# Patient Record
Sex: Female | Born: 1955 | Race: White | Hispanic: No | Smoking: Former smoker
Health system: Southern US, Community
[De-identification: ages and names within clinical notes are randomized; demographics above are authoritative.]

## PROBLEM LIST (undated history)

## (undated) DIAGNOSIS — F419 Anxiety disorder, unspecified: Secondary | ICD-10-CM

## (undated) DIAGNOSIS — Z9889 Other specified postprocedural states: Secondary | ICD-10-CM

## (undated) DIAGNOSIS — E7800 Pure hypercholesterolemia, unspecified: Secondary | ICD-10-CM

## (undated) DIAGNOSIS — Z8489 Family history of other specified conditions: Secondary | ICD-10-CM

## (undated) DIAGNOSIS — Z87442 Personal history of urinary calculi: Secondary | ICD-10-CM

## (undated) DIAGNOSIS — G4730 Sleep apnea, unspecified: Secondary | ICD-10-CM

## (undated) DIAGNOSIS — K219 Gastro-esophageal reflux disease without esophagitis: Secondary | ICD-10-CM

## (undated) DIAGNOSIS — F32A Depression, unspecified: Secondary | ICD-10-CM

## (undated) DIAGNOSIS — R7303 Prediabetes: Secondary | ICD-10-CM

## (undated) DIAGNOSIS — I10 Essential (primary) hypertension: Secondary | ICD-10-CM

## (undated) DIAGNOSIS — M1990 Unspecified osteoarthritis, unspecified site: Secondary | ICD-10-CM

## (undated) DIAGNOSIS — T8859XA Other complications of anesthesia, initial encounter: Secondary | ICD-10-CM

## (undated) DIAGNOSIS — E039 Hypothyroidism, unspecified: Secondary | ICD-10-CM

## (undated) DIAGNOSIS — T4145XA Adverse effect of unspecified anesthetic, initial encounter: Secondary | ICD-10-CM

## (undated) DIAGNOSIS — M797 Fibromyalgia: Secondary | ICD-10-CM

## (undated) DIAGNOSIS — M719 Bursopathy, unspecified: Secondary | ICD-10-CM

## (undated) DIAGNOSIS — E079 Disorder of thyroid, unspecified: Secondary | ICD-10-CM

## (undated) DIAGNOSIS — R112 Nausea with vomiting, unspecified: Secondary | ICD-10-CM

## (undated) DIAGNOSIS — F329 Major depressive disorder, single episode, unspecified: Secondary | ICD-10-CM

## (undated) HISTORY — PX: TUBAL LIGATION: SHX77

## (undated) HISTORY — PX: NISSEN FUNDOPLICATION: SHX2091

## (undated) HISTORY — PX: CATARACT EXTRACTION, BILATERAL: SHX1313

## (undated) HISTORY — DX: Anxiety disorder, unspecified: F41.9

## (undated) HISTORY — DX: Depression, unspecified: F32.A

## (undated) HISTORY — PX: ABDOMINAL HYSTERECTOMY: SHX81

## (undated) HISTORY — DX: Major depressive disorder, single episode, unspecified: F32.9

## (undated) HISTORY — PX: NECK SURGERY: SHX720

## (undated) HISTORY — PX: CHOLECYSTECTOMY: SHX55

---

## 1999-09-04 ENCOUNTER — Encounter (INDEPENDENT_AMBULATORY_CARE_PROVIDER_SITE_OTHER): Payer: Self-pay

## 1999-09-04 ENCOUNTER — Other Ambulatory Visit: Admission: RE | Admit: 1999-09-04 | Discharge: 1999-09-04 | Payer: Self-pay | Admitting: Gastroenterology

## 1999-11-20 ENCOUNTER — Ambulatory Visit (HOSPITAL_COMMUNITY): Admission: RE | Admit: 1999-11-20 | Discharge: 1999-11-20 | Payer: Self-pay | Admitting: Gastroenterology

## 2000-01-04 ENCOUNTER — Ambulatory Visit (HOSPITAL_COMMUNITY): Admission: RE | Admit: 2000-01-04 | Discharge: 2000-01-04 | Payer: Self-pay | Admitting: Surgery

## 2000-01-04 ENCOUNTER — Encounter: Payer: Self-pay | Admitting: Surgery

## 2000-01-26 ENCOUNTER — Encounter: Payer: Self-pay | Admitting: Surgery

## 2000-01-30 ENCOUNTER — Inpatient Hospital Stay (HOSPITAL_COMMUNITY): Admission: RE | Admit: 2000-01-30 | Discharge: 2000-02-01 | Payer: Self-pay | Admitting: Surgery

## 2000-04-03 ENCOUNTER — Encounter: Payer: Self-pay | Admitting: Surgery

## 2000-04-03 ENCOUNTER — Ambulatory Visit (HOSPITAL_COMMUNITY): Admission: RE | Admit: 2000-04-03 | Discharge: 2000-04-03 | Payer: Self-pay | Admitting: Surgery

## 2008-09-29 ENCOUNTER — Ambulatory Visit: Payer: Self-pay | Admitting: Specialist

## 2008-12-09 ENCOUNTER — Ambulatory Visit (HOSPITAL_COMMUNITY): Admission: RE | Admit: 2008-12-09 | Discharge: 2008-12-10 | Payer: Self-pay | Admitting: Neurological Surgery

## 2009-01-04 ENCOUNTER — Encounter: Admission: RE | Admit: 2009-01-04 | Discharge: 2009-01-04 | Payer: Self-pay | Admitting: Neurological Surgery

## 2010-07-22 ENCOUNTER — Ambulatory Visit: Payer: Self-pay | Admitting: Rheumatology

## 2010-08-22 ENCOUNTER — Ambulatory Visit: Payer: Self-pay | Admitting: Pain Medicine

## 2010-11-23 DIAGNOSIS — E039 Hypothyroidism, unspecified: Secondary | ICD-10-CM | POA: Insufficient documentation

## 2011-01-08 DIAGNOSIS — I10 Essential (primary) hypertension: Secondary | ICD-10-CM | POA: Insufficient documentation

## 2011-02-26 LAB — DIFFERENTIAL
Eosinophils Absolute: 0.1 10*3/uL (ref 0.0–0.7)
Eosinophils Relative: 2 % (ref 0–5)
Lymphs Abs: 2.3 10*3/uL (ref 0.7–4.0)
Monocytes Absolute: 0.5 10*3/uL (ref 0.1–1.0)
Monocytes Relative: 8 % (ref 3–12)

## 2011-02-26 LAB — BASIC METABOLIC PANEL
CO2: 29 mEq/L (ref 19–32)
Chloride: 104 mEq/L (ref 96–112)
GFR calc Af Amer: >60 mL/min (ref >60)
Potassium: 5.1 mEq/L (ref 3.5–5.1)

## 2011-02-26 LAB — CBC
HCT: 44.5 % (ref 36.0–46.0)
Hemoglobin: 14.8 g/dL (ref 12.0–15.0)
MCHC: 33.2 g/dL (ref 30.0–36.0)
MCV: 93 fL (ref 78.0–100.0)
RBC: 4.78 MIL/uL (ref 3.87–5.11)
WBC: 6.6 10*3/uL (ref 4.0–10.5)

## 2011-02-26 LAB — PROTIME-INR: INR: 0.9 (ref 0.00–1.49)

## 2011-03-27 NOTE — Op Note (Signed)
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   PROCEDURE:  1. Anterior cervical re-exploration with removal of anterior cervical      plating C5 to C7 with exploration and fusion C5-6, C6-7.  2. Decompression of anterior cervical diskectomy C4-5 for central      canal and nerve root decompression.  3. Anterior cervical arthrodesis C4-5 utilizing a 7-mm      corticocancellous allograft.  4. Anterior cervical plating C4-5 utilizing Atlantis Vision plate.   SURGEON:  Tia Alert, MD   ASSISTANT:  Reinaldo Meeker, MD   ANESTHESIA:  General endotracheal.   COMPLICATIONS:  None apparent.   INDICATIONS FOR PROCEDURE:  Evelyn Bentley is a 55 year old female who  presented with severe neck pain with numbness in her arms.  She had MRI,  which showed she had a previous fusion at C5-6 and C6-7 by another  physician in the remote past.  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  I recommended a cervical  reexploration with a removal of the old plate followed by decompressive  anterior cervical diskectomy, fusion plating at C4-5.  She understood  the risks, benefits, and expected outcome and wished to proceed.   DESCRIPTION OF PROCEDURE:  The patient was taken to the operating room.  After induction of adequate generalized endotracheal  anesthesia, she was  placed in the supine position on the operating room table.  Her left  anterior cervical region was prepped with DuraPrep and then draped in  the usual sterile fashion.  A 5 mL of local anesthesia was injected, and  a transverse incision was made to the left of midline and carried down  to the platysma, which was elevated, opened, and undermined with  Metzenbaum scissors.  I then dissected a plane medial to the  sternocleidomastoid muscle and internal carotid artery and lateral to  the trachea and esophagus to expose C4-5 and the plate.  Blunt  dissection was used to get the soft tissues off of this anterior  cervical plate and locking screws were loosened and each of the 6 screws  were removed from the plate and the plate was then removed from C5 to C7  and the old fusion was explored.  To ensure solid fusion this appeared  to be a solid arthrodesis.  We then took down the longus colli muscles  and exposed  C4-5.  Checked this under fluoroscopy to confirm our level  at C4-5 and incised the disk space to perform the initial diskectomy  with pituitary rongeurs and curved curettes.  We then used the high-  speed drill to drill the endplates.  We drilled in a rectangular fashion  to prepare for later arthrodesis and to widen the disk space to 7 mm.  We drilled down to the level of the posterior spurs and then brought in  the operating microscope.  We opened the posterior longitudinal ligament  with a nerve hook, and then removed the posterior longitudinal ligament  while undercutting the bodies of C4 and C5 removing the posterior  osteophytes as we marched along the inferior plate of C4 and the  superior endplate of C5.  Bilateral foraminotomies were performed and C5  nerve roots were identified and decompressed distally in their foramina.  We could see the cord pulsatile through the dura.  We angled the scope  up and down to look up under the vertebral artery as best as we  could  and get them well decompressed under cutting the vertebral bodies.  We  then palpated in circumferential fashion with a nerve hook to assure  adequate  decompression.  We then irrigated with saline solution and  inspected surgical bed once again and then measured interspace to be 7  mm.  I used 7-mm corticocancellous allograft and tapped this into  position at C4-5.  We then used Atlantis Vision plate placed two 13-mm  variable angle screws in the bodies of C4 and C5 and these locked these  into position by locking mechanism on the plate.  We then irrigated with  saline solution containing bacitracin throughout all bleeding points,  and once meticulous hemostasis was achieved, closed the platysma with 3-  0 Vicryl, closing the subcuticular tissue with 3-0 Vicryl, and closed  the skin with Benzoin and Steri-Strips.  The drapes were removed.  A  sterile dressing was applied.  The patient was awakened from general  anesthesia and transferred to recovery room in stable condition.  At the  end of procedure, all sponge, needle, and instrument counts were  correct.      Tia Alert, MD  Electronically Signed     DSJ/MEDQ  D:  12/09/2008  T:  12/10/2008  Job:  [id number]

## 2011-03-30 NOTE — Procedures (Signed)
[place]  Patient:    Evelyn Bentley, Evelyn Bentley                        MRN: [id number] Proc. Date: 11/22/99 Attending:  Vania Rea. Jarold Motto, M.D. [facility] CC:         [place]. Daphine Deutscher, M.D.             Vania Rea. Jarold Motto, M.D. [facility]                           Procedure Report  PROCEDURE:  Esophageal manometry.  FINDINGS: 1. Upper esophageal sphincter--there appears to be normal    coordination between pharyngeal contraction and    cricopharyngeal relaxation. 2. Lower esophageal sphincter--mean pressure was approximately 15 mmHg with    normal relaxation with swallowing. 3. Motility pattern #3--there is normal peristalsis throughout    the length of the esophagus to both wet and dry swallows with    mean esophageal amplitude of 140 mmHg.  ASSESSMENT:  This is a normal esophageal manometry except for borderline incompetent lower esophageal sphincter, and I think she would be a good candidate for fundoplication surgery. DD:  11/22/99 TD:  11/23/99 Job: [id number]

## 2012-04-02 ENCOUNTER — Ambulatory Visit: Payer: Self-pay | Admitting: Internal Medicine

## 2013-07-01 ENCOUNTER — Ambulatory Visit: Payer: Self-pay | Admitting: Internal Medicine

## 2013-09-18 ENCOUNTER — Emergency Department: Payer: Self-pay | Admitting: Emergency Medicine

## 2013-11-12 HISTORY — PX: OTHER SURGICAL HISTORY: SHX169

## 2014-06-25 ENCOUNTER — Ambulatory Visit: Payer: Self-pay | Admitting: Internal Medicine

## 2014-07-13 ENCOUNTER — Encounter: Payer: Self-pay | Admitting: Internal Medicine

## 2014-08-12 ENCOUNTER — Encounter: Payer: Self-pay | Admitting: Internal Medicine

## 2014-11-19 ENCOUNTER — Ambulatory Visit: Payer: Self-pay

## 2014-12-28 ENCOUNTER — Ambulatory Visit: Payer: Self-pay | Admitting: Family Medicine

## 2015-03-06 DIAGNOSIS — Z8639 Personal history of other endocrine, nutritional and metabolic disease: Secondary | ICD-10-CM

## 2015-03-06 DIAGNOSIS — M797 Fibromyalgia: Secondary | ICD-10-CM | POA: Insufficient documentation

## 2015-03-06 DIAGNOSIS — Z8739 Personal history of other diseases of the musculoskeletal system and connective tissue: Secondary | ICD-10-CM

## 2015-03-06 HISTORY — DX: Personal history of other diseases of the musculoskeletal system and connective tissue: Z87.39

## 2015-03-06 HISTORY — DX: Personal history of other endocrine, nutritional and metabolic disease: Z86.39

## 2015-04-18 ENCOUNTER — Other Ambulatory Visit: Payer: Self-pay

## 2015-04-18 ENCOUNTER — Encounter: Payer: Self-pay | Admitting: Licensed Clinical Social Worker

## 2015-04-18 ENCOUNTER — Ambulatory Visit: Payer: BLUE CROSS/BLUE SHIELD | Admitting: Licensed Clinical Social Worker

## 2015-04-18 DIAGNOSIS — F419 Anxiety disorder, unspecified: Secondary | ICD-10-CM | POA: Insufficient documentation

## 2015-04-18 DIAGNOSIS — F331 Major depressive disorder, recurrent, moderate: Secondary | ICD-10-CM | POA: Insufficient documentation

## 2015-04-18 DIAGNOSIS — F411 Generalized anxiety disorder: Secondary | ICD-10-CM | POA: Insufficient documentation
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
## 2015-04-21 ENCOUNTER — Ambulatory Visit: Payer: BLUE CROSS/BLUE SHIELD | Admitting: Psychiatry

## 2015-04-21 ENCOUNTER — Ambulatory Visit: Payer: Self-pay | Admitting: Psychiatry

## 2015-05-02 ENCOUNTER — Other Ambulatory Visit: Payer: Self-pay

## 2015-05-02 ENCOUNTER — Ambulatory Visit: Payer: BLUE CROSS/BLUE SHIELD | Admitting: Psychiatry

## 2015-05-02 DIAGNOSIS — Z8669 Personal history of other diseases of the nervous system and sense organs: Secondary | ICD-10-CM

## 2015-05-02 HISTORY — DX: Personal history of other diseases of the nervous system and sense organs: Z86.69

## 2015-05-17 ENCOUNTER — Ambulatory Visit
Admission: RE | Admit: 2015-05-17 | Discharge: 2015-05-17 | Disposition: A | Discharge status: Home / Self Care | Payer: BLUE CROSS/BLUE SHIELD | Source: Ambulatory Visit | Attending: Nurse Practitioner | Admitting: Nurse Practitioner

## 2015-05-17 ENCOUNTER — Other Ambulatory Visit: Payer: Self-pay | Admitting: Nurse Practitioner

## 2015-05-17 DIAGNOSIS — T1490XA Injury, unspecified, initial encounter: Secondary | ICD-10-CM

## 2015-05-17 DIAGNOSIS — W19XXXA Unspecified fall, initial encounter: Secondary | ICD-10-CM | POA: Insufficient documentation

## 2015-05-17 DIAGNOSIS — S0592XA Unspecified injury of left eye and orbit, initial encounter: Secondary | ICD-10-CM

## 2015-05-17 DIAGNOSIS — H5712 Ocular pain, left eye: Secondary | ICD-10-CM | POA: Insufficient documentation

## 2015-05-17 DIAGNOSIS — G44311 Acute post-traumatic headache, intractable: Secondary | ICD-10-CM

## 2015-06-24 ENCOUNTER — Other Ambulatory Visit: Payer: Self-pay | Admitting: Internal Medicine

## 2015-06-24 DIAGNOSIS — Z1231 Encounter for screening mammogram for malignant neoplasm of breast: Secondary | ICD-10-CM

## 2015-06-28 ENCOUNTER — Ambulatory Visit
Admission: RE | Admit: 2015-06-28 | Discharge: 2015-06-28 | Disposition: A | Discharge status: Home / Self Care | Payer: BLUE CROSS/BLUE SHIELD | Source: Ambulatory Visit | Attending: Internal Medicine | Admitting: Internal Medicine

## 2015-06-28 DIAGNOSIS — Z1231 Encounter for screening mammogram for malignant neoplasm of breast: Secondary | ICD-10-CM | POA: Diagnosis present

## 2015-08-01 ENCOUNTER — Encounter: Payer: Self-pay | Admitting: Emergency Medicine

## 2015-08-01 ENCOUNTER — Ambulatory Visit
Admission: EM | Admit: 2015-08-01 | Discharge: 2015-08-01 | Disposition: A | Discharge status: Home / Self Care | Payer: BLUE CROSS/BLUE SHIELD | Attending: Family Medicine | Admitting: Family Medicine

## 2015-08-01 DIAGNOSIS — J0100 Acute maxillary sinusitis, unspecified: Secondary | ICD-10-CM | POA: Diagnosis not present

## 2015-08-01 DIAGNOSIS — J40 Bronchitis, not specified as acute or chronic: Secondary | ICD-10-CM

## 2015-08-01 HISTORY — DX: Disorder of thyroid, unspecified: E07.9

## 2015-08-01 HISTORY — DX: Gastro-esophageal reflux disease without esophagitis: K21.9

## 2015-08-01 HISTORY — DX: Pure hypercholesterolemia, unspecified: E78.00

## 2015-08-01 HISTORY — DX: Essential (primary) hypertension: I10

## 2015-08-01 MED ORDER — LORATADINE-PSEUDOEPHEDRINE ER 10-240 MG PO TB24: 1.0000 | ORAL_TABLET | Freq: Every day | ORAL | Status: DC

## 2015-08-01 MED ORDER — AMOXICILLIN-POT CLAVULANATE 875-125 MG PO TABS: 1.0000 | ORAL_TABLET | Freq: Two times a day (BID) | ORAL | Status: DC

## 2015-08-01 MED ORDER — ALBUTEROL SULFATE HFA 108 (90 BASE) MCG/ACT IN AERS: 2.0000 | INHALATION_SPRAY | RESPIRATORY_TRACT | Status: DC | PRN

## 2015-08-01 MED ORDER — HYDROCOD POLST-CPM POLST ER 10-8 MG/5ML PO SUER: 5.0000 mL | Freq: Two times a day (BID) | ORAL | Status: DC | PRN

## 2015-08-01 NOTE — ED Notes (Signed)
Patient c/o cough, chest congestion, and bilateral ear pain for 3 days.  Patient denies fevers.

## 2015-08-01 NOTE — ED Provider Notes (Signed)
CSN: [id number]     Arrival date & time 08/01/15  0750 History   First MD Initiated Contact with Patient 08/01/15 [phone]     Chief Complaint  Patient presents with  . Cough   (Consider location/radiation/quality/duration/timing/severity/associated sxs/prior Treatment) Patient is a 59 y.o. female presenting with cough. The history is provided by the patient. No language interpreter was used.  Cough Cough characteristics:  Productive Sputum characteristics:  Yellow and green Severity:  Moderate Duration:  3 days Timing:  Unable to specify Progression:  Worsening Chronicity:  New Smoker: no   Context: upper respiratory infection   Context: not animal exposure, not fumes, not occupational exposure, not sick contacts and not smoke exposure   Relieved by:  Nothing Ineffective treatments: antihistamines. Associated symptoms: ear fullness, ear pain, rhinorrhea, sinus congestion, sore throat and wheezing   Associated symptoms: no chest pain, no diaphoresis, no eye discharge, no rash, no shortness of breath and no weight loss   Risk factors: no chemical exposure, no recent infection and no recent travel     Past Medical History  Diagnosis Date  . Anxiety   . Depression   . Hypertension   . GERD (gastroesophageal reflux disease)   . Hypercholesteremia   . Thyroid disease    Past Surgical History  Procedure Laterality Date  . Cholecystectomy    . Neck surgery     Family History  Problem Relation Age of Onset  . Depression Mother    Social History  Substance Use Topics  . Smoking status: Former Research scientist (life sciences)  . Smokeless tobacco: None  . Alcohol Use: Yes   OB History    No data available     Review of Systems  Constitutional: Negative for weight loss and diaphoresis.  HENT: Positive for ear pain, rhinorrhea and sore throat.   Eyes: Negative for discharge.  Respiratory: Positive for cough and wheezing. Negative for shortness of breath.   Cardiovascular: Negative for chest pain.   Skin: Negative for rash.  All other systems reviewed and are negative.  patient does not smoke now. Hypertension. Nurse's notes were reviewed. Allergies  Review of patient's allergies indicates no known allergies.  Home Medications   Prior to Admission medications   Medication Sig Start Date End Date Taking? Authorizing Provider  atorvastatin (LIPITOR) 10 MG tablet Take 20 mg by mouth daily.   Yes Historical Provider, MD  albuterol (PROVENTIL HFA;VENTOLIN HFA) 108 (90 BASE) MCG/ACT inhaler Inhale 2 puffs into the lungs every 4 (four) hours as needed for wheezing or shortness of breath. 08/01/15   Frederich Cha, MD  ALPRAZolam Duanne Moron) 0.25 MG tablet Take 1 tablet by mouth at bedtime. 02/17/15   Historical Provider, MD  amoxicillin-clavulanate (AUGMENTIN) 875-125 MG per tablet Take 1 tablet by mouth 2 (two) times daily. 08/01/15   Frederich Cha, MD  ARIPiprazole (ABILIFY) 2 MG tablet Take 1 tablet by mouth daily. 02/17/15   Historical Provider, MD  chlorpheniramine-HYDROcodone (TUSSIONEX PENNKINETIC ER) 10-8 MG/5ML SUER Take 5 mLs by mouth every 12 (twelve) hours as needed for cough. 08/01/15   Frederich Cha, MD  escitalopram (LEXAPRO) 20 MG tablet Take 1 tablet by mouth every morning. 02/17/15   Historical Provider, MD  levothyroxine (SYNTHROID, LEVOTHROID) 125 MCG tablet  04/16/15   Historical Provider, MD  levothyroxine (SYNTHROID, LEVOTHROID) 150 MCG tablet Take by mouth.    Historical Provider, MD  lisinopril (PRINIVIL,ZESTRIL) 20 MG tablet Take by mouth.    Historical Provider, MD  loratadine-pseudoephedrine (CLARITIN-D 24 HOUR) 10-240 MG per  24 hr tablet Take 1 tablet by mouth daily. 08/01/15   Frederich Cha, MD  Omeprazole 20 MG TBEC Take by mouth.    Historical Provider, MD  topiramate (TOPAMAX) 50 MG tablet Take by mouth. 04/07/15   Historical Provider, MD   Meds Ordered and Administered this Visit  Medications - No data to display  BP 119/67 mmHg  Pulse 68  Temp(Src) 98 F (36.7 C) (Oral)  Resp  16  Ht 5' (1.524 m)  Wt 160 lb (72.576 kg)  BMI 31.25 kg/m2  SpO2 100% No data found.   Physical Exam  Constitutional: She is oriented to person, place, and time. She appears well-developed and well-nourished.  HENT:  Head: Normocephalic and atraumatic.  Right Ear: Hearing, tympanic membrane and external ear normal.  Left Ear: Hearing normal. Tympanic membrane is bulging.  Nose: Mucosal edema and rhinorrhea present. Right sinus exhibits maxillary sinus tenderness. Right sinus exhibits no frontal sinus tenderness. Left sinus exhibits maxillary sinus tenderness. Left sinus exhibits no frontal sinus tenderness.  Mouth/Throat: Normal dentition. No dental caries. Posterior oropharyngeal erythema present. No posterior oropharyngeal edema.  Eyes: Conjunctivae are normal. Pupils are equal, round, and reactive to light.  Neck: Normal range of motion. Neck supple. No tracheal deviation present.  Cardiovascular: Normal rate and regular rhythm.   Pulmonary/Chest: Effort normal and breath sounds normal. No respiratory distress.  Musculoskeletal: Normal range of motion.  Neurological: She is alert and oriented to person, place, and time. No cranial nerve deficit.  Skin: Skin is warm and dry.  Psychiatric: She has a normal mood and affect. Her behavior is normal.  Vitals reviewed.   ED Course  Procedures (including critical care time)  Labs Review Labs Reviewed - No data to display  Imaging Review No results found.   Visual Acuity Review  Right Eye Distance:   Left Eye Distance:   Bilateral Distance:    Right Eye Near:   Left Eye Near:    Bilateral Near:         MDM   1. Acute maxillary sinusitis, recurrence not specified   2. Bronchitis    Due to the progression of her illness is her on anabiotic. Augmentin 875 for 10 days Tussionex 1 teaspoon twice a day change Claritin to Claritin-D and for the wheezing albuterol inhaler to use as needed. Follow-up PCP in a week if not  better work note for today and tomorrow.   Frederich Cha, MD 08/01/15 [phone]

## 2015-08-01 NOTE — Discharge Instructions (Signed)
Upper Respiratory Infection, Adult An upper respiratory infection (URI) is also known as the common cold. It is often caused by a type of germ (virus). Colds are easily spread (contagious). You can pass it to others by kissing, coughing, sneezing, or drinking out of the same glass. Usually, you get better in 1 or 2 weeks.  HOME CARE   Only take medicine as told by your doctor.  Use a warm mist humidifier or breathe in steam from a hot shower.  Drink enough water and fluids to keep your pee (urine) clear or pale yellow.  Get plenty of rest.  Return to work when your temperature is back to normal or as told by your doctor. You may use a face mask and wash your hands to stop your cold from spreading. GET HELP RIGHT AWAY IF:   After the first few days, you feel you are getting worse.  You have questions about your medicine.  You have chills, shortness of breath, or brown or red spit (mucus).  You have yellow or brown snot (nasal discharge) or pain in the face, especially when you bend forward.  You have a fever, puffy (swollen) neck, pain when you swallow, or white spots in the back of your throat.  You have a bad headache, ear pain, sinus pain, or chest pain.  You have a high-pitched whistling sound when you breathe in and out (wheezing).  You have a lasting cough or cough up blood.  You have sore muscles or a stiff neck. MAKE SURE YOU:   Understand these instructions.  Will watch your condition.  Will get help right away if you are not doing well or get worse. Document Released: 04/16/2008 Document Revised: 01/21/2012 Document Reviewed: 02/03/2014 [facility] Patient Information 2015 [place], [place]. This information is not intended to replace advice given to you by your health care provider. Make sure you discuss any questions you have with your health care provider.  General Headache Without Cause A headache is pain or discomfort felt around the head or neck area. The specific  cause of a headache may not be found. There are many causes and types of headaches. A few common ones are:  Tension headaches.  Migraine headaches.  Cluster headaches.  Chronic daily headaches. HOME CARE INSTRUCTIONS   Keep all follow-up appointments with your caregiver or any specialist referral.  Only take over-the-counter or prescription medicines for pain or discomfort as directed by your caregiver.  Lie down in a dark, quiet room when you have a headache.  Keep a headache journal to find out what may trigger your migraine headaches. For example, write down:  What you eat and drink.  How much sleep you get.  Any change to your diet or medicines.  Try massage or other relaxation techniques.  Put ice packs or heat on the head and neck. Use these 3 to 4 times per day for 15 to 20 minutes each time, or as needed.  Limit stress.  Sit up straight, and do not tense your muscles.  Quit smoking if you smoke.  Limit alcohol use.  Decrease the amount of caffeine you drink, or stop drinking caffeine.  Eat and sleep on a regular schedule.  Get 7 to 9 hours of sleep, or as recommended by your caregiver.  Keep lights dim if bright lights bother you and make your headaches worse. SEEK MEDICAL CARE IF:   You have problems with the medicines you were prescribed.  Your medicines are not working.  You have a change from the usual headache.  You have nausea or vomiting. SEEK IMMEDIATE MEDICAL CARE IF:   Your headache becomes severe.  You have a fever.  You have a stiff neck.  You have loss of vision.  You have muscular weakness or loss of muscle control.  You start losing your balance or have trouble walking.  You feel faint or pass out.  You have severe symptoms that are different from your first symptoms. MAKE SURE YOU:   Understand these instructions.  Will watch your condition.  Will get help right away if you are not doing well or get worse. Document  Released: 10/29/2005 Document Revised: 01/21/2012 Document Reviewed: 11/14/2011 [facility] Patient Information 2015 [place], [place]. This information is not intended to replace advice given to you by your health care provider. Make sure you discuss any questions you have with your health care provider.  Sinusitis Sinusitis is redness, soreness, and puffiness (inflammation) of the air pockets in the bones of your face (sinuses). The redness, soreness, and puffiness can cause air and mucus to get trapped in your sinuses. This can allow germs to grow and cause an infection.  HOME CARE   Drink enough fluids to keep your pee (urine) clear or pale yellow.  Use a humidifier in your home.  Run a hot shower to create steam in the bathroom. Sit in the bathroom with the door closed. Breathe in the steam 3-4 times a day.  Put a warm, moist washcloth on your face 3-4 times a day, or as told by your doctor.  Use salt water sprays (saline sprays) to wet the thick fluid in your nose. This can help the sinuses drain.  Only take medicine as told by your doctor. GET HELP RIGHT AWAY IF:   Your pain gets worse.  You have very bad headaches.  You are sick to your stomach (nauseous).  You throw up (vomit).  You are very sleepy (drowsy) all the time.  Your face is puffy (swollen).  Your vision changes.  You have a stiff neck.  You have trouble breathing. MAKE SURE YOU:   Understand these instructions.  Will watch your condition.  Will get help right away if you are not doing well or get worse. Document Released: 04/16/2008 Document Revised: 07/23/2012 Document Reviewed: 06/03/2012 [facility] Patient Information 2015 [place], [place]. This information is not intended to replace advice given to you by your health care provider. Make sure you discuss any questions you have with your health care provider. Acute Bronchitis Bronchitis is when the airways that extend from the windpipe into the lungs get  red, puffy, and painful (inflamed). Bronchitis often causes thick spit (mucus) to develop. This leads to a cough. A cough is the most common symptom of bronchitis. In acute bronchitis, the condition usually begins suddenly and goes away over time (usually in 2 weeks). Smoking, allergies, and asthma can make bronchitis worse. Repeated episodes of bronchitis may cause more lung problems. HOME CARE  Rest.  Drink enough fluids to keep your pee (urine) clear or pale yellow (unless you need to limit fluids as told by your doctor).  Only take over-the-counter or prescription medicines as told by your doctor.  Avoid smoking and secondhand smoke. These can make bronchitis worse. If you are a smoker, think about using nicotine gum or skin patches. Quitting smoking will help your lungs heal faster.  Reduce the chance of getting bronchitis again by:  Washing your hands often.  Avoiding people with  cold symptoms.  Trying not to touch your hands to your mouth, nose, or eyes.  Follow up with your doctor as told. GET HELP IF: Your symptoms do not improve after 1 week of treatment. Symptoms include:  Cough.  Fever.  Coughing up thick spit.  Body aches.  Chest congestion.  Chills.  Shortness of breath.  Sore throat. GET HELP RIGHT AWAY IF:   You have an increased fever.  You have chills.  You have severe shortness of breath.  You have bloody thick spit (sputum).  You throw up (vomit) often.  You lose too much body fluid (dehydration).  You have a severe headache.  You faint. MAKE SURE YOU:   Understand these instructions.  Will watch your condition.  Will get help right away if you are not doing well or get worse. Document Released: 04/16/2008 Document Revised: 07/01/2013 Document Reviewed: 04/21/2013 [facility] Patient Information 2015 [place], [place]. This information is not intended to replace advice given to you by your health care provider. Make sure you discuss any  questions you have with your health care provider.

## 2015-08-18 ENCOUNTER — Ambulatory Visit
Admission: EM | Admit: 2015-08-18 | Discharge: 2015-08-18 | Disposition: A | Discharge status: Home / Self Care | Payer: BLUE CROSS/BLUE SHIELD | Attending: Family Medicine | Admitting: Family Medicine

## 2015-08-18 DIAGNOSIS — N390 Urinary tract infection, site not specified: Secondary | ICD-10-CM

## 2015-08-18 LAB — URINALYSIS COMPLETE WITH MICROSCOPIC (ARMC ONLY)
Bilirubin Urine: NEGATIVE
Glucose, UA: NEGATIVE mg/dL
KETONES UR: NEGATIVE mg/dL
Nitrite: NEGATIVE
PH: 6 (ref 5.0–8.0)
PROTEIN: NEGATIVE mg/dL
Specific Gravity, Urine: 1.01 (ref 1.005–1.030)

## 2015-08-18 MED ORDER — NITROFURANTOIN MONOHYD MACRO 100 MG PO CAPS: 100.0000 mg | ORAL_CAPSULE | Freq: Two times a day (BID) | ORAL | Status: DC

## 2015-08-18 NOTE — ED Notes (Signed)
Pt states "I feel like I'm not voiding hardly at all, for the last three days."

## 2015-08-18 NOTE — ED Notes (Signed)
"  Pain when I do void."

## 2015-08-18 NOTE — Discharge Instructions (Signed)
Take medication as prescribed. Rest. Drink water. Eat and drink regularly. Follow up with your primary care physician as needed. Return to Urgent care for new or worsening concerns.   Urinary Tract Infection A urinary tract infection (UTI) can occur any place along the urinary tract. The tract includes the kidneys, ureters, bladder, and urethra. A type of germ called bacteria often causes a UTI. UTIs are often helped with antibiotic medicine.  HOME CARE   If given, take antibiotics as told by your doctor. Finish them even if you start to feel better.  Drink enough fluids to keep your pee (urine) clear or pale yellow.  Avoid tea, drinks with caffeine, and bubbly (carbonated) drinks.  Pee often. Avoid holding your pee in for a long time.  Pee before and after having sex (intercourse).  Wipe from front to back after you poop (bowel movement) if you are a woman. Use each tissue only once. GET HELP RIGHT AWAY IF:   You have back pain.  You have lower belly (abdominal) pain.  You have chills.  You feel sick to your stomach (nauseous).  You throw up (vomit).  Your burning or discomfort with peeing does not go away.  You have a fever.  Your symptoms are not better in 3 days. MAKE SURE YOU:   Understand these instructions.  Will watch your condition.  Will get help right away if you are not doing well or get worse.   This information is not intended to replace advice given to you by your health care provider. Make sure you discuss any questions you have with your health care provider.   Document Released: 04/16/2008 Document Revised: 11/19/2014 Document Reviewed: 05/29/2012 Elsevier Interactive Patient Education [employer].

## 2015-08-18 NOTE — ED Provider Notes (Signed)
[facility] Emergency Department Provider Note  ____________________________________________  Time seen: Approximately 7:57 AM  I have reviewed the triage vital signs and the nursing notes.   HISTORY  Chief Complaint Urinary Retention   HPI Evelyn Bentley is a 59 y.o. female presents for complaints of urinary frequency, urgency, burning with urination. Patient states she feels that she often can not fully empty her bladder as she has the urge to urinate again soon. States occasional low back discomfort described as an ache but not a pain. States these symptoms present x 3 days. States hx of UTI in past and feels similar.   Denies fever, nausea, vomiting, diarrhea, constipation, abdominal pain, chest pain, shortness of breath or other complaints.  Reports continues to eat and drink well.    Past Medical History  Diagnosis Date  . Anxiety   . Depression   . Hypertension   . GERD (gastroesophageal reflux disease)   . Hypercholesteremia   . Thyroid disease     Patient Active Problem List   Diagnosis Date Noted  . History of migraine headaches 05/02/2015  . Anxiety 04/18/2015  . Depression, major, recurrent, moderate ([place]) 04/18/2015  . H/O: hypothyroidism 03/06/2015  . Fibromyalgia 03/06/2015  . H/O arthritis 03/06/2015    Past Surgical History  Procedure Laterality Date  . Cholecystectomy    . Neck surgery      Current Outpatient Rx  Name  Route  Sig  Dispense  Refill  .           .           . ARIPiprazole (ABILIFY) 2 MG tablet   Oral   Take 1 tablet by mouth daily.         Marland Kitchen atorvastatin (LIPITOR) 10 MG tablet   Oral   Take 20 mg by mouth daily.         Marland Kitchen escitalopram (LEXAPRO) 20 MG tablet   Oral   Take 1 tablet by mouth every morning.         Marland Kitchen levothyroxine (SYNTHROID, LEVOTHROID) 125 MCG tablet            1   . lisinopril (PRINIVIL,ZESTRIL) 20 MG tablet   Oral   Take by mouth.         .           . Omeprazole  20 MG TBEC   Oral   Take by mouth.         .           .           .           .             Allergies Review of patient's allergies indicates no known allergies.  Family History  Problem Relation Age of Onset  . Depression Mother     Social History Social History  Substance Use Topics  . Smoking status: Former Research scientist (life sciences)  . Smokeless tobacco: None  . Alcohol Use: Yes    Review of Systems Constitutional: No fever/chills Eyes: No visual changes. ENT: No sore throat. Cardiovascular: Denies chest pain. Respiratory: Denies shortness of breath. Gastrointestinal: No abdominal pain.  No nausea, no vomiting.  No diarrhea.  No constipation. Genitourinary: positive for dysuria. Musculoskeletal: Negative for back pain. Skin: Negative for rash. Neurological: Negative for headaches, focal weakness or numbness.  10-point ROS otherwise negative.  ____________________________________________   PHYSICAL EXAM:  VITAL SIGNS:  ED Triage Vitals  Enc Vitals Group     BP 08/18/15 0742 131/68 mmHg     Pulse Rate 08/18/15 0742 80     Resp 08/18/15 0742 16     Temp 08/18/15 0742 97.8 F (36.6 C)     Temp Source 08/18/15 0742 Oral     SpO2 08/18/15 0742 100 %     Weight 08/18/15 0742 158 lb (71.668 kg)     Height 08/18/15 0742 5' (1.524 m)     Head Cir --      Peak Flow --      Pain Score 08/18/15 0746 2     Pain Loc --      Pain Edu? --      Excl. in [place]? --     Constitutional: Alert and oriented. Well appearing and in no acute distress. Eyes: Conjunctivae are normal. PERRL. EOMI. Head: Atraumatic.  Nose: No congestion/rhinnorhea.  Mouth/Throat: Mucous membranes are moist.  Neck: No stridor.  No cervical spine tenderness to palpation. Hematological/Lymphatic/Immunilogical: No cervical lymphadenopathy. Cardiovascular: Normal rate, regular rhythm. Grossly normal heart sounds.  Good peripheral circulation. Respiratory: Normal respiratory effort.  No retractions. Lungs  CTAB. Gastrointestinal: Soft and nontender. No distention. Normal Bowel sounds.  No abdominal bruits. No CVA tenderness. Musculoskeletal: No lower or upper extremity tenderness nor edema.  No cervical, thoracic or lumbar TTP. Changes positions quickly without difficulty or distress. Steady gait.  Neurologic:  Normal speech and language. No gross focal neurologic deficits are appreciated. No gait instability. Skin:  Skin is warm, dry and intact. No rash noted. Psychiatric: Mood and affect are normal. Speech and behavior are normal.  ____________________________________________   LABS (all labs ordered are listed, but only abnormal results are displayed)  Labs Reviewed  URINALYSIS COMPLETEWITH MICROSCOPIC ([place]) - Abnormal; Notable for the following:    Color, Urine STRAW (*)    APPearance HAZY (*)    Hgb urine dipstick 2+ (*)    Leukocytes, UA 3+ (*)    Bacteria, UA MANY (*)    Squamous Epithelial / LPF 0-5 (*)    All other components within normal limits  URINE CULTURE     INITIAL IMPRESSION / ASSESSMENT AND PLAN / ED COURSE  Pertinent labs & imaging results that were available during my care of the patient were reviewed by me and considered in my medical decision making (see chart for details).  Very well appearing. No acute distress. Presents for x 3 days of dysuria. Abdomen soft and nontender, no CVA tenderness. Reports continues to eat and drink well. Will evaluate urine. Reports postmenopausal.  Urinalysis positive for many bacteria, too numerus to count WBCs, 3+ leukocytes, and hazy. Will treat urinary tract infection with macrobid, and supportive treatments including rest and fluids. Discussed follow up with Primary care physician this week. Discussed follow up and return parameters including no resolution or any worsening concerns. Patient verbalized understanding and agreed to plan.   ___________________________________________   FINAL CLINICAL IMPRESSION(S) / ED  DIAGNOSES  Final diagnoses:  UTI (lower urinary tract infection)       Marylene Land, NP 08/18/15 [phone]

## 2015-08-20 LAB — URINE CULTURE: Culture: >=100000

## 2015-11-15 ENCOUNTER — Ambulatory Visit (INDEPENDENT_AMBULATORY_CARE_PROVIDER_SITE_OTHER): Payer: BLUE CROSS/BLUE SHIELD | Admitting: Psychiatry

## 2015-11-15 ENCOUNTER — Encounter: Payer: Self-pay | Admitting: Psychiatry

## 2015-11-15 VITALS — BP 122/78 | HR 94 | Temp 97.4°F | Ht 60.0 in | Wt 166.0 lb

## 2015-11-15 DIAGNOSIS — F4329 Adjustment disorder with other symptoms: Secondary | ICD-10-CM

## 2015-11-15 DIAGNOSIS — F4321 Adjustment disorder with depressed mood: Secondary | ICD-10-CM | POA: Diagnosis not present

## 2015-11-15 DIAGNOSIS — F331 Major depressive disorder, recurrent, moderate: Secondary | ICD-10-CM | POA: Diagnosis not present

## 2015-11-15 DIAGNOSIS — Z634 Disappearance and death of family member: Secondary | ICD-10-CM

## 2015-11-15 MED ORDER — ESCITALOPRAM OXALATE 20 MG PO TABS: 20.0000 mg | ORAL_TABLET | ORAL | Status: DC

## 2015-11-15 MED ORDER — ALPRAZOLAM 0.25 MG PO TABS: 0.2500 mg | ORAL_TABLET | Freq: Every day | ORAL | Status: DC

## 2015-11-15 MED ORDER — TOPIRAMATE 50 MG PO TABS: 50.0000 mg | ORAL_TABLET | Freq: Every day | ORAL | Status: DC

## 2015-11-15 MED ORDER — ARIPIPRAZOLE 2 MG PO TABS: 2.0000 mg | ORAL_TABLET | Freq: Every day | ORAL | Status: DC

## 2015-11-15 NOTE — Progress Notes (Signed)
BH MD/PA/NP OP Progress Note  11/15/2015 3:43 PM Evelyn Bentley  MRN:  [id number]  Subjective:    Patient is a 60 year old widowed female who presented for the follow-up appointment. She reported that she was feeling depressed on the holidays due to the death of her husband and son couple of years ago. She felt it more around the holidays. She spent the holidays with her other son and his family. She appeared depressed during the interview. She reported that she has been spending time during crochet and with her family members. She reported that she used to go to the gym but now she is feeling tired. She is planning to start doing it again. Patient reported that her thyroid disease was not under control and now her endocrinologist is adjusting her medications. She takes alprazolam at bedtime to help with the sleep. Patient currently denied having any suicidal ideations or plans. She reported that she takes Abilify in the morning. She also takes Lexapro on a regular basis. She appeared well-groomed as usual. She denied having any perceptual disturbances. She denied use of drugs or alcohol at this time.   Chief Complaint:  Chief Complaint    Follow-up; Medication Refill     Visit Diagnosis:     ICD-9-CM ICD-10-CM   1. MDD (major depressive disorder), recurrent episode, moderate (HCC) 296.32 F33.1   2. 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 Anxiety   . Depression   . Hypertension   . GERD (gastroesophageal reflux disease)   . Hypercholesteremia   . Thyroid disease     Past Surgical History  Procedure Laterality Date  . Cholecystectomy    . Neck surgery     Family History:  Family History  Problem Relation Age of Onset  . Depression Mother    Social History:  Social History   Social History  . Marital Status: Married    Spouse Name: N/A  . Number of Children: N/A  . Years of Education: N/A   Social History  Main Topics  . Smoking status: Former Smoker    Start date: 11/14/1974    Quit date: 05/14/2014  . Smokeless tobacco: Never Used  . Alcohol Use: 0.0 - 0.6 oz/week    0 Standard drinks or equivalent, 0-1 Glasses of wine, 0 Cans of beer, 0 Shots of liquor per week  . Drug Use: No  . Sexual Activity: Not Currently   Other Topics Concern  . None   Social History Narrative   Additional History:   Patient is currently widowed and is living with her family members.  Assessment:   Musculoskeletal: Strength & Muscle Tone: within normal limits Gait & Station: normal Patient leans: N/A  Psychiatric Specialty Exam: HPI  ROS  Blood pressure 122/78, pulse 94, temperature 97.4 F (36.3 C), temperature source Tympanic, height 5' (1.524 m), weight 166 lb (75.297 kg), SpO2 97 %.Body mass index is 32.42 kg/(m^2).  General Appearance: Casual  Eye Contact:  Fair  Speech:  Clear and Coherent and Normal Rate  Volume:  Normal  Mood:  Anxious and Depressed  Affect:  Appropriate  Thought Process:  Coherent and Goal Directed  Orientation:  Full (Time, Place, and Person)  Thought Content:  WDL  Suicidal Thoughts:  No  Homicidal Thoughts:  No  Memory:  Immediate;   Fair  Judgement:  Fair  Insight:  Fair  Psychomotor Activity:  Normal  Concentration:  Fair  Recall:  Smiley Houseman of Knowledge: Fair  Language: Fair  Akathisia:  No  Handed:  Right  AIMS (if indicated):    Assets:  Communication Skills Desire for Improvement Physical Health Social Support  ADL's:  Intact  Cognition: WNL  Sleep:     Is the patient at risk to self?  No. Has the patient been a risk to self in the past 6 months?  No. Has the patient been a risk to self within the distant past?  No. Is the patient a risk to others?  No. Has the patient been a risk to others in the past 6 months?  No. Has the patient been a risk to others within the distant past?  No.  Current Medications: Current Outpatient Prescriptions   Medication Sig Dispense Refill  . albuterol (PROVENTIL HFA;VENTOLIN HFA) 108 (90 BASE) MCG/ACT inhaler Inhale 2 puffs into the lungs every 4 (four) hours as needed for wheezing or shortness of breath. 1 Inhaler 1  . ALPRAZolam (XANAX) 0.25 MG tablet Take 1 tablet by mouth at bedtime.    Marland Kitchen amoxicillin-clavulanate (AUGMENTIN) 875-125 MG per tablet Take 1 tablet by mouth 2 (two) times daily. 20 tablet 0  . ARIPiprazole (ABILIFY) 2 MG tablet Take 1 tablet by mouth daily.    Marland Kitchen atorvastatin (LIPITOR) 10 MG tablet Take 20 mg by mouth daily.    Marland Kitchen escitalopram (LEXAPRO) 20 MG tablet Take 1 tablet by mouth every morning.    . fluticasone (FLONASE) 50 MCG/ACT nasal spray Place into the nose.    . gabapentin (NEURONTIN) 300 MG capsule Take by mouth.    . levothyroxine (SYNTHROID, LEVOTHROID) 150 MCG tablet Take by mouth.    Marland Kitchen lisinopril (PRINIVIL,ZESTRIL) 20 MG tablet Take by mouth.    Marland Kitchen lisinopril-hydrochlorothiazide (PRINZIDE,ZESTORETIC) 10-12.5 MG tablet   2  . loratadine-pseudoephedrine (CLARITIN-D 24 HOUR) 10-240 MG per 24 hr tablet Take 1 tablet by mouth daily. 30 tablet 0  . meloxicam (MOBIC) 15 MG tablet Take by mouth.    . Omeprazole 20 MG TBEC Take by mouth.    . oxyCODONE-acetaminophen (PERCOCET/ROXICET) 5-325 MG tablet Take by mouth.    . topiramate (TOPAMAX) 50 MG tablet Take by mouth.    . Vitamin D, Ergocalciferol, (DRISDOL) 50000 units CAPS capsule   2   No current facility-administered medications for this visit.    Medical Decision Making:  Review of Psycho-Social Stressors (1)  Treatment Plan Summary:Medication management  She will continue on Lexapro 20 mg daily She will continue on Abilify 2 mg at bedtime She will continue on alprazolam 0.25 mg at bedtime Patient was given 3 month supply of the medications  Discussed with her about the adverse effects of the medication in detail and she demonstrated understanding Follow-up in 3 months or earlier    Rainey Pines, MD     11/15/2015, 3:43 PM

## 2016-02-14 ENCOUNTER — Encounter: Payer: Self-pay | Admitting: Psychiatry

## 2016-02-14 ENCOUNTER — Ambulatory Visit (INDEPENDENT_AMBULATORY_CARE_PROVIDER_SITE_OTHER): Payer: BLUE CROSS/BLUE SHIELD | Admitting: Psychiatry

## 2016-02-14 VITALS — BP 138/98 | HR 83 | Temp 97.8°F | Ht 60.0 in | Wt 164.0 lb

## 2016-02-14 DIAGNOSIS — F331 Major depressive disorder, recurrent, moderate: Secondary | ICD-10-CM

## 2016-02-14 MED ORDER — ALPRAZOLAM 0.25 MG PO TABS: 0.2500 mg | ORAL_TABLET | Freq: Every day | ORAL | Status: DC

## 2016-02-14 MED ORDER — ESCITALOPRAM OXALATE 20 MG PO TABS
20.0000 mg | ORAL_TABLET | ORAL | Status: DC
Start: 2016-02-14 — End: 2016-11-15

## 2016-02-14 MED ORDER — ESCITALOPRAM OXALATE 20 MG PO TABS: 20.0000 mg | ORAL_TABLET | ORAL | Status: DC

## 2016-02-14 MED ORDER — ARIPIPRAZOLE 2 MG PO TABS: 2.0000 mg | ORAL_TABLET | Freq: Every day | ORAL | Status: DC

## 2016-02-14 NOTE — Progress Notes (Signed)
BH MD/PA/NP OP Progress Note  02/14/2016 3:41 PM Evelyn Bentley  MRN:  [id number]  Subjective:    Patient is a 60 year old widowed female who presented for the follow-up appointment. She reported that she is doing well and has been compliant with her medications. She reported that she has started doing crochet and has been keeping herself busy. She is going to have the hysterectomy done on April 18 due to abnormal Pap smears. She reported that she is planning  to go on vacation with her family next week.  She is looking forward to the vacation. . Patient reported that she takes Xanax on a when necessary basis. She sleeps well with the medications. She denied having any adverse effects of the medications. She denied having any more swings anger anxiety or paranoia.  Patient currently denied having any suicidal ideations or plans. She reported that she takes Abilify in the morningl. She denied having any perceptual disturbances. She denied use of drugs or alcohol at this time.   Chief Complaint:  Chief Complaint    Follow-up; Medication Refill     Visit Diagnosis:     ICD-9-CM ICD-10-CM   1. MDD (major depressive disorder), recurrent episode, moderate ([place]) 296.32 F33.1     Past Medical History:  Past Medical History  Diagnosis Date  . Anxiety   . Depression   . Hypertension   . GERD (gastroesophageal reflux disease)   . Hypercholesteremia   . Thyroid disease     Past Surgical History  Procedure Laterality Date  . Cholecystectomy    . Neck surgery     Family History:  Family History  Problem Relation Age of Onset  . Depression Mother   . Anxiety disorder Mother    Social History:  Social History   Social History  . Marital Status: Widowed    Spouse Name: N/A  . Number of Children: N/A  . Years of Education: N/A   Social History Main Topics  . Smoking status: Former Smoker    Start date: 11/14/1974    Quit date: 05/14/2014  . Smokeless tobacco: Never Used  .  Alcohol Use: 0.0 - 0.6 oz/week    0-1 Glasses of wine, 0 Cans of beer, 0 Shots of liquor, 0 Standard drinks or equivalent per week  . Drug Use: No  . Sexual Activity: Not Currently   Other Topics Concern  . None   Social History Narrative   Additional History:   Patient is currently widowed and is living with her family members.  Assessment:   Musculoskeletal: Strength & Muscle Tone: within normal limits Gait & Station: normal Patient leans: N/A  Psychiatric Specialty Exam: HPI  ROS  Blood pressure 138/98, pulse 83, temperature 97.8 F (36.6 C), temperature source Tympanic, height 5' (1.524 m), weight 164 lb (74.39 kg), SpO2 95 %.Body mass index is 32.03 kg/(m^2).  General Appearance: Casual  Eye Contact:  Fair  Speech:  Clear and Coherent and Normal Rate  Volume:  Normal  Mood:  Anxious and Depressed  Affect:  Appropriate  Thought Process:  Coherent and Goal Directed  Orientation:  Full (Time, Place, and Person)  Thought Content:  WDL  Suicidal Thoughts:  No  Homicidal Thoughts:  No  Memory:  Immediate;   Fair  Judgement:  Fair  Insight:  Fair  Psychomotor Activity:  Normal  Concentration:  Fair  Recall:  [employer] of Knowledge: Fair  Language: Fair  Akathisia:  No  Handed:  Right  AIMS (  if indicated):    Assets:  Communication Skills Desire for Improvement Physical Health Social Support  ADL's:  Intact  Cognition: WNL  Sleep:     Is the patient at risk to self?  No. Has the patient been a risk to self in the past 6 months?  No. Has the patient been a risk to self within the distant past?  No. Is the patient a risk to others?  No. Has the patient been a risk to others in the past 6 months?  No. Has the patient been a risk to others within the distant past?  No.  Current Medications: Current Outpatient Prescriptions  Medication Sig Dispense Refill  . ALPRAZolam (XANAX) 0.25 MG tablet Take 1 tablet (0.25 mg total) by mouth at bedtime. 30 tablet 2  .  ARIPiprazole (ABILIFY) 2 MG tablet Take 1 tablet (2 mg total) by mouth daily. 30 tablet 2  . atorvastatin (LIPITOR) 10 MG tablet Take 20 mg by mouth daily.    Marland Kitchen escitalopram (LEXAPRO) 20 MG tablet Take 1 tablet (20 mg total) by mouth every morning. 30 tablet 2  . fluticasone (FLONASE) 50 MCG/ACT nasal spray Place into the nose.    . gabapentin (NEURONTIN) 300 MG capsule Take by mouth.    . levothyroxine (SYNTHROID, LEVOTHROID) 175 MCG tablet   10  . lisinopril-hydrochlorothiazide (PRINZIDE,ZESTORETIC) 10-12.5 MG tablet   2  . meloxicam (MOBIC) 15 MG tablet Take by mouth.    Marland Kitchen omeprazole (PRILOSEC) 40 MG capsule   2  . oxyCODONE-acetaminophen (PERCOCET/ROXICET) 5-325 MG tablet Take by mouth.    . Vitamin D, Ergocalciferol, (DRISDOL) 50000 units CAPS capsule   2   No current facility-administered medications for this visit.    Medical Decision Making:  Review of Psycho-Social Stressors (1)  Treatment Plan Summary:Medication management  She will continue on Lexapro 20 mg daily She will continue on Abilify 2 mg at bedtime She will continue on alprazolam 0.25 mg at bedtime Patient was given 2 month supply of the medications  Discussed with her about the adverse effects of the medication in detail and she demonstrated understanding Follow-up in 2 months or earlier    Rainey Pines, MD    02/14/2016, 3:41 PM

## 2016-02-17 ENCOUNTER — Encounter
Admission: RE | Admit: 2016-02-17 | Discharge: 2016-02-17 | Disposition: A | Discharge status: Home / Self Care | Payer: BLUE CROSS/BLUE SHIELD | Source: Ambulatory Visit | Attending: Obstetrics & Gynecology | Admitting: Obstetrics & Gynecology

## 2016-02-17 DIAGNOSIS — Z01810 Encounter for preprocedural cardiovascular examination: Secondary | ICD-10-CM | POA: Insufficient documentation

## 2016-02-17 DIAGNOSIS — Z01812 Encounter for preprocedural laboratory examination: Secondary | ICD-10-CM | POA: Diagnosis present

## 2016-02-17 HISTORY — DX: Hypothyroidism, unspecified: E03.9

## 2016-02-17 HISTORY — DX: Unspecified osteoarthritis, unspecified site: M19.90

## 2016-02-17 HISTORY — DX: Fibromyalgia: M79.7

## 2016-02-17 HISTORY — DX: Bursopathy, unspecified: M71.9

## 2016-02-17 HISTORY — DX: Other complications of anesthesia, initial encounter: T88.59XA

## 2016-02-17 HISTORY — DX: Adverse effect of unspecified anesthetic, initial encounter: T41.45XA

## 2016-02-17 LAB — CBC
HEMATOCRIT: 40 % (ref 35.0–47.0)
HEMOGLOBIN: 13.8 g/dL (ref 12.0–16.0)
MCH: 29.6 pg (ref 26.0–34.0)
MCHC: 34.4 g/dL (ref 32.0–36.0)
MCV: 86 fL (ref 80.0–100.0)
Platelets: 256 10*3/uL (ref 150–440)
RBC: 4.65 MIL/uL (ref 3.80–5.20)
RDW: 13.3 % (ref 11.5–14.5)
WBC: 5.9 10*3/uL (ref 3.6–11.0)

## 2016-02-17 LAB — TYPE AND SCREEN
ABO/RH(D): O POS
Antibody Screen: NEGATIVE

## 2016-02-17 LAB — BASIC METABOLIC PANEL
Anion gap: 4 — ABNORMAL LOW (ref 5–15)
BUN: 10 mg/dL (ref 6–20)
CHLORIDE: 106 mmol/L (ref 101–111)
CO2: 27 mmol/L (ref 22–32)
Calcium: 9.2 mg/dL (ref 8.9–10.3)
Creatinine, Ser: 0.73 mg/dL (ref 0.44–1.00)
GFR calc Af Amer: >60 mL/min (ref >60)
GFR calc non Af Amer: >60 mL/min (ref >60)
GLUCOSE: 99 mg/dL (ref 65–99)
POTASSIUM: 4.4 mmol/L (ref 3.5–5.1)
Sodium: 137 mmol/L (ref 135–145)

## 2016-02-17 LAB — ABO/RH: ABO/RH(D): O POS

## 2016-02-17 NOTE — Patient Instructions (Signed)
  Your procedure is scheduled on: 02/28/16 Tues Report to Day Surgery.2nd floor medical mall To find out your arrival time please call [phone] between 1PM - 3PM on 02/27/16 Mon.  Remember: Instructions that are not followed completely may result in serious medical risk, up to and including death, or upon the discretion of your surgeon and anesthesiologist your surgery may need to be rescheduled.    _x___ 1. Do not eat food or drink liquids after midnight. No gum chewing or hard candies.     __x__ 2. No Alcohol for 24 hours before or after surgery.   ____ 3. Bring all medications with you on the day of surgery if instructed.    __x__ 4. Notify your doctor if there is any change in your medical condition     (cold, fever, infections).     Do not wear jewelry, make-up, hairpins, clips or nail polish.  Do not wear lotions, powders, or perfumes. You may wear deodorant.  Do not shave 48 hours prior to surgery. Men may shave face and neck.  Do not bring valuables to the hospital.    [facility] is not responsible for any belongings or valuables.               Contacts, dentures or bridgework may not be worn into surgery.  Leave your suitcase in the car. After surgery it may be brought to your room.  For patients admitted to the hospital, discharge time is determined by your                treatment team.   Patients discharged the day of surgery will not be allowed to drive home.   Please read over the following fact sheets that you were given:      _x___ Take these medicines the morning of surgery with A SIP OF WATER:    1. atorvastatin (LIPITOR) 10 MG tablet  2. escitalopram (LEXAPRO) 20 MG tablet  3. levothyroxine (SYNTHROID, LEVOTHROID) 175 MCG tablet  4.omeprazole (PRILOSEC) 40 MG capsule  5.oxyCODONE-acetaminophen (PERCOCET/ROXICET) 5-325 MG tablet  6.  ____ Fleet Enema (as directed)   _x___ Use CHG Soap as directed  ____ Use inhalers on the day of surgery  ____ Stop  metformin 2 days prior to surgery    ____ Take 1/2 of usual insulin dose the night before surgery and none on the morning of surgery.   ____ Stop Coumadin/Plavix/aspirin on   _x___ Stop Anti-inflammatories on stop excedrin 1 week before surgery, may take Tylenol as needed   ____ Stop supplements until after surgery.    ____ Bring C-Pap to the hospital.

## 2016-02-21 NOTE — Pre-Procedure Instructions (Signed)
EKG FAXED TO DR Wolfgang Phoenix AS FYI AS REQUESTED BY DR Kayleen Memos. OK TO PROCEED WITH SURGERY

## 2016-02-28 ENCOUNTER — Ambulatory Visit: Payer: BLUE CROSS/BLUE SHIELD | Admitting: Anesthesiology

## 2016-02-28 ENCOUNTER — Encounter
Admission: RE | Disposition: A | Discharge status: Home / Self Care | Payer: Self-pay | Source: Ambulatory Visit | Attending: Obstetrics & Gynecology

## 2016-02-28 ENCOUNTER — Encounter: Payer: Self-pay | Admitting: *Deleted

## 2016-02-28 ENCOUNTER — Ambulatory Visit
Admission: RE | Admit: 2016-02-28 | Discharge: 2016-02-28 | Disposition: A | Discharge status: Home / Self Care | Payer: BLUE CROSS/BLUE SHIELD | Source: Ambulatory Visit | Attending: Obstetrics & Gynecology | Admitting: Obstetrics & Gynecology

## 2016-02-28 DIAGNOSIS — Z833 Family history of diabetes mellitus: Secondary | ICD-10-CM | POA: Diagnosis not present

## 2016-02-28 DIAGNOSIS — D259 Leiomyoma of uterus, unspecified: Secondary | ICD-10-CM | POA: Diagnosis not present

## 2016-02-28 DIAGNOSIS — R87613 High grade squamous intraepithelial lesion on cytologic smear of cervix (HGSIL): Secondary | ICD-10-CM | POA: Insufficient documentation

## 2016-02-28 DIAGNOSIS — N72 Inflammatory disease of cervix uteri: Secondary | ICD-10-CM | POA: Diagnosis not present

## 2016-02-28 DIAGNOSIS — N840 Polyp of corpus uteri: Secondary | ICD-10-CM | POA: Diagnosis not present

## 2016-02-28 DIAGNOSIS — M069 Rheumatoid arthritis, unspecified: Secondary | ICD-10-CM | POA: Insufficient documentation

## 2016-02-28 DIAGNOSIS — I10 Essential (primary) hypertension: Secondary | ICD-10-CM | POA: Diagnosis not present

## 2016-02-28 DIAGNOSIS — Z79899 Other long term (current) drug therapy: Secondary | ICD-10-CM | POA: Insufficient documentation

## 2016-02-28 DIAGNOSIS — E7800 Pure hypercholesterolemia, unspecified: Secondary | ICD-10-CM | POA: Insufficient documentation

## 2016-02-28 DIAGNOSIS — Z87891 Personal history of nicotine dependence: Secondary | ICD-10-CM | POA: Diagnosis not present

## 2016-02-28 DIAGNOSIS — K219 Gastro-esophageal reflux disease without esophagitis: Secondary | ICD-10-CM | POA: Insufficient documentation

## 2016-02-28 DIAGNOSIS — E039 Hypothyroidism, unspecified: Secondary | ICD-10-CM | POA: Diagnosis not present

## 2016-02-28 DIAGNOSIS — F329 Major depressive disorder, single episode, unspecified: Secondary | ICD-10-CM | POA: Insufficient documentation

## 2016-02-28 DIAGNOSIS — D270 Benign neoplasm of right ovary: Secondary | ICD-10-CM | POA: Diagnosis not present

## 2016-02-28 DIAGNOSIS — M797 Fibromyalgia: Secondary | ICD-10-CM | POA: Diagnosis not present

## 2016-02-28 DIAGNOSIS — F419 Anxiety disorder, unspecified: Secondary | ICD-10-CM | POA: Diagnosis not present

## 2016-02-28 HISTORY — PX: LAPAROSCOPIC HYSTERECTOMY: SHX1926

## 2016-02-28 SURGERY — HYSTERECTOMY, TOTAL, LAPAROSCOPIC
Anesthesia: General | Laterality: Bilateral | Wound class: Clean Contaminated

## 2016-02-28 MED ORDER — FENTANYL CITRATE (PF) 100 MCG/2ML IJ SOLN
INTRAMUSCULAR | Status: DC | PRN
  Administered 2016-02-28 (×2): 50 ug via INTRAVENOUS

## 2016-02-28 MED ORDER — SUCCINYLCHOLINE CHLORIDE 20 MG/ML IJ SOLN
INTRAMUSCULAR | Status: DC | PRN
  Administered 2016-02-28: 100 mg via INTRAVENOUS

## 2016-02-28 MED ORDER — ONDANSETRON HCL 4 MG/2ML IJ SOLN
INTRAMUSCULAR | Status: DC | PRN
  Administered 2016-02-28: 4 mg via INTRAVENOUS

## 2016-02-28 MED ORDER — DEXAMETHASONE SODIUM PHOSPHATE 4 MG/ML IJ SOLN
INTRAMUSCULAR | Status: DC | PRN
  Administered 2016-02-28: 10 mg via INTRAVENOUS

## 2016-02-28 MED ORDER — PHENYLEPHRINE HCL 10 MG/ML IJ SOLN
INTRAMUSCULAR | Status: DC | PRN
  Administered 2016-02-28: 200 ug via INTRAVENOUS
  Administered 2016-02-28: 100 ug via INTRAVENOUS

## 2016-02-28 MED ORDER — KETOROLAC TROMETHAMINE 30 MG/ML IJ SOLN
30.0000 mg | Freq: Four times a day (QID) | INTRAMUSCULAR | Status: DC
  Administered 2016-02-28: 30 mg via INTRAVENOUS

## 2016-02-28 MED ORDER — MORPHINE SULFATE (PF) 2 MG/ML IV SOLN: 1.0000 mg | INTRAVENOUS | Status: DC | PRN

## 2016-02-28 MED ORDER — SCOPOLAMINE 1 MG/3DAYS TD PT72
MEDICATED_PATCH | TRANSDERMAL | Status: AC
  Filled 2016-02-28: qty 1

## 2016-02-28 MED ORDER — ACETAMINOPHEN 325 MG PO TABS: 650.0000 mg | ORAL_TABLET | ORAL | Status: DC | PRN

## 2016-02-28 MED ORDER — ONDANSETRON HCL 4 MG/2ML IJ SOLN
INTRAMUSCULAR | Status: AC
  Filled 2016-02-28: qty 2

## 2016-02-28 MED ORDER — LACTATED RINGERS IV SOLN
INTRAVENOUS | Status: DC
  Administered 2016-02-28: 14:00:00 via INTRAVENOUS

## 2016-02-28 MED ORDER — OXYCODONE-ACETAMINOPHEN 5-325 MG PO TABS
1.0000 | ORAL_TABLET | ORAL | Status: DC | PRN
  Administered 2016-02-28: 1 via ORAL

## 2016-02-28 MED ORDER — KETOROLAC TROMETHAMINE 30 MG/ML IJ SOLN
INTRAMUSCULAR | Status: AC
  Administered 2016-02-28: 30 mg via INTRAVENOUS
  Filled 2016-02-28: qty 1

## 2016-02-28 MED ORDER — OXYCODONE-ACETAMINOPHEN 5-325 MG PO TABS
ORAL_TABLET | ORAL | Status: AC
  Filled 2016-02-28: qty 1

## 2016-02-28 MED ORDER — LIDOCAINE HCL (CARDIAC) 20 MG/ML IV SOLN
INTRAVENOUS | Status: DC | PRN
  Administered 2016-02-28: 80 mg via INTRAVENOUS

## 2016-02-28 MED ORDER — ACETAMINOPHEN 650 MG RE SUPP: 650.0000 mg | RECTAL | Status: DC | PRN

## 2016-02-28 MED ORDER — ROCURONIUM BROMIDE 100 MG/10ML IV SOLN
INTRAVENOUS | Status: DC | PRN
  Administered 2016-02-28: 40 mg via INTRAVENOUS

## 2016-02-28 MED ORDER — OXYCODONE-ACETAMINOPHEN 5-325 MG PO TABS
1.0000 | ORAL_TABLET | ORAL | Status: DC | PRN
Start: 2016-02-28 — End: 2016-11-15

## 2016-02-28 MED ORDER — EPHEDRINE SULFATE 50 MG/ML IJ SOLN
INTRAMUSCULAR | Status: DC | PRN
  Administered 2016-02-28: 10 mg via INTRAVENOUS

## 2016-02-28 MED ORDER — MIDAZOLAM HCL 2 MG/2ML IJ SOLN
INTRAMUSCULAR | Status: DC | PRN
  Administered 2016-02-28: 2 mg via INTRAVENOUS

## 2016-02-28 MED ORDER — FENTANYL CITRATE (PF) 100 MCG/2ML IJ SOLN
25.0000 ug | INTRAMUSCULAR | Status: DC | PRN
  Administered 2016-02-28 (×4): 25 ug via INTRAVENOUS

## 2016-02-28 MED ORDER — ONDANSETRON HCL 4 MG/2ML IJ SOLN
4.0000 mg | Freq: Once | INTRAMUSCULAR | Status: AC | PRN
  Administered 2016-02-28: 4 mg via INTRAVENOUS

## 2016-02-28 MED ORDER — HEPARIN SODIUM (PORCINE) 5000 UNIT/ML IJ SOLN
INTRAMUSCULAR | Status: AC
  Filled 2016-02-28: qty 1

## 2016-02-28 MED ORDER — SCOPOLAMINE 1 MG/3DAYS TD PT72
1.0000 | MEDICATED_PATCH | TRANSDERMAL | Status: DC
  Administered 2016-02-28: 1.5 mg via TRANSDERMAL

## 2016-02-28 MED ORDER — FENTANYL CITRATE (PF) 100 MCG/2ML IJ SOLN
INTRAMUSCULAR | Status: AC
  Administered 2016-02-28: 25 ug via INTRAVENOUS
  Filled 2016-02-28: qty 2

## 2016-02-28 MED ORDER — SUGAMMADEX SODIUM 200 MG/2ML IV SOLN
INTRAVENOUS | Status: DC | PRN
  Administered 2016-02-28: 147 mg via INTRAVENOUS

## 2016-02-28 MED ORDER — PROPOFOL 10 MG/ML IV BOLUS
INTRAVENOUS | Status: DC | PRN
  Administered 2016-02-28: 160 mg via INTRAVENOUS

## 2016-02-28 MED ORDER — CEFAZOLIN SODIUM-DEXTROSE 2-4 GM/100ML-% IV SOLN
2.0000 g | Freq: Once | INTRAVENOUS | Status: AC
  Administered 2016-02-28: 2 g via INTRAVENOUS

## 2016-02-28 MED ORDER — CEFAZOLIN SODIUM-DEXTROSE 2-4 GM/100ML-% IV SOLN
INTRAVENOUS | Status: AC
  Filled 2016-02-28: qty 100

## 2016-02-28 MED ORDER — IBUPROFEN 600 MG PO TABS: 600.0000 mg | ORAL_TABLET | Freq: Four times a day (QID) | ORAL | Status: DC

## 2016-02-28 MED ORDER — HEPARIN SODIUM (PORCINE) 5000 UNIT/ML IJ SOLN
5000.0000 [IU] | Freq: Once | INTRAMUSCULAR | Status: AC
  Administered 2016-02-28: 5000 [IU] via SUBCUTANEOUS

## 2016-02-28 SURGICAL SUPPLY — 49 items
BAG URO DRAIN 2000ML W/SPOUT (MISCELLANEOUS) ×3 IMPLANT
BLADE SURG SZ11 CARB STEEL (BLADE) ×3 IMPLANT
CANISTER SUCT 1200ML W/VALVE (MISCELLANEOUS) ×3 IMPLANT
CATH FOLEY 2WAY  5CC 16FR (CATHETERS) ×2
CATH FOLEY 2WAY 5CC 16FR (CATHETERS) ×1
CATH URTH 16FR FL 2W BLN LF (CATHETERS) ×1 IMPLANT
CHLORAPREP W/TINT 26ML (MISCELLANEOUS) ×6 IMPLANT
DEFOGGER SCOPE WARMER CLEARIFY (MISCELLANEOUS) ×3 IMPLANT
DEVICE SUTURE ENDOST 10MM (ENDOMECHANICALS) ×3 IMPLANT
DRAPE LEGGINS SURG 28X43 STRL (DRAPES) ×3 IMPLANT
DRAPE SHEET LG 3/4 BI-LAMINATE (DRAPES) ×3 IMPLANT
DRAPE UNDER BUTTOCK W/FLU (DRAPES) ×3 IMPLANT
GLOVE PI ORTHOPRO 6.5 (GLOVE) ×8
GLOVE PI ORTHOPRO STRL 6.5 (GLOVE) ×4 IMPLANT
GLOVE SURG SYN 6.5 ES PF (GLOVE) ×12 IMPLANT
GLOVE SURG SYN 6.5 PF PI (GLOVE) ×4 IMPLANT
GOWN STRL REUS W/ TWL LRG LVL3 (GOWN DISPOSABLE) ×3 IMPLANT
GOWN STRL REUS W/ TWL XL LVL3 (GOWN DISPOSABLE) ×1 IMPLANT
GOWN STRL REUS W/TWL LRG LVL3 (GOWN DISPOSABLE) ×9
GOWN STRL REUS W/TWL XL LVL3 (GOWN DISPOSABLE) ×3
HIBICLENS CHG 4% 32OZ (MISCELLANEOUS) ×3 IMPLANT
IRRIGATION STRYKERFLOW (MISCELLANEOUS) ×1 IMPLANT
IRRIGATOR STRYKERFLOW (MISCELLANEOUS) ×3
IV LACTATED RINGERS 1000ML (IV SOLUTION) ×3 IMPLANT
KIT PINK PAD W/HEAD ARE REST (MISCELLANEOUS) ×3
KIT PINK PAD W/HEAD ARM REST (MISCELLANEOUS) ×1 IMPLANT
KIT RM TURNOVER CYSTO AR (KITS) ×3 IMPLANT
L-HOOK LAP DISP 36CM (ELECTROSURGICAL) ×3
LHOOK LAP DISP 36CM (ELECTROSURGICAL) ×1 IMPLANT
LIGASURE BLUNT 5MM 37CM (INSTRUMENTS) ×3 IMPLANT
LIQUID BAND (GAUZE/BANDAGES/DRESSINGS) ×3 IMPLANT
MANIPULATOR VCARE LG CRV RETR (MISCELLANEOUS) IMPLANT
MANIPULATOR VCARE SML CRV RETR (MISCELLANEOUS) IMPLANT
MANIPULATOR VCARE STD CRV RETR (MISCELLANEOUS) ×2 IMPLANT
NS IRRIG 500ML POUR BTL (IV SOLUTION) ×3 IMPLANT
PACK LAP CHOLECYSTECTOMY (MISCELLANEOUS) ×3 IMPLANT
PAD OB MATERNITY 4.3X12.25 (PERSONAL CARE ITEMS) ×3 IMPLANT
PAD PREP 24X41 OB/GYN DISP (PERSONAL CARE ITEMS) ×3 IMPLANT
PENCIL ELECTRO HAND CTR (MISCELLANEOUS) ×3 IMPLANT
SCISSORS METZENBAUM CVD 33 (INSTRUMENTS) IMPLANT
SET CYSTO W/LG BORE CLAMP LF (SET/KITS/TRAYS/PACK) IMPLANT
SLEEVE ENDOPATH XCEL 5M (ENDOMECHANICALS) ×5 IMPLANT
SURGILUBE 2OZ TUBE FLIPTOP (MISCELLANEOUS) ×3 IMPLANT
SUT ENDO VLOC 180-0-8IN (SUTURE) ×3 IMPLANT
SUT VIC AB 0 CT1 36 (SUTURE) ×3 IMPLANT
SUT VIC AB 2-0 UR6 27 (SUTURE) ×3 IMPLANT
TROCAR BLUNT TIP 12MM OMST12BT (TROCAR) ×3 IMPLANT
TROCAR XCEL NON-BLD 5MMX100MML (ENDOMECHANICALS) ×3 IMPLANT
TUBING INSUFFLATOR HEATED (MISCELLANEOUS) ×3 IMPLANT

## 2016-02-28 NOTE — Anesthesia Preprocedure Evaluation (Addendum)
Anesthesia Evaluation  Patient identified by MRN, date of birth, ID band Patient awake    Reviewed: Allergy & Precautions, NPO status , Patient's Chart, lab work & pertinent test results  History of Anesthesia Complications (+) PONV and history of anesthetic complications  Airway Mallampati: III  TM Distance: <3 FB    Comment: Short chin Dental  (+) Caps Front top teeth caps:   Pulmonary former smoker,    Pulmonary exam normal breath sounds clear to auscultation       Cardiovascular hypertension, Pt. on medications Normal cardiovascular exam     Neuro/Psych Anxiety Depression    GI/Hepatic Neg liver ROS, GERD  Medicated and Controlled,  Endo/Other  Hypothyroidism   Renal/GU negative Renal ROS     Musculoskeletal  (+) Arthritis , Osteoarthritis,  Fibromyalgia -  Abdominal Normal abdominal exam  (+)   Peds  Hematology   Anesthesia Other Findings   Reproductive/Obstetrics                            Anesthesia Physical Anesthesia Plan  ASA: III  Anesthesia Plan: General   Post-op Pain Management:    Induction: Intravenous, Rapid sequence and Cricoid pressure planned  Airway Management Planned: Oral ETT  Additional Equipment:   Intra-op Plan:   Post-operative Plan: Extubation in OR  Informed Consent: I have reviewed the patients History and Physical, chart, labs and discussed the procedure including the risks, benefits and alternatives for the proposed anesthesia with the patient or authorized representative who has indicated his/her understanding and acceptance.   Dental advisory given  Plan Discussed with: CRNA and Surgeon  Anesthesia Plan Comments:         Anesthesia Quick Evaluation

## 2016-02-28 NOTE — Anesthesia Procedure Notes (Signed)
Procedure Name: Intubation Date/Time: 02/28/2016 3:11 PM Performed by: Aline Brochure Pre-anesthesia Checklist: Patient identified, Emergency Drugs available, Suction available and Patient being monitored Patient Re-evaluated:Patient Re-evaluated prior to inductionOxygen Delivery Method: Circle system utilized Preoxygenation: Pre-oxygenation with 100% oxygen Intubation Type: IV induction Ventilation: Mask ventilation without difficulty Laryngoscope Size: McGraph and 3 Grade View: Grade II Tube type: Oral Tube size: 7.0 mm Number of attempts: 2 Airway Equipment and Method: Bougie stylet and Video-laryngoscopy Placement Confirmation: ETT inserted through vocal cords under direct vision,  positive ETCO2 and breath sounds checked- equal and bilateral Secured at: 22 cm Tube secured with: Tape Dental Injury: Teeth and Oropharynx as per pre-operative assessment  Difficulty Due To: Difficulty was anticipated, Difficult Airway- due to anterior larynx, Difficult Airway- due to immobile epiglottis and Difficult Airway- due to reduced neck mobility Future Recommendations: Recommend- induction with short-acting agent, and alternative techniques readily available

## 2016-02-28 NOTE — Anesthesia Postprocedure Evaluation (Signed)
Anesthesia Post Note  Patient: Evelyn Bentley  Procedure(s) Performed: Procedure(s) (LRB): HYSTERECTOMY TOTAL LAPAROSCOPIC / BSO (Bilateral)  Patient location during evaluation: PACU Anesthesia Type: General Level of consciousness: awake and alert Pain management: pain level controlled Vital Signs Assessment: post-procedure vital signs reviewed and stable Respiratory status: spontaneous breathing, nonlabored ventilation, respiratory function stable and patient connected to nasal cannula oxygen Cardiovascular status: blood pressure returned to baseline and stable Postop Assessment: no signs of nausea or vomiting Anesthetic complications: no    Last Vitals:  Filed Vitals:   02/28/16 1718 02/28/16 1727  BP: 117/47 95/77  Pulse: 92 103  Temp: 36.7 C 36.4 C  Resp: 16 20    Last Pain:  Filed Vitals:   02/28/16 1729  PainSc: [place] Wallie Lagrand

## 2016-02-28 NOTE — Discharge Instructions (Signed)
Discharge instructions:  Call office if you have any of the following: fever >101 F, chills, excessive vaginal bleeding, incision drainage or problems, severe abdominal pain, leg pain or redness, or any other concerns.   Activity: Do not lift > 10 lbs for 8 weeks.  No intercourse or tampons for 8 weeks.  No driving for 1-2 weeks, until you are certain you can slam on the brakes to avoid an accident.   You will experience some pain under your right rib and in your right shoulder.  This is due to the gas used to inflate your belly for surgery, and will go away with time.  Be patient!  Please don't limit yourself in terms of routine activity.  You will be able to do most things, although they may take longer to do or be a little painful.  You can do it!  Don't be a hero, but don't be a wimp either!     AMBULATORY SURGERY  DISCHARGE INSTRUCTIONS   1) The drugs that you were given will stay in your system until tomorrow so for the next 24 hours you should not:  A) Drive an automobile B) Make any legal decisions C) Drink any alcoholic beverage   2) You may resume regular meals tomorrow.  Today it is better to start with liquids and gradually work up to solid foods.  You may eat anything you prefer, but it is better to start with liquids, then soup and crackers, and gradually work up to solid foods.   3) Please notify your doctor immediately if you have any unusual bleeding, trouble breathing, redness and pain at the surgery site, drainage, fever, or pain not relieved by medication.    4) Additional Instructions:        Please contact your physician with any problems or Same Day Surgery at [phone], Monday through Friday 6 am to 4 pm, or [place] at [facility] number at [phone].

## 2016-02-28 NOTE — Transfer of Care (Signed)
Immediate Anesthesia Transfer of Care Note  Patient: Evelyn Bentley  Procedure(s) Performed: Procedure(s): HYSTERECTOMY TOTAL LAPAROSCOPIC / BSO (Bilateral)  Patient Location: PACU  Anesthesia Type:General  Level of Consciousness: sedated  Airway & Oxygen Therapy: Patient Spontanous Breathing and Patient connected to face mask oxygen  Post-op Assessment: Report given to RN and Post -op Vital signs reviewed and stable  Post vital signs: stable  Last Vitals:  Filed Vitals:   02/28/16 1258 02/28/16 1633  BP: 107/40 131/70  Pulse: 87 74  Temp: 36.7 C 36.2 C  Resp: 16 15    Complications: No apparent anesthesia complications

## 2016-02-28 NOTE — Anesthesia Postprocedure Evaluation (Signed)
Anesthesia Post Note  Patient: Evelyn Bentley  Procedure(s) Performed: Procedure(s) (LRB): HYSTERECTOMY TOTAL LAPAROSCOPIC / BSO (Bilateral)  Patient location during evaluation: PACU Anesthesia Type: General Level of consciousness: awake and alert Pain management: pain level controlled Vital Signs Assessment: post-procedure vital signs reviewed and stable Respiratory status: spontaneous breathing, nonlabored ventilation, respiratory function stable and patient connected to nasal cannula oxygen Cardiovascular status: blood pressure returned to baseline and stable Postop Assessment: no signs of nausea or vomiting Anesthetic complications: no    Last Vitals:  Filed Vitals:   02/28/16 1718 02/28/16 1727  BP: 117/47 95/77  Pulse: 92 103  Temp: 36.7 C 36.4 C  Resp: 16 20    Last Pain:  Filed Vitals:   02/28/16 1729  PainSc: [place]

## 2016-02-28 NOTE — H&P (Signed)
H&P Update  PLEASE SEE PAPER H&P  Pt was last seen in my office, and complete history and physical performed.  The surgical history has been reviewed and remains accurate without interval change. The patient was re-examined and patient's physiologic condition has not changed significantly in the last 30 days.  No new pharmacological allergies or types of therapy has been initiated.  Allergies  Allergen Reactions  . Codeine Nausea Only    Past Medical History  Diagnosis Date  . Anxiety   . Depression   . Hypertension   . GERD (gastroesophageal reflux disease)   . Hypercholesteremia   . Thyroid disease   . Hypothyroidism   . Arthritis   . Bursitis     Rt hip  . Fibromyalgia   . Complication of anesthesia     nausea   Past Surgical History  Procedure Laterality Date  . Cholecystectomy    . Neck surgery    . Nissen fundoplication    . Tubal ligation      BP 107/40 mmHg  Pulse 87  Temp(Src) 98.1 F (36.7 C) (Tympanic)  Resp 16  Ht 5' (1.524 m)  Wt 73.483 kg (162 lb)  BMI 31.64 kg/m2  SpO2 97%  NAD RRR no murmurs CTAB, no wheezing, resps unlabored +BS, soft, NTTP No c/c/e Pelvic exam deferred  The above history was confirmed with the patient. The condition still exists that makes this procedure necessary. Surgical plan includes Total Laparoscopic Hysterectomy, Bilateral Salpingo-oophorectomy, as confirmed on the consent. The treatment plan remains the same, without new options for care.  The patient understands the potential benefits and risks and the consents have been signed and placed on the chart.     Larey Days, MD Attending Obstetrician Gynecologist [facility]

## 2016-02-28 NOTE — Op Note (Signed)
Total Laparoscopic Hysterectomy Operative Note Procedure Date: 02/28/2016  Patient:  Evelyn Bentley  60 y.o. female  PRE-OPERATIVE DIAGNOSIS:  CERVICAL DYSPLASIA  POST-OPERATIVE DIAGNOSIS:  CERVICAL DYSPLASIA  PROCEDURE:  Procedure(s): Total Laparoscopic Hysterectomy, Bilateral Salpingo-Oophorectomy  SURGEON:  Surgeon(s) and Role:    * Arlett Goold C Tuere Nwosu, MD - Primary  ANESTHESIA:  General via ET  I/O  Total I/O In: - 800cc crystalloid Out: 200 [Urine:150; Blood:50]  FINDINGS:  Small uterus, normal ovaries and fallopian tubes bilaterally.  Normal upper abdomen.  SPECIMEN: Uterus, Cervix, bilateral fallopian tubes, and bilateral ovaries  COMPLICATIONS: none apparent  DISPOSITION: vital signs stable to PACU  Indication for Surgery: 60 y.o. Who had presented as an outpatient with long history of cervical dysplasia and s/p cryotherapy.  She was offered several treatment options, including observation, cone biopsy, and elected to have a hysterectomy for definitive treatment.  She understands that surveillance is required even without a cervix and that this was an elective procedure, and desired to proceed.  Risks of surgery were discussed with the patient including but not limited to: bleeding which may require transfusion or reoperation; infection which may require antibiotics; injury to bowel, bladder, ureters or other surrounding organs; need for additional procedures including laparotomy, blood clot, incisional problems and other postoperative/anesthesia complications. Written informed consent was obtained.      PROCEDURE IN DETAIL:  The patient had 5000u Heparin Sub-q and sequential compression devices applied to her lower extremities while in the preoperative area.  She was then taken to the operating room where general anesthesia was administered via endotracheal route.  She was placed in the dorsal lithotomy position, and was prepped and draped in a sterile manner. A surgical  time-out was performed.  A Foley catheter was inserted into her bladder and attached to constant drainage and a V-Care uterine manipulator was then advanced into the uterus and a good fit around the cervix was noted. The gloves were changed, and attention was turned to the abdomen where an umbilical incision was made with the scalpel. A 61mm visiport was inserted.  Opening pressure was 25mmHg, and the abdomen was insufflated to 15mg Hg carbon dioxide gas and adequate pneumoperitoneum was obtained. A survey of the patient's pelvis and abdomen revealed the findings as mentioned above. Two 52mm ports were inserted in the lower left and right quadrants under visualization.    The bilateral ureters were easily visible and observed vermiculating.  The bilateral infundibulopelvic ligaments were triply cauterized with the Ligasure, and transected.  The proximal ends were hemostatic.  The medial broad ligaments were divided to the bilateral round ligaments, which were transected.  The anterior broad ligament was divided and brought across the uterus to separate the vesicouterine peritoneum and create a bladder flap. The bladder was pushed away from the uterus. The bilateral uterine arteries were ligated and transected. The bilateral uterosacral and cardinal ligaments were ligated and transected. A colpotomy was made around the V-Care cervical cup and the uterus, cervix, and bilateral tubes were removed through the vagina. The vaginal cuff was closed vaginally using 0-Vicryl in a running locking stitch. This was tested for integrity using the surgeon's finger. After a change of gloves, the pneumoperitoneum was recreated and surgical site inspected, and found to be hemostatic. Bilateral ureters were visualized vermiuclating. No intraoperative injury to surrounding organs was noted. The abdomen was desufflated and all instruments were then removed.   All skin incisions were closed with 4-0 monocryl and covered with  surgical glue. The foley  catheter was removed. The patient tolerated the procedures well.  All instruments, needles, and sponge counts were correct x 2. The patient was taken to the recovery room in stable condition.   ---- Larey Days, MD Attending Obstetrician and Gynecologist [facility]

## 2016-02-29 ENCOUNTER — Encounter: Payer: Self-pay | Admitting: Obstetrics & Gynecology

## 2016-03-04 LAB — SURGICAL PATHOLOGY

## 2016-06-29 ENCOUNTER — Emergency Department
Admission: EM | Admit: 2016-06-29 | Discharge: 2016-06-29 | Disposition: A | Discharge status: Home / Self Care | Payer: Managed Care, Other (non HMO) | Attending: Emergency Medicine | Admitting: Emergency Medicine

## 2016-06-29 ENCOUNTER — Encounter: Payer: Self-pay | Admitting: Emergency Medicine

## 2016-06-29 DIAGNOSIS — Z87891 Personal history of nicotine dependence: Secondary | ICD-10-CM | POA: Diagnosis not present

## 2016-06-29 DIAGNOSIS — E039 Hypothyroidism, unspecified: Secondary | ICD-10-CM | POA: Insufficient documentation

## 2016-06-29 DIAGNOSIS — N201 Calculus of ureter: Secondary | ICD-10-CM | POA: Insufficient documentation

## 2016-06-29 DIAGNOSIS — Z7951 Long term (current) use of inhaled steroids: Secondary | ICD-10-CM | POA: Diagnosis not present

## 2016-06-29 DIAGNOSIS — R109 Unspecified abdominal pain: Secondary | ICD-10-CM | POA: Diagnosis present

## 2016-06-29 DIAGNOSIS — Z79899 Other long term (current) drug therapy: Secondary | ICD-10-CM | POA: Insufficient documentation

## 2016-06-29 DIAGNOSIS — I10 Essential (primary) hypertension: Secondary | ICD-10-CM | POA: Insufficient documentation

## 2016-06-29 LAB — URINALYSIS COMPLETE WITH MICROSCOPIC (ARMC ONLY)
BILIRUBIN URINE: NEGATIVE
Glucose, UA: NEGATIVE mg/dL
HGB URINE DIPSTICK: NEGATIVE
KETONES UR: NEGATIVE mg/dL
LEUKOCYTES UA: NEGATIVE
NITRITE: NEGATIVE
PH: 5 (ref 5.0–8.0)
PROTEIN: NEGATIVE mg/dL
SPECIFIC GRAVITY, URINE: 1.004 — AB (ref 1.005–1.030)

## 2016-06-29 LAB — COMPREHENSIVE METABOLIC PANEL
ALBUMIN: 3.8 g/dL (ref 3.5–5.0)
ALK PHOS: 79 U/L (ref 38–126)
ALT: 21 U/L (ref 14–54)
ANION GAP: 5 (ref 5–15)
AST: 24 U/L (ref 15–41)
BILIRUBIN TOTAL: 0.8 mg/dL (ref 0.3–1.2)
BUN: 12 mg/dL (ref 6–20)
CALCIUM: 8.6 mg/dL — AB (ref 8.9–10.3)
CO2: 28 mmol/L (ref 22–32)
Chloride: 104 mmol/L (ref 101–111)
Creatinine, Ser: 1.02 mg/dL — ABNORMAL HIGH (ref 0.44–1.00)
GFR calc non Af Amer: 59 mL/min — ABNORMAL LOW (ref >60)
GLUCOSE: 111 mg/dL — AB (ref 65–99)
POTASSIUM: 4.5 mmol/L (ref 3.5–5.1)
SODIUM: 137 mmol/L (ref 135–145)
TOTAL PROTEIN: 7.1 g/dL (ref 6.5–8.1)

## 2016-06-29 LAB — CBC
HEMATOCRIT: 38.1 % (ref 35.0–47.0)
HEMOGLOBIN: 13.2 g/dL (ref 12.0–16.0)
MCH: 30.4 pg (ref 26.0–34.0)
MCHC: 34.7 g/dL (ref 32.0–36.0)
MCV: 87.7 fL (ref 80.0–100.0)
Platelets: 269 10*3/uL (ref 150–440)
RBC: 4.35 MIL/uL (ref 3.80–5.20)
RDW: 13.1 % (ref 11.5–14.5)
WBC: 9.6 10*3/uL (ref 3.6–11.0)

## 2016-06-29 MED ORDER — KETOROLAC TROMETHAMINE 10 MG PO TABS: 10.0000 mg | ORAL_TABLET | Freq: Four times a day (QID) | ORAL | 0 refills | Status: DC | PRN

## 2016-06-29 MED ORDER — SODIUM CHLORIDE 0.9 % IV BOLUS (SEPSIS)
1000.0000 mL | Freq: Once | INTRAVENOUS | Status: AC
  Administered 2016-06-29: 1000 mL via INTRAVENOUS

## 2016-06-29 MED ORDER — KETOROLAC TROMETHAMINE 30 MG/ML IJ SOLN
30.0000 mg | Freq: Once | INTRAMUSCULAR | Status: AC
  Administered 2016-06-29: 30 mg via INTRAVENOUS
  Filled 2016-06-29: qty 1

## 2016-06-29 MED ORDER — ONDANSETRON HCL 4 MG/2ML IJ SOLN
4.0000 mg | Freq: Once | INTRAMUSCULAR | Status: AC
  Administered 2016-06-29: 4 mg via INTRAVENOUS
  Filled 2016-06-29: qty 2

## 2016-06-29 NOTE — ED Provider Notes (Signed)
[facility] Emergency Department Provider Note  Time seen: 8:28 AM  I have reviewed the triage vital signs and the nursing notes.   HISTORY  Chief Complaint Flank Pain    HPI Evelyn Bentley is a 60 y.o. female with a past medical history of anxiety, arthritis, depression, fibromyalgia, hypertension, hyperlipidemia, kidney stones who presents the emergency department with right flank pain. According to the patient she was diagnosed with a kidney stone 3 days ago. Patient states for the past 5 days she has been experiencing right flank pain initially with low-grade fevers. She saw her primary care doctor who ordered a CT scan confirming a stone per patient 3 days ago. Patient states initially she was placed on ciprofloxacin as well as Flomax and Percocet. She states her doctor called her back and changed her antibiotic which she is taking, but does not recall the name. Denies any further fever. Denies dysuria. Denies hematuria. States nausea but denies vomiting. States last night the pain was more intense so she came to the emergency department today for evaluation.Describes the pain as sharp, severe in the right flank, currently moderate.  Past Medical History:  Diagnosis Date  . Anxiety   . Arthritis   . Bursitis    Rt hip  . Complication of anesthesia    nausea  . Depression   . Fibromyalgia   . GERD (gastroesophageal reflux disease)   . Hypercholesteremia   . Hypertension   . Hypothyroidism   . Thyroid disease     Patient Active Problem List   Diagnosis Date Noted  . History of migraine headaches 05/02/2015  . Anxiety 04/18/2015  . Depression, major, recurrent, moderate ([place]) 04/18/2015  . H/O: hypothyroidism 03/06/2015  . Fibromyalgia 03/06/2015  . H/O arthritis 03/06/2015    Past Surgical History:  Procedure Laterality Date  . CHOLECYSTECTOMY    . LAPAROSCOPIC HYSTERECTOMY Bilateral 02/28/2016   Procedure: HYSTERECTOMY TOTAL LAPAROSCOPIC /  BSO;  Surgeon: Honor Loh Ward, MD;  Location: [facility] ORS;  Service: Gynecology;  Laterality: Bilateral;  . NECK SURGERY    . NISSEN FUNDOPLICATION    . TUBAL LIGATION      Prior to Admission medications   Medication Sig Start Date End Date Taking? Authorizing Provider  ALPRAZolam (XANAX) 0.25 MG tablet Take 1 tablet (0.25 mg total) by mouth at bedtime. 02/14/16   Rainey Pines, MD  ARIPiprazole (ABILIFY) 2 MG tablet Take 1 tablet (2 mg total) by mouth daily. Patient taking differently: Take 2 mg by mouth at bedtime.  02/14/16   Rainey Pines, MD  atorvastatin (LIPITOR) 10 MG tablet Take 20 mg by mouth daily.    Historical Provider, MD  Cholecalciferol (VITAMIN D3) 1000 units CAPS Take 1 capsule by mouth daily.    Historical Provider, MD  escitalopram (LEXAPRO) 20 MG tablet Take 1 tablet (20 mg total) by mouth every morning. 02/14/16   Rainey Pines, MD  fluticasone (FLONASE) 50 MCG/ACT nasal spray Place into the nose.    Historical Provider, MD  gabapentin (NEURONTIN) 300 MG capsule Take 300 mg by mouth at bedtime.     Historical Provider, MD  ibuprofen (ADVIL,MOTRIN) 600 MG tablet Take 1 tablet (600 mg total) by mouth every 6 (six) hours. 02/28/16   Honor Loh Ward, MD  levothyroxine (SYNTHROID, LEVOTHROID) 175 MCG tablet  01/03/16   Historical Provider, MD  lisinopril-hydrochlorothiazide (PRINZIDE,ZESTORETIC) 10-12.5 MG tablet Take 1 tablet by mouth daily.  11/10/15   Historical Provider, MD  meloxicam (MOBIC) 15 MG  tablet Take by mouth.    Historical Provider, MD  omeprazole (PRILOSEC) 40 MG capsule  01/25/16   Historical Provider, MD  oxyCODONE-acetaminophen (PERCOCET) 5-325 MG tablet Take 1-2 tablets by mouth every 4 (four) hours as needed for severe pain. 02/28/16   Chelsea C Ward, MD  oxyCODONE-acetaminophen (PERCOCET/ROXICET) 5-325 MG tablet Take by mouth.    Historical Provider, MD    Allergies  Allergen Reactions  . Codeine Nausea Only    Family History  Problem Relation Age of Onset  .  Depression Mother   . Anxiety disorder Mother     Social History Social History  Substance Use Topics  . Smoking status: Former Smoker    Start date: 11/14/1974    Quit date: 05/14/2014  . Smokeless tobacco: Never Used  . Alcohol use 0.0 - 0.6 oz/week     Comment: social rare    Review of Systems Constitutional: Negative for fever. Positive for chills. Cardiovascular: Negative for chest pain. Respiratory: Negative for shortness of breath. Gastrointestinal: Right flank pain. Positive for nausea. Negative for vomiting or diarrhea. Genitourinary: Negative for dysuria. Negative for hematuria Musculoskeletal: Positive for right back pain. Neurological: Negative for headache 10-point ROS otherwise negative.  ____________________________________________   PHYSICAL EXAM:  VITAL SIGNS: ED Triage Vitals  Enc Vitals Group     BP 06/29/16 0818 121/72     Pulse Rate 06/29/16 0817 (!) 103     Resp 06/29/16 0817 20     Temp 06/29/16 0817 98.3 F (36.8 C)     Temp Source 06/29/16 0817 Oral     SpO2 06/29/16 0817 98 %     Weight 06/29/16 0817 170 lb (77.1 kg)     Height 06/29/16 0817 5' (1.524 m)     Head Circumference --      Peak Flow --      Pain Score 06/29/16 0817 8     Pain Loc --      Pain Edu? --      Excl. in [place]? --     Constitutional: Alert and oriented. Well appearing and in no distress. Eyes: Normal exam ENT   Head: Normocephalic and atraumatic   Mouth/Throat: Mucous membranes are moist. Cardiovascular: Normal rate, regular rhythm. No murmur Respiratory: Normal respiratory effort without tachypnea nor retractions. Breath sounds are clear Gastrointestinal: Soft, nontender exam, no distention, no CVA tenderness. Musculoskeletal: Nontender with normal range of motion in all extremities Neurologic:  Normal speech and language. No gross focal neurologic deficits  Skin:  Skin is warm, dry and intact.  Psychiatric: Mood and affect are normal. Speech and behavior are  normal.   ____________________________________________   INITIAL IMPRESSION / ASSESSMENT AND PLAN / ED COURSE  Pertinent labs & imaging results that were available during my care of the patient were reviewed by me and considered in my medical decision making (see chart for details).  The patient presents the emergency department with right flank pain ongoing for the past 5 days, worse last night. Per patient she was diagnosed with a kidney stone 3 days ago by CT scan. We'll attempt to obtain a copy of the CT scan. We will check labs, treat discomfort and nausea and close to monitor in the emergency department.  I was able to obtain the patient's outpatient CT scan performed 06/26/16. The CT read shows a 3 mm distal ureteral stone. In the reading states no left ureteral stone, however the impression is distal left ureteral stone, patient's pain is all right flank,  which would lead me to believe that this was an error in dictation. Regardless the patient has a verified 3 mm stone on CT scan. Patient's urinalysis shows too numerous to count white blood cells, with many bacteria. White blood cell count is normal. Given the patient's increased pain/discomfort we will discuss this with urology for further recommendations.  Discussed with Dr. Noni Saupe of urology, believes the patient is safe for outpatient treatment. We will discharge with Toradol, and addition to her Percocet which she has at home. Patient has 8 more days of Bactrim. I discussed very strict return precautions for any fever, otherwise urology follow-up. Patient is agreeable to plan.  ____________________________________________   FINAL CLINICAL IMPRESSION(S) / ED DIAGNOSES  Right flank pain Ureterolithiasis   Harvest Dark, MD 06/29/16 [phone]

## 2016-06-29 NOTE — ED Triage Notes (Signed)
Pt c/o right flank pain. Has had some dysuria. Frequent UTI and hx kidney stones. On cipro for UTI beginning of week. Thinks has another stone. Denies hematuria. Fevers up to 101 despite abx since Monday.

## 2016-06-29 NOTE — ED Notes (Signed)
Pt verbalized understanding of discharge instructions. NAD at this time. 

## 2016-06-29 NOTE — Discharge Instructions (Signed)
Please take your medication as needed for discomfort. You may also take your Percocet as prescribed by your doctor. Please complete your course of antibiotics as prescribed by your doctor. Return to the emergency department immediately for any fever 100.4 or higher.

## 2016-07-01 LAB — URINE CULTURE: Culture: >=100000 — AB

## 2016-07-02 ENCOUNTER — Observation Stay
Admission: AD | Admit: 2016-07-02 | Discharge: 2016-07-04 | Disposition: A | Discharge status: Home / Self Care | Payer: Managed Care, Other (non HMO) | Source: Ambulatory Visit | Attending: Urology | Admitting: Urology

## 2016-07-02 ENCOUNTER — Encounter: Payer: Self-pay | Admitting: *Deleted

## 2016-07-02 DIAGNOSIS — I509 Heart failure, unspecified: Secondary | ICD-10-CM

## 2016-07-02 DIAGNOSIS — N201 Calculus of ureter: Secondary | ICD-10-CM | POA: Diagnosis present

## 2016-07-02 DIAGNOSIS — Z87442 Personal history of urinary calculi: Secondary | ICD-10-CM | POA: Insufficient documentation

## 2016-07-02 DIAGNOSIS — M797 Fibromyalgia: Secondary | ICD-10-CM | POA: Insufficient documentation

## 2016-07-02 DIAGNOSIS — K219 Gastro-esophageal reflux disease without esophagitis: Secondary | ICD-10-CM | POA: Insufficient documentation

## 2016-07-02 DIAGNOSIS — F418 Other specified anxiety disorders: Secondary | ICD-10-CM | POA: Insufficient documentation

## 2016-07-02 DIAGNOSIS — Z90710 Acquired absence of both cervix and uterus: Secondary | ICD-10-CM | POA: Insufficient documentation

## 2016-07-02 DIAGNOSIS — N390 Urinary tract infection, site not specified: Secondary | ICD-10-CM | POA: Diagnosis not present

## 2016-07-02 DIAGNOSIS — N132 Hydronephrosis with renal and ureteral calculous obstruction: Principal | ICD-10-CM | POA: Insufficient documentation

## 2016-07-02 DIAGNOSIS — E039 Hypothyroidism, unspecified: Secondary | ICD-10-CM | POA: Diagnosis not present

## 2016-07-02 DIAGNOSIS — A419 Sepsis, unspecified organism: Secondary | ICD-10-CM | POA: Diagnosis present

## 2016-07-02 DIAGNOSIS — Z87891 Personal history of nicotine dependence: Secondary | ICD-10-CM | POA: Diagnosis not present

## 2016-07-02 DIAGNOSIS — N23 Unspecified renal colic: Secondary | ICD-10-CM | POA: Diagnosis present

## 2016-07-02 DIAGNOSIS — Z79899 Other long term (current) drug therapy: Secondary | ICD-10-CM | POA: Insufficient documentation

## 2016-07-02 DIAGNOSIS — Z9049 Acquired absence of other specified parts of digestive tract: Secondary | ICD-10-CM | POA: Insufficient documentation

## 2016-07-02 DIAGNOSIS — M1990 Unspecified osteoarthritis, unspecified site: Secondary | ICD-10-CM | POA: Insufficient documentation

## 2016-07-02 HISTORY — DX: Sepsis, unspecified organism: A41.9

## 2016-07-02 LAB — PROCALCITONIN: Procalcitonin: 0.11 ng/mL

## 2016-07-02 LAB — CBC WITH DIFFERENTIAL/PLATELET
Basophils Absolute: 0 10*3/uL (ref 0–0.1)
Basophils Relative: 0 %
EOS PCT: 1 %
Eosinophils Absolute: 0.1 10*3/uL (ref 0–0.7)
HCT: 29.4 % — ABNORMAL LOW (ref 35.0–47.0)
Hemoglobin: 10.4 g/dL — ABNORMAL LOW (ref 12.0–16.0)
LYMPHS ABS: 1 10*3/uL (ref 1.0–3.6)
LYMPHS PCT: 14 %
MCH: 30.8 pg (ref 26.0–34.0)
MCHC: 35.3 g/dL (ref 32.0–36.0)
MCV: 87.5 fL (ref 80.0–100.0)
MONO ABS: 0.6 10*3/uL (ref 0.2–0.9)
Monocytes Relative: 9 %
Neutro Abs: 5.5 10*3/uL (ref 1.4–6.5)
Neutrophils Relative %: 76 %
PLATELETS: 227 10*3/uL (ref 150–440)
RBC: 3.37 MIL/uL — ABNORMAL LOW (ref 3.80–5.20)
RDW: 13.2 % (ref 11.5–14.5)
WBC: 7.3 10*3/uL (ref 3.6–11.0)

## 2016-07-02 LAB — COMPREHENSIVE METABOLIC PANEL
ALT: 12 U/L — ABNORMAL LOW (ref 14–54)
AST: 17 U/L (ref 15–41)
Albumin: 2.9 g/dL — ABNORMAL LOW (ref 3.5–5.0)
Alkaline Phosphatase: 58 U/L (ref 38–126)
Anion gap: 5 (ref 5–15)
BUN: 12 mg/dL (ref 6–20)
CHLORIDE: 110 mmol/L (ref 101–111)
CO2: 22 mmol/L (ref 22–32)
Calcium: 7.9 mg/dL — ABNORMAL LOW (ref 8.9–10.3)
Creatinine, Ser: 1.3 mg/dL — ABNORMAL HIGH (ref 0.44–1.00)
GFR, EST AFRICAN AMERICAN: 51 mL/min — AB (ref >60)
GFR, EST NON AFRICAN AMERICAN: 44 mL/min — AB (ref >60)
Glucose, Bld: 121 mg/dL — ABNORMAL HIGH (ref 65–99)
POTASSIUM: 3.9 mmol/L (ref 3.5–5.1)
Sodium: 137 mmol/L (ref 135–145)
Total Bilirubin: 0.6 mg/dL (ref 0.3–1.2)
Total Protein: 5.9 g/dL — ABNORMAL LOW (ref 6.5–8.1)

## 2016-07-02 LAB — LACTIC ACID, PLASMA
LACTIC ACID, VENOUS: 0.8 mmol/L (ref 0.5–1.9)
LACTIC ACID, VENOUS: 0.6 mmol/L (ref 0.5–1.9)

## 2016-07-02 LAB — APTT: aPTT: 36 seconds (ref 24–36)

## 2016-07-02 LAB — PROTIME-INR
INR: 1.07
PROTHROMBIN TIME: 13.9 s (ref 11.4–15.2)

## 2016-07-02 MED ORDER — DEXTROSE 5 % IV SOLN
1.0000 g | INTRAVENOUS | Status: DC
  Filled 2016-07-02: qty 10

## 2016-07-02 MED ORDER — LEVOTHYROXINE SODIUM 75 MCG PO TABS
175.0000 ug | ORAL_TABLET | Freq: Every day | ORAL | Status: DC
  Administered 2016-07-03 – 2016-07-04 (×2): 175 ug via ORAL
  Filled 2016-07-02 (×2): qty 1

## 2016-07-02 MED ORDER — SODIUM CHLORIDE 0.9 % IV BOLUS (SEPSIS)
1000.0000 mL | Freq: Once | INTRAVENOUS | Status: AC
  Administered 2016-07-02: 1000 mL via INTRAVENOUS

## 2016-07-02 MED ORDER — HYDROMORPHONE HCL 1 MG/ML IJ SOLN
1.0000 mg | INTRAMUSCULAR | Status: DC | PRN
  Administered 2016-07-02 – 2016-07-04 (×2): 1 mg via INTRAVENOUS
  Filled 2016-07-02 (×2): qty 1

## 2016-07-02 MED ORDER — PANTOPRAZOLE SODIUM 40 MG PO TBEC: 40.0000 mg | DELAYED_RELEASE_TABLET | Freq: Every day | ORAL | Status: DC

## 2016-07-02 MED ORDER — CEFTRIAXONE SODIUM 2 G IJ SOLR
2.0000 g | Freq: Once | INTRAMUSCULAR | Status: AC
  Administered 2016-07-02: 2 g via INTRAVENOUS
  Filled 2016-07-02: qty 2

## 2016-07-02 MED ORDER — ESCITALOPRAM OXALATE 10 MG PO TABS
20.0000 mg | ORAL_TABLET | ORAL | Status: DC
  Administered 2016-07-04: 20 mg via ORAL
  Filled 2016-07-02: qty 2

## 2016-07-02 MED ORDER — GENTAMICIN IN SALINE 1.6-0.9 MG/ML-% IV SOLN
80.0000 mg | Freq: Three times a day (TID) | INTRAVENOUS | Status: DC
  Administered 2016-07-02 – 2016-07-03 (×3): 80 mg via INTRAVENOUS
  Filled 2016-07-02 (×5): qty 50

## 2016-07-02 MED ORDER — VITAMIN D 1000 UNITS PO TABS
1000.0000 [IU] | ORAL_TABLET | Freq: Every day | ORAL | Status: DC
  Filled 2016-07-02: qty 1

## 2016-07-02 MED ORDER — SODIUM CHLORIDE 0.9 % IV BOLUS (SEPSIS)
500.0000 mL | Freq: Once | INTRAVENOUS | Status: AC
  Administered 2016-07-02: 500 mL via INTRAVENOUS

## 2016-07-02 MED ORDER — ALPRAZOLAM 0.25 MG PO TABS
0.2500 mg | ORAL_TABLET | Freq: Every day | ORAL | Status: DC
  Administered 2016-07-03: 0.25 mg via ORAL
  Filled 2016-07-02 (×2): qty 1

## 2016-07-02 MED ORDER — PROMETHAZINE HCL 25 MG/ML IJ SOLN: 12.5000 mg | INTRAMUSCULAR | Status: DC | PRN

## 2016-07-02 MED ORDER — GABAPENTIN 300 MG PO CAPS
300.0000 mg | ORAL_CAPSULE | Freq: Every day | ORAL | Status: DC
  Filled 2016-07-02: qty 1

## 2016-07-02 MED ORDER — ALUM & MAG HYDROXIDE-SIMETH 200-200-20 MG/5ML PO SUSP
15.0000 mL | ORAL | Status: DC | PRN
  Administered 2016-07-02: 15 mL via ORAL
  Filled 2016-07-02: qty 30

## 2016-07-02 MED ORDER — ATORVASTATIN CALCIUM 20 MG PO TABS: 20.0000 mg | ORAL_TABLET | Freq: Every day | ORAL | Status: DC

## 2016-07-02 MED ORDER — CEFAZOLIN IN D5W 1 GM/50ML IV SOLN
1.0000 g | Freq: Four times a day (QID) | INTRAVENOUS | Status: DC
  Filled 2016-07-02 (×5): qty 50

## 2016-07-02 MED ORDER — SODIUM CHLORIDE 0.9 % IV BOLUS (SEPSIS): 500.0000 mL | Freq: Once | INTRAVENOUS | Status: DC

## 2016-07-02 MED ORDER — ACETAMINOPHEN 325 MG PO TABS
650.0000 mg | ORAL_TABLET | ORAL | Status: DC | PRN
  Administered 2016-07-02 – 2016-07-03 (×3): 650 mg via ORAL
  Filled 2016-07-02 (×2): qty 2

## 2016-07-02 MED ORDER — ARIPIPRAZOLE 2 MG PO TABS
2.0000 mg | ORAL_TABLET | Freq: Every day | ORAL | Status: DC
  Administered 2016-07-03: 2 mg via ORAL
  Filled 2016-07-02 (×2): qty 1

## 2016-07-02 MED ORDER — DEXTROSE-NACL 5-0.9 % IV SOLN
INTRAVENOUS | Status: DC
  Administered 2016-07-02 – 2016-07-03 (×5): via INTRAVENOUS

## 2016-07-02 NOTE — Progress Notes (Signed)
Primary RN notified MD about needed clarification for admission orders and procedures. Asked OR nurse for help with reading transcribed order as suggested by MD, OR referred back to MD.   Charge RN contacted MD for order clarification and gave IT contact information to assist MD insertion. Patient lying in bed. Continue to assess.

## 2016-07-02 NOTE — H&P (Signed)
NAMEJENNAVIEVE, MANCINE              ACCOUNT NO.:  [id number]  MEDICAL RECORD NO.:  [id number]  LOCATION:                                 FACILITY:  PHYSICIAN:  Maryan Puls          DATE OF BIRTH:  October 31, 1956  DATE OF ADMISSION:  07/02/2016 DATE OF DISCHARGE:                            HISTORY AND PHYSICAL   CHIEF COMPLAINT:  Flank pain.  HISTORY OF THE PRESENT ILLNESS:  Mrs. Evelyn Bentley is a 60 year old Caucasian female with sudden onset of right flank pain associated with nausea and vomiting last week.  She had a CT scan on June 26, 2016, which indicated right hydronephrosis and distal 3 mm stone.  She presented initially with fever, chills, and dysuria on June 20, 2016, and was started on a course of Cipro.  Fever and chills did not resolve, and she was converted to Septra DS on June 27, 2016.  She continues to have fever and chills.  She comes in now for IV fluids and antibiotics, and then proceed with ureteroscopic ureterolithotomy and possible stent placement.  ALLERGIES:  NO DRUG ALLERGIES.  CURRENT MEDICATIONS: 1. Tamsulosin. 2. Septra DS. 3. Toradol. 4. Levoxyl. 5. Lyrica. 6. Percocet.  SURGICAL HISTORY: 1. C-spine repair in 1997. 2. Cholecystectomy in 2000. 3. Gastric fundoplication [id number]. 4. C-spine repair 2007. 5. Hysterectomy April 2017.  SOCIAL HISTORY:  The patient denied alcohol use.  She quit smoking in 2015 with a 40 pack-year history.  FAMILY HISTORY:  Negative for urological disease and kidney stones.  PAST AND CURRENT MEDICAL CONDITIONS: 1. Hypothyroidism. 2. GERD. 3. History of kidney stones-passed the kidney stone many years ago.  REVIEW OF SYSTEMS:  The patient denied chest pain, heart disease, diabetes, or stroke.  PHYSICAL EXAMINATION:  GENERAL:  Well-nourished white female in some distress due to pain. HEENT:  Sclerae were clear. NECK:  Supple.  No palpable cervical adenopathy. LUNGS:  Clear to auscultation. CARDIOVASCULAR:   Regular rhythm and rate without audible murmurs. ABDOMEN:  Soft, nontender abdomen. GU:  Deferred. RECTAL:  Deferred. NEUROMUSCULAR:  Grossly intact.  IMPRESSION: 1. Probable urinary sepsis. 2. Right ureterolithiasis with hydronephrosis.  PLAN: 1. IV fluids and antibiotics. 2. Right ureteroscopic ureterolithotomy with holmium laser lithotripsy     and stent placement, probably tomorrow morning.    ______________________________ Maryan Puls   ______________________________ Maryan Puls    MW/MEDQ  D:  07/02/2016  T:  07/02/2016  Job:  [id number]

## 2016-07-02 NOTE — Plan of Care (Signed)
Problem: Safety: Goal: Ability to remain free from injury will improve Outcome: Progressing Educated patient/family how to call out for help if needed

## 2016-07-02 NOTE — Progress Notes (Signed)
notifed MD of patient VSS. New orders received. Consult called to hospitalist. Tylenol given, patient made NPO for possible procedure. Continue to assess.

## 2016-07-02 NOTE — H&P (Deleted)
  The note originally documented on this encounter has been moved the the encounter in which it belongs.  

## 2016-07-02 NOTE — Progress Notes (Signed)
60 yo female  Allergies: Codeine Visit Date: 8/18 CC:  Flank pain Culture type: Urine Culture results: E. coli Original abx given: None Original abx sensitive, intermediate, or resistant: Recommended abx: IV Cefazolin  Physician: Daiva Nakayama pt: contacted physician because pt is currently admitted with no abx.  Prescription:MD wants Cefazolin 1g Q6hr   Loree Fee  2:56 PM 07/02/2016

## 2016-07-02 NOTE — Progress Notes (Signed)
Pharmacy Antibiotic Note  Evelyn Bentley is a 60 y.o. female admitted on 07/02/2016 with UTI.  Pharmacy has been consulted for ceftriaxone dosing.  Plan: Patient is receiving ceftriaxone 2mg  IV  x1 dose this evening. Will start patient on ceftriaxone 1mg  IV daily starting tomorrow.    Height: 5' (152.4 cm) Weight: 171 lb 9 oz (77.8 kg) IBW/kg (Calculated) : 45.5  Temp (24hrs), Avg:100.1 F (37.8 C), Min:98.3 F (36.8 C), Max:102 F (38.9 C)   Recent Labs Lab 06/29/16 0835  WBC 9.6  CREATININE 1.02*    Estimated Creatinine Clearance: 55.4 mL/min (by C-G formula based on SCr of 1.02 mg/dL).    Allergies  Allergen Reactions  . Codeine Nausea Only    Antimicrobials this admission: 08/21 ceftriaxone  >>    Microbiology results: 8/21 BCx: pending 8/21 UCx: pending  Thank you for allowing pharmacy to be a part of this patient's care.  Nancy Fetter, PharmD Clinical Pharmacist 07/02/2016 6:48 PM

## 2016-07-02 NOTE — Consult Note (Signed)
[facility] [place] NAME: Evelyn Bentley    MR#:  [id number]  DATE OF BIRTH:  01/23/1956  DATE OF ADMISSION:  07/02/2016  PRIMARY CARE PHYSICIAN: Perrin Maltese, MD   REQUESTING/REFERRING PHYSICIAN: Rogers Blocker -urology  CHIEF COMPLAINT:  Fever, flank pain  HISTORY OF PRESENT ILLNESS:  Evelyn Bentley  is a 60 y.o. female with a known history of History of hypothyroidism unspecified originally admitted 07/02/2016 by urology service and given evidence of right-sided hydronephrosis and distal 3 mm stone. She was placed on Cipro 06/20/2016 which is followed by Bactrim 06/27/2016-and she continued to have fevers and chills. Patient plan was admission with ureteral lithotomy plus or minus stent placement. While in the hospital noted to be febrile having signs/symptoms sepsis-hospitalists called for medical evaluation. Patient complains of fever, chills, flank pain right-sided 7/10 no worsening or relieving factors nonradiating  PAST MEDICAL HISTORY:   Past Medical History:  Diagnosis Date  . Anxiety   . Arthritis   . Bursitis    Rt hip  . Complication of anesthesia    nausea  . Depression   . Fibromyalgia   . GERD (gastroesophageal reflux disease)   . Hypercholesteremia   . Hypertension   . Hypothyroidism   . Thyroid disease     PAST SURGICAL HISTOIRY:   Past Surgical History:  Procedure Laterality Date  . CHOLECYSTECTOMY    . LAPAROSCOPIC HYSTERECTOMY Bilateral 02/28/2016   Procedure: HYSTERECTOMY TOTAL LAPAROSCOPIC / BSO;  Surgeon: Honor Loh Ward, MD;  Location: [facility] ORS;  Service: Gynecology;  Laterality: Bilateral;  . NECK SURGERY    . NISSEN FUNDOPLICATION    . TUBAL LIGATION      SOCIAL HISTORY:   Social History  Substance Use Topics  . Smoking status: Former Smoker    Start date: 11/14/1974    Quit date: 05/14/2014  . Smokeless tobacco: Never Used  . Alcohol use 0.0 - 0.6 oz/week     Comment: social rare    FAMILY  HISTORY:   Family History  Problem Relation Age of Onset  . Depression Mother   . Anxiety disorder Mother     DRUG ALLERGIES:   Allergies  Allergen Reactions  . Codeine Nausea Only    REVIEW OF SYSTEMS:  CONSTITUTIONAL: Positive fever, fatigue or weakness.  EYES: No blurred or double vision.  EARS, NOSE, AND THROAT: No tinnitus or ear pain.  RESPIRATORY: No cough, shortness of breath, wheezing or hemoptysis.  CARDIOVASCULAR: No chest pain, orthopnea, edema.  GASTROINTESTINAL: No nausea, vomiting, diarrhea or abdominal pain. Positive flank pain GENITOURINARY: Positive dysuria, denies hematuria.  ENDOCRINE: No polyuria, nocturia,  HEMATOLOGY: No anemia, easy bruising or bleeding SKIN: No rash or lesion. MUSCULOSKELETAL: No joint pain or arthritis.   NEUROLOGIC: No tingling, numbness, weakness.  PSYCHIATRY: No anxiety or depression.   MEDICATIONS AT HOME:   Prior to Admission medications   Medication Sig Start Date End Date Taking? Authorizing Provider  ALPRAZolam (XANAX) 0.25 MG tablet Take 1 tablet (0.25 mg total) by mouth at bedtime. 02/14/16  Yes Rainey Pines, MD  ARIPiprazole (ABILIFY) 2 MG tablet Take 1 tablet (2 mg total) by mouth daily. Patient taking differently: Take 2 mg by mouth at bedtime.  02/14/16  Yes Rainey Pines, MD  atorvastatin (LIPITOR) 10 MG tablet Take 20 mg by mouth daily.   Yes Historical Provider, MD  Cholecalciferol (VITAMIN D3) 1000 units CAPS Take 1 capsule by mouth daily.   Yes Historical Provider, MD  escitalopram (LEXAPRO) 20 MG tablet Take 1 tablet (20 mg total) by mouth every morning. 02/14/16  Yes Rainey Pines, MD  fluticasone (FLONASE) 50 MCG/ACT nasal spray Place into the nose.   Yes Historical Provider, MD  gabapentin (NEURONTIN) 300 MG capsule Take 300 mg by mouth at bedtime.    Yes Historical Provider, MD  ibuprofen (ADVIL,MOTRIN) 600 MG tablet Take 1 tablet (600 mg total) by mouth every 6 (six) hours. 02/28/16  Yes Chelsea C Ward, MD  ketorolac  (TORADOL) 10 MG tablet Take 1 tablet (10 mg total) by mouth every 6 (six) hours as needed. 06/29/16  Yes Harvest Dark, MD  levothyroxine (SYNTHROID, LEVOTHROID) 175 MCG tablet  01/03/16  Yes Historical Provider, MD  lisinopril-hydrochlorothiazide (PRINZIDE,ZESTORETIC) 10-12.5 MG tablet Take 1 tablet by mouth daily.  11/10/15  Yes Historical Provider, MD  meloxicam (MOBIC) 15 MG tablet Take by mouth.   Yes Historical Provider, MD  omeprazole (PRILOSEC) 40 MG capsule  01/25/16  Yes Historical Provider, MD  oxyCODONE-acetaminophen (PERCOCET) 5-325 MG tablet Take 1-2 tablets by mouth every 4 (four) hours as needed for severe pain. 02/28/16  Yes Chelsea C Ward, MD  oxyCODONE-acetaminophen (PERCOCET/ROXICET) 5-325 MG tablet Take by mouth.   Yes Historical Provider, MD      VITAL SIGNS:  Blood pressure 124/78, pulse 86, temperature 100 F (37.8 C), temperature source Oral, resp. rate 16, height 5' (1.524 m), weight 77.8 kg (171 lb 9 oz), SpO2 99 %.  PHYSICAL EXAMINATION:  GENERAL:  60 y.o.-year-old patient lying in the bed Ill appearing EYES: Pupils equal, round, reactive to light and accommodation. No scleral icterus. Extraocular muscles intact.  HEENT: Head atraumatic, normocephalic. Oropharynx and nasopharynx clear.  NECK:  Supple, no jugular venous distention. No thyroid enlargement, no tenderness.  LUNGS: Normal breath sounds bilaterally, no wheezing, rales,rhonchi or crepitation. No use of accessory muscles of respiration.  CARDIOVASCULAR: S1, S2 tachycardic. No murmurs, rubs, or gallops.  ABDOMEN: Soft, nontender, nondistended. Bowel sounds present. No organomegaly or mass.  EXTREMITIES: No pedal edema, cyanosis, or clubbing.  NEUROLOGIC: Cranial nerves II through XII are intact. Muscle strength 5/5 in all extremities. Sensation intact. Gait not checked.  PSYCHIATRIC: The patient is alert and oriented x 3.  SKIN: No obvious rash, lesion, or ulcer.   LABORATORY PANEL:   CBC  Recent  Labs Lab 06/29/16 0835  WBC 9.6  HGB 13.2  HCT 38.1  PLT 269   ------------------------------------------------------------------------------------------------------------------  Chemistries   Recent Labs Lab 06/29/16 0835  NA 137  K 4.5  CL 104  CO2 28  GLUCOSE 111*  BUN 12  CREATININE 1.02*  CALCIUM 8.6*  AST 24  ALT 21  ALKPHOS 79  BILITOT 0.8   ------------------------------------------------------------------------------------------------------------------  Cardiac Enzymes No results for input(s): TROPONINI in the last 168 hours. ------------------------------------------------------------------------------------------------------------------  RADIOLOGY:  No results found.  EKG:   Orders placed or performed during the hospital encounter of 02/17/16  . EKG 12-Lead  . EKG 12-Lead    IMPRESSION AND PLAN:   60 year old Caucasian female admitted 07/02/2016 for scheduled ureterolithotomy and possible stent placement given evidence of right sided hydronephrosis and distal 3 mm stone meeting septic criteria  1.Sepsis, meeting septic criteria by tachycardia, temperature, present on arrival. Source of Escherichia coli UTI-question bacteremia Code sepsis initiated. Panculture. Broad-spectrum antibiotics including ceftriaxone and taper antibiotics when culture data returns.  Ordered to receive a 30 mL/kg IV fluid bolus. Continue IV fluid hydration to keep mean arterial pressure greater than 65. may require pressor therapy  if blood pressure worsens. We will repeat lactic acid if the initial is greater than 2.2.   2. Essential hypertension hold ACE inhibitor/thiazide until labs obtained to assess renal function 3. Hypothyroidism unspecified: Synthroid    All the records are reviewed and case discussed with Consulting provider. Management plans discussed with the patient, family and they are in agreement.  CODE STATUS: Full  TOTAL TIME TAKING CARE OF THIS PATIENT: 45  minutes.    Taysom Glymph,  Karenann Cai.D on 07/02/2016 at 6:45 PM  Between 7am to 6pm - Pager - [phone]  After 6pm: House Pager: - [facility]  Office  [phone]  CC: Primary care Physician: Perrin Maltese, MD

## 2016-07-03 ENCOUNTER — Observation Stay: Payer: Managed Care, Other (non HMO) | Admitting: Anesthesiology

## 2016-07-03 ENCOUNTER — Observation Stay: Payer: Managed Care, Other (non HMO)

## 2016-07-03 ENCOUNTER — Encounter: Payer: Self-pay | Admitting: Anesthesiology

## 2016-07-03 ENCOUNTER — Encounter
Admission: AD | Disposition: A | Discharge status: Home / Self Care | Payer: Self-pay | Source: Ambulatory Visit | Attending: Urology

## 2016-07-03 ENCOUNTER — Ambulatory Visit: Admit: 2016-07-03 | Payer: Managed Care, Other (non HMO) | Admitting: Urology

## 2016-07-03 DIAGNOSIS — N132 Hydronephrosis with renal and ureteral calculous obstruction: Secondary | ICD-10-CM | POA: Diagnosis not present

## 2016-07-03 HISTORY — PX: URETEROSCOPY WITH HOLMIUM LASER LITHOTRIPSY: SHX6645

## 2016-07-03 LAB — SURGICAL PCR SCREEN
MRSA, PCR: NEGATIVE
Staphylococcus aureus: NEGATIVE

## 2016-07-03 SURGERY — URETEROSCOPY, WITH LITHOTRIPSY USING HOLMIUM LASER
Anesthesia: General | Laterality: Left

## 2016-07-03 MED ORDER — LIDOCAINE HCL (CARDIAC) 20 MG/ML IV SOLN
INTRAVENOUS | Status: DC | PRN
  Administered 2016-07-03: 100 mg via INTRAVENOUS

## 2016-07-03 MED ORDER — BELLADONNA ALKALOIDS-OPIUM 16.2-60 MG RE SUPP: RECTAL | Status: DC | PRN

## 2016-07-03 MED ORDER — SUCCINYLCHOLINE CHLORIDE 20 MG/ML IJ SOLN
INTRAMUSCULAR | Status: DC | PRN
  Administered 2016-07-03: 80 mg via INTRAVENOUS

## 2016-07-03 MED ORDER — DEXTROSE 5 % IV SOLN: 1000.0000 mg | Freq: Once | INTRAVENOUS | Status: DC

## 2016-07-03 MED ORDER — ONDANSETRON HCL 4 MG/2ML IJ SOLN
INTRAMUSCULAR | Status: DC | PRN
  Administered 2016-07-03: 4 mg via INTRAVENOUS

## 2016-07-03 MED ORDER — SODIUM CHLORIDE 0.9 % IV SOLN
INTRAVENOUS | Status: DC
  Administered 2016-07-03: 13:00:00 via INTRAVENOUS

## 2016-07-03 MED ORDER — KETOROLAC TROMETHAMINE 30 MG/ML IJ SOLN
30.0000 mg | Freq: Once | INTRAMUSCULAR | Status: AC
  Administered 2016-07-03: 30 mg via INTRAVENOUS
  Filled 2016-07-03: qty 1

## 2016-07-03 MED ORDER — FUROSEMIDE 10 MG/ML IJ SOLN
40.0000 mg | Freq: Once | INTRAMUSCULAR | Status: AC
Start: 2016-07-03 — End: 2016-07-03
  Administered 2016-07-03: 40 mg via INTRAVENOUS
  Filled 2016-07-03: qty 4

## 2016-07-03 MED ORDER — FENTANYL CITRATE (PF) 100 MCG/2ML IJ SOLN
INTRAMUSCULAR | Status: DC | PRN
  Administered 2016-07-03 (×2): 50 ug via INTRAVENOUS

## 2016-07-03 MED ORDER — SODIUM CHLORIDE 0.9 % IV BOLUS (SEPSIS)
1000.0000 mL | Freq: Once | INTRAVENOUS | Status: AC
  Administered 2016-07-03: 1000 mL via INTRAVENOUS

## 2016-07-03 MED ORDER — ROCURONIUM BROMIDE 100 MG/10ML IV SOLN
INTRAVENOUS | Status: DC | PRN
  Administered 2016-07-03 (×4): 10 mg via INTRAVENOUS

## 2016-07-03 MED ORDER — CEFAZOLIN IN D5W 1 GM/50ML IV SOLN
1.0000 g | Freq: Once | INTRAVENOUS | Status: AC
  Administered 2016-07-03: 1 g via INTRAVENOUS

## 2016-07-03 MED ORDER — MIDAZOLAM HCL 2 MG/2ML IJ SOLN
INTRAMUSCULAR | Status: DC | PRN
Start: 2016-07-03 — End: 2016-07-03
  Administered 2016-07-03: 2 mg via INTRAVENOUS

## 2016-07-03 MED ORDER — ONDANSETRON HCL 4 MG/2ML IJ SOLN: 4.0000 mg | Freq: Once | INTRAMUSCULAR | Status: DC | PRN

## 2016-07-03 MED ORDER — SUGAMMADEX SODIUM 500 MG/5ML IV SOLN
INTRAVENOUS | Status: DC | PRN
  Administered 2016-07-03: 310 mg via INTRAVENOUS

## 2016-07-03 MED ORDER — PROPOFOL 10 MG/ML IV BOLUS
INTRAVENOUS | Status: DC | PRN
  Administered 2016-07-03: 150 mg via INTRAVENOUS
  Administered 2016-07-03: 20 mg via INTRAVENOUS

## 2016-07-03 MED ORDER — ONDANSETRON HCL 4 MG/2ML IJ SOLN
4.0000 mg | Freq: Four times a day (QID) | INTRAMUSCULAR | Status: DC | PRN
  Administered 2016-07-03: 4 mg via INTRAVENOUS
  Filled 2016-07-03: qty 2

## 2016-07-03 MED ORDER — DEXTROSE 5 % IV SOLN
2.0000 g | INTRAVENOUS | Status: DC
  Filled 2016-07-03: qty 2

## 2016-07-03 MED ORDER — FENTANYL CITRATE (PF) 100 MCG/2ML IJ SOLN: 25.0000 ug | INTRAMUSCULAR | Status: DC | PRN

## 2016-07-03 MED ORDER — LIDOCAINE HCL 2 % EX GEL
CUTANEOUS | Status: DC | PRN
  Administered 2016-07-03: 1 via URETHRAL

## 2016-07-03 MED ORDER — PHENYLEPHRINE HCL 10 MG/ML IJ SOLN
INTRAMUSCULAR | Status: DC | PRN
  Administered 2016-07-03 (×4): 100 ug via INTRAVENOUS

## 2016-07-03 MED ORDER — DEXAMETHASONE SODIUM PHOSPHATE 10 MG/ML IJ SOLN
INTRAMUSCULAR | Status: DC | PRN
  Administered 2016-07-03: 10 mg via INTRAVENOUS

## 2016-07-03 MED ORDER — GENTAMICIN IN SALINE 1.6-0.9 MG/ML-% IV SOLN
80.0000 mg | Freq: Two times a day (BID) | INTRAVENOUS | Status: DC
  Filled 2016-07-03: qty 50

## 2016-07-03 SURGICAL SUPPLY — 34 items
BAG DRAIN CYSTO-URO LG1000N (MISCELLANEOUS) ×3 IMPLANT
CNTNR SPEC 2.5X3XGRAD LEK (MISCELLANEOUS) ×1
CONRAY 43 FOR UROLOGY 50M (MISCELLANEOUS) ×3 IMPLANT
CONT SPEC 4OZ STER OR WHT (MISCELLANEOUS) ×2
CONT SPEC 4OZ STRL OR WHT (MISCELLANEOUS) ×1
CONTAINER SPEC 2.5X3XGRAD LEK (MISCELLANEOUS) ×1 IMPLANT
DEVICE INFLATION 26 ENCORE (MISCELLANEOUS) ×2 IMPLANT
FEE RENTAL LASER STANDBY HOLM (MISCELLANEOUS) ×1 IMPLANT
FEE TECHNICIAN ONLY PER HOUR (MISCELLANEOUS) ×1 IMPLANT
FIBER LASER 365 (Laser) ×2 IMPLANT
FIBER LASER LITHO 273 (Laser) ×1 IMPLANT
GLOVE BIO SURGEON STRL SZ7 (GLOVE) ×6 IMPLANT
GLOVE BIO SURGEON STRL SZ7.5 (GLOVE) ×3 IMPLANT
GOWN STRL REUS W/ TWL LRG LVL4 (GOWN DISPOSABLE) ×1 IMPLANT
GOWN STRL REUS W/TWL LRG LVL4 (GOWN DISPOSABLE) ×3
GOWN STRL REUS W/TWL XL LVL4 (GOWN DISPOSABLE) ×3 IMPLANT
GUIDEWIRE STR ZIPWIRE 035X150 (MISCELLANEOUS) ×3 IMPLANT
KIT RM TURNOVER CYSTO AR (KITS) ×3 IMPLANT
LASER HOLMIUM FIBER SU 272UM (MISCELLANEOUS) ×1 IMPLANT
LASER HOLMIUM STANDBY (MISCELLANEOUS) IMPLANT
LASER HOLMIUM SU 940UM (MISCELLANEOUS) ×1 IMPLANT
PACK CYSTO AR (MISCELLANEOUS) ×3 IMPLANT
PREP PVP WINGED SPONGE (MISCELLANEOUS) ×3 IMPLANT
SET CYSTO W/LG BORE CLAMP LF (SET/KITS/TRAYS/PACK) ×3 IMPLANT
SOL .9 NS 3000ML IRR  AL (IV SOLUTION) ×2
SOL .9 NS 3000ML IRR AL (IV SOLUTION) ×1
SOL .9 NS 3000ML IRR UROMATIC (IV SOLUTION) ×1 IMPLANT
SOL PREP PVP 2OZ (MISCELLANEOUS) ×3
SOLUTION PREP PVP 2OZ (MISCELLANEOUS) ×1 IMPLANT
STENT URET 6FRX24 CONTOUR (STENTS) ×2 IMPLANT
SURGILUBE 2OZ TUBE FLIPTOP (MISCELLANEOUS) ×3 IMPLANT
SYRINGE 10CC LL (SYRINGE) ×2 IMPLANT
URETL UROMAX ULTRA 21F 7X4 (MISCELLANEOUS) ×2 IMPLANT
WATER STERILE IRR 1000ML POUR (IV SOLUTION) ×3 IMPLANT

## 2016-07-03 NOTE — Progress Notes (Signed)
Dr. Benjie Karvonen notified of BP (!) 112/47   Pulse 94   Temp 99.6 F (37.6 C) (Oral)   Resp 20   Ht 5' (1.524 m)   Wt 77.8 kg (171 lb 9 oz)   SpO2 100%   BMI 33.51 kg/m . MD also notified that pt experiencing chills and shaking. MD order to give tylenol.

## 2016-07-03 NOTE — Progress Notes (Signed)
[facility] NAME: Evelyn Bentley    MR#:  [id number]  DATE OF BIRTH:  03-09-1956  SUBJECTIVE:   Patient had low blood pressures overnight. She has received 5 L of fluids. She was apparently short of breath and given Lasix earlier this morning. She is not complaint shortness of breath, dizziness, chest pain. She has had chills.  REVIEW OF SYSTEMS:    Review of Systems  Constitutional: Positive for chills and fever. Negative for malaise/fatigue.  HENT: Negative.  Negative for ear discharge, ear pain, hearing loss, nosebleeds and sore throat.   Eyes: Negative.  Negative for blurred vision and pain.  Respiratory: Negative.  Negative for cough, hemoptysis, shortness of breath and wheezing.   Cardiovascular: Negative.  Negative for chest pain, palpitations and leg swelling.  Gastrointestinal: Negative.  Negative for abdominal pain, blood in stool, diarrhea, nausea and vomiting.       Groin pain  Genitourinary: Negative.  Negative for dysuria.  Musculoskeletal: Negative.  Negative for back pain.  Skin: Negative.   Neurological: Negative for dizziness, tremors, speech change, focal weakness, seizures and headaches.  Endo/Heme/Allergies: Negative.  Does not bruise/bleed easily.  Psychiatric/Behavioral: Negative.  Negative for depression, hallucinations and suicidal ideas.    Tolerating Diet: NPO      DRUG ALLERGIES:   Allergies  Allergen Reactions  . Codeine Nausea Only    VITALS:  Blood pressure (!) 112/47, pulse 94, temperature 99.6 F (37.6 C), temperature source Oral, resp. rate 20, height 5' (1.524 m), weight 77.8 kg (171 lb 9 oz), SpO2 100 %.  PHYSICAL EXAMINATION:   Physical Exam  Constitutional: She is oriented to person, place, and time and well-developed, well-nourished, and in no distress. No distress.  HENT:  Head: Normocephalic.  Eyes: No scleral icterus.  Neck: Normal range of motion. Neck supple. No JVD  present. No tracheal deviation present.  Cardiovascular: Normal rate, regular rhythm and normal heart sounds.  Exam reveals no gallop and no friction rub.   No murmur heard. Pulmonary/Chest: Effort normal and breath sounds normal. No respiratory distress. She has no wheezes. She has no rales. She exhibits no tenderness.  Abdominal: Soft. Bowel sounds are normal. She exhibits no distension and no mass. There is no tenderness. There is no rebound and no guarding.  Musculoskeletal: Normal range of motion. She exhibits no edema.  Neurological: She is alert and oriented to person, place, and time.  Skin: Skin is warm. No rash noted. No erythema.  Psychiatric: Affect and judgment normal.      LABORATORY PANEL:   CBC  Recent Labs Lab 07/02/16 1910  WBC 7.3  HGB 10.4*  HCT 29.4*  PLT 227   ------------------------------------------------------------------------------------------------------------------  Chemistries   Recent Labs Lab 07/02/16 1910  NA 137  K 3.9  CL 110  CO2 22  GLUCOSE 121*  BUN 12  CREATININE 1.30*  CALCIUM 7.9*  AST 17  ALT 12*  ALKPHOS 58  BILITOT 0.6   ------------------------------------------------------------------------------------------------------------------  Cardiac Enzymes No results for input(s): TROPONINI in the last 168 hours. ------------------------------------------------------------------------------------------------------------------  RADIOLOGY:  No results found.   ASSESSMENT AND PLAN:    60 year old female with history of hypothyroidism who is admitted by the urological service for right-sided had nephrosis and a distal 3 mm stone who now has sepsis.  1. Sepsis with hypotension: Patient has tachycardia, fever and hypotension. Sepsis is due to urinary tract infection, Escherichia coli.  At this time map is greater than 65  and patient does not need pressors.  Blood pressure has improved. Continue Rocephin.   2.  Essential hypertension: Due to sepsis and normalization of blood pressure I would continue to hold outpatient medications.  3 Hypothyroid: Continue Synthroid.  Patient may be able to go today to surgery.  Discussed with Dr. Yves Dill  PICU for allowing me to participate in the care of your patient. I will continue to follow   Management plans discussed with the patient and she is in agreement.  CODE STATUS: full  TOTAL TIME TAKING CARE OF THIS PATIENT: 30 minutes.     POSSIBLE D/C 2-3 days, DEPENDING ON CLINICAL CONDITION.   Ariyel Jeangilles M.D on 07/03/2016 at 11:46 AM  Between 7am to 6pm - Pager - [phone] After 6pm go to [email or web address] - password [email or web address] [facility]  Office  [phone]  CC: Primary care physician; Perrin Maltese, MD  Note: This dictation was prepared with Dragon dictation along with smaller phrase technology. Any transcriptional errors that result from this process are unintentional.

## 2016-07-03 NOTE — Progress Notes (Signed)
Dr. pyreddy notified of pt increased SOB and wheezing - L side after boluses given. Pt received 5L total fluid this shift. O2 sats 100% RA. Received orders for 40mg  Lasix x 1 now. MD is satisfied if pt BP can stay > 90/60.

## 2016-07-03 NOTE — Progress Notes (Addendum)
Evelyn Bentley is a 60 y.o. female patient. 1. Sepsis ([facility])   2. CHF (congestive heart failure) (HCC)    Past Medical History:  Diagnosis Date  . Anxiety   . Arthritis   . Bursitis    Rt hip  . Complication of anesthesia    nausea  . Depression   . Fibromyalgia   . GERD (gastroesophageal reflux disease)   . Hypercholesteremia   . Hypertension   . Hypothyroidism   . Thyroid disease    Current Facility-Administered Medications  Medication Dose Route Frequency Provider Last Rate Last Dose  . acetaminophen (TYLENOL) tablet 650 mg  650 mg Oral Q4H PRN Royston Cowper, MD   650 mg at 07/03/16 0342  . ALPRAZolam Duanne Moron) tablet 0.25 mg  0.25 mg Oral QHS Lytle Butte, MD      . alum & mag hydroxide-simeth (MAALOX/MYLANTA) 200-200-20 MG/5ML suspension 15 mL  15 mL Oral Q4H PRN Lytle Butte, MD   15 mL at 07/02/16 2214  . ARIPiprazole (ABILIFY) tablet 2 mg  2 mg Oral QHS Lytle Butte, MD      . atorvastatin (LIPITOR) tablet 20 mg  20 mg Oral Daily Lytle Butte, MD      . cefTRIAXone (ROCEPHIN) 1 g in dextrose 5 % 50 mL IVPB  1 g Intravenous Q24H Sheema M Hallaji, RPH      . cholecalciferol (VITAMIN D) tablet 1,000 Units  1,000 Units Oral Daily Lytle Butte, MD      . dextrose 5 %-0.9 % sodium chloride infusion   Intravenous Continuous Royston Cowper, MD 150 mL/hr at 07/03/16 0224    . escitalopram (LEXAPRO) tablet 20 mg  20 mg Oral BH-q7a Lytle Butte, MD      . gabapentin (NEURONTIN) capsule 300 mg  300 mg Oral QHS Lytle Butte, MD      . gentamicin (GARAMYCIN) IVPB 80 mg  80 mg Intravenous Q8H Royston Cowper, MD   80 mg at 07/02/16 2325  . HYDROmorphone (DILAUDID) injection 1 mg  1 mg Intravenous Q4H PRN Royston Cowper, MD   1 mg at 07/02/16 2044  . levothyroxine (SYNTHROID, LEVOTHROID) tablet 175 mcg  175 mcg Oral QAC breakfast Lytle Butte, MD      . ondansetron [facility]) injection 4 mg  4 mg Intravenous Q6H PRN Alexis Hugelmeyer, DO   4 mg at 07/03/16 0150  . pantoprazole  (PROTONIX) EC tablet 40 mg  40 mg Oral Daily Lytle Butte, MD      . promethazine [facility]) injection 12.5 mg  12.5 mg Intravenous Q4H PRN Royston Cowper, MD      . sodium chloride 0.9 % bolus 500 mL  500 mL Intravenous Once Royston Cowper, MD       Allergies  Allergen Reactions  . Codeine Nausea Only   Active Problems:   Ureterolithiasis   Renal colic on left side   Sepsis due to urinary tract infection (HCC)   Hydronephrosis with obstructing calculus  Blood pressure (!) 112/58, pulse 82, temperature 99 F (37.2 C), temperature source Oral, resp. rate 20, height 5' (1.524 m), weight 77.8 kg (171 lb 9 oz), SpO2 95 %.  Subjective: Pain:  She complains of pain that is moderate.  She reports pain is improving.  Pain is partially controlled.    Objective: General Appearance:  Comfortable.   Vital signs: (most recent): Blood pressure (!) 112/58, pulse 82, temperature 99 F (37.2  C), temperature source Oral, resp. rate 20, height 5' (1.524 m), weight 77.8 kg (171 lb 9 oz), SpO2 95 %.  Fever.   Output: Producing urine.   HEENT: Normal HEENT exam.   Lungs:  Normal effort.   Abdomen: Abdomen is soft.  There is no abdominal tenderness.    Neurological: Patient is alert and oriented to person, place and time.    Assessment: (Blood pressure fluctuating. Possible need for pressors and/or interventional radiology placement of nephrostomy tube if status declines prior to OR).   Plan:  (Spoke with Dr. Genia Harold who will consider need for pressors. At this point the blood pressure is not low enough for pressors. Also spoke with Dr. Golden Circle who states that the vascular lab most likely would not have an opening for nephrostomy tube placement prior to 1 PM. Plan to take the patient to the OR this afternoon for stent placement and ureteroscopy with possible laser lithotripsy).    [name] R 07/03/2016

## 2016-07-03 NOTE — Op Note (Signed)
Preoperative diagnosis: 1. Left ureterolithiasis with hydronephrosis                                             2. Urinary sepsis  Postoperative diagnosis: Same  Procedure: 1. Left ureteroscopic ureterolithotomy with holmium laser lithotripsy                       2. Left double pigtail stent placement                      3. Fluoroscopy  Surgeon: Otelia Limes. Yves Dill MD  Anesthesia: General  Indications:See the history and physical. After informed consent the above procedure(s) were requested     Technique and findings: After adequate general anesthesia been obtain the patient was placed into dorsolithotomy position and the perineum was prepped and draped in the usual fashion. Fluoroscopy confirmed the presence of a left mid ureteral stone measuring 3 x 4 mm. At this point the 21 French cystoscope sheath was advanced to the bladder with the obturator in place. The the cystoscope was coupled camera and then placed in the sheath. Bladder was thoroughly inspected. Both ureteral orifices were identified and had clear reflux. Bladder lesions were identified. At this point a 0.035 Glidewire was advanced into the left orifice and passed beyond the stone and curled into the renal pelvis. A 7 mm by a balloon dilating catheter was advanced over the guidewire in the distal ureter dilated. The balloon catheter was deflated and removed taking care leaving the guidewire in position. The short mini ureteroscope was advanced into the left orifice up to the stone. The 365  holmium laser fiber was advanced up the stone and set at power setting of 10 W and frequency of 10 Hz. The stone was fully disintegrated into fragments less than 1 mm in size. The ureteroscope was removed taking care leaving the guidewire in position. The cystoscope was backloaded over the guidewire and a 6 x 24 cm double pigtail stent advanced over the guidewire and positioned into the ureter. The guidewire was then removed taking care to leave the  stent in position. The bladder was drained and the cystoscope was removed. The string was left attached to the stent. Approximately 10 cc of viscous Xylocaine was instilled within the urethra and the bladder. A B&O suppository placed. The procedure was then terminated and patient transferred to recovery in stable condition.

## 2016-07-03 NOTE — Anesthesia Postprocedure Evaluation (Signed)
Anesthesia Post Note  Patient: Evelyn Bentley  Procedure(s) Performed: Procedure(s) (LRB): URETEROSCOPY WITH HOLMIUM LASER LITHOTRIPSY (Left)  Patient location during evaluation: PACU Anesthesia Type: General Level of consciousness: awake and alert Pain management: pain level controlled Vital Signs Assessment: post-procedure vital signs reviewed and stable Respiratory status: spontaneous breathing, nonlabored ventilation, respiratory function stable and patient connected to nasal cannula oxygen Cardiovascular status: blood pressure returned to baseline and stable Postop Assessment: no signs of nausea or vomiting Anesthetic complications: no    Last Vitals:  Vitals:   07/03/16 1823 07/03/16 1840  BP: (!) 111/51 (!) 118/58  Pulse: 87   Resp: 16   Temp: 36.9 C     Last Pain:  Vitals:   07/03/16 1823  TempSrc: Oral  PainSc:                  Donnetta Gillin S

## 2016-07-03 NOTE — Anesthesia Procedure Notes (Signed)
Procedure Name: Intubation Date/Time: 07/03/2016 1:57 PM Performed by: Aline Brochure Pre-anesthesia Checklist: Patient identified, Emergency Drugs available, Suction available and Patient being monitored Patient Re-evaluated:Patient Re-evaluated prior to inductionOxygen Delivery Method: Circle system utilized Preoxygenation: Pre-oxygenation with 100% oxygen Intubation Type: IV induction Ventilation: Mask ventilation without difficulty Laryngoscope Size: McGraph and 3 Grade View: Grade II Tube type: Oral Tube size: 7.0 mm Number of attempts: 2 Airway Equipment and Method: Stylet and Video-laryngoscopy Placement Confirmation: ETT inserted through vocal cords under direct vision,  positive ETCO2 and breath sounds checked- equal and bilateral Secured at: 21 cm Tube secured with: Tape Dental Injury: Teeth and Oropharynx as per pre-operative assessment  Difficulty Due To: Difficulty was anticipated and Difficult Airway- due to anterior larynx Future Recommendations: Recommend- induction with short-acting agent, and alternative techniques readily available

## 2016-07-03 NOTE — Anesthesia Preprocedure Evaluation (Signed)
Anesthesia Evaluation  Patient identified by MRN, date of birth, ID band Patient awake    Reviewed: Allergy & Precautions, NPO status , Patient's Chart, lab work & pertinent test results, reviewed documented beta blocker date and time   Airway Mallampati: II  TM Distance: >3 FB     Dental  (+) Chipped   Pulmonary former smoker,           Cardiovascular hypertension, Pt. on medications      Neuro/Psych PSYCHIATRIC DISORDERS Anxiety Depression  Neuromuscular disease    GI/Hepatic GERD  Controlled,  Endo/Other  Hypothyroidism   Renal/GU Renal InsufficiencyRenal disease     Musculoskeletal  (+) Arthritis , Fibromyalgia -  Abdominal   Peds  Hematology   Anesthesia Other Findings   Reproductive/Obstetrics                             Anesthesia Physical Anesthesia Plan  ASA: III  Anesthesia Plan: General   Post-op Pain Management:    Induction: Intravenous  Airway Management Planned: Oral ETT  Additional Equipment:   Intra-op Plan:   Post-operative Plan:   Informed Consent: I have reviewed the patients History and Physical, chart, labs and discussed the procedure including the risks, benefits and alternatives for the proposed anesthesia with the patient or authorized representative who has indicated his/her understanding and acceptance.     Plan Discussed with: CRNA  Anesthesia Plan Comments:         Anesthesia Quick Evaluation

## 2016-07-03 NOTE — Transfer of Care (Signed)
Immediate Anesthesia Transfer of Care Note  Patient: Evelyn Bentley  Procedure(s) Performed: Procedure(s): URETEROSCOPY WITH HOLMIUM LASER LITHOTRIPSY (Left)  Patient Location: PACU  Anesthesia Type:General  Level of Consciousness: awake  Airway & Oxygen Therapy: Patient Spontanous Breathing and Patient connected to face mask oxygen  Post-op Assessment: Report given to RN and Post -op Vital signs reviewed and stable  Post vital signs: stable  Last Vitals:  Vitals:   07/03/16 1326 07/03/16 1509  BP: (!) 105/53 133/61  Pulse: 77 (!) 102  Resp: 18 19  Temp: 36.7 C     Last Pain:  Vitals:   07/03/16 1326  TempSrc: Tympanic  PainSc: 0-No pain      Patients Stated Pain Goal: 0 ([id number])  Complications: No apparent anesthesia complications

## 2016-07-03 NOTE — Progress Notes (Signed)
Dr Ara Kussmaul notified of low BP. 1L NS Bolus ordered and given. BP slightly improved but MAP still low. MD notified. Orders for 1 more 1L NS bolus now. MD is satisfied if SBP can stay above 90 for now. Will give and continue to monitor.

## 2016-07-03 NOTE — Progress Notes (Signed)
Patient arrived back to unit from PACU. .VSS. Complaining of 4 out of 10 pain. Dr. Yves Dill notified for pain medication order request. Toradol 30 mg IV once ordered.

## 2016-07-04 ENCOUNTER — Encounter: Payer: Self-pay | Admitting: Urology

## 2016-07-04 DIAGNOSIS — N132 Hydronephrosis with renal and ureteral calculous obstruction: Secondary | ICD-10-CM | POA: Diagnosis not present

## 2016-07-04 LAB — BASIC METABOLIC PANEL
ANION GAP: 6 (ref 5–15)
BUN: 12 mg/dL (ref 6–20)
CALCIUM: 8.2 mg/dL — AB (ref 8.9–10.3)
CO2: 22 mmol/L (ref 22–32)
Chloride: 111 mmol/L (ref 101–111)
Creatinine, Ser: 0.91 mg/dL (ref 0.44–1.00)
GFR calc Af Amer: >60 mL/min (ref >60)
Glucose, Bld: 228 mg/dL — ABNORMAL HIGH (ref 65–99)
POTASSIUM: 4.5 mmol/L (ref 3.5–5.1)
SODIUM: 139 mmol/L (ref 135–145)

## 2016-07-04 LAB — CBC
HCT: 29.2 % — ABNORMAL LOW (ref 35.0–47.0)
Hemoglobin: 10.1 g/dL — ABNORMAL LOW (ref 12.0–16.0)
MCH: 30.8 pg (ref 26.0–34.0)
MCHC: 34.6 g/dL (ref 32.0–36.0)
MCV: 89 fL (ref 80.0–100.0)
PLATELETS: 231 10*3/uL (ref 150–440)
RBC: 3.28 MIL/uL — AB (ref 3.80–5.20)
RDW: 13.6 % (ref 11.5–14.5)
WBC: 7.3 10*3/uL (ref 3.6–11.0)

## 2016-07-04 MED ORDER — NITROFURANTOIN MONOHYD MACRO 100 MG PO CAPS: 100.0000 mg | ORAL_CAPSULE | Freq: Two times a day (BID) | ORAL | 0 refills | Status: DC

## 2016-07-04 MED ORDER — NITROFURANTOIN MONOHYD MACRO 100 MG PO CAPS
100.0000 mg | ORAL_CAPSULE | Freq: Two times a day (BID) | ORAL | Status: DC
  Filled 2016-07-04: qty 1

## 2016-07-04 MED ORDER — NITROFURANTOIN MACROCRYSTAL 100 MG PO CAPS: 100.0000 mg | ORAL_CAPSULE | Freq: Two times a day (BID) | ORAL | 0 refills | Status: DC

## 2016-07-04 MED ORDER — ONDANSETRON 8 MG PO TBDP: 8.0000 mg | ORAL_TABLET | Freq: Four times a day (QID) | ORAL | 3 refills | Status: DC | PRN

## 2016-07-04 MED ORDER — HYOSCYAMINE SULFATE 0.125 MG SL SUBL: 0.1250 mg | SUBLINGUAL_TABLET | SUBLINGUAL | 3 refills | Status: DC | PRN

## 2016-07-04 MED ORDER — DOCUSATE SODIUM 100 MG PO CAPS: 200.0000 mg | ORAL_CAPSULE | Freq: Two times a day (BID) | ORAL | 3 refills | Status: DC

## 2016-07-04 MED ORDER — HYOSCYAMINE SULFATE 0.125 MG SL SUBL: 0.2500 mg | SUBLINGUAL_TABLET | SUBLINGUAL | Status: DC | PRN

## 2016-07-04 MED ORDER — HYOSCYAMINE SULFATE 0.125 MG SL SUBL: 0.2500 mg | SUBLINGUAL_TABLET | SUBLINGUAL | 0 refills | Status: DC | PRN

## 2016-07-04 MED ORDER — NITROFURANTOIN MACROCRYSTAL 100 MG PO CAPS: 100.0000 mg | ORAL_CAPSULE | Freq: Two times a day (BID) | ORAL | Status: DC

## 2016-07-04 NOTE — Discharge Summary (Signed)
The patient was admitted with ureteral lithiasis and urinary sepsis. She was started on IV antibiotics and cultures obtained. Hospitalist consultation was performed. She received fluid resuscitation and then was taken to the operating room on August 22 and had ureteroscopic ureterolithotomy with holmium laser lithotripsy and stent placement. She recovered quite well after that and was stable for discharge on August 23.

## 2016-07-04 NOTE — Discharge Instructions (Addendum)
Ureteroscopy, Care After °Refer to this sheet in the next few weeks. These instructions provide you with information on caring for yourself after your procedure. Your health care provider may also give you more specific instructions. Your treatment has been planned according to current medical practices, but problems sometimes occur. Call your health care provider if you have any problems or questions after your procedure.  °WHAT TO EXPECT AFTER THE PROCEDURE  °After your procedure, it is typical to have the following:  °· A burning sensation when you urinate. °· Blood in your urine. °HOME CARE INSTRUCTIONS  °· Only take medicines as directed by your health care provider. Do not take any over-the-counter pain medication unless your health care provider says it is okay. °· Take a warm bath or hold a warm washcloth over your groin to relieve burning. °· Drink enough fluids to keep your urine clear or pale yellow. °¨ Drink two 8-ounce glasses of water every hour for the first 2 hours after you get home. °¨ Continue to drink water often at home. °· You can eat what you usually do. °· Ask your surgeon when you can do your usual activities. °· If you had a tube placed to keep urine flowing (ureteral stent), ask your health care provider when you need to return to have it removed. °· Keep all follow-up appointments. °SEEK MEDICAL CARE IF:  °· You have chills or fever. °· You have burning pain for longer than 24 hours after the procedure. °· You have blood in your urine for longer than 24 hours after the procedure. °SEEK IMMEDIATE MEDICAL CARE IF:  °· You have large amounts of blood or clots in your urine. °· You have very bad pain. °· You have chest pain or trouble breathing. °  °This information is not intended to replace advice given to you by your health care provider. Make sure you discuss any questions you have with your health care provider. °  °Document Released: 11/03/2013 Document Reviewed: 11/03/2013 °Elsevier  Interactive Patient Education ©2016 [employer]. ° °

## 2016-07-04 NOTE — Progress Notes (Signed)
1 Day Post-Op Subjective: Patient reports no pain. Tolerating diet. Once to go home.  Objective: Vital signs in last 24 hours: Temp:  [97.3 F (36.3 C)-99.6 F (37.6 C)] 98.1 F (36.7 C) (08/23 0452) Pulse Rate:  [64-102] 64 (08/23 0452) Resp:  [16-20] 16 (08/23 0452) BP: (105-133)/(47-64) 121/58 (08/23 0452) SpO2:  [95 %-100 %] 98 % (08/23 0452) Weight:  [77.6 kg (171 lb)] 77.6 kg (171 lb) (08/22 1326)  Intake/Output from previous day: 08/22 0701 - 08/23 0700 In: 2967 [P.O.:600; I.V.:2367] Out: 2350 [Urine:2350] Intake/Output this shift: No intake/output data recorded.  Physical Exam:  General: Alert and oriented. GI: Abdomen soft. No CVA tenderness.   Lab Results:  Recent Labs  07/02/16 1910 07/04/16 0532  HGB 10.4* 10.1*  HCT 29.4* 29.2*   BMET  Recent Labs  07/02/16 1910 07/04/16 0532  NA 137 139  K 3.9 4.5  CL 110 111  CO2 22 22  GLUCOSE 121* 228*  BUN 12 12  CREATININE 1.30* 0.91  CALCIUM 7.9* 8.2*    Recent Labs  07/02/16 1910  INR 1.07   No results for input(s): LABURIN in the last 72 hours. Results for orders placed or performed during the hospital encounter of 07/02/16  Culture, blood (x 2)     Status: None (Preliminary result)   Collection Time: 07/02/16  7:49 PM  Result Value Ref Range Status   Specimen Description BLOOD RAC  Final   Special Requests BOTTLES DRAWN AEROBIC AND ANAEROBIC 10ML  Final   Culture NO GROWTH 2 DAYS  Final   Report Status PENDING  Incomplete  Culture, blood (x 2)     Status: None (Preliminary result)   Collection Time: 07/02/16  7:49 PM  Result Value Ref Range Status   Specimen Description BLOOD RIGHT ARM  Final   Special Requests BOTTLES DRAWN AEROBIC AND ANAEROBIC 12ML  Final   Culture NO GROWTH 2 DAYS  Final   Report Status PENDING  Incomplete  Surgical pcr screen     Status: None   Collection Time: 07/03/16  5:09 AM  Result Value Ref Range Status   MRSA, PCR NEGATIVE NEGATIVE Final   Staphylococcus  aureus NEGATIVE NEGATIVE Final    Comment:        The Xpert SA Assay (FDA approved for NASAL specimens in patients over 62 years of age), is one component of a comprehensive surveillance program.  Test performance has been validated by [facility] for patients greater than or equal to 32 year old. It is not intended to diagnose infection nor to guide or monitor treatment.     Studies/Results: Dg Chest 1 View  Result Date: 07/03/2016 CLINICAL DATA:  CHF, history of hypertension and gastroesophageal reflux, discontinued smoking 2 years ago. EXAM: CHEST 1 VIEW COMPARISON:  PA and lateral chest x-ray of December 09, 2008. FINDINGS: The lungs are adequately inflated and clear. The heart and pulmonary vascularity are normal. The mediastinum is normal in width. There is no pleural effusion. The bony thorax exhibits no acute abnormality. IMPRESSION: There is no active cardiopulmonary disease. Electronically Signed   By: David  [name] M.D.   On: 07/03/2016 14:04    Assessment/Plan:   Will discharge the patient home today   LOS: 0 days   Khristy Kalan R 07/04/2016, 7:36 AM

## 2016-07-04 NOTE — Progress Notes (Signed)
[place] [facility] [place] NAME: Evelyn Bentley    MR#:  [id number]  DATE OF BIRTH:  01-09-1956  SUBJECTIVE:   Patient Doing well after surgery is morning. She is ready for discharge. Her blood pressures improved.  REVIEW OF SYSTEMS:    Review of Systems  Constitutional: Negative.  Negative for malaise/fatigue.  HENT: Negative.  Negative for ear discharge, ear pain, hearing loss, nosebleeds and sore throat.   Eyes: Negative.  Negative for blurred vision and pain.  Respiratory: Negative.  Negative for cough, hemoptysis, shortness of breath and wheezing.   Cardiovascular: Negative.  Negative for chest pain, palpitations and leg swelling.  Gastrointestinal: Negative.  Negative for abdominal pain, blood in stool, diarrhea, nausea and vomiting.  Genitourinary: Negative.  Negative for dysuria.  Musculoskeletal: Negative.  Negative for back pain.  Skin: Negative.   Neurological: Negative for dizziness, tremors, speech change, focal weakness, seizures and headaches.  Endo/Heme/Allergies: Negative.  Does not bruise/bleed easily.  Psychiatric/Behavioral: Negative.  Negative for depression, hallucinations and suicidal ideas.    Tolerating Diet: yes     DRUG ALLERGIES:   Allergies  Allergen Reactions  . Codeine Nausea Only    VITALS:  Blood pressure (!) 121/58, pulse 64, temperature 98.1 F (36.7 C), temperature source Oral, resp. rate 16, height 5' (1.524 m), weight 77.6 kg (171 lb), SpO2 98 %.  PHYSICAL EXAMINATION:   Physical Exam  Constitutional: She is oriented to person, place, and time and well-developed, well-nourished, and in no distress. No distress.  HENT:  Head: Normocephalic.  Eyes: No scleral icterus.  Neck: Normal range of motion. Neck supple. No JVD present. No tracheal deviation present.  Cardiovascular: Normal rate, regular rhythm and normal heart sounds.  Exam reveals no gallop and no friction rub.   No murmur  heard. Pulmonary/Chest: Effort normal and breath sounds normal. No respiratory distress. She has no wheezes. She has no rales. She exhibits no tenderness.  Abdominal: Soft. Bowel sounds are normal. She exhibits no distension and no mass. There is no tenderness. There is no rebound and no guarding.  Musculoskeletal: Normal range of motion. She exhibits no edema.  Neurological: She is alert and oriented to person, place, and time.  Skin: Skin is warm. No rash noted. No erythema.  Psychiatric: Affect and judgment normal.      LABORATORY PANEL:   CBC  Recent Labs Lab 07/04/16 0532  WBC 7.3  HGB 10.1*  HCT 29.2*  PLT 231   ------------------------------------------------------------------------------------------------------------------  Chemistries   Recent Labs Lab 07/02/16 1910 07/04/16 0532  NA 137 139  K 3.9 4.5  CL 110 111  CO2 22 22  GLUCOSE 121* 228*  BUN 12 12  CREATININE 1.30* 0.91  CALCIUM 7.9* 8.2*  AST 17  --   ALT 12*  --   ALKPHOS 58  --   BILITOT 0.6  --    ------------------------------------------------------------------------------------------------------------------  Cardiac Enzymes No results for input(s): TROPONINI in the last 168 hours. ------------------------------------------------------------------------------------------------------------------  RADIOLOGY:  Dg Chest 1 View  Result Date: 07/03/2016 CLINICAL DATA:  CHF, history of hypertension and gastroesophageal reflux, discontinued smoking 2 years ago. EXAM: CHEST 1 VIEW COMPARISON:  PA and lateral chest x-ray of December 09, 2008. FINDINGS: The lungs are adequately inflated and clear. The heart and pulmonary vascularity are normal. The mediastinum is normal in width. There is no pleural effusion. The bony thorax exhibits no acute abnormality. IMPRESSION: There is no active cardiopulmonary disease. Electronically Signed  By: David  [name] M.D.   On: 07/03/2016 14:04     ASSESSMENT AND  PLAN:    60 year old female with history of hypothyroidism who is admitted by the urological service for right-sided had nephrosis and a distal 3 mm stone who now has sepsis.  1. Sepsis with hypotension: Patient has tachycardia, fever and hypotension. Sepsis is due to urinary tract infection, Escherichia coli.  Blood pressure has improved. Patient will need to be discharged on oral antibiotics per culture on August 17.  2. Essential hypertension: Blood pressure has normalized and patient may be able to restart her outpatient medications. 3 Hypothyroid: Continue Synthroid.  Plan for primary to discharge patient. Thank you for allowing me to participate in the care of the patient.  Management plans discussed with the patient and she is in agreement.  CODE STATUS: full  TOTAL TIME TAKING CARE OF THIS PATIENT: 25 minutes.     POSSIBLE D/C today, DEPENDING ON CLINICAL CONDITION.   Dalani Mette M.D on 07/04/2016 at 2:06 PM  Between 7am to 6pm - Pager - [phone] After 6pm go to [email or web address] - password [email or web address] [facility]  Office  [phone]  CC: Primary care physician; Perrin Maltese, MD  Note: This dictation was prepared with Dragon dictation along with smaller phrase technology. Any transcriptional errors that result from this process are unintentional.

## 2016-07-04 NOTE — Progress Notes (Signed)
Discharge summary and prescriptions given to patient. Concerns addressed iv site removed

## 2016-07-07 LAB — CULTURE, BLOOD (ROUTINE X 2)
CULTURE: NO GROWTH
Culture: NO GROWTH

## 2016-07-14 ENCOUNTER — Encounter: Payer: Self-pay | Admitting: Emergency Medicine

## 2016-07-14 ENCOUNTER — Emergency Department: Payer: Managed Care, Other (non HMO)

## 2016-07-14 DIAGNOSIS — I10 Essential (primary) hypertension: Secondary | ICD-10-CM | POA: Insufficient documentation

## 2016-07-14 DIAGNOSIS — E039 Hypothyroidism, unspecified: Secondary | ICD-10-CM | POA: Insufficient documentation

## 2016-07-14 DIAGNOSIS — Z87891 Personal history of nicotine dependence: Secondary | ICD-10-CM | POA: Insufficient documentation

## 2016-07-14 DIAGNOSIS — J189 Pneumonia, unspecified organism: Secondary | ICD-10-CM | POA: Insufficient documentation

## 2016-07-14 DIAGNOSIS — Z79899 Other long term (current) drug therapy: Secondary | ICD-10-CM | POA: Diagnosis not present

## 2016-07-14 DIAGNOSIS — R0602 Shortness of breath: Secondary | ICD-10-CM | POA: Diagnosis present

## 2016-07-14 NOTE — ED Triage Notes (Signed)
Patient states that she started feeling short of breath this afternoon. Patient speaking in complete sentences without difficulty at this time.

## 2016-07-15 ENCOUNTER — Emergency Department: Payer: Managed Care, Other (non HMO)

## 2016-07-15 ENCOUNTER — Emergency Department
Admission: EM | Admit: 2016-07-15 | Discharge: 2016-07-15 | Disposition: A | Discharge status: Home / Self Care | Payer: Managed Care, Other (non HMO) | Attending: Emergency Medicine | Admitting: Emergency Medicine

## 2016-07-15 DIAGNOSIS — R0600 Dyspnea, unspecified: Secondary | ICD-10-CM

## 2016-07-15 DIAGNOSIS — J189 Pneumonia, unspecified organism: Secondary | ICD-10-CM

## 2016-07-15 LAB — CBC
HCT: 39.3 % (ref 35.0–47.0)
Hemoglobin: 13.7 g/dL (ref 12.0–16.0)
MCH: 30.4 pg (ref 26.0–34.0)
MCHC: 35 g/dL (ref 32.0–36.0)
MCV: 87 fL (ref 80.0–100.0)
PLATELETS: 372 10*3/uL (ref 150–440)
RBC: 4.52 MIL/uL (ref 3.80–5.20)
RDW: 13.3 % (ref 11.5–14.5)
WBC: 7.7 10*3/uL (ref 3.6–11.0)

## 2016-07-15 LAB — BASIC METABOLIC PANEL
Anion gap: 7 (ref 5–15)
BUN: 14 mg/dL (ref 6–20)
CHLORIDE: 106 mmol/L (ref 101–111)
CO2: 26 mmol/L (ref 22–32)
CREATININE: 1.13 mg/dL — AB (ref 0.44–1.00)
Calcium: 9.2 mg/dL (ref 8.9–10.3)
GFR calc Af Amer: >60 mL/min (ref >60)
GFR calc non Af Amer: 53 mL/min — ABNORMAL LOW (ref >60)
GLUCOSE: 131 mg/dL — AB (ref 65–99)
Potassium: 4.5 mmol/L (ref 3.5–5.1)
Sodium: 139 mmol/L (ref 135–145)

## 2016-07-15 LAB — BLOOD GAS, VENOUS
Acid-Base Excess: 3.2 mmol/L — ABNORMAL HIGH (ref 0.0–2.0)
Bicarbonate: 27.9 mmol/L (ref 20.0–28.0)
O2 SAT: 79.1 %
PATIENT TEMPERATURE: 37
PO2 VEN: 42 mmHg (ref 32.0–45.0)
pCO2, Ven: 42 mmHg — ABNORMAL LOW (ref 44.0–60.0)
pH, Ven: 7.43 (ref 7.250–7.430)

## 2016-07-15 LAB — TROPONIN I: Troponin I: <0.03 ng/mL (ref <0.03)

## 2016-07-15 MED ORDER — IOPAMIDOL (ISOVUE-370) INJECTION 76%
75.0000 mL | Freq: Once | INTRAVENOUS | Status: AC | PRN
  Administered 2016-07-15: 75 mL via INTRAVENOUS

## 2016-07-15 MED ORDER — AZITHROMYCIN 250 MG PO TABS: ORAL_TABLET | ORAL | 0 refills | Status: AC

## 2016-07-15 NOTE — ED Notes (Signed)
Pt back from CT

## 2016-07-15 NOTE — ED Provider Notes (Signed)
[facility] Emergency Department Provider Note   ____________________________________________   First MD Initiated Contact with Patient 07/15/16 0330     (approximate)  I have reviewed the triage vital signs and the nursing notes.   HISTORY  Chief Complaint Shortness of Breath    HPI Evelyn Bentley is a 59 y.o. female who comes into the hospital today with some shortness of breath. The patient reports that she has been trying to recover from kidney stone surgery and septic shock. She reports that today she is been feeling short of breath and having some increasing shortness of breath throughout the day. She is also been having some chills. The surgery was done on August 21 and the patient has been doing better since then. She's had chills since surgery but they have been getting better until today. The patient denies any cough and denies any respiratory history. She's had no fevers, no nausea no vomiting. The patient has no sick contacts. The patient was concerned about the symptoms that she decided to come into the hospital and get checked out today.The patient denies any significant pain at this time.   Past Medical History:  Diagnosis Date  . Anxiety   . Arthritis   . Bursitis    Rt hip  . Complication of anesthesia    nausea  . Depression   . Fibromyalgia   . GERD (gastroesophageal reflux disease)   . Hypercholesteremia   . Hypertension   . Hypothyroidism   . Thyroid disease     Patient Active Problem List   Diagnosis Date Noted  . Ureterolithiasis 07/02/2016  . Renal colic on left side 99991111  . Sepsis due to urinary tract infection ([place]) 07/02/2016  . Hydronephrosis with obstructing calculus 07/02/2016  . History of migraine headaches 05/02/2015  . Anxiety 04/18/2015  . Depression, major, recurrent, moderate ([place]) 04/18/2015  . H/O: hypothyroidism 03/06/2015  . Fibromyalgia 03/06/2015  . H/O arthritis 03/06/2015    Past  Surgical History:  Procedure Laterality Date  . CHOLECYSTECTOMY    . LAPAROSCOPIC HYSTERECTOMY Bilateral 02/28/2016   Procedure: HYSTERECTOMY TOTAL LAPAROSCOPIC / BSO;  Surgeon: Honor Loh Ward, MD;  Location: [facility] ORS;  Service: Gynecology;  Laterality: Bilateral;  . NECK SURGERY    . NISSEN FUNDOPLICATION    . TUBAL LIGATION    . URETEROSCOPY WITH HOLMIUM LASER LITHOTRIPSY Left 07/03/2016   Procedure: URETEROSCOPY WITH HOLMIUM LASER LITHOTRIPSY;  Surgeon: Royston Cowper, MD;  Location: [facility] ORS;  Service: Urology;  Laterality: Left;    Prior to Admission medications   Medication Sig Start Date End Date Taking? Authorizing Provider  ALPRAZolam (XANAX) 0.25 MG tablet Take 1 tablet (0.25 mg total) by mouth at bedtime. 02/14/16   Rainey Pines, MD  ARIPiprazole (ABILIFY) 2 MG tablet Take 1 tablet (2 mg total) by mouth daily. Patient taking differently: Take 2 mg by mouth at bedtime.  02/14/16   Rainey Pines, MD  atorvastatin (LIPITOR) 10 MG tablet Take 20 mg by mouth daily.    Historical Provider, MD  azithromycin (ZITHROMAX Z-PAK) 250 MG tablet Take 2 tablets (500 mg) on  Day 1,  followed by 1 tablet (250 mg) once daily on Days 2 through 5. 07/15/16 07/20/16  Loney Hering, MD  Cholecalciferol (VITAMIN D3) 1000 units CAPS Take 1 capsule by mouth daily.    Historical Provider, MD  docusate sodium (COLACE) 100 MG capsule Take 2 capsules (200 mg total) by mouth 2 (two) times daily. 07/04/16  Royston Cowper, MD  escitalopram (LEXAPRO) 20 MG tablet Take 1 tablet (20 mg total) by mouth every morning. 02/14/16   Rainey Pines, MD  fluticasone (FLONASE) 50 MCG/ACT nasal spray Place into the nose.    Historical Provider, MD  gabapentin (NEURONTIN) 300 MG capsule Take 300 mg by mouth at bedtime.     Historical Provider, MD  hyoscyamine (LEVSIN SL) 0.125 MG SL tablet Place 2 tablets (0.25 mg total) under the tongue every 4 (four) hours as needed (bladder spasms). 07/04/16   Royston Cowper, MD  hyoscyamine  (LEVSIN/SL) 0.125 MG SL tablet Place 1 tablet (0.125 mg total) under the tongue every 4 (four) hours as needed. 1-2 TABS 07/04/16   Royston Cowper, MD  ibuprofen (ADVIL,MOTRIN) 600 MG tablet Take 1 tablet (600 mg total) by mouth every 6 (six) hours. 02/28/16   Chelsea C Ward, MD  ketorolac (TORADOL) 10 MG tablet Take 1 tablet (10 mg total) by mouth every 6 (six) hours as needed. 06/29/16   Harvest Dark, MD  levothyroxine (SYNTHROID, LEVOTHROID) 150 MCG tablet Take 150 mcg by mouth daily before breakfast.    Historical Provider, MD  lisinopril-hydrochlorothiazide (PRINZIDE,ZESTORETIC) 10-12.5 MG tablet Take 1 tablet by mouth daily.  11/10/15   Historical Provider, MD  meloxicam (MOBIC) 15 MG tablet Take by mouth.    Historical Provider, MD  nitrofurantoin (MACRODANTIN) 100 MG capsule Take 1 capsule (100 mg total) by mouth 2 (two) times daily. 07/04/16   Royston Cowper, MD  nitrofurantoin, macrocrystal-monohydrate, (MACROBID) 100 MG capsule Take 1 capsule (100 mg total) by mouth every 12 (twelve) hours. 07/04/16   Royston Cowper, MD  omeprazole (PRILOSEC) 40 MG capsule  01/25/16   Historical Provider, MD  ondansetron (ZOFRAN ODT) 8 MG disintegrating tablet Take 1 tablet (8 mg total) by mouth every 6 (six) hours as needed for nausea or vomiting. 07/04/16   Royston Cowper, MD  oxyCODONE-acetaminophen (PERCOCET) 5-325 MG tablet Take 1-2 tablets by mouth every 4 (four) hours as needed for severe pain. 02/28/16   Chelsea C Ward, MD  pregabalin (LYRICA) 50 MG capsule Take 50 mg by mouth at bedtime.    Historical Provider, MD    Allergies Codeine  Family History  Problem Relation Age of Onset  . Depression Mother   . Anxiety disorder Mother     Social History Social History  Substance Use Topics  . Smoking status: Former Smoker    Start date: 11/14/1974    Quit date: 05/14/2014  . Smokeless tobacco: Never Used  . Alcohol use 0.0 - 0.6 oz/week     Comment: social rare    Review of  Systems Constitutional: chills Eyes: No visual changes. ENT: No sore throat. Cardiovascular: Denies chest pain. Respiratory:  shortness of breath. Gastrointestinal: No abdominal pain.  No nausea, no vomiting.  No diarrhea.  No constipation. Genitourinary: Negative for dysuria. Musculoskeletal: Negative for back pain. Skin: Negative for rash. Neurological: Negative for headaches, focal weakness or numbness.  10-point ROS otherwise negative.  ____________________________________________   PHYSICAL EXAM:  VITAL SIGNS: ED Triage Vitals  Enc Vitals Group     BP 07/14/16 2340 136/75     Pulse Rate 07/14/16 2340 86     Resp 07/14/16 2340 20     Temp 07/14/16 2340 98.1 F (36.7 C)     Temp Source 07/14/16 2340 Oral     SpO2 07/14/16 2340 97 %     Weight 07/14/16 2340 165 lb (74.8 kg)  Height 07/14/16 2340 5' (1.524 m)     Head Circumference --      Peak Flow --      Pain Score 07/15/16 0254 0     Pain Loc --      Pain Edu? --      Excl. in [place]? --     Constitutional: Alert and oriented. Well appearing and in no acute distress. Eyes: Conjunctivae are normal. PERRL. EOMI. Head: Atraumatic. Nose: No congestion/rhinnorhea. Mouth/Throat: Mucous membranes are moist.  Oropharynx non-erythematous. Cardiovascular: Normal rate, regular rhythm. Grossly normal heart sounds.  Good peripheral circulation. Respiratory: Normal respiratory effort.  No retractions. Lungs CTAB. Gastrointestinal: Soft and nontender. No distention. Positive bowel sounds Musculoskeletal: No lower extremity tenderness nor edema.   Neurologic:  Normal speech and language.  Skin:  Skin is warm, dry and intact.  Psychiatric: Mood and affect are normal.   ____________________________________________   LABS (all labs ordered are listed, but only abnormal results are displayed)  Labs Reviewed  BASIC METABOLIC PANEL - Abnormal; Notable for the following:       Result Value   Glucose, Bld 131 (*)     Creatinine, Ser 1.13 (*)    GFR calc non Af Amer 53 (*)    All other components within normal limits  BLOOD GAS, VENOUS - Abnormal; Notable for the following:    pCO2, Ven 42 (*)    Acid-Base Excess 3.2 (*)    All other components within normal limits  CBC  TROPONIN I  TROPONIN I   ____________________________________________  EKG  ED ECG REPORT I, Loney Hering, the attending physician, personally viewed and interpreted this ECG.   Date: 07/14/2016  EKG Time: 2348  Rate: 71  Rhythm: normal sinus rhythm  Axis: normal  Intervals:none  ST&T Change: none  ____________________________________________  RADIOLOGY  Chest x-ray CT angiogram chest ____________________________________________   PROCEDURES  Procedure(s) performed: None  Procedures  Critical Care performed: No  ____________________________________________   INITIAL IMPRESSION / ASSESSMENT AND PLAN / ED COURSE  Pertinent labs & imaging results that were available during my care of the patient were reviewed by me and considered in my medical decision making (see chart for details).  This is a 60 year old female who comes into the hospital today with some chills as well as shortness of breath. The patient reports that she had surgery a few weeks ago and started having some of the symptoms today. The patient's blood work at this time looks unremarkable I will send the patient for a CT angio of her chest as well as repeat the patient's troponin and perform a VBG. I will reassess the patient once I received the results of her blood work.  Clinical Course  Value Comment By Time  DG Chest 2 View No acute cardiopulmonary process seen Loney Hering, MD 09/03 [phone]  CT Angio Chest PE W and/or Wo Contrast 1. No evidence of pulmonary embolus. 2. Minimal hazy opacity centrally within both lungs, which may reflect an atypical infectious or inflammatory process.   Loney Hering, MD 09/03 [phone]   The  patient's CT shows some hazy opacity centrally within the lungs. Given that the patient had surgery there is always the possibility for infection. Also given that the patient had some chills that increases her chance of infection. The patient right now is not feeling short of breath nor is she having any other concerns. She reports that she feels tired. I will give the patient a prescription  for some antibiotics to help treat this pneumonia. If the patient has further symptoms she should return to the emergency Department otherwise she should follow-up with her doctor this week for evaluation of the resolution of her symptoms. The patient will be discharged to home. She has no further questions at this time.  ____________________________________________   FINAL CLINICAL IMPRESSION(S) / ED DIAGNOSES  Final diagnoses:  Dyspnea  CAP (community acquired pneumonia)      NEW MEDICATIONS STARTED DURING THIS VISIT:  Discharge Medication List as of 07/15/2016  6:14 AM    START taking these medications   Details  azithromycin (ZITHROMAX Z-PAK) 250 MG tablet Take 2 tablets (500 mg) on  Day 1,  followed by 1 tablet (250 mg) once daily on Days 2 through 5., Print         Note:  This document was prepared using Dragon voice recognition software and may include unintentional dictation errors.    Loney Hering, MD 07/15/16 [phone]

## 2016-07-15 NOTE — ED Notes (Signed)
Patient transported to CT via stretcher.

## 2016-08-06 ENCOUNTER — Other Ambulatory Visit: Payer: Self-pay | Admitting: Internal Medicine

## 2016-08-06 DIAGNOSIS — Z1231 Encounter for screening mammogram for malignant neoplasm of breast: Secondary | ICD-10-CM

## 2016-08-27 ENCOUNTER — Other Ambulatory Visit: Payer: Self-pay | Admitting: Psychiatry

## 2016-08-30 ENCOUNTER — Ambulatory Visit
Admission: RE | Admit: 2016-08-30 | Discharge: 2016-08-30 | Disposition: A | Discharge status: Home / Self Care | Payer: Managed Care, Other (non HMO) | Source: Ambulatory Visit | Attending: Internal Medicine | Admitting: Internal Medicine

## 2016-08-30 DIAGNOSIS — Z1231 Encounter for screening mammogram for malignant neoplasm of breast: Secondary | ICD-10-CM | POA: Diagnosis not present

## 2016-09-01 ENCOUNTER — Other Ambulatory Visit: Payer: Self-pay | Admitting: Psychiatry

## 2016-09-07 ENCOUNTER — Other Ambulatory Visit: Payer: Self-pay | Admitting: Psychiatry

## 2016-10-30 DIAGNOSIS — K219 Gastro-esophageal reflux disease without esophagitis: Secondary | ICD-10-CM | POA: Insufficient documentation

## 2016-11-13 ENCOUNTER — Encounter
Admission: RE | Disposition: A | Discharge status: Home / Self Care | Payer: Self-pay | Source: Ambulatory Visit | Attending: Gastroenterology

## 2016-11-13 ENCOUNTER — Ambulatory Visit
Admission: RE | Admit: 2016-11-13 | Discharge: 2016-11-13 | Disposition: A | Discharge status: Home / Self Care | Payer: Managed Care, Other (non HMO) | Source: Ambulatory Visit | Attending: Gastroenterology | Admitting: Gastroenterology

## 2016-11-13 ENCOUNTER — Ambulatory Visit: Payer: Managed Care, Other (non HMO) | Admitting: Anesthesiology

## 2016-11-13 ENCOUNTER — Encounter: Payer: Self-pay | Admitting: *Deleted

## 2016-11-13 DIAGNOSIS — K219 Gastro-esophageal reflux disease without esophagitis: Secondary | ICD-10-CM | POA: Diagnosis not present

## 2016-11-13 DIAGNOSIS — E7800 Pure hypercholesterolemia, unspecified: Secondary | ICD-10-CM | POA: Insufficient documentation

## 2016-11-13 DIAGNOSIS — M797 Fibromyalgia: Secondary | ICD-10-CM | POA: Insufficient documentation

## 2016-11-13 DIAGNOSIS — K2210 Ulcer of esophagus without bleeding: Secondary | ICD-10-CM | POA: Insufficient documentation

## 2016-11-13 DIAGNOSIS — Z87891 Personal history of nicotine dependence: Secondary | ICD-10-CM | POA: Diagnosis not present

## 2016-11-13 DIAGNOSIS — K2950 Unspecified chronic gastritis without bleeding: Secondary | ICD-10-CM | POA: Insufficient documentation

## 2016-11-13 DIAGNOSIS — Z1211 Encounter for screening for malignant neoplasm of colon: Secondary | ICD-10-CM | POA: Insufficient documentation

## 2016-11-13 DIAGNOSIS — K259 Gastric ulcer, unspecified as acute or chronic, without hemorrhage or perforation: Secondary | ICD-10-CM | POA: Insufficient documentation

## 2016-11-13 DIAGNOSIS — E039 Hypothyroidism, unspecified: Secondary | ICD-10-CM | POA: Insufficient documentation

## 2016-11-13 DIAGNOSIS — K2980 Duodenitis without bleeding: Secondary | ICD-10-CM | POA: Diagnosis not present

## 2016-11-13 DIAGNOSIS — K228 Other specified diseases of esophagus: Secondary | ICD-10-CM | POA: Diagnosis not present

## 2016-11-13 DIAGNOSIS — Z79899 Other long term (current) drug therapy: Secondary | ICD-10-CM | POA: Insufficient documentation

## 2016-11-13 DIAGNOSIS — R1013 Epigastric pain: Secondary | ICD-10-CM | POA: Insufficient documentation

## 2016-11-13 DIAGNOSIS — K5730 Diverticulosis of large intestine without perforation or abscess without bleeding: Secondary | ICD-10-CM | POA: Diagnosis not present

## 2016-11-13 DIAGNOSIS — Z86010 Personal history of colonic polyps: Secondary | ICD-10-CM | POA: Insufficient documentation

## 2016-11-13 DIAGNOSIS — F418 Other specified anxiety disorders: Secondary | ICD-10-CM | POA: Diagnosis not present

## 2016-11-13 DIAGNOSIS — Z791 Long term (current) use of non-steroidal anti-inflammatories (NSAID): Secondary | ICD-10-CM | POA: Diagnosis not present

## 2016-11-13 DIAGNOSIS — K635 Polyp of colon: Secondary | ICD-10-CM | POA: Insufficient documentation

## 2016-11-13 DIAGNOSIS — D123 Benign neoplasm of transverse colon: Secondary | ICD-10-CM | POA: Diagnosis not present

## 2016-11-13 DIAGNOSIS — I10 Essential (primary) hypertension: Secondary | ICD-10-CM | POA: Diagnosis not present

## 2016-11-13 DIAGNOSIS — K449 Diaphragmatic hernia without obstruction or gangrene: Secondary | ICD-10-CM | POA: Diagnosis not present

## 2016-11-13 HISTORY — PX: ESOPHAGOGASTRODUODENOSCOPY (EGD) WITH PROPOFOL: SHX5813

## 2016-11-13 HISTORY — PX: COLONOSCOPY WITH PROPOFOL: SHX5780

## 2016-11-13 SURGERY — COLONOSCOPY WITH PROPOFOL
Anesthesia: General

## 2016-11-13 MED ORDER — PROPOFOL 500 MG/50ML IV EMUL
INTRAVENOUS | Status: AC
  Filled 2016-11-13: qty 50

## 2016-11-13 MED ORDER — PROPOFOL 500 MG/50ML IV EMUL
INTRAVENOUS | Status: DC | PRN
  Administered 2016-11-13: 150 ug/kg/min via INTRAVENOUS

## 2016-11-13 MED ORDER — PROPOFOL 10 MG/ML IV BOLUS
INTRAVENOUS | Status: DC | PRN
  Administered 2016-11-13: 50 mg via INTRAVENOUS
  Administered 2016-11-13: 20 mg via INTRAVENOUS
  Administered 2016-11-13 (×2): 30 mg via INTRAVENOUS

## 2016-11-13 MED ORDER — PROPOFOL 10 MG/ML IV BOLUS
INTRAVENOUS | Status: AC
  Filled 2016-11-13: qty 20

## 2016-11-13 MED ORDER — SODIUM CHLORIDE 0.9 % IV SOLN
INTRAVENOUS | Status: DC
  Administered 2016-11-13: 16:00:00 via INTRAVENOUS

## 2016-11-13 NOTE — Anesthesia Preprocedure Evaluation (Signed)
Anesthesia Evaluation  Patient identified by MRN, date of birth, ID band Patient awake    Reviewed: Allergy & Precautions, H&P , NPO status , Patient's Chart, lab work & pertinent test results  History of Anesthesia Complications (+) PONV and history of anesthetic complications  Airway Mallampati: III  TM Distance: >3 FB Neck ROM: full    Dental  (+) Poor Dentition, Chipped, Caps   Pulmonary neg shortness of breath, former smoker,    Pulmonary exam normal breath sounds clear to auscultation       Cardiovascular Exercise Tolerance: Good hypertension, (-) angina(-) Past MI and (-) DOE Normal cardiovascular exam Rhythm:regular Rate:Normal     Neuro/Psych PSYCHIATRIC DISORDERS Anxiety Depression negative neurological ROS     GI/Hepatic Neg liver ROS, GERD  Controlled and Medicated,  Endo/Other  Hypothyroidism   Renal/GU Renal disease  negative genitourinary   Musculoskeletal  (+) Arthritis , Fibromyalgia -  Abdominal   Peds  Hematology negative hematology ROS (+)   Anesthesia Other Findings Signs and symptoms suggestive of sleep apnea   Past Medical History: No date: Anxiety No date: Arthritis No date: Bursitis     Comment: Rt hip No date: Complication of anesthesia     Comment: nausea No date: Depression No date: Fibromyalgia No date: GERD (gastroesophageal reflux disease) No date: Hypercholesteremia No date: Hypertension No date: Hypothyroidism No date: Thyroid disease  Past Surgical History: No date: CHOLECYSTECTOMY 02/28/2016: LAPAROSCOPIC HYSTERECTOMY Bilateral     Comment: Procedure: HYSTERECTOMY TOTAL LAPAROSCOPIC /               BSO;  Surgeon: Honor Loh Ward, MD;  Location:               [facility] ORS;  Service: Gynecology;  Laterality:               Bilateral; No date: NECK SURGERY No date: NISSEN FUNDOPLICATION No date: TUBAL LIGATION 07/03/2016: URETEROSCOPY WITH HOLMIUM LASER LITHOTRIPSY Left    Comment: Procedure: URETEROSCOPY WITH HOLMIUM LASER               LITHOTRIPSY;  Surgeon: Royston Cowper, MD;                Location: [facility] ORS;  Service: Urology;                Laterality: Left;  BMI    Body Mass Index:  33.79 kg/m      Reproductive/Obstetrics negative OB ROS                             Anesthesia Physical Anesthesia Plan  ASA: III  Anesthesia Plan: General   Post-op Pain Management:    Induction:   Airway Management Planned:   Additional Equipment:   Intra-op Plan:   Post-operative Plan:   Informed Consent: I have reviewed the patients History and Physical, chart, labs and discussed the procedure including the risks, benefits and alternatives for the proposed anesthesia with the patient or authorized representative who has indicated his/her understanding and acceptance.   Dental Advisory Given  Plan Discussed with: Anesthesiologist, CRNA and Surgeon  Anesthesia Plan Comments:         Anesthesia Quick Evaluation

## 2016-11-13 NOTE — Anesthesia Postprocedure Evaluation (Signed)
Anesthesia Post Note  Patient: Evelyn Bentley  Procedure(s) Performed: Procedure(s) (LRB): COLONOSCOPY WITH PROPOFOL (N/A) ESOPHAGOGASTRODUODENOSCOPY (EGD) WITH PROPOFOL (N/A)  Patient location during evaluation: Endoscopy Anesthesia Type: General Level of consciousness: awake and alert Pain management: pain level controlled Vital Signs Assessment: post-procedure vital signs reviewed and stable Respiratory status: spontaneous breathing, nonlabored ventilation, respiratory function stable and patient connected to nasal cannula oxygen Cardiovascular status: blood pressure returned to baseline and stable Postop Assessment: no signs of nausea or vomiting Anesthetic complications: no     Last Vitals:  Vitals:   11/13/16 1714 11/13/16 1724  BP: (!) 130/51 124/66  Pulse: 71 65  Resp: 14 10  Temp:      Last Pain:  Vitals:   11/13/16 1654  TempSrc: Tympanic                 Efrem Pitstick S

## 2016-11-13 NOTE — Op Note (Signed)
[facility] Patient Name: Evelyn Bentley Procedure Date: 11/13/2016 3:42 PM MRN: [id number] Account #: [id number] Date of Birth: August 18, 1956 Admit Type: Outpatient Age: 61 Room: [name] ENDO ROOM 3 Gender: Female Note Status: Finalized Procedure:            Upper GI endoscopy Indications:          Epigastric abdominal pain, Dyspepsia Providers:            Lollie Sails, MD Referring MD:         Perrin Maltese, MD (Referring MD) Medicines:            Monitored Anesthesia Care Complications:        No immediate complications. Procedure:            Pre-Anesthesia Assessment:                       - ASA Grade Assessment: III - A patient with severe                        systemic disease.                       After obtaining informed consent, the endoscope was                        passed under direct vision. Throughout the procedure,                        the patient's blood pressure, pulse, and oxygen                        saturations were monitored continuously. The Endoscope                        was introduced through the mouth, and advanced to the                        third part of duodenum. The upper GI endoscopy was                        accomplished without difficulty. The patient tolerated                        the procedure well. Findings:      The Z-line was variable.      LA Grade B (one or more mucosal breaks greater than 5 mm, not extending       between the tops of two mucosal folds) esophagitis with no bleeding was       found. Biopsies were taken with a cold forceps for histology.      A small to medium hiatal hernia was found. The Z-line was a variable       distance from incisors; the hiatal hernia was sliding.      Patchy moderate inflammation characterized by congestion (edema),       erosions and granularity was found in the prepyloric region of the       stomach and at the pylorus. Biopsies were taken with a cold  forceps for       histology. Biopsies were taken with a cold forceps for Helicobacter       pylori  testing.      One non-bleeding superficial gastric ulcer was found at the pylorus. The       lesion was 3 mm in largest dimension.      Patchy mild inflammation characterized by congestion (edema), erosions       and erythema was found in the duodenal bulb and in the second portion of       the duodenum.      The cardia and gastric fundus were normal on retroflexion otherwise.      Evidence of a prior Nissen fundoplication was found in the gastric       fundus. This was characterized by healthy appearing mucosa and a       non-intact appearance. Impression:           - Z-line variable.                       - LA Grade B erosive esophagitis. Biopsied.                       - Small hiatal hernia.                       - Erosive gastritis. Biopsied.                       - Non-bleeding gastric ulcer.                       - Erosive duodenitis. Recommendation:       - Await pathology results.                       - Use Protonix (pantoprazole) 40 mg PO BID for 4 weeks.                       - Use sucralfate tablets 1 gram PO QID daily.                       - Use Protonix (pantoprazole) 40 mg PO daily daily.                       - Perform a colonoscopy today. Procedure Code(s):    --- Professional ---                       [phone], Esophagogastroduodenoscopy, flexible, transoral;                        with biopsy, single or multiple Diagnosis Code(s):    --- Professional ---                       K22.8, Other specified diseases of esophagus                       K20.8, Other esophagitis                       K44.9, Diaphragmatic hernia without obstruction or                        gangrene  K29.60, Other gastritis without bleeding                       K25.9, Gastric ulcer, unspecified as acute or chronic,                        without hemorrhage or perforation                        K29.80, Duodenitis without bleeding                       R10.13, Epigastric pain CPT copyright 2016 American Medical Association. All rights reserved. The codes documented in this report are preliminary and upon coder review may  be revised to meet current compliance requirements. Lollie Sails, MD 11/13/2016 4:08:54 PM This report has been signed electronically. Number of Addenda: 0 Note Initiated On: 11/13/2016 3:42 PM      [facility]

## 2016-11-13 NOTE — Op Note (Signed)
[facility] Patient Name: Evelyn Bentley Procedure Date: 11/13/2016 3:41 PM MRN: [id number] Account #: [id number] Date of Birth: June 05, 1956 Admit Type: Outpatient Age: 61 Room: [facility] ENDO ROOM 3 Gender: Female Note Status: Finalized Procedure:            Colonoscopy Indications:          Personal history of colonic polyps Providers:            Lollie Sails, MD Referring MD:         Perrin Maltese, MD (Referring MD) Medicines:            Monitored Anesthesia Care Complications:        No immediate complications. Procedure:            Pre-Anesthesia Assessment:                       - ASA Grade Assessment: III - A patient with severe                        systemic disease.                       After obtaining informed consent, the colonoscope was                        passed under direct vision. Throughout the procedure,                        the patient's blood pressure, pulse, and oxygen                        saturations were monitored continuously. The                        Colonoscope was introduced through the anus and                        advanced to the the terminal ileum. The patient                        tolerated the procedure well. The quality of the bowel                        preparation was fair. Findings:      A 2 mm polyp was found in the cecum. The polyp was sessile. The polyp       was removed with a cold biopsy forceps. Resection and retrieval were       complete.      A 4 mm polyp was found in the distal transverse colon. The polyp was       sessile. The polyp was removed with a cold biopsy forceps. Resection and       retrieval were complete.      Multiple medium-mouthed diverticula were found in the sigmoid colon and       descending colon.      The digital rectal exam was normal. Impression:           - Preparation of the colon was fair.                       - One 2 mm  polyp in the cecum, removed with a cold                         biopsy forceps. Resected and retrieved.                       - One 4 mm polyp in the distal transverse colon,                        removed with a cold biopsy forceps. Resected and                        retrieved.                       - Diverticulosis in the sigmoid colon and in the                        descending colon. Recommendation:       - Discharge patient to home.                       - Telephone GI clinic for pathology results in 1 week. Procedure Code(s):    --- Professional ---                       [phone], Colonoscopy, flexible; with biopsy, single or                        multiple Diagnosis Code(s):    --- Professional ---                       D12.0, Benign neoplasm of cecum                       D12.3, Benign neoplasm of transverse colon (hepatic                        flexure or splenic flexure)                       Z86.010, Personal history of colonic polyps                       K57.30, Diverticulosis of large intestine without                        perforation or abscess without bleeding CPT copyright 2016 American Medical Association. All rights reserved. The codes documented in this report are preliminary and upon coder review may  be revised to meet current compliance requirements. Lollie Sails, MD 11/13/2016 4:50:12 PM This report has been signed electronically. Number of Addenda: 0 Note Initiated On: 11/13/2016 3:41 PM Scope Withdrawal Time: 0 hours 15 minutes 0 seconds  Total Procedure Duration: 0 hours 33 minutes 31 seconds       [facility]

## 2016-11-13 NOTE — H&P (Signed)
Outpatient short stay form Pre-procedure 11/13/2016 2:28 PM Lollie Sails MD  Primary Physician: Dr Lamonte Sakai  Reason for visit:  EGD and colonoscopy  History of present illness:  Patient is a 61 year old female presenting today as above. She has personal history of adenomatous colon polyps. However she is also been having some issues with epigastric bloating and discomfort/dyspepsia. She has been taking meloxicam for about 2 years. She was taking A single dose of omeprazole previously but recently has been increased to a twice a day dosing of pantoprazole as well as addition of Carafate. This has improved her symptoms. She did have a fundoplication about 10 years ago. She is also been taking meloxicam 15 mg daily. She tolerated her prep well. She takes no prescribed blood thinning agents.    Current Facility-Administered Medications:  .  0.9 %  sodium chloride infusion, , Intravenous, Continuous, Lollie Sails, MD  Prescriptions Prior to Admission  Medication Sig Dispense Refill Last Dose  . ALPRAZolam (XANAX) 0.25 MG tablet Take 1 tablet (0.25 mg total) by mouth at bedtime. 30 tablet 1 11/12/2016 at Unknown time  . ARIPiprazole (ABILIFY) 2 MG tablet Take 1 tablet (2 mg total) by mouth daily. (Patient taking differently: Take 2 mg by mouth at bedtime. ) 30 tablet 1 11/12/2016 at Unknown time  . atorvastatin (LIPITOR) 10 MG tablet Take 20 mg by mouth daily.   11/12/2016 at Unknown time  . Cholecalciferol (VITAMIN D3) 1000 units CAPS Take 1 capsule by mouth daily.   Past Week at Unknown time  . docusate sodium (COLACE) 100 MG capsule Take 2 capsules (200 mg total) by mouth 2 (two) times daily. 120 capsule 3 Past Week at Unknown time  . escitalopram (LEXAPRO) 20 MG tablet Take 1 tablet (20 mg total) by mouth every morning. 30 tablet 1 11/12/2016 at Unknown time  . fluticasone (FLONASE) 50 MCG/ACT nasal spray Place into the nose.   11/12/2016 at Unknown time  . gabapentin (NEURONTIN) 300 MG  capsule Take 300 mg by mouth at bedtime.    11/12/2016 at Unknown time  . hyoscyamine (LEVSIN SL) 0.125 MG SL tablet Place 2 tablets (0.25 mg total) under the tongue every 4 (four) hours as needed (bladder spasms). 30 tablet 0 11/12/2016 at Unknown time  . ibuprofen (ADVIL,MOTRIN) 600 MG tablet Take 1 tablet (600 mg total) by mouth every 6 (six) hours. 65 tablet 0 Past Week at Unknown time  . levothyroxine (SYNTHROID, LEVOTHROID) 150 MCG tablet Take 150 mcg by mouth daily before breakfast.   11/12/2016 at Unknown time  . lisinopril-hydrochlorothiazide (PRINZIDE,ZESTORETIC) 10-12.5 MG tablet Take 1 tablet by mouth daily.   2 11/12/2016 at Unknown time  . meloxicam (MOBIC) 15 MG tablet Take by mouth.   11/12/2016 at Unknown time  . omeprazole (PRILOSEC) 40 MG capsule   2 11/12/2016 at Unknown time  . ondansetron (ZOFRAN ODT) 8 MG disintegrating tablet Take 1 tablet (8 mg total) by mouth every 6 (six) hours as needed for nausea or vomiting. 10 tablet 3 Past Week at Unknown time  . oxyCODONE-acetaminophen (PERCOCET) 5-325 MG tablet Take 1-2 tablets by mouth every 4 (four) hours as needed for severe pain. 31 tablet 0 Past Week at Unknown time  . pregabalin (LYRICA) 50 MG capsule Take 50 mg by mouth at bedtime.   11/12/2016 at Unknown time  . hyoscyamine (LEVSIN/SL) 0.125 MG SL tablet Place 1 tablet (0.125 mg total) under the tongue every 4 (four) hours as needed. 1-2 TABS 40  tablet 3   . ketorolac (TORADOL) 10 MG tablet Take 1 tablet (10 mg total) by mouth every 6 (six) hours as needed. (Patient not taking: Reported on 11/13/2016) 20 tablet 0 Not Taking at Unknown time  . nitrofurantoin (MACRODANTIN) 100 MG capsule Take 1 capsule (100 mg total) by mouth 2 (two) times daily. (Patient not taking: Reported on 11/13/2016) 14 capsule 0 Completed Course at Unknown time  . nitrofurantoin, macrocrystal-monohydrate, (MACROBID) 100 MG capsule Take 1 capsule (100 mg total) by mouth every 12 (twelve) hours. (Patient not taking: Reported  on 11/13/2016) 14 capsule 0 Completed Course at Unknown time     Allergies  Allergen Reactions  . Codeine Nausea Only     Past Medical History:  Diagnosis Date  . Anxiety   . Arthritis   . Bursitis    Rt hip  . Complication of anesthesia    nausea  . Depression   . Fibromyalgia   . GERD (gastroesophageal reflux disease)   . Hypercholesteremia   . Hypertension   . Hypothyroidism   . Thyroid disease     Review of systems:      Physical Exam    Heart and lungs: Regular rate and rhythm without rub or gallop, lungs are bilaterally clear.    HEENT: Normocephalic atraumatic eyes are anicteric    Other:     Pertinant exam for procedure: Soft nontender nondistended bowel sounds positive normoactive.    Planned proceedures: EGD, colonoscopy and indicated procedures. I have discussed the risks benefits and complications of procedures to include not limited to bleeding, infection, perforation and the risk of sedation and the patient wishes to proceed.    Lollie Sails, MD Gastroenterology 11/13/2016  2:28 PM

## 2016-11-13 NOTE — Transfer of Care (Signed)
Immediate Anesthesia Transfer of Care Note  Patient: Evelyn Bentley  Procedure(s) Performed: Procedure(s): COLONOSCOPY WITH PROPOFOL (N/A) ESOPHAGOGASTRODUODENOSCOPY (EGD) WITH PROPOFOL (N/A)  Patient Location: PACU  Anesthesia Type:General  Level of Consciousness: sedated  Airway & Oxygen Therapy: Patient Spontanous Breathing and Patient connected to nasal cannula oxygen  Post-op Assessment: Report given to RN and Post -op Vital signs reviewed and stable  Post vital signs: Reviewed and stable  Last Vitals:  Vitals:   11/13/16 1308  BP: (!) 114/42  Pulse: 76  Resp: 16  Temp: 37 C    Last Pain:  Vitals:   11/13/16 1308  TempSrc: Tympanic         Complications: No apparent anesthesia complications

## 2016-11-14 ENCOUNTER — Encounter: Payer: Self-pay | Admitting: Gastroenterology

## 2016-11-15 ENCOUNTER — Encounter: Payer: Self-pay | Admitting: Psychiatry

## 2016-11-15 ENCOUNTER — Ambulatory Visit (INDEPENDENT_AMBULATORY_CARE_PROVIDER_SITE_OTHER): Payer: Managed Care, Other (non HMO) | Admitting: Psychiatry

## 2016-11-15 VITALS — BP 116/77 | HR 93 | Temp 97.7°F | Wt 175.8 lb

## 2016-11-15 DIAGNOSIS — F331 Major depressive disorder, recurrent, moderate: Secondary | ICD-10-CM

## 2016-11-15 LAB — SURGICAL PATHOLOGY

## 2016-11-15 MED ORDER — TEMAZEPAM 7.5 MG PO CAPS: 7.5000 mg | ORAL_CAPSULE | Freq: Every evening | ORAL | 1 refills | Status: DC | PRN

## 2016-11-15 MED ORDER — ARIPIPRAZOLE 5 MG PO TABS: 5.0000 mg | ORAL_TABLET | Freq: Every day | ORAL | 1 refills | Status: DC

## 2016-11-15 MED ORDER — ESCITALOPRAM OXALATE 20 MG PO TABS: 20.0000 mg | ORAL_TABLET | ORAL | 1 refills | Status: DC

## 2016-11-15 NOTE — Progress Notes (Signed)
BH MD/PA/NP OP Progress Note  11/15/2016 3:24 PM Evelyn Bentley  MRN:  [id number]  Subjective:    Patient is a 61 year old widowed female who presented for the follow-up appointment. She reported that she was sick for most of the last year as she had the hysterectomy done and after that she had series of urinary tract infection. Patient reported that she was also admitted to the hospital due to septic shock. Patient reported that she recovered from pneumonia and was admitted to the hospital. She reported that she has been taking her medications as prescribed but continues to feel depressed and has not been sleeping well. She has been taking her medications as prescribed. Her primary care physician Dr. Humphrey Rolls has been giving her medications to help her sleep at night and she tried Ambien but it was not helpful. Patient reported that she is also having issues at her job at this time. She reported that she wants something to help her sleep as she was feeling very tired at night but is not able to sleep well. She reported that she is struggling due to her depression at this time. She appeared well groomed as usual. We discussed about her medications in detail. She currently denied having any suicidal homicidal ideations or plans. She denied having any perceptual disturbances.  She has been following with her PCP Dr. Humphrey Rolls on a regular basis. She reported that she takes Lyrica due to chronic pain and is planning to have the dose increased as the pain is not controlled at this time.    Chief Complaint:  Chief Complaint    Follow-up; Medication Refill     Visit Diagnosis:     ICD-9-CM ICD-10-CM   1. MDD (major depressive disorder), recurrent episode, moderate (HCC) 296.32 F33.1     Past Medical History:  Past Medical History:  Diagnosis Date  . Anxiety   . Arthritis   . Bursitis    Rt hip  . Complication of anesthesia    nausea  . Depression   . Fibromyalgia   . GERD (gastroesophageal  reflux disease)   . Hypercholesteremia   . Hypertension   . Hypothyroidism   . Thyroid disease     Past Surgical History:  Procedure Laterality Date  . ABDOMINAL HYSTERECTOMY    . CHOLECYSTECTOMY    . COLONOSCOPY WITH PROPOFOL N/A 11/13/2016   Procedure: COLONOSCOPY WITH PROPOFOL;  Surgeon: Lollie Sails, MD;  Location: [facility] ENDOSCOPY;  Service: Endoscopy;  Laterality: N/A;  . ESOPHAGOGASTRODUODENOSCOPY (EGD) WITH PROPOFOL N/A 11/13/2016   Procedure: ESOPHAGOGASTRODUODENOSCOPY (EGD) WITH PROPOFOL;  Surgeon: Lollie Sails, MD;  Location: [facility] ENDOSCOPY;  Service: Endoscopy;  Laterality: N/A;  . LAPAROSCOPIC HYSTERECTOMY Bilateral 02/28/2016   Procedure: HYSTERECTOMY TOTAL LAPAROSCOPIC / BSO;  Surgeon: Honor Loh Ward, MD;  Location: [facility] ORS;  Service: Gynecology;  Laterality: Bilateral;  . NECK SURGERY    . NISSEN FUNDOPLICATION    . TUBAL LIGATION    . URETEROSCOPY WITH HOLMIUM LASER LITHOTRIPSY Left 07/03/2016   Procedure: URETEROSCOPY WITH HOLMIUM LASER LITHOTRIPSY;  Surgeon: Royston Cowper, MD;  Location: [facility] ORS;  Service: Urology;  Laterality: Left;   Family History:  Family History  Problem Relation Age of Onset  . Depression Mother   . Anxiety disorder Mother   . Breast cancer Neg Hx    Social History:  Social History   Social History  . Marital status: Widowed    Spouse name: N/A  . Number of children: N/A  .  Years of education: N/A   Social History Main Topics  . Smoking status: Former Smoker    Start date: 11/14/1974    Quit date: 05/14/2014  . Smokeless tobacco: Never Used  . Alcohol use 0.0 - 0.6 oz/week     Comment: social rare  . Drug use: No  . Sexual activity: Not Currently   Other Topics Concern  . None   Social History Narrative  . None   Additional History:   Patient is currently widowed and is living with her family members.  Assessment:   Musculoskeletal: Strength & Muscle Tone: within normal limits Gait & Station: normal Patient  leans: N/A  Psychiatric Specialty Exam: HPI  ROS  Blood pressure 116/77, pulse 93, temperature 97.7 F (36.5 C), temperature source Oral, weight 175 lb 12.8 oz (79.7 kg).Body mass index is 34.33 kg/m.  General Appearance: Casual  Eye Contact:  Fair  Speech:  Clear and Coherent and Normal Rate  Volume:  Normal  Mood:  Anxious and Depressed  Affect:  Appropriate  Thought Process:  Coherent and Goal Directed  Orientation:  Full (Time, Place, and Person)  Thought Content:  WDL  Suicidal Thoughts:  No  Homicidal Thoughts:  No  Memory:  Immediate;   Fair  Judgement:  Fair  Insight:  Fair  Psychomotor Activity:  Normal  Concentration:  Fair  Recall:  [employer] of Knowledge: Fair  Language: Fair  Akathisia:  No  Handed:  Right  AIMS (if indicated):    Assets:  Communication Skills Desire for Improvement Physical Health Social Support  ADL's:  Intact  Cognition: WNL  Sleep:     Is the patient at risk to self?  No. Has the patient been a risk to self in the past 6 months?  No. Has the patient been a risk to self within the distant past?  No. Is the patient a risk to others?  No. Has the patient been a risk to others in the past 6 months?  No. Has the patient been a risk to others within the distant past?  No.  Current Medications: Current Outpatient Prescriptions  Medication Sig Dispense Refill  . ARIPiprazole (ABILIFY) 5 MG tablet Take 1 tablet (5 mg total) by mouth daily. 30 tablet 1  . atorvastatin (LIPITOR) 10 MG tablet Take 20 mg by mouth daily.    . Cholecalciferol (VITAMIN D3) 1000 units CAPS Take 1 capsule by mouth daily.    Marland Kitchen docusate sodium (COLACE) 100 MG capsule Take 2 capsules (200 mg total) by mouth 2 (two) times daily. 120 capsule 3  . escitalopram (LEXAPRO) 20 MG tablet Take 1 tablet (20 mg total) by mouth every morning. 30 tablet 1  . fluticasone (FLONASE) 50 MCG/ACT nasal spray Place into the nose.    . hyoscyamine (LEVSIN SL) 0.125 MG SL tablet Place 2  tablets (0.25 mg total) under the tongue every 4 (four) hours as needed (bladder spasms). 30 tablet 0  . hyoscyamine (LEVSIN/SL) 0.125 MG SL tablet Place 1 tablet (0.125 mg total) under the tongue every 4 (four) hours as needed. 1-2 TABS 40 tablet 3  . ibuprofen (ADVIL,MOTRIN) 600 MG tablet Take 1 tablet (600 mg total) by mouth every 6 (six) hours. 65 tablet 0  . levothyroxine (SYNTHROID, LEVOTHROID) 137 MCG tablet Take by mouth.    . levothyroxine (SYNTHROID, LEVOTHROID) 150 MCG tablet Take 150 mcg by mouth daily before breakfast.    . lisinopril-hydrochlorothiazide (PRINZIDE,ZESTORETIC) 10-12.5 MG tablet Take 1 tablet by  mouth daily.   2  . meloxicam (MOBIC) 15 MG tablet Take by mouth.    . meloxicam (MOBIC) 15 MG tablet     . nitrofurantoin (MACRODANTIN) 100 MG capsule Take 1 capsule (100 mg total) by mouth 2 (two) times daily. 14 capsule 0  . pantoprazole (PROTONIX) 40 MG tablet Take by mouth.    . pregabalin (LYRICA) 50 MG capsule Take 50 mg by mouth at bedtime.    . sucralfate (CARAFATE) 1 g tablet     . temazepam (RESTORIL) 7.5 MG capsule Take 1 capsule (7.5 mg total) by mouth at bedtime as needed for sleep. 30 capsule 1   No current facility-administered medications for this visit.     Medical Decision Making:  Review of Psycho-Social Stressors (1)  Treatment Plan Summary:Medication management  She will continue on Lexapro 20 mg daily She will continue on Abilify 5  mg And advised patient to start taking half a pill for one week and then titrate the dose to 1 pill daily. Also started her on temazepam 7.5 mg by mouth daily at bedtime for insomnia. Advised her to start taking 2 pills if she is not sleeping and she agreed with the plan. She will follow-up in one month or earlier depending on her symptoms.   Discussed with her about the adverse effects of the medication in detail and she demonstrated understanding   More than 50% of the time spent in psychoeducation, counseling and  coordination of care.    This note was generated in part or whole with voice recognition software. Voice regonition is usually quite accurate but there are transcription errors that can and very often do occur. I apologize for any typographical errors that were not detected and corrected.     Rainey Pines, MD    11/15/2016, 3:24 PM

## 2016-12-06 ENCOUNTER — Telehealth: Payer: Self-pay

## 2016-12-06 NOTE — Telephone Encounter (Signed)
pt called left message that you told her that she can take two of the restoril 7.5mg  but you did not give her enough medcation .  pt states she been taking two and she out.  need rx to reflect what was told

## 2016-12-07 NOTE — Telephone Encounter (Signed)
pt called left another message that she needs refill on restoril.

## 2016-12-10 NOTE — Telephone Encounter (Signed)
Call in refill of Restoril 15mg  po qhs #30 x 1 month supply .

## 2016-12-17 ENCOUNTER — Encounter: Payer: Self-pay | Admitting: Psychiatry

## 2016-12-17 ENCOUNTER — Ambulatory Visit (INDEPENDENT_AMBULATORY_CARE_PROVIDER_SITE_OTHER): Payer: Managed Care, Other (non HMO) | Admitting: Psychiatry

## 2016-12-17 VITALS — BP 98/63 | HR 72 | Temp 97.3°F | Wt 179.8 lb

## 2016-12-17 DIAGNOSIS — F331 Major depressive disorder, recurrent, moderate: Secondary | ICD-10-CM | POA: Diagnosis not present

## 2016-12-17 DIAGNOSIS — M797 Fibromyalgia: Secondary | ICD-10-CM

## 2016-12-17 MED ORDER — TEMAZEPAM 15 MG PO CAPS: 15.0000 mg | ORAL_CAPSULE | Freq: Every evening | ORAL | 1 refills | Status: DC | PRN

## 2016-12-17 MED ORDER — ESCITALOPRAM OXALATE 20 MG PO TABS: 20.0000 mg | ORAL_TABLET | ORAL | 1 refills | Status: DC

## 2016-12-17 MED ORDER — ARIPIPRAZOLE 5 MG PO TABS: 5.0000 mg | ORAL_TABLET | Freq: Every day | ORAL | 1 refills | Status: DC

## 2016-12-17 NOTE — Progress Notes (Signed)
BH MD/PA/NP OP Progress Note  12/17/2016 3:04 PM Evelyn Bentley  MRN:  [id number]  Subjective:    Patient is a 61 year old widowed female who presented for the follow-up appointment. She reported that she has been working long hours as most of her coworkers are sick due to flu. She reported that she has recently seen her primary care physician Dr. Humphrey Rolls who has increasing the dose of her Lyrica.  She is feeling that her head is floating and she cannot concentrate. She reported that she was feeling tired and was having muscle pain due to fibromyalgia. Patient reported that Lyrica  has been helping her. She reported that she has been compliant with her medications. Her Synthroid dose has also been adjusted recently. Patient appeared calm and alert during the interview. She currently denied having any suicidal homicidal ideations or plans.  We discussed about her medications in detail and she is receptive to have her medications adjusted at this time. She is sleeping well with the help of Restoril 15 mg at bedtime.     Chief Complaint:  Chief Complaint    Follow-up; Medication Refill     Visit Diagnosis:     ICD-9-CM ICD-10-CM   1. MDD (major depressive disorder), recurrent episode, moderate (HCC) 296.32 F33.1     Past Medical History:  Past Medical History:  Diagnosis Date  . Anxiety   . Arthritis   . Bursitis    Rt hip  . Complication of anesthesia    nausea  . Depression   . Fibromyalgia   . GERD (gastroesophageal reflux disease)   . Hypercholesteremia   . Hypertension   . Hypothyroidism   . Thyroid disease     Past Surgical History:  Procedure Laterality Date  . ABDOMINAL HYSTERECTOMY    . CHOLECYSTECTOMY    . COLONOSCOPY WITH PROPOFOL N/A 11/13/2016   Procedure: COLONOSCOPY WITH PROPOFOL;  Surgeon: Lollie Sails, MD;  Location: [facility] ENDOSCOPY;  Service: Endoscopy;  Laterality: N/A;  . ESOPHAGOGASTRODUODENOSCOPY (EGD) WITH PROPOFOL N/A 11/13/2016   Procedure:  ESOPHAGOGASTRODUODENOSCOPY (EGD) WITH PROPOFOL;  Surgeon: Lollie Sails, MD;  Location: [facility] ENDOSCOPY;  Service: Endoscopy;  Laterality: N/A;  . LAPAROSCOPIC HYSTERECTOMY Bilateral 02/28/2016   Procedure: HYSTERECTOMY TOTAL LAPAROSCOPIC / BSO;  Surgeon: Honor Loh Ward, MD;  Location: [facility] ORS;  Service: Gynecology;  Laterality: Bilateral;  . NECK SURGERY    . NISSEN FUNDOPLICATION    . TUBAL LIGATION    . URETEROSCOPY WITH HOLMIUM LASER LITHOTRIPSY Left 07/03/2016   Procedure: URETEROSCOPY WITH HOLMIUM LASER LITHOTRIPSY;  Surgeon: Royston Cowper, MD;  Location: [facility] ORS;  Service: Urology;  Laterality: Left;   Family History:  Family History  Problem Relation Age of Onset  . Depression Mother   . Anxiety disorder Mother   . Breast cancer Neg Hx    Social History:  Social History   Social History  . Marital status: Widowed    Spouse name: N/A  . Number of children: N/A  . Years of education: N/A   Social History Main Topics  . Smoking status: Former Smoker    Start date: 11/14/1974    Quit date: 05/14/2014  . Smokeless tobacco: Never Used  . Alcohol use 0.0 - 0.6 oz/week     Comment: social rare  . Drug use: No  . Sexual activity: Not Currently   Other Topics Concern  . None   Social History Narrative  . None   Additional History:   Patient is currently widowed  and is living with her family members.  Assessment:   Musculoskeletal: Strength & Muscle Tone: within normal limits Gait & Station: normal Patient leans: N/A  Psychiatric Specialty Exam: Medication Refill     ROS  Blood pressure 98/63, pulse 72, temperature 97.3 F (36.3 C), temperature source Oral, weight 179 lb 12.8 oz (81.6 kg).Body mass index is 35.11 kg/m.  General Appearance: Casual  Eye Contact:  Fair  Speech:  Clear and Coherent and Normal Rate  Volume:  Normal  Mood:  Anxious and Depressed  Affect:  Appropriate  Thought Process:  Coherent and Goal Directed  Orientation:  Full (Time,  Place, and Person)  Thought Content:  WDL  Suicidal Thoughts:  No  Homicidal Thoughts:  No  Memory:  Immediate;   Fair  Judgement:  Fair  Insight:  Fair  Psychomotor Activity:  Normal  Concentration:  Fair  Recall:  [employer] of Knowledge: Fair  Language: Fair  Akathisia:  No  Handed:  Right  AIMS (if indicated):    Assets:  Communication Skills Desire for Improvement Physical Health Social Support  ADL's:  Intact  Cognition: WNL  Sleep:     Is the patient at risk to self?  No. Has the patient been a risk to self in the past 6 months?  No. Has the patient been a risk to self within the distant past?  No. Is the patient a risk to others?  No. Has the patient been a risk to others in the past 6 months?  No. Has the patient been a risk to others within the distant past?  No.  Current Medications: Current Outpatient Prescriptions  Medication Sig Dispense Refill  . ARIPiprazole (ABILIFY) 5 MG tablet Take 1 tablet (5 mg total) by mouth daily. 30 tablet 1  . atorvastatin (LIPITOR) 10 MG tablet Take 20 mg by mouth daily.    . baclofen (LIORESAL) 10 MG tablet     . celecoxib (CELEBREX) 200 MG capsule     . Cholecalciferol (VITAMIN D3) 1000 units CAPS Take 1 capsule by mouth daily.    Marland Kitchen docusate sodium (COLACE) 100 MG capsule Take 2 capsules (200 mg total) by mouth 2 (two) times daily. 120 capsule 3  . escitalopram (LEXAPRO) 20 MG tablet Take 1 tablet (20 mg total) by mouth every morning. 30 tablet 1  . estradiol (CLIMARA - DOSED IN MG/24 HR) 0.0375 mg/24hr patch     . fluticasone (FLONASE) 50 MCG/ACT nasal spray Place into the nose.    . hyoscyamine (LEVSIN SL) 0.125 MG SL tablet Place 2 tablets (0.25 mg total) under the tongue every 4 (four) hours as needed (bladder spasms). 30 tablet 0  . hyoscyamine (LEVSIN/SL) 0.125 MG SL tablet Place 1 tablet (0.125 mg total) under the tongue every 4 (four) hours as needed. 1-2 TABS 40 tablet 3  . ibuprofen (ADVIL,MOTRIN) 600 MG tablet Take 1  tablet (600 mg total) by mouth every 6 (six) hours. 65 tablet 0  . levothyroxine (SYNTHROID, LEVOTHROID) 125 MCG tablet     . lisinopril-hydrochlorothiazide (PRINZIDE,ZESTORETIC) 10-12.5 MG tablet Take 1 tablet by mouth daily.   2  . LYRICA 75 MG capsule     . meloxicam (MOBIC) 15 MG tablet Take by mouth.    . meloxicam (MOBIC) 15 MG tablet     . nitrofurantoin (MACRODANTIN) 100 MG capsule Take 1 capsule (100 mg total) by mouth 2 (two) times daily. 14 capsule 0  . pantoprazole (PROTONIX) 40 MG tablet Take by  mouth.    . pregabalin (LYRICA) 50 MG capsule Take 50 mg by mouth at bedtime.    . sucralfate (CARAFATE) 1 g tablet     . temazepam (RESTORIL) 7.5 MG capsule Take 1 capsule (7.5 mg total) by mouth at bedtime as needed for sleep. 30 capsule 1   No current facility-administered medications for this visit.     Medical Decision Making:  Review of Psycho-Social Stressors (1)  Treatment Plan Summary:Medication management  She will continue on Lexapro 20 mg daily She will continue on Abilify 5  mg Daily. Also started her on temazepam 15 mg by mouth daily at bedtime for insomnia.  Patient will initiate dose of Lyrica 150 mg at bedtime as she is feeling very tired and sleepy during the daytime.  She will follow-up in one month or earlier depending on her symptoms.   Discussed with her about the adverse effects of the medication in detail and she demonstrated understanding   More than 50% of the time spent in psychoeducation, counseling and coordination of care.    This note was generated in part or whole with voice recognition software. Voice regonition is usually quite accurate but there are transcription errors that can and very often do occur. I apologize for any typographical errors that were not detected and corrected.     Rainey Pines, MD    12/17/2016, 3:04 PM

## 2017-02-04 ENCOUNTER — Ambulatory Visit (INDEPENDENT_AMBULATORY_CARE_PROVIDER_SITE_OTHER): Payer: Managed Care, Other (non HMO) | Admitting: Psychiatry

## 2017-02-04 ENCOUNTER — Encounter: Payer: Self-pay | Admitting: Psychiatry

## 2017-02-04 VITALS — BP 120/73 | HR 69 | Temp 98.4°F | Wt 182.8 lb

## 2017-02-04 DIAGNOSIS — F331 Major depressive disorder, recurrent, moderate: Secondary | ICD-10-CM

## 2017-02-04 DIAGNOSIS — M797 Fibromyalgia: Secondary | ICD-10-CM | POA: Diagnosis not present

## 2017-02-04 MED ORDER — TEMAZEPAM 15 MG PO CAPS: 15.0000 mg | ORAL_CAPSULE | Freq: Every evening | ORAL | 2 refills | Status: DC | PRN

## 2017-02-04 MED ORDER — ESCITALOPRAM OXALATE 20 MG PO TABS: 20.0000 mg | ORAL_TABLET | ORAL | 1 refills | Status: DC

## 2017-02-04 MED ORDER — ARIPIPRAZOLE 5 MG PO TABS: 5.0000 mg | ORAL_TABLET | Freq: Every day | ORAL | 1 refills | Status: DC

## 2017-02-04 MED ORDER — PREGABALIN 50 MG PO CAPS: 50.0000 mg | ORAL_CAPSULE | Freq: Every day | ORAL | 1 refills | Status: DC

## 2017-02-04 NOTE — Progress Notes (Signed)
BH MD/PA/NP OP Progress Note  02/04/2017 3:24 PM Evelyn Bentley  MRN:  [id number]  Subjective:    Patient is a 61 year old widowed female who presented for the follow-up appointment. She reported that she has been well at her current medications. She appeared calm and alert during the interview. Patient reported that she is busy in her house doing her kitchen with her son. Patient reported that taking the Lyrica at night has been helpful. She is compliant with her medications. She reported that her fibromyalgia symptoms are also getting better with the help of the current combination of medication. She is not having any adverse reactions to her medications. We discussed about her medications in detail. She currently denied having any suicidal homicidal ideations or plans. She denied having any perceptual disturbances.  She is sleeping well with the help of Restoril 15 mg at bedtime.     Chief Complaint:  Chief Complaint    Follow-up; Medication Refill     Visit Diagnosis:     ICD-9-CM ICD-10-CM   1. MDD (major depressive disorder), recurrent episode, moderate (HCC) 296.32 F33.1   2. Fibromyalgia syndrome 729.1 M79.7     Past Medical History:  Past Medical History:  Diagnosis Date  . Anxiety   . Arthritis   . Bursitis    Rt hip  . Complication of anesthesia    nausea  . Depression   . Fibromyalgia   . GERD (gastroesophageal reflux disease)   . Hypercholesteremia   . Hypertension   . Hypothyroidism   . Thyroid disease     Past Surgical History:  Procedure Laterality Date  . ABDOMINAL HYSTERECTOMY    . CHOLECYSTECTOMY    . COLONOSCOPY WITH PROPOFOL N/A 11/13/2016   Procedure: COLONOSCOPY WITH PROPOFOL;  Surgeon: Lollie Sails, MD;  Location[facility] ENDOSCOPY;  Service: Endoscopy;  Laterality: N/A;  . ESOPHAGOGASTRODUODENOSCOPY (EGD) WITH PROPOFOL N/A 11/13/2016   Procedure: ESOPHAGOGASTRODUODENOSCOPY (EGD) WITH PROPOFOL;  Surgeon: Lollie Sails, MD;  Location[facility]  ENDOSCOPY;  Service: Endoscopy;  Laterality: N/A;  . LAPAROSCOPIC HYSTERECTOMY Bilateral 02/28/2016   Procedure: HYSTERECTOMY TOTAL LAPAROSCOPIC / BSO;  Surgeon: Honor Loh Ward, MD;  Location: [facility] ORS;  Service: Gynecology;  Laterality: Bilateral;  . NECK SURGERY    . NISSEN FUNDOPLICATION    . TUBAL LIGATION    . URETEROSCOPY WITH HOLMIUM LASER LITHOTRIPSY Left 07/03/2016   Procedure: URETEROSCOPY WITH HOLMIUM LASER LITHOTRIPSY;  Surgeon: Royston Cowper, MD;  Location: [facility] ORS;  Service: Urology;  Laterality: Left;   Family History:  Family History  Problem Relation Age of Onset  . Depression Mother   . Anxiety disorder Mother   . Breast cancer Neg Hx    Social History:  Social History   Social History  . Marital status: Widowed    Spouse name: N/A  . Number of children: N/A  . Years of education: N/A   Social History Main Topics  . Smoking status: Former Smoker    Start date: 11/14/1974    Quit date: 05/14/2014  . Smokeless tobacco: Never Used  . Alcohol use 0.0 - 0.6 oz/week     Comment: social rare  . Drug use: No  . Sexual activity: Not Currently   Other Topics Concern  . None   Social History Narrative  . None   Additional History:   Patient is currently widowed and is living with her family members.  Assessment:   Musculoskeletal: Strength & Muscle Tone: within normal limits Gait & Station:  normal Patient leans: N/A  Psychiatric Specialty Exam: Medication Refill     ROS  Blood pressure 120/73, pulse 69, temperature 98.4 F (36.9 C), temperature source Oral, weight 182 lb 12.8 oz (82.9 kg).Body mass index is 35.7 kg/m.  General Appearance: Casual  Eye Contact:  Fair  Speech:  Clear and Coherent and Normal Rate  Volume:  Normal  Mood:  Anxious and Depressed  Affect:  Appropriate  Thought Process:  Coherent and Goal Directed  Orientation:  Full (Time, Place, and Person)  Thought Content:  WDL  Suicidal Thoughts:  No  Homicidal Thoughts:  No   Memory:  Immediate;   Fair  Judgement:  Fair  Insight:  Fair  Psychomotor Activity:  Normal  Concentration:  Fair  Recall:  [employer] of Knowledge: Fair  Language: Fair  Akathisia:  No  Handed:  Right  AIMS (if indicated):    Assets:  Communication Skills Desire for Improvement Physical Health Social Support  ADL's:  Intact  Cognition: WNL  Sleep:     Is the patient at risk to self?  No. Has the patient been a risk to self in the past 6 months?  No. Has the patient been a risk to self within the distant past?  No. Is the patient a risk to others?  No. Has the patient been a risk to others in the past 6 months?  No. Has the patient been a risk to others within the distant past?  No.  Current Medications: Current Outpatient Prescriptions  Medication Sig Dispense Refill  . ARIPiprazole (ABILIFY) 5 MG tablet Take 1 tablet (5 mg total) by mouth daily. 90 tablet 1  . atorvastatin (LIPITOR) 10 MG tablet Take 20 mg by mouth daily.    . baclofen (LIORESAL) 10 MG tablet     . celecoxib (CELEBREX) 200 MG capsule     . Cholecalciferol (VITAMIN D3) 1000 units CAPS Take 1 capsule by mouth daily.    Marland Kitchen docusate sodium (COLACE) 100 MG capsule Take 2 capsules (200 mg total) by mouth 2 (two) times daily. 120 capsule 3  . escitalopram (LEXAPRO) 20 MG tablet Take 1 tablet (20 mg total) by mouth every morning. 90 tablet 1  . estradiol (CLIMARA - DOSED IN MG/24 HR) 0.0375 mg/24hr patch     . fluticasone (FLONASE) 50 MCG/ACT nasal spray Place into the nose.    . ibuprofen (ADVIL,MOTRIN) 600 MG tablet Take 1 tablet (600 mg total) by mouth every 6 (six) hours. 65 tablet 0  . levothyroxine (SYNTHROID, LEVOTHROID) 125 MCG tablet     . lisinopril-hydrochlorothiazide (PRINZIDE,ZESTORETIC) 10-12.5 MG tablet Take 1 tablet by mouth daily.   2  . pantoprazole (PROTONIX) 40 MG tablet Take by mouth.    . pregabalin (LYRICA) 50 MG capsule Take 1 capsule (50 mg total) by mouth at bedtime. 90 capsule 1  .  sucralfate (CARAFATE) 1 g tablet     . temazepam (RESTORIL) 15 MG capsule Take 1 capsule (15 mg total) by mouth at bedtime as needed for sleep. 30 capsule 2   No current facility-administered medications for this visit.     Medical Decision Making:  Review of Psycho-Social Stressors (1)  Treatment Plan Summary:Medication management  She will continue on Lexapro 20 mg daily She will continue on Abilify 5  mg Daily. Also started her on temazepam 15 mg by mouth daily at bedtime for insomnia.  Patient will continue  Lyrica 150 mg at bedtime as she is feeling very tired  and sleepy during the daytime.  She will follow-up in 3 month or earlier depending on her symptoms.   Discussed with her about the adverse effects of the medication in detail and she demonstrated understanding   More than 50% of the time spent in psychoeducation, counseling and coordination of care.    This note was generated in part or whole with voice recognition software. Voice regonition is usually quite accurate but there are transcription errors that can and very often do occur. I apologize for any typographical errors that were not detected and corrected.     Rainey Pines, MD    02/04/2017, 3:24 PM

## 2017-02-26 ENCOUNTER — Ambulatory Visit (INDEPENDENT_AMBULATORY_CARE_PROVIDER_SITE_OTHER): Payer: Managed Care, Other (non HMO) | Admitting: Licensed Clinical Social Worker

## 2017-02-26 DIAGNOSIS — F331 Major depressive disorder, recurrent, moderate: Secondary | ICD-10-CM

## 2017-02-26 NOTE — Progress Notes (Signed)
Comprehensive Clinical Assessment (CCA) Note  02/26/2017 Evelyn Bentley [id number]  Visit Diagnosis:      ICD-9-CM ICD-10-CM   1. MDD (major depressive disorder), recurrent episode, moderate (HCC) 296.32 F33.1       CCA Part One  Part One has been completed on paper by the patient.  (See scanned document in Chart Review)  CCA Part Two A  Intake/Chief Complaint:  CCA Intake With Chief Complaint Chief Complaint/Presenting Problem: I am just miserable Patients Currently Reported Symptoms/Problems: I am depressed.  My family is driving me crazy.  I am tired of feeling this way.  All I want to do is sleep.  I have no energy.  I don't eat much but my appetite hasn't changed.  I have medication to help me sleep at night.  When I am at work all I think about is getting sleep.  I have felt this way for about a month.  My family has put me in a hole again.  My son called and said my credit card is declined.  He told her that she is $800 in the hole.  I lost my son and husband in 2015; a few months apart.  My son, his wife and their children live with me.  We clash a lot.  My son is not the best provider.  I support 6 people. I can't keep carrying the load. I am afriad to make people upset with me.  I feel like they will not like me if I do. My first thought when someone is upset is are they mad at me.  My husband was controlling and everything was my fault. I feel like everything is my fault.  I am smart enough to know that its not my fault, but i can't get around it. Individual's Strengths: taking care of people Individual's Preferences: not allow my son and his family live with [place]. Individual's Abilities: communicates well Type of Services Patient Feels Are Needed: therapy & medication  Mental Health Symptoms Depression:  Depression: Change in energy/activity, Difficulty Concentrating, Sleep (too much or little), Tearfulness  Mania:  Mania: N/A  Anxiety:   Anxiety: N/A  Psychosis:  Psychosis:  N/A  Trauma:  Trauma: N/A  Obsessions:  Obsessions: N/A  Compulsions:  Compulsions: N/A  Inattention:     Hyperactivity/Impulsivity:  Hyperactivity/Impulsivity: N/A  Oppositional/Defiant Behaviors:  Oppositional/Defiant Behaviors: N/A  Borderline Personality:  Emotional Irregularity: N/A  Other Mood/Personality Symptoms:      Mental Status Exam Appearance and self-care  Stature:  Stature: Small  Weight:  Weight: Overweight  Clothing:  Clothing: Neat/clean  Grooming:  Grooming: Normal  Cosmetic use:  Cosmetic Use: Age appropriate  Posture/gait:  Posture/Gait: Normal  Motor activity:  Motor Activity: Not Remarkable  Sensorium  Attention:  Attention: Normal  Concentration:  Concentration: Normal  Orientation:  Orientation: X5  Recall/memory:  Recall/Memory: Normal  Affect and Mood  Affect:  Affect: Appropriate  Mood:  Mood: Depressed  Relating  Eye contact:  Eye Contact: Normal  Facial expression:  Facial Expression: Responsive  Attitude toward examiner:  Attitude Toward Examiner: Cooperative  Thought and Language  Speech flow: Speech Flow: Normal  Thought content:  Thought Content: Appropriate to mood and circumstances  Preoccupation:     Hallucinations:     Organization:     Transport planner of Knowledge:  Fund of Knowledge: Average  Intelligence:  Intelligence: Average  Abstraction:  Abstraction: Normal  Judgement:  Judgement: Normal  Reality Testing:  Reality  Testing: Adequate  Insight:  Insight: Good  Decision Making:  Decision Making: Normal  Social Functioning  Social Maturity:  Social Maturity: Responsible  Social Judgement:  Social Judgement: Normal  Stress  Stressors:  Stressors: Family conflict, Money  Coping Ability:  Coping Ability: English as a second language teacher Deficits:     Supports:      Family and Psychosocial History: Family history Marital status: Widowed Widowed, when?: 2015 Are you sexually active?: No What is your sexual orientation?:  heterosexual Does patient have children?: Yes How many children?: 3 Evelyn Bentley (deceased at age 59), Evelyn Bentley (deceased at age 79), Evelyn Bentley 31, Evelyn Bentley 75 ) How is patient's relationship with their children?: Evelyn Bentley a foster child that had special needs, Evelyn Bentley had baddon's disease, Evelyn Bentley currently lives in her home.  We have a good relationship.  I love him.  We get along.  Evelyn Bentley. She lives in the home.  SHe has physical and emotional handicapps.  Have a good relationship.  Childhood History:  Childhood History By whom was/is the patient raised?: Mother/father and step-parent Additional childhood history information: Born in [place].  Describes childhood as: mother and father divorced before her 1st grade year.  Mother remarried when she was 26.  Description of patient's relationship with caregiver when they were a child: Mother: I felt responsible for her happiness. Father: I wasnt really close with him.  I saw him frequently after they divorced but after we moved I don't remember much.  Moved around 46 yrs old. Stepfather: he was wonderful.  "He was my hero." He sad mom and I. Patient's description of current relationship with people who raised him/her: Mother: deceased. Father: deceased Stepfather: deceased How were you disciplined when you got in trouble as a child/adolescent?: spanking once for lying. I was a really good kid Does patient have siblings?: Yes Number of Siblings: 1 (Evelyn Bentley 22) Description of patient's current relationship with siblings: she was adopted when i was about 87.  we were close after mom got sick. we don't speak now Did patient suffer any verbal/emotional/physical/sexual abuse as a child?: No Did patient suffer from severe childhood neglect?: No Has patient ever been sexually abused/assaulted/raped as an adolescent or adult?: No Was the patient ever a victim of a crime or a disaster?: No Witnessed domestic violence?: No Has patient been effected by domestic violence as  an adult?: No  CCA Part Two B  Employment/Work Situation: Employment / Work Copywriter, advertising Employment situation: Employed Where is patient currently employed?: [employer] How long has patient been employed?: 53yrs Patient's job has been impacted by current illness: No What is the longest time patient has a held a job?: 25 yrs Where was the patient employed at that time?: [employer] Has patient ever been in the [employer]?: No  Education: Education Last Grade Completed: 12 Name of [place]: [place] Did Teacher, adult education From [employer]?: Yes Did Physicist, medical?: No Did You Have An Individualized Education Program (IIEP): No Did You Have Any Difficulty At [employer]?: No  Religion: Religion/Spirituality Are You A Religious Person?: Yes What is Your Religious Affiliation?: Baptist How Might This Affect Treatment?: denies  Leisure/Recreation: Leisure / Recreation Leisure and Hobbies: playing with grandkids, crocheting, knitting, reading  Exercise/Diet: Exercise/Diet Do You Exercise?: No Have You Gained or Lost A Significant Amount of Weight in the Past Six Months?: No Do You Follow a Special Diet?: No Do You Have Any Trouble Sleeping?: Yes Explanation of Sleeping Difficulties: takes medication to help with  sleep  CCA Part Two C  Alcohol/Drug Use: Alcohol / Drug Use Pain Medications: denies Prescriptions: Celebrex, Lyrica, Abilify, Lexapro, Lisinpril, Omeprazole, Crestor, Levoxil, Xanax, Risperdal Over the Counter: Allergy meds, flonase,  History of alcohol / drug use?: No history of alcohol / drug abuse                      CCA Part Three  ASAM's:  Six Dimensions of Multidimensional Assessment  Dimension 1:  Acute Intoxication and/or Withdrawal Potential:     Dimension 2:  Biomedical Conditions and Complications:     Dimension 3:  Emotional, Behavioral, or Cognitive Conditions and Complications:     Dimension 4:  Readiness to Change:      Dimension 5:  Relapse, Continued use, or Continued Problem Potential:     Dimension 6:  Recovery/Living Environment:      Substance use Disorder (SUD)    Social Function:  Social Functioning Social Maturity: Responsible Social Judgement: Normal  Stress:  Stress Stressors: Family conflict, Money Coping Ability: Overwhelmed Patient Takes Medications The Way The Doctor Instructed?: Yes Priority Risk: Low Acuity  Risk Assessment- Self-Harm Potential: Risk Assessment For Self-Harm Potential Thoughts of Self-Harm: No current thoughts Method: No plan Availability of Means: No access/NA  Risk Assessment -Dangerous to Others Potential: Risk Assessment For Dangerous to Others Potential Method: No Plan Availability of Means: No access or NA Intent: Vague intent or NA Notification Required: No need or identified person  DSM5 Diagnoses: Patient Active Problem List   Diagnosis Date Noted  . Gastroesophageal reflux disease 10/30/2016  . Ureterolithiasis 07/02/2016  . Renal colic on left side 66/59/9357  . Sepsis due to urinary tract infection ([place]) 07/02/2016  . Hydronephrosis with obstructing calculus 07/02/2016  . History of migraine headaches 05/02/2015  . Anxiety 04/18/2015  . Depression, major, recurrent, moderate ([place]) 04/18/2015  . H/O: hypothyroidism 03/06/2015  . Fibromyalgia 03/06/2015  . H/O arthritis 03/06/2015    Patient Centered Plan: Will complete at the next session with patient  Recommendations for Services/Supports/Treatments: Recommendations for Services/Supports/Treatments Recommendations For Services/Supports/Treatments: Individual Therapy, Medication Management  Treatment Plan Summary:    Referrals to Alternative Service(s): Referred to Alternative Service(s):   Place:   Date:   Time:    Referred to Alternative Service(s):   Place:   Date:   Time:    Referred to Alternative Service(s):   Place:   Date:   Time:    Referred to Alternative Service(s):    Place:   Date:   Time:     Lubertha South

## 2017-03-15 ENCOUNTER — Ambulatory Visit (INDEPENDENT_AMBULATORY_CARE_PROVIDER_SITE_OTHER): Payer: Managed Care, Other (non HMO) | Admitting: Licensed Clinical Social Worker

## 2017-03-15 DIAGNOSIS — F331 Major depressive disorder, recurrent, moderate: Secondary | ICD-10-CM | POA: Diagnosis not present

## 2017-03-19 NOTE — Progress Notes (Signed)
   THERAPIST PROGRESS NOTE  Session Time: 33mn  Participation Level: Active  Behavioral Response: Neat and Well GroomedAlertDepressed  Type of Therapy: Individual Therapy  Treatment Goals addressed: Coping  Interventions: CBT, Motivational Interviewing, Solution Focused, Supportive and Reframing  Summary: RTRANY CHERNICKis a 61y.o. female who presents with continued symptoms of her depression.   Therapist met with Patient in an initial therapy session to assess current mood and to build rapport. Therapist engaged Patient in discussion about her life and what is going well for her. Therapist provided support for Patient as she shared details about her life, her current stressors, mood, coping skills, her past, and her children. Therapist prompted Patient to discuss her support system and ways that she manages her daily stress, anger, and frustrations.   Suicidal/Homicidal: No  Therapist Response: LCSW discussed what psychotherapy is and is not and the importance of the therapeutic relationship to include open and honest communication between client and therapist and building trust.  Reviewed advantages and disadvantages of the therapeutic process and limitations to the therapeutic relationship including LCSW's role in maintaining the safety of the client, others and those in client's care.   Plan: Return again in 2 weeks.  Diagnosis: Axis I: Depression    Axis II: No diagnosis    NLubertha South LCSW 03/19/2017

## 2017-03-29 ENCOUNTER — Ambulatory Visit: Payer: Managed Care, Other (non HMO) | Admitting: Licensed Clinical Social Worker

## 2017-04-12 ENCOUNTER — Ambulatory Visit: Payer: Managed Care, Other (non HMO) | Admitting: Licensed Clinical Social Worker

## 2017-05-06 ENCOUNTER — Encounter: Payer: Self-pay | Admitting: Psychiatry

## 2017-05-06 ENCOUNTER — Ambulatory Visit (INDEPENDENT_AMBULATORY_CARE_PROVIDER_SITE_OTHER): Payer: Managed Care, Other (non HMO) | Admitting: Psychiatry

## 2017-05-06 VITALS — BP 103/65 | HR 99 | Temp 97.5°F | Wt 177.0 lb

## 2017-05-06 DIAGNOSIS — R29898 Other symptoms and signs involving the musculoskeletal system: Secondary | ICD-10-CM

## 2017-05-06 DIAGNOSIS — M25569 Pain in unspecified knee: Secondary | ICD-10-CM | POA: Insufficient documentation

## 2017-05-06 DIAGNOSIS — M25669 Stiffness of unspecified knee, not elsewhere classified: Secondary | ICD-10-CM | POA: Insufficient documentation

## 2017-05-06 DIAGNOSIS — S82009A Unspecified fracture of unspecified patella, initial encounter for closed fracture: Secondary | ICD-10-CM

## 2017-05-06 DIAGNOSIS — R42 Dizziness and giddiness: Secondary | ICD-10-CM | POA: Insufficient documentation

## 2017-05-06 DIAGNOSIS — F331 Major depressive disorder, recurrent, moderate: Secondary | ICD-10-CM

## 2017-05-06 DIAGNOSIS — H6980 Other specified disorders of Eustachian tube, unspecified ear: Secondary | ICD-10-CM | POA: Insufficient documentation

## 2017-05-06 DIAGNOSIS — H6990 Unspecified Eustachian tube disorder, unspecified ear: Secondary | ICD-10-CM | POA: Insufficient documentation

## 2017-05-06 DIAGNOSIS — M797 Fibromyalgia: Secondary | ICD-10-CM | POA: Diagnosis not present

## 2017-05-06 DIAGNOSIS — H903 Sensorineural hearing loss, bilateral: Secondary | ICD-10-CM | POA: Insufficient documentation

## 2017-05-06 DIAGNOSIS — M7070 Other bursitis of hip, unspecified hip: Secondary | ICD-10-CM | POA: Insufficient documentation

## 2017-05-06 DIAGNOSIS — G8929 Other chronic pain: Secondary | ICD-10-CM | POA: Insufficient documentation

## 2017-05-06 DIAGNOSIS — S73109A Unspecified sprain of unspecified hip, initial encounter: Secondary | ICD-10-CM | POA: Insufficient documentation

## 2017-05-06 HISTORY — DX: Pain in unspecified knee: M25.569

## 2017-05-06 HISTORY — DX: Other symptoms and signs involving the musculoskeletal system: R29.898

## 2017-05-06 HISTORY — DX: Unspecified fracture of unspecified patella, initial encounter for closed fracture: S82.009A

## 2017-05-06 MED ORDER — TEMAZEPAM 15 MG PO CAPS: 15.0000 mg | ORAL_CAPSULE | Freq: Every evening | ORAL | 2 refills | Status: DC | PRN

## 2017-05-06 MED ORDER — ARIPIPRAZOLE 5 MG PO TABS: 5.0000 mg | ORAL_TABLET | Freq: Every day | ORAL | 1 refills | Status: DC

## 2017-05-06 NOTE — Progress Notes (Signed)
BH MD/PA/NP OP Progress Note  05/06/2017 10:11 AM Evelyn Bentley  MRN:  [id number]  Subjective:    Patient is a 61 year old widowed female who presented for the follow-up appointment. She appeared well-groomed as usual. She reported that she was started on Cymbalta by her primary care physician due to worsening of her fibromyalgia and hypothyroidism symptoms. She also takes Lyrica. She reported that she was initially given higher dose of levothyroxine as her thyroid was out of control. Now she has been stable and is taking Synthroid 150 mcg on a daily basis. Patient reported that she continues to feel very tired and drowsy during the early morning hours due to high dose of Lyrica. We discussed about her medications. She reported that she will discuss with her PCP about the dose of Lyrica. She reported that she is feeling better and has already stopped the Lexapro. She denied having any side effects of the other medications. She denied having any suicidal homicidal ideations or plans.    She is sleeping well with the help of Restoril 15 mg at bedtime.     Chief Complaint:  Chief Complaint    Follow-up; Medication Refill     Visit Diagnosis:     ICD-10-CM   1. MDD (major depressive disorder), recurrent episode, moderate (HCC) F33.1   2. Fibromyalgia syndrome M79.7     Past Medical History:  Past Medical History:  Diagnosis Date  . Anxiety   . Arthritis   . Bursitis    Rt hip  . Complication of anesthesia    nausea  . Depression   . Fibromyalgia   . GERD (gastroesophageal reflux disease)   . Hypercholesteremia   . Hypertension   . Hypothyroidism   . Thyroid disease     Past Surgical History:  Procedure Laterality Date  . ABDOMINAL HYSTERECTOMY    . CHOLECYSTECTOMY    . COLONOSCOPY WITH PROPOFOL N/A 11/13/2016   Procedure: COLONOSCOPY WITH PROPOFOL;  Surgeon: Lollie Sails, MD;  [facility] ENDOSCOPY;  Service: Endoscopy;  Laterality: N/A;  .  ESOPHAGOGASTRODUODENOSCOPY (EGD) WITH PROPOFOL N/A 11/13/2016   Procedure: ESOPHAGOGASTRODUODENOSCOPY (EGD) WITH PROPOFOL;  Surgeon: Lollie Sails, MD;  Location: [facility] ENDOSCOPY;  Service: Endoscopy;  Laterality: N/A;  . LAPAROSCOPIC HYSTERECTOMY Bilateral 02/28/2016   Procedure: HYSTERECTOMY TOTAL LAPAROSCOPIC / BSO;  Surgeon: Honor Loh Ward, MD;  Location: [facility] ORS;  Service: Gynecology;  Laterality: Bilateral;  . NECK SURGERY    . NISSEN FUNDOPLICATION    . TUBAL LIGATION    . URETEROSCOPY WITH HOLMIUM LASER LITHOTRIPSY Left 07/03/2016   Procedure: URETEROSCOPY WITH HOLMIUM LASER LITHOTRIPSY;  Surgeon: Royston Cowper, MD;  Location: [facility] ORS;  Service: Urology;  Laterality: Left;   Family History:  Family History  Problem Relation Age of Onset  . Depression Mother   . Anxiety disorder Mother   . Breast cancer Neg Hx    Social History:  Social History   Social History  . Marital status: Widowed    Spouse name: N/A  . Number of children: N/A  . Years of education: N/A   Social History Main Topics  . Smoking status: Former Smoker    Start date: 11/14/1974    Quit date: 05/14/2014  . Smokeless tobacco: Never Used  . Alcohol use 0.0 - 0.6 oz/week     Comment: social rare  . Drug use: No  . Sexual activity: Not Currently   Other Topics Concern  . None   Social History Narrative  .  None   Additional History:   Patient is currently widowed and is living with her family members.  Assessment:   Musculoskeletal: Strength & Muscle Tone: within normal limits Gait & Station: normal Patient leans: N/A  Psychiatric Specialty Exam: Medication Refill     ROS  There were no vitals taken for this visit.There is no height or weight on file to calculate BMI.  General Appearance: Casual  Eye Contact:  Fair  Speech:  Clear and Coherent and Normal Rate  Volume:  Normal  Mood:  Anxious and Depressed  Affect:  Appropriate  Thought Process:  Coherent and Goal Directed   Orientation:  Full (Time, Place, and Person)  Thought Content:  WDL  Suicidal Thoughts:  No  Homicidal Thoughts:  No  Memory:  Immediate;   Fair  Judgement:  Fair  Insight:  Fair  Psychomotor Activity:  Normal  Concentration:  Fair  Recall:  [employer] of Knowledge: Fair  Language: Fair  Akathisia:  No  Handed:  Right  AIMS (if indicated):    Assets:  Communication Skills Desire for Improvement Physical Health Social Support  ADL's:  Intact  Cognition: WNL  Sleep:     Is the patient at risk to self?  No. Has the patient been a risk to self in the past 6 months?  No. Has the patient been a risk to self within the distant past?  No. Is the patient a risk to others?  No. Has the patient been a risk to others in the past 6 months?  No. Has the patient been a risk to others within the distant past?  No.  Current Medications: Current Outpatient Prescriptions  Medication Sig Dispense Refill  . ARIPiprazole (ABILIFY) 5 MG tablet Take 1 tablet (5 mg total) by mouth daily. 90 tablet 1  . atorvastatin (LIPITOR) 10 MG tablet Take 20 mg by mouth daily.    . baclofen (LIORESAL) 10 MG tablet     . celecoxib (CELEBREX) 200 MG capsule     . Cholecalciferol (VITAMIN D3) 1000 units CAPS Take 1 capsule by mouth daily.    Marland Kitchen docusate sodium (COLACE) 100 MG capsule Take 2 capsules (200 mg total) by mouth 2 (two) times daily. 120 capsule 3  . escitalopram (LEXAPRO) 20 MG tablet Take 1 tablet (20 mg total) by mouth every morning. 90 tablet 1  . estradiol (CLIMARA - DOSED IN MG/24 HR) 0.0375 mg/24hr patch     . fluticasone (FLONASE) 50 MCG/ACT nasal spray Place into the nose.    . ibuprofen (ADVIL,MOTRIN) 600 MG tablet Take 1 tablet (600 mg total) by mouth every 6 (six) hours. 65 tablet 0  . levothyroxine (SYNTHROID, LEVOTHROID) 125 MCG tablet     . lisinopril-hydrochlorothiazide (PRINZIDE,ZESTORETIC) 10-12.5 MG tablet Take 1 tablet by mouth daily.   2  . pantoprazole (PROTONIX) 40 MG tablet  Take by mouth.    . pregabalin (LYRICA) 50 MG capsule Take 1 capsule (50 mg total) by mouth at bedtime. 90 capsule 1  . sucralfate (CARAFATE) 1 g tablet     . temazepam (RESTORIL) 15 MG capsule Take 1 capsule (15 mg total) by mouth at bedtime as needed for sleep. 30 capsule 2   No current facility-administered medications for this visit.     Medical Decision Making:  Review of Psycho-Social Stressors (1)  Treatment Plan Summary:Medication management  Lexapro discontinued as she is getting Cymbalta from her primary care physician. She will continue on Abilify 5  mg Daily.  Continue  temazepam 15 mg by mouth daily at bedtime for insomnia.  Patient will continue  Lyrica 75 bid by the PCP   She will follow-up in 3 month or earlier depending on her symptoms.   Discussed with her about the adverse effects of the medication in detail and she demonstrated understanding   More than 50% of the time spent in psychoeducation, counseling and coordination of care.    This note was generated in part or whole with voice recognition software. Voice regonition is usually quite accurate but there are transcription errors that can and very often do occur. I apologize for any typographical errors that were not detected and corrected.     Rainey Pines, MD    05/06/2017, 10:11 AM

## 2017-07-01 ENCOUNTER — Ambulatory Visit: Payer: Managed Care, Other (non HMO) | Admitting: Psychiatry

## 2017-08-01 ENCOUNTER — Other Ambulatory Visit: Payer: Self-pay | Admitting: Psychiatry

## 2017-08-06 NOTE — Telephone Encounter (Signed)
Pt called states she is out of her medication. She made appt to see the new doctor on  08-20-17. Pt last seen dr. Gretel Acre on  05-06-17.  No showed on appt 07-01-17.   temazepam (RESTORIL) 15 MG capsule  Medication  Date: 05/06/2017 Department: [facility] Ordering/Authorizing: Rainey Pines, MD  Order Providers   Prescribing Provider Encounter Provider  Rainey Pines, MD Rainey Pines, MD  Medication Detail    Disp Refills Start End   temazepam (RESTORIL) 15 MG capsule 30 capsule 2 05/06/2017    Sig - Route: Take 1 capsule (15 mg total) by mouth at bedtime as needed for sleep. - Oral   Class: Print

## 2017-08-06 NOTE — Telephone Encounter (Signed)
Will defer to Dr. Faheem

## 2017-08-15 ENCOUNTER — Other Ambulatory Visit (HOSPITAL_COMMUNITY): Payer: Self-pay | Admitting: Psychiatry

## 2017-08-15 MED ORDER — TEMAZEPAM 15 MG PO CAPS: 15.0000 mg | ORAL_CAPSULE | Freq: Every evening | ORAL | 0 refills | Status: DC | PRN

## 2017-08-15 NOTE — Telephone Encounter (Signed)
PT CALLED STATES THAT SHE NEEDS A REFILL ON HER RESTORIL 15MG  PT STATES SHE DOES NOT HAVE ENOUGH TO GET TO HER APPT.

## 2017-08-15 NOTE — Telephone Encounter (Signed)
faxed and confirmed rx for restoril 15mg  id [id number]  order # [id number] # 10 tablets enough to do until appt.

## 2017-08-15 NOTE — Telephone Encounter (Signed)
Ok, will give 10 tablets.

## 2017-08-20 ENCOUNTER — Ambulatory Visit: Payer: 59 | Admitting: Psychiatry

## 2017-08-20 ENCOUNTER — Ambulatory Visit (INDEPENDENT_AMBULATORY_CARE_PROVIDER_SITE_OTHER): Payer: 59 | Admitting: Psychiatry

## 2017-08-20 ENCOUNTER — Encounter: Payer: Self-pay | Admitting: Psychiatry

## 2017-08-20 VITALS — BP 130/78 | HR 97 | Temp 97.8°F | Wt 178.4 lb

## 2017-08-20 DIAGNOSIS — F331 Major depressive disorder, recurrent, moderate: Secondary | ICD-10-CM

## 2017-08-20 MED ORDER — DULOXETINE HCL 60 MG PO CPEP: 60.0000 mg | ORAL_CAPSULE | Freq: Every day | ORAL | 0 refills | Status: DC

## 2017-08-20 MED ORDER — TEMAZEPAM 15 MG PO CAPS: 15.0000 mg | ORAL_CAPSULE | Freq: Every evening | ORAL | 1 refills | Status: DC | PRN

## 2017-08-20 NOTE — Progress Notes (Signed)
MD Progress Note  08/20/2017 1:04 PM Evelyn Bentley  MRN:  [id number]  Chief Complaint: "I am OK.'  Chief Complaint    Follow-up; Medication Refill     HPI: Evelyn Bentley is a 72 y old CF,who is widowed , has a hx of MDD ,lives in [place] , [place] has a hx of depression, presented today to [facility] for a follow up. Evelyn Bentley today appears calm, cooperative and pleasant. Evelyn Bentley today reports that Evelyn Bentley has been doing well. Evelyn Bentley is currently on Cymbalta 60 mg po daily for her mood sx as well as fibromyalgia. Evelyn Bentley reports Evelyn Bentley is tolerating it well and helps her better than the Lexapro that Evelyn Bentley was on before. Evelyn Bentley reports sleep as fair on the Restoril. Evelyn Bentley continues to work at [employer] and its going well. Evelyn Bentley does have periods during her day at work when Evelyn Bentley feels kind of drowsy and that bothers her.Evelyn Bentley reports that Dr.Faheem , her previous provider had asked her to make some changes with her medication dosing schedule and Evelyn Bentley has been doing the same.Evelyn Bentley is taking the Lyrica at bedtime along with Abilify and Temazepam and Evelyn Bentley continues to not see a change with feeling drowsy during the day. Evelyn Bentley reports appetite as fair. Evelyn Bentley continues to have stressors at home, financial issues and so on.    Visit Diagnosis:    ICD-10-CM   1. MDD (major depressive disorder), recurrent episode, moderate (HCC) F33.1 DULoxetine (CYMBALTA) 60 MG capsule    Past Psychiatric History: Has been following up with Dr.Faheem , diagnosis of MDD.  Past Medical History:  Past Medical History:  Diagnosis Date  . Anxiety   . Arthritis   . Bursitis    Rt hip  . Complication of anesthesia    nausea  . Depression   . Fibromyalgia   . GERD (gastroesophageal reflux disease)   . Hypercholesteremia   . Hypertension   . Hypothyroidism   . Thyroid disease     Past Surgical History:  Procedure Laterality Date  . ABDOMINAL HYSTERECTOMY    . CHOLECYSTECTOMY    . COLONOSCOPY WITH PROPOFOL N/A 11/13/2016   Procedure: COLONOSCOPY  WITH PROPOFOL;  Surgeon: Lollie Sails, MD;  Location: [facility] ENDOSCOPY;  Service: Endoscopy;  Laterality: N/A;  . ESOPHAGOGASTRODUODENOSCOPY (EGD) WITH PROPOFOL N/A 11/13/2016   Procedure: ESOPHAGOGASTRODUODENOSCOPY (EGD) WITH PROPOFOL;  Surgeon: Lollie Sails, MD;  Location: [facility] ENDOSCOPY;  Service: Endoscopy;  Laterality: N/A;  . LAPAROSCOPIC HYSTERECTOMY Bilateral 02/28/2016   Procedure: HYSTERECTOMY TOTAL LAPAROSCOPIC / BSO;  Surgeon: Honor Loh Ward, MD;  Location: [facility] ORS;  Service: Gynecology;  Laterality: Bilateral;  . NECK SURGERY    . NISSEN FUNDOPLICATION    . TUBAL LIGATION    . URETEROSCOPY WITH HOLMIUM LASER LITHOTRIPSY Left 07/03/2016   Procedure: URETEROSCOPY WITH HOLMIUM LASER LITHOTRIPSY;  Surgeon: Royston Cowper, MD;  Location: [facility] ORS;  Service: Urology;  Laterality: Left;    Family Psychiatric History: Mother with mental health issues as noted below.  Family History:  Family History  Problem Relation Age of Onset  . Depression Mother   . Anxiety disorder Mother   . Breast cancer Neg Hx     Social History: Widowed, lives in [place], employed as a Mudlogger at [employer], has supportive children , enjoys her grand children. Social History   Social History  . Marital status: Widowed    Spouse name: N/A  . Number of children: N/A  . Years of education: N/A  Social History Main Topics  . Smoking status: Former Smoker    Start date: 11/14/1974    Quit date: 05/14/2014  . Smokeless tobacco: Never Used  . Alcohol use 0.0 - 0.6 oz/week     Comment: social rare  . Drug use: No  . Sexual activity: Not Currently   Other Topics Concern  . None   Social History Narrative  . None    Allergies:  Allergies  Allergen Reactions  . Codeine Nausea Only    Metabolic Disorder Labs: No results found for: HGBA1C, MPG No results found for: PROLACTIN No results found for: CHOL, TRIG, HDL, CHOLHDL, VLDL, LDLCALC No results found for:  TSH  Therapeutic Level Labs: No results found for: LITHIUM No results found for: VALPROATE No components found for:  CBMZ  Current Medications: Current Outpatient Prescriptions  Medication Sig Dispense Refill  . ARIPiprazole (ABILIFY) 5 MG tablet Take 1 tablet (5 mg total) by mouth daily. 90 tablet 1  . atorvastatin (LIPITOR) 10 MG tablet Take 20 mg by mouth daily.    Marland Kitchen atorvastatin (LIPITOR) 20 MG tablet atorvastatin 20 mg tablet    . baclofen (LIORESAL) 10 MG tablet     . celecoxib (CELEBREX) 200 MG capsule     . Cholecalciferol (VITAMIN D3) 1000 units CAPS Take 1 capsule by mouth daily.    Marland Kitchen docusate sodium (COLACE) 100 MG capsule Take 2 capsules (200 mg total) by mouth 2 (two) times daily. 120 capsule 3  . estradiol (CLIMARA - DOSED IN MG/24 HR) 0.0375 mg/24hr patch     . fluticasone (FLONASE) 50 MCG/ACT nasal spray Place into the nose.    . ibuprofen (ADVIL,MOTRIN) 600 MG tablet Take 1 tablet (600 mg total) by mouth every 6 (six) hours. 65 tablet 0  . levothyroxine (SYNTHROID, LEVOTHROID) 125 MCG tablet     . levothyroxine (SYNTHROID, LEVOTHROID) 150 MCG tablet levothyroxine 150 mcg tablet    . lisinopril-hydrochlorothiazide (PRINZIDE,ZESTORETIC) 10-12.5 MG tablet Take 1 tablet by mouth daily.   2  . lisinopril-hydrochlorothiazide (PRINZIDE,ZESTORETIC) 10-12.5 MG tablet Take by mouth.    . naproxen (NAPROSYN) 500 MG tablet naproxen 500 mg tablet    . omeprazole (PRILOSEC) 40 MG capsule omeprazole 40 mg capsule,delayed release    . pantoprazole (PROTONIX) 40 MG tablet Take by mouth.    . pregabalin (LYRICA) 50 MG capsule Take 1 capsule (50 mg total) by mouth at bedtime. 90 capsule 1  . rosuvastatin (CRESTOR) 10 MG tablet Crestor 10 mg tablet    . sucralfate (CARAFATE) 1 g tablet     . temazepam (RESTORIL) 15 MG capsule Take 1 capsule (15 mg total) by mouth at bedtime as needed for sleep. 30 capsule 1  . atorvastatin (LIPITOR) 10 MG tablet atorvastatin 10 mg tablet    . DULoxetine  (CYMBALTA) 60 MG capsule Take 1 capsule (60 mg total) by mouth daily. 30 capsule 0  . fluticasone (FLONASE) 50 MCG/ACT nasal spray fluticasone 50 mcg/actuation nasal spray,suspension    . pantoprazole (PROTONIX) 40 MG tablet pantoprazole 40 mg tablet,delayed release     No current facility-administered medications for this visit.      Musculoskeletal: Strength & Muscle Tone: within normal limits Gait & Station: normal Patient leans: N/A  Psychiatric Specialty Exam: Review of Systems  Psychiatric/Behavioral: Positive for depression (improving).  All other systems reviewed and are negative.   Blood pressure 130/78, pulse 97, temperature 97.8 F (36.6 C), temperature source Oral, weight 178 lb 6.4 oz (80.9 kg).Body mass index  is 34.84 kg/m.  General Appearance: Casual  Eye Contact:  Fair  Speech:  Normal Rate  Volume:  Normal  Mood:  less anxious  Affect:  Congruent  Thought Process:  Goal Directed and Descriptions of Associations: Intact  Orientation:  Full (Time, Place, and Person)  Thought Content: Rumination   Suicidal Thoughts:  No  Homicidal Thoughts:  No  Memory:  Immediate;   Fair Recent;   Fair Remote;   Fair  Judgement:  Fair  Insight:  Fair  Psychomotor Activity:  Normal  Concentration:  Concentration: Fair and Attention Span: Fair  Recall:  [employer] of Knowledge: Fair  Language: Fair  Akathisia:  No  Handed:  Right  AIMS (if indicated):denies tremors or rigidity  Assets:  Communication Skills Desire for Improvement  ADL's:  Intact  Cognition: WNL  Sleep:  Fair    Assessment and Plan: Evelyn Bentley is a 53 y old CF who is widowed , has a hx of MDD, fibromyalgia , currently presents for a follow up visit. Evelyn Bentley has a hx of several stressors in the past like several deaths in the family. Her son died when he was 62 y old( neurological disease) and another son who had muscular dystrophy died at the age of 68. Her husband passed away soon after that within months. This  happened 2 yrs ago and that is when Evelyn Bentley was diagnosed with depression. Evelyn Bentley continues to have stressors at home with her son, financial issues and so on. Evelyn Bentley will continue CBT for the same. Evelyn Bentley reports being stable on current medications.Discussed medication time schedule changes to address her drowsiness. Evelyn Bentley continues to be a good candidate for outpatient treatment.  Plan: Continue Cymbalta 60 mg po daily - discussed to take it at bedtime- For depression. Continue Abilify 5 mg po qhs for augmenting the Cymbalta. Continue Restoril 15 mg po qhs for sleep. Continue Lyrica as prescribed for her fibromyalgia sx.( prescribed by Dr.Khan). Continue CBT . Follow up in 4 weeks.    Evelyn Alert, MD 08/20/2017, 1:04 PM

## 2017-09-02 ENCOUNTER — Other Ambulatory Visit: Payer: Self-pay | Admitting: Internal Medicine

## 2017-09-02 DIAGNOSIS — Z1231 Encounter for screening mammogram for malignant neoplasm of breast: Secondary | ICD-10-CM

## 2017-09-17 ENCOUNTER — Ambulatory Visit
Admission: RE | Admit: 2017-09-17 | Discharge: 2017-09-17 | Disposition: A | Discharge status: Home / Self Care | Payer: Managed Care, Other (non HMO) | Source: Ambulatory Visit | Attending: Internal Medicine | Admitting: Internal Medicine

## 2017-09-17 ENCOUNTER — Ambulatory Visit: Payer: 59 | Admitting: Psychiatry

## 2017-09-17 DIAGNOSIS — Z1231 Encounter for screening mammogram for malignant neoplasm of breast: Secondary | ICD-10-CM

## 2017-09-24 ENCOUNTER — Other Ambulatory Visit: Payer: Self-pay | Admitting: Psychiatry

## 2017-11-25 ENCOUNTER — Ambulatory Visit: Payer: 59 | Admitting: Psychiatry

## 2017-11-25 ENCOUNTER — Encounter: Payer: Self-pay | Admitting: Psychiatry

## 2017-11-25 ENCOUNTER — Other Ambulatory Visit: Payer: Self-pay

## 2017-11-25 VITALS — BP 132/86 | Temp 97.5°F | Wt 177.4 lb

## 2017-11-25 DIAGNOSIS — F331 Major depressive disorder, recurrent, moderate: Secondary | ICD-10-CM

## 2017-11-25 MED ORDER — ARIPIPRAZOLE 5 MG PO TABS: 2.5000 mg | ORAL_TABLET | Freq: Every day | ORAL | 0 refills | Status: DC

## 2017-11-25 MED ORDER — BUPROPION HCL 75 MG PO TABS: 75.0000 mg | ORAL_TABLET | Freq: Every day | ORAL | 2 refills | Status: DC

## 2017-11-25 MED ORDER — TEMAZEPAM 15 MG PO CAPS: 15.0000 mg | ORAL_CAPSULE | Freq: Every evening | ORAL | 2 refills | Status: DC | PRN

## 2017-11-25 MED ORDER — DULOXETINE HCL 60 MG PO CPEP: 60.0000 mg | ORAL_CAPSULE | Freq: Every day | ORAL | 0 refills | Status: DC

## 2017-11-25 NOTE — Patient Instructions (Signed)
       Bupropion tablets (Depression/Mood Disorders) What is this medicine? BUPROPION (byoo PROE pee on) is used to treat depression. This medicine may be used for other purposes; ask your health care provider or pharmacist if you have questions. COMMON BRAND NAME(S): Wellbutrin What should I tell my health care provider before I take this medicine? They need to know if you have any of these conditions: -an eating disorder, such as anorexia or bulimia -bipolar disorder or psychosis -diabetes or high blood sugar, treated with medication -glaucoma -heart disease, previous heart attack, or irregular heart beat -head injury or brain tumor -high blood pressure -kidney or liver disease -seizures -suicidal thoughts or a previous suicide attempt -Tourette's syndrome -weight loss -an unusual or allergic reaction to bupropion, other medicines, foods, dyes, or preservatives -breast-feeding -pregnant or trying to become pregnant How should I use this medicine? Take this medicine by mouth with a glass of water. Follow the directions on the prescription label. You can take it with or without food. If it upsets your stomach, take it with food. Take your medicine at regular intervals. Do not take your medicine more often than directed. Do not stop taking this medicine suddenly except upon the advice of your doctor. Stopping this medicine too quickly may cause serious side effects or your condition may worsen. A special MedGuide will be given to you by the pharmacist with each prescription and refill. Be sure to read this information carefully each time. Talk to your pediatrician regarding the use of this medicine in children. Special care may be needed. Overdosage: If you think you have taken too much of this medicine contact a poison control center or emergency room at once. NOTE: This medicine is only for you. Do not share this medicine with others. What if I miss a dose? If you miss a dose,  take it as soon as you can. If it is less than four hours to your next dose, take only that dose and skip the missed dose. Do not take double or extra doses. What may interact with this medicine? Do not take this medicine with any of the following medications: -linezolid -MAOIs like Azilect, Carbex, Eldepryl, Marplan, Nardil, and Parnate -methylene blue (injected into a vein) -other medicines that contain bupropion like Zyban This medicine may also interact with the following medications: -alcohol -certain medicines for anxiety or sleep -certain medicines for blood pressure like metoprolol, propranolol -certain medicines for depression or psychotic disturbances -certain medicines for HIV or AIDS like efavirenz, lopinavir, nelfinavir, ritonavir -certain medicines for irregular heart beat like propafenone, flecainide -certain medicines for Parkinson's disease like amantadine, levodopa -certain medicines for seizures like carbamazepine, phenytoin, phenobarbital -cimetidine -clopidogrel -cyclophosphamide -digoxin -furazolidone -isoniazid -nicotine -orphenadrine -procarbazine -steroid medicines like prednisone or cortisone -stimulant medicines for attention disorders, weight loss, or to stay awake -tamoxifen -theophylline -thiotepa -ticlopidine -tramadol -warfarin This list may not describe all possible interactions. Give your health care provider a list of all the medicines, herbs, non-prescription drugs, or dietary supplements you use. Also tell them if you smoke, drink alcohol, or use illegal drugs. Some items may interact with your medicine. What should I watch for while using this medicine? Tell your doctor if your symptoms do not get better or if they get worse. Visit your doctor or health care professional for regular checks on your progress. Because it may take several weeks to see the full effects of this medicine, it is important to continue your treatment as prescribed by    your doctor. Patients and their families should watch out for new or worsening thoughts of suicide or depression. Also watch out for sudden changes in feelings such as feeling anxious, agitated, panicky, irritable, hostile, aggressive, impulsive, severely restless, overly excited and hyperactive, or not being able to sleep. If this happens, especially at the beginning of treatment or after a change in dose, call your health care professional. Avoid alcoholic drinks while taking this medicine. Drinking excessive alcoholic beverages, using sleeping or anxiety medicines, or quickly stopping the use of these agents while taking this medicine may increase your risk for a seizure. Do not drive or use heavy machinery until you know how this medicine affects you. This medicine can impair your ability to perform these tasks. Do not take this medicine close to bedtime. It may prevent you from sleeping. Your mouth may get dry. Chewing sugarless gum or sucking hard candy, and drinking plenty of water may help. Contact your doctor if the problem does not go away or is severe. What side effects may I notice from receiving this medicine? Side effects that you should report to your doctor or health care professional as soon as possible: -allergic reactions like skin rash, itching or hives, swelling of the face, lips, or tongue -breathing problems -changes in vision -confusion -elevated mood, decreased need for sleep, racing thoughts, impulsive behavior -fast or irregular heartbeat -hallucinations, loss of contact with reality -increased blood pressure -redness, blistering, peeling or loosening of the skin, including inside the mouth -seizures -suicidal thoughts or other mood changes -unusually weak or tired -vomiting Side effects that usually do not require medical attention (report to your doctor or health care professional if they continue or are bothersome): -constipation -headache -loss of  appetite -nausea -tremors -weight loss This list may not describe all possible side effects. Call your doctor for medical advice about side effects. You may report side effects to FDA at 1-800-FDA-1088. Where should I keep my medicine? Keep out of the reach of children. Store at room temperature between 20 and 25 degrees C (68 and 77 degrees F), away from direct sunlight and moisture. Keep tightly closed. Throw away any unused medicine after the expiration date. NOTE: This sheet is a summary. It may not cover all possible information. If you have questions about this medicine, talk to your doctor, pharmacist, or health care provider.  2018 Elsevier/Gold Standard (2016-04-20 13:44:21)  
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
## 2017-11-26 ENCOUNTER — Encounter: Payer: Self-pay | Admitting: Psychiatry

## 2018-01-02 ENCOUNTER — Other Ambulatory Visit: Payer: Self-pay

## 2018-01-02 ENCOUNTER — Ambulatory Visit (INDEPENDENT_AMBULATORY_CARE_PROVIDER_SITE_OTHER): Payer: 59 | Admitting: Psychiatry

## 2018-01-02 ENCOUNTER — Encounter: Payer: Self-pay | Admitting: Psychiatry

## 2018-01-02 VITALS — BP 122/81 | HR 82 | Temp 98.3°F | Wt 176.4 lb

## 2018-01-02 DIAGNOSIS — F331 Major depressive disorder, recurrent, moderate: Secondary | ICD-10-CM | POA: Diagnosis not present

## 2018-01-02 DIAGNOSIS — G4710 Hypersomnia, unspecified: Secondary | ICD-10-CM | POA: Diagnosis not present

## 2018-01-02 MED ORDER — BUPROPION HCL ER (XL) 150 MG PO TB24: 150.0000 mg | ORAL_TABLET | Freq: Every day | ORAL | 1 refills | Status: DC

## 2018-01-02 MED ORDER — TEMAZEPAM 7.5 MG PO CAPS: 7.5000 mg | ORAL_CAPSULE | Freq: Every evening | ORAL | 1 refills | Status: DC | PRN

## 2018-01-02 MED ORDER — DULOXETINE HCL 60 MG PO CPEP: 60.0000 mg | ORAL_CAPSULE | Freq: Every day | ORAL | 0 refills | Status: DC

## 2018-01-02 NOTE — Progress Notes (Signed)
[facility] MD OP Progress Note  01/02/2018 4:00 PM Evelyn Bentley  MRN:  [id number]  Chief Complaint: ' I am still sleepy."  Chief Complaint    Follow-up; Medication Refill     HPI: Evelyn Bentley is a 62 year old Caucasian female who is widowed, has a history of MDD, lives in [place], has a history of depression, presented to the clinic today for a follow-up visit.  Evelyn Bentley continues to report hypersomnia during the day.  She reports she falls asleep during work and feels drowsy several times a day.  She reports she wonders what could be causing it.  She reports she is tolerating the Wellbutrin well.  She denies any side effects to the Wellbutrin at this time.  She however reports Wellbutrin has not helped with her excessive sleepiness during the day.  She denies any significant mood symptoms or sadness at this time.  She reports sleep is good .  She denies any psychosocial stressors and reports work is going well.  She reports she may have to try to take her medications like the Lyrica which she takes at night , earlier than how she is taking now.  She also reports she may have been taking Cymbalta a higher dose than what was prescribed by mistake.  Discussed medication changes with patient.  Discussed with her to complete an Epworth sleepiness scale.  She scored 9 on the same.  She denies any breath holding spells or apneic episodes at night.  She reports she had a sleep study done a few years ago and it came back within normal limits. Visit Diagnosis:    ICD-10-CM   1. MDD (major depressive disorder), recurrent episode, moderate (HCC) F33.1 buPROPion (WELLBUTRIN XL) 150 MG 24 hr tablet    DULoxetine (CYMBALTA) 60 MG capsule    temazepam (RESTORIL) 7.5 MG capsule  2. Hypersomnia, unspecified G47.10 buPROPion (WELLBUTRIN XL) 150 MG 24 hr tablet    Past Psychiatric History:Used to follow up with Dr. Gretel Acre in the past.  Diagnosis of MDD.  Past Medical History:  Past Medical History:  Diagnosis  Date  . Anxiety   . Arthritis   . Bursitis    Rt hip  . Complication of anesthesia    nausea  . Depression   . Fibromyalgia   . GERD (gastroesophageal reflux disease)   . Hypercholesteremia   . Hypertension   . Hypothyroidism   . Thyroid disease     Past Surgical History:  Procedure Laterality Date  . ABDOMINAL HYSTERECTOMY    . CHOLECYSTECTOMY    . COLONOSCOPY WITH PROPOFOL N/A 11/13/2016   Procedure: COLONOSCOPY WITH PROPOFOL;  Surgeon: Lollie Sails, MD;  Location: [facility] ENDOSCOPY;  Service: Endoscopy;  Laterality: N/A;  . ESOPHAGOGASTRODUODENOSCOPY (EGD) WITH PROPOFOL N/A 11/13/2016   Procedure: ESOPHAGOGASTRODUODENOSCOPY (EGD) WITH PROPOFOL;  Surgeon: Lollie Sails, MD;  Location: [facility] ENDOSCOPY;  Service: Endoscopy;  Laterality: N/A;  . LAPAROSCOPIC HYSTERECTOMY Bilateral 02/28/2016   Procedure: HYSTERECTOMY TOTAL LAPAROSCOPIC / BSO;  Surgeon: Honor Loh Ward, MD;  Location: [facility] ORS;  Service: Gynecology;  Laterality: Bilateral;  . NECK SURGERY    . NISSEN FUNDOPLICATION    . TUBAL LIGATION    . URETEROSCOPY WITH HOLMIUM LASER LITHOTRIPSY Left 07/03/2016   Procedure: URETEROSCOPY WITH HOLMIUM LASER LITHOTRIPSY;  Surgeon: Royston Cowper, MD;  Location: [facility] ORS;  Service: Urology;  Laterality: Left;    Family Psychiatric History: Mother with mental health issues.  Family History:  Family History  Problem Relation Age of  Onset  . Depression Mother   . Anxiety disorder Mother   . 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 Social History   Socioeconomic History  . Marital status: Widowed    Spouse name: None  . Number of children: 2  . Years of education: None  . Highest education level: High school graduate  Social Needs  . Financial resource strain: Not hard at all  . Food insecurity - worry: Never true  . Food insecurity -  inability: Never true  . Transportation needs - medical: No  . Transportation needs - non-medical: No  Occupational History    Comment: fulltime  Tobacco Use  . Smoking status: Former Smoker    Start date: 11/14/1974    Last attempt to quit: 05/14/2014    Years since quitting: 3.6  . Smokeless tobacco: Never Used  Substance and Sexual Activity  . Alcohol use: Yes    Alcohol/week: 0.0 - 0.6 oz    Comment: social rare  . Drug use: No  . Sexual activity: Not Currently  Other Topics Concern  . None  Social History Narrative  . None    Allergies:  Allergies  Allergen Reactions  . Codeine Nausea Only    Metabolic Disorder Labs: No results found for: HGBA1C, MPG No results found for: PROLACTIN No results found for: CHOL, TRIG, HDL, CHOLHDL, VLDL, LDLCALC No results found for: TSH  Therapeutic Level Labs: No results found for: LITHIUM No results found for: VALPROATE No components found for:  CBMZ  Current Medications: Current Outpatient Medications  Medication Sig Dispense Refill  . atorvastatin (LIPITOR) 10 MG tablet Take 20 mg by mouth daily.    Marland Kitchen atorvastatin (LIPITOR) 20 MG tablet atorvastatin 20 mg tablet    . baclofen (LIORESAL) 10 MG tablet     . buPROPion (WELLBUTRIN XL) 150 MG 24 hr tablet Take 1 tablet (150 mg total) by mouth daily. 30 tablet 1  . celecoxib (CELEBREX) 200 MG capsule     . Cholecalciferol (VITAMIN D3) 1000 units CAPS Take 1 capsule by mouth daily.    . DULoxetine (CYMBALTA) 60 MG capsule Take 1 capsule (60 mg total) by mouth at bedtime. 90 capsule 0  . estradiol (CLIMARA - DOSED IN MG/24 HR) 0.0375 mg/24hr patch     . fluticasone (FLONASE) 50 MCG/ACT nasal spray fluticasone 50 mcg/actuation nasal spray,suspension    . levothyroxine (SYNTHROID, LEVOTHROID) 150 MCG tablet levothyroxine 150 mcg tablet    . lisinopril-hydrochlorothiazide (PRINZIDE,ZESTORETIC) 10-12.5 MG tablet Take 1 tablet by mouth daily.   2  . pregabalin (LYRICA) 50 MG capsule Take 1  capsule (50 mg total) by mouth at bedtime. 90 capsule 1  . temazepam (RESTORIL) 7.5 MG capsule Take 1-2 capsules (7.5-15 mg total) by mouth at bedtime as needed for sleep. 60 capsule 1   No current facility-administered medications for this visit.      Musculoskeletal: Strength & Muscle Tone: within normal limits Gait & Station: normal Patient leans: N/A  Psychiatric Specialty Exam: Review of Systems  Psychiatric/Behavioral:       Excessive sleepiness  All other systems reviewed and are negative.   Blood pressure 122/81, pulse 82, temperature 98.3 F (36.8 C), temperature source Oral, weight 176 lb 6.4 oz (80 kg).Body mass index is 34.45 kg/m.  General Appearance: Casual  Eye Contact:  Fair  Speech:  Normal Rate  Volume:  Normal  Mood:  Euthymic  Affect:  Congruent  Thought Process:  Goal Directed and Descriptions of Associations: Intact  Orientation:  Full (Time, Place, and Person)  Thought Content: Logical   Suicidal Thoughts:  No  Homicidal Thoughts:  No  Memory:  Immediate;   Fair Recent;   Fair Remote;   Fair  Judgement:  Fair  Insight:  Fair  Psychomotor Activity:  Normal  Concentration:  Concentration: Fair and Attention Span: Fair  Recall:  [employer] of Knowledge: Fair  Language: Fair  Akathisia:  No  Handed:  Right  AIMS (if indicated): NA  Assets:  Communication Skills Desire for Improvement Housing Social Support Talents/Skills  ADL's:  Intact  Cognition: WNL  Sleep:  Fair   Screenings:EPWORTH sleep scale   Assessment and Plan: Elania is a 62 year old Caucasian female, widowed, has a history of MDD, fibromyalgia, presented to the clinic today for a follow-up visit.  She has a history of psychosocial stressors several deaths in the family, son died when he was 49 years old due to a neurological disease, another son who had muscular dystrophy died at the age of 63.  Her husband passed away soon after that within a few months.  All this happened 2  years ago and that is when her depression began.  She currently reports she is tolerating the current medications well denies any significant mood symptoms.  She however struggles with hypersomnia during the day.  Discussed plan as noted below.  Plan For depression Continue Cymbalta 60 mg p.o. nightly.  She reports she may have been taking 2 pills by mistake. Increase Wellbutrin to XL 150 mg p.o. daily Discontinue Abilify.  Hypersomnia Reduce restoril at bedtime to 7.5 mg p.o. nightly as needed. Patient completed an Epworth sleep scale and scored 9 on it. She reports she had a sleep study done and denies any apneic episodes.  Her sleep study was within normal limits in the past. Readjusted her medications as noted below.  We will continue to monitor.  Follow up in clinic in 1 month or sooner if needed.  More than 50 % of the time was spent for psychoeducation and supportive psychotherapy and care coordination.  This note was generated in part or whole with voice recognition software. Voice recognition is usually quite accurate but there are transcription errors that can and very often do occur. I apologize for any typographical errors that were not detected and corrected.      Ursula Alert, MD 01/03/2018, 12:29 PM

## 2018-01-02 NOTE — Patient Instructions (Signed)
Please reduce Temazepam to 7.5 mg at bedtime for sleep. Please take CYMBALTA 60 MG ONLY - 1 CAPSULE.

## 2018-01-03 ENCOUNTER — Encounter: Payer: Self-pay | Admitting: Psychiatry

## 2018-01-27 ENCOUNTER — Telehealth: Payer: Self-pay

## 2018-01-27 DIAGNOSIS — F331 Major depressive disorder, recurrent, moderate: Secondary | ICD-10-CM

## 2018-01-27 DIAGNOSIS — G4710 Hypersomnia, unspecified: Secondary | ICD-10-CM

## 2018-01-27 MED ORDER — BUPROPION HCL ER (XL) 150 MG PO TB24: 150.0000 mg | ORAL_TABLET | Freq: Every day | ORAL | 1 refills | Status: DC

## 2018-01-27 NOTE — Telephone Encounter (Signed)
Sent wellbutrin xl - 90 days supply.

## 2018-01-27 NOTE — Telephone Encounter (Signed)
  received a fax requesting a 90 day supply of the bupropion hcl xl 150 mg .  Pt was last seen on  01-02-18 next appt  01-30-18  buPROPion (WELLBUTRIN XL) 150 MG 24 hr tablet  Medication  Date: 01/02/2018 Department: [facility] Ordering/Authorizing: Ursula Alert, MD  Order Providers   Prescribing Provider Encounter Provider  Ursula Alert, MD Ursula Alert, MD  Medication Detail    Disp Refills Start End   buPROPion (WELLBUTRIN XL) 150 MG 24 hr tablet 30 tablet 1 01/02/2018    Sig - Route: Take 1 tablet (150 mg total) by mouth daily. - Oral   Sent to pharmacy as: buPROPion (WELLBUTRIN XL) 150 MG 24 hr tablet   E-Prescribing Status: Receipt confirmed by pharmacy (01/02/2018 4:18 PM EST)

## 2018-01-30 ENCOUNTER — Other Ambulatory Visit: Payer: Self-pay

## 2018-01-30 ENCOUNTER — Ambulatory Visit: Payer: 59 | Admitting: Psychiatry

## 2018-01-30 ENCOUNTER — Encounter: Payer: Self-pay | Admitting: Psychiatry

## 2018-01-30 VITALS — BP 112/74 | HR 99 | Temp 97.7°F | Wt 175.2 lb

## 2018-01-30 DIAGNOSIS — F331 Major depressive disorder, recurrent, moderate: Secondary | ICD-10-CM | POA: Diagnosis not present

## 2018-01-30 DIAGNOSIS — G4710 Hypersomnia, unspecified: Secondary | ICD-10-CM | POA: Diagnosis not present

## 2018-01-30 DIAGNOSIS — R413 Other amnesia: Secondary | ICD-10-CM

## 2018-01-30 DIAGNOSIS — F410 Panic disorder [episodic paroxysmal anxiety] without agoraphobia: Secondary | ICD-10-CM | POA: Diagnosis not present

## 2018-01-30 MED ORDER — HYDROXYZINE HCL 10 MG PO TABS: 10.0000 mg | ORAL_TABLET | Freq: Two times a day (BID) | ORAL | 1 refills | Status: DC | PRN

## 2018-01-30 MED ORDER — DULOXETINE HCL 20 MG PO CPEP: 20.0000 mg | ORAL_CAPSULE | Freq: Every day | ORAL | 1 refills | Status: DC

## 2018-01-30 MED ORDER — TEMAZEPAM 7.5 MG PO CAPS: 7.5000 mg | ORAL_CAPSULE | Freq: Every evening | ORAL | 1 refills | Status: DC | PRN

## 2018-01-30 MED ORDER — DULOXETINE HCL 60 MG PO CPEP: 60.0000 mg | ORAL_CAPSULE | Freq: Every day | ORAL | 0 refills | Status: DC

## 2018-01-30 NOTE — Progress Notes (Signed)
[place] [facility] MD OP Progress Note  01/30/2018 5:06 PM Evelyn Bentley  MRN:  [id number]  Chief Complaint: ' I am not doing well." Chief Complaint    Follow-up; Medication Refill     HPI: Evelyn Bentley is a 62 yr old Caucasian female who is widowed, has a history of MDD, lives in [place], has a history of depression, hypersomnia presented to the clinic today for a follow-up visit.  Patient today reports that she has been having some worsening symptoms since the past few weeks.  She reports she is currently dealing with some psychosocial stressors at home.  She reports her son and his wife are currently dealing with some relationship issues.  She reports they have 3 children who are 1, 64 and 25-year-old.  She reports she worries about her son and his wife separating and she is also worried about the children who are too small.  She reports that has increased her anxiety symptoms.  She reports she recently went into a panic mode.  She reports she never had panic attacks before before.  She reports she had chest pain, shortness of breath, chest pressure and so on which lasted for a few minutes.  She also reports she has been being more forgetful than before.  She reports she has been forgetting to take the exits on the road that she knows so well.  She also reports having some memory problems when it comes to conversations that she has had with her children.    She reports her primary medical doctor recently checked her thyroid and is currently managing it.  She reports she has increased her dosage recently.  She also is getting vitamin D replacement every week.  She reports sleep is okay on the temazepam.  She denies any suicidality. Visit Diagnosis:    ICD-10-CM   1. Panic attack F41.0 DULoxetine (CYMBALTA) 20 MG capsule    hydrOXYzine (ATARAX/VISTARIL) 10 MG tablet  2. MDD (major depressive disorder), recurrent episode, moderate (HCC) F33.1 DULoxetine (CYMBALTA) 60 MG capsule    DULoxetine (CYMBALTA) 20 MG  capsule    hydrOXYzine (ATARAX/VISTARIL) 10 MG tablet    DISCONTINUED: temazepam (RESTORIL) 7.5 MG capsule  3. Hypersomnia G47.10   4. Memory loss R41.3     Past Psychiatric History: Patient used to follow up with Dr. Gretel Acre in the past.  Diagnosis of MDD.  Past Medical History:  Past Medical History:  Diagnosis Date  . Anxiety   . Arthritis   . Bursitis    Rt hip  . Complication of anesthesia    nausea  . Depression   . Fibromyalgia   . GERD (gastroesophageal reflux disease)   . Hypercholesteremia   . Hypertension   . Hypothyroidism   . Thyroid disease     Past Surgical History:  Procedure Laterality Date  . ABDOMINAL HYSTERECTOMY    . CHOLECYSTECTOMY    . COLONOSCOPY WITH PROPOFOL N/A 11/13/2016   Procedure: COLONOSCOPY WITH PROPOFOL;  Surgeon: Lollie Sails, MD;  Location: [facility] ENDOSCOPY;  Service: Endoscopy;  Laterality: N/A;  . ESOPHAGOGASTRODUODENOSCOPY (EGD) WITH PROPOFOL N/A 11/13/2016   Procedure: ESOPHAGOGASTRODUODENOSCOPY (EGD) WITH PROPOFOL;  Surgeon: Lollie Sails, MD;  Location: [facility] ENDOSCOPY;  Service: Endoscopy;  Laterality: N/A;  . LAPAROSCOPIC HYSTERECTOMY Bilateral 02/28/2016   Procedure: HYSTERECTOMY TOTAL LAPAROSCOPIC / BSO;  Surgeon: Honor Loh Ward, MD;  Location: [facility] ORS;  Service: Gynecology;  Laterality: Bilateral;  . NECK SURGERY    . NISSEN FUNDOPLICATION    . TUBAL  LIGATION    . URETEROSCOPY WITH HOLMIUM LASER LITHOTRIPSY Left 07/03/2016   Procedure: URETEROSCOPY WITH HOLMIUM LASER LITHOTRIPSY;  Surgeon: Royston Cowper, MD;  Location: [facility] ORS;  Service: Urology;  Laterality: Left;    Family Psychiatric History: Mother with mental health issues.  Family History:  Family History  Problem Relation Age of Onset  . Depression Mother   . Anxiety disorder Mother   . Breast cancer Neg Hx    Substance abuse history: Denies  Social History: Widowed, lives at [facility], employed as Mudlogger at [employer], has supportive  children, enjoys her grandchildren. Social History   Socioeconomic History  . Marital status: Widowed    Spouse name: Not on file  . Number of children: 2  . Years of education: Not on file  . Highest education level: High school graduate  Occupational History    Comment: fulltime  Social Needs  . Financial resource strain: Not hard at all  . Food insecurity:    Worry: Never true    Inability: Never true  . Transportation needs:    Medical: No    Non-medical: No  Tobacco Use  . Smoking status: Former Smoker    Start date: 11/14/1974    Last attempt to quit: 05/14/2014    Years since quitting: 3.7  . Smokeless tobacco: Never Used  Substance and Sexual Activity  . Alcohol use: Yes    Alcohol/week: 0.0 - 0.6 oz    Comment: social rare  . Drug use: No  . Sexual activity: Not Currently  Lifestyle  . Physical activity:    Days per week: 0 days    Minutes per session: 0 min  . Stress: Very much  Relationships  . Social connections:    Talks on phone: Never    Gets together: More than three times a week    Attends religious service: Never    Active member of club or organization: No    Attends meetings of clubs or organizations: Never    Relationship status: Widowed  Other Topics Concern  . Not on file  Social History Narrative  . Not on file    Allergies:  Allergies  Allergen Reactions  . Codeine Nausea Only    Metabolic Disorder Labs: No results found for: HGBA1C, MPG No results found for: PROLACTIN No results found for: CHOL, TRIG, HDL, CHOLHDL, VLDL, LDLCALC No results found for: TSH  Therapeutic Level Labs: No results found for: LITHIUM No results found for: VALPROATE No components found for:  CBMZ  Current Medications: Current Outpatient Medications  Medication Sig Dispense Refill  . atorvastatin (LIPITOR) 10 MG tablet Take 20 mg by mouth daily.    Marland Kitchen atorvastatin (LIPITOR) 20 MG tablet atorvastatin 20 mg tablet    . baclofen (LIORESAL) 10 MG tablet      . buPROPion (WELLBUTRIN XL) 150 MG 24 hr tablet Take 1 tablet (150 mg total) by mouth daily. 90 tablet 1  . celecoxib (CELEBREX) 200 MG capsule     . Cholecalciferol (VITAMIN D3) 1000 units CAPS Take 1 capsule by mouth daily.    . DULoxetine (CYMBALTA) 20 MG capsule Take 1 capsule (20 mg total) by mouth daily. TO BE TAKEN ALONG WITH THE 60 MG 30 capsule 1  . DULoxetine (CYMBALTA) 60 MG capsule Take 1 capsule (60 mg total) by mouth at bedtime. TO BE TAKEN ALONG WITH 20 MG 90 capsule 0  . estradiol (CLIMARA - DOSED IN MG/24 HR) 0.0375 mg/24hr patch     .  fluticasone (FLONASE) 50 MCG/ACT nasal spray fluticasone 50 mcg/actuation nasal spray,suspension    . hydrOXYzine (ATARAX/VISTARIL) 10 MG tablet Take 1-2 tablets (10-20 mg total) by mouth 2 (two) times daily as needed (SEVERE ANXIETY SX). 120 tablet 1  . levothyroxine (SYNTHROID, LEVOTHROID) 150 MCG tablet levothyroxine 150 mcg tablet    . lisinopril-hydrochlorothiazide (PRINZIDE,ZESTORETIC) 10-12.5 MG tablet Take 1 tablet by mouth daily.   2  . pregabalin (LYRICA) 50 MG capsule Take 1 capsule (50 mg total) by mouth at bedtime. 90 capsule 1  . temazepam (RESTORIL) 15 MG capsule Take 1 capsule (15 mg total) by mouth at bedtime as needed for sleep. 30 capsule 2   No current facility-administered medications for this visit.      Musculoskeletal: Strength & Muscle Tone: within normal limits Gait & Station: normal Patient leans: N/A  Psychiatric Specialty Exam: Review of Systems  Psychiatric/Behavioral: The patient is nervous/anxious.   All other systems reviewed and are negative.   Blood pressure 112/74, pulse 99, temperature 97.7 F (36.5 C), temperature source Oral, weight 175 lb 3.2 oz (79.5 kg).Body mass index is 34.22 kg/m.  General Appearance: Casual  Eye Contact:  Fair  Speech:  Normal Rate  Volume:  Normal  Mood:  Anxious  Affect:  Congruent  Thought Process:  Goal Directed and Descriptions of Associations: Intact   Orientation:  Full (Time, Place, and Person)  Thought Content: Logical   Suicidal Thoughts:  No  Homicidal Thoughts:  No  Memory:  Immediate;   Fair Recent;   Fair Remote;   Fair  Judgement:  Fair  Insight:  Fair  Psychomotor Activity:  Normal  Concentration:  Concentration: Fair and Attention Span: Fair  Recall:  [employer] of Knowledge: Fair  Language: Fair  Akathisia:  No  Handed:  Right  AIMS (if indicated): NA  Assets:  Communication Skills Desire for Improvement Housing Social Support  ADL's:  Intact  Cognition: WNL  Sleep:  Fair   Screenings:   Assessment and Plan: Evelyn Bentley is a 62 year old Caucasian female, widowed, has a history of MDD, fibromyalgia, presented to the clinic today for a follow-up visit.  She has a history of psychosocial stressors like several deaths in the family, son died when he was 41 years old due to a neurological disease, another son who had muscular dystrophy died at the age of 23, her husband passed away soon after that and so on.  She continues to have some recent psychosocial stressors which is making her anxiety worse.  She is also dealing with some memory problems.  Discussed with her about medication readjustment, lab work up  as well as scheduling an appointment to see a therapist Ms. Royal Piedra here in clinic.  Plan as noted below.  Plan  Depression Increase Cymbalta to 80 mg p.o. daily. Continue Wellbutrin XL 150 mg p.o. Daily  For panic attacks Increase Cymbalta to 80 mg p.o. daily Add hydroxyzine 10-20 mg p.o. twice daily as needed For CBT with Ms. Royal Piedra.  For memory problems She does have hypothyroidism and had some recent abnormalities.  Her PMD is currently managing it.  She is on a corrected dose of levothyroxine at this time. She also started vitamin D replacement on a weekly basis recently. Will get her vitamin B12 and folate level. Also discussed with her that anxiety symptoms can also cause memory  issues. Discussed with her that medications like Lyrica and temazepam can also cause cognitive changes. Will monitor closely.  Follow-up in  clinic in 1 month or sooner if needed.  More than 50 % of the time was spent for psychoeducation and supportive psychotherapy and care coordination.  This note was generated in part or whole with voice recognition software. Voice recognition is usually quite accurate but there are transcription errors that can and very often do occur. I apologize for any typographical errors that were not detected and corrected.      Ursula Alert, MD 01/31/2018, 1:06 PM

## 2018-01-30 NOTE — Patient Instructions (Signed)
Hydroxyzine capsules or tablets What is this medicine? HYDROXYZINE (hye [place] i zeen) is an antihistamine. This medicine is used to treat allergy symptoms. It is also used to treat anxiety and tension. This medicine can be used with other medicines to induce sleep before surgery. This medicine may be used for other purposes; ask your health care provider or pharmacist if you have questions. COMMON BRAND NAME(S): ANX, Atarax, Rezine, Vistaril What should I tell my health care provider before I take this medicine? They need to know if you have any of these conditions: -any chronic illness -difficulty passing urine -glaucoma -heart disease -kidney disease -liver disease -lung disease -an unusual or allergic reaction to hydroxyzine, cetirizine, other medicines, foods, dyes, or preservatives -pregnant or trying to get pregnant -breast-feeding How should I use this medicine? Take this medicine by mouth with a full glass of water. Follow the directions on the prescription label. You may take this medicine with food or on an empty stomach. Take your medicine at regular intervals. Do not take your medicine more often than directed. Talk to your pediatrician regarding the use of this medicine in children. Special care may be needed. While this drug may be prescribed for children as young as 75 years of age for selected conditions, precautions do apply. Patients over 62 years old may have a stronger reaction and need a smaller dose. Overdosage: If you think you have taken too much of this medicine contact a poison control center or emergency room at once. NOTE: This medicine is only for you. Do not share this medicine with others. What if I miss a dose? If you miss a dose, take it as soon as you can. If it is almost time for your next dose, take only that dose. Do not take double or extra doses. What may interact with this medicine? -alcohol -barbiturate medicines for sleep or seizures -medicines for  colds, allergies -medicines for depression, anxiety, or emotional disturbances -medicines for pain -medicines for sleep -muscle relaxants This list may not describe all possible interactions. Give your health care provider a list of all the medicines, herbs, non-prescription drugs, or dietary supplements you use. Also tell them if you smoke, drink alcohol, or use illegal drugs. Some items may interact with your medicine. What should I watch for while using this medicine? Tell your doctor or health care professional if your symptoms do not improve. You may get drowsy or dizzy. Do not drive, use machinery, or do anything that needs mental alertness until you know how this medicine affects you. Do not stand or sit up quickly, especially if you are an older patient. This reduces the risk of dizzy or fainting spells. Alcohol may interfere with the effect of this medicine. Avoid alcoholic drinks. Your mouth may get dry. Chewing sugarless gum or sucking hard candy, and drinking plenty of water may help. Contact your doctor if the problem does not go away or is severe. This medicine may cause dry eyes and blurred vision. If you wear contact lenses you may feel some discomfort. Lubricating drops may help. See your eye doctor if the problem does not go away or is severe. If you are receiving skin tests for allergies, tell your doctor you are using this medicine. What side effects may I notice from receiving this medicine? Side effects that you should report to your doctor or health care professional as soon as possible: -fast or irregular heartbeat -difficulty passing urine -seizures -slurred speech or confusion -tremor Side effects that  usually do not require medical attention (report to your doctor or health care professional if they continue or are bothersome): -constipation -drowsiness -fatigue -headache -stomach upset This list may not describe all possible side effects. Call your doctor for  medical advice about side effects. You may report side effects to FDA at 1-800-FDA-1088. Where should I keep my medicine? Keep out of the reach of children. Store at room temperature between 15 and 30 degrees C (59 and 86 degrees F). Keep container tightly closed. Throw away any unused medicine after the expiration date. NOTE: This sheet is a summary. It may not cover all possible information. If you have questions about this medicine, talk to your doctor, pharmacist, or health care provider.  2018 Elsevier/Gold Standard (2008-03-12 14:50:59)    PLEASE START TAKING YOUR CYMBALTA EARLY SO THAT YOU WOULD NOT FEEL TOO DROWSY IN THE MORNING.

## 2018-01-31 ENCOUNTER — Encounter: Payer: Self-pay | Admitting: Psychiatry

## 2018-01-31 MED ORDER — TEMAZEPAM 15 MG PO CAPS: 15.0000 mg | ORAL_CAPSULE | Freq: Every evening | ORAL | 2 refills | Status: DC | PRN

## 2018-02-10 ENCOUNTER — Other Ambulatory Visit: Payer: Self-pay | Admitting: Psychiatry

## 2018-02-11 LAB — B12 AND FOLATE PANEL
FOLATE: 15.2 ng/mL (ref >3.0)
Vitamin B-12: 207 pg/mL — ABNORMAL LOW (ref 232–1245)

## 2018-02-12 ENCOUNTER — Telehealth: Payer: Self-pay

## 2018-02-12 NOTE — Telephone Encounter (Signed)
faxed and confirmed copy of labwork.

## 2018-02-12 NOTE — Telephone Encounter (Signed)
left message for pt to call office back in regards of labwork.

## 2018-02-12 NOTE — Telephone Encounter (Signed)
pt called pt was given results and advised to make appt with pcp.  pt was told that pcp was faxed a copy of the labwork and that a copy was also being mailed to her also.

## 2018-02-26 ENCOUNTER — Encounter: Payer: Self-pay | Admitting: Psychiatry

## 2018-02-26 ENCOUNTER — Ambulatory Visit: Payer: 59 | Admitting: Licensed Clinical Social Worker

## 2018-02-26 ENCOUNTER — Ambulatory Visit (INDEPENDENT_AMBULATORY_CARE_PROVIDER_SITE_OTHER): Payer: 59 | Admitting: Psychiatry

## 2018-02-26 ENCOUNTER — Other Ambulatory Visit: Payer: Self-pay

## 2018-02-26 VITALS — BP 101/67 | HR 88 | Temp 98.6°F | Ht 60.0 in | Wt 175.2 lb

## 2018-02-26 DIAGNOSIS — F331 Major depressive disorder, recurrent, moderate: Secondary | ICD-10-CM

## 2018-02-26 DIAGNOSIS — F410 Panic disorder [episodic paroxysmal anxiety] without agoraphobia: Secondary | ICD-10-CM | POA: Diagnosis not present

## 2018-02-26 MED ORDER — DULOXETINE HCL 20 MG PO CPEP: 20.0000 mg | ORAL_CAPSULE | Freq: Every day | ORAL | 1 refills | Status: DC

## 2018-02-26 NOTE — Progress Notes (Signed)
[facility] MD OP Progress Note  02/26/2018 10:07 PM Evelyn Bentley  MRN:  [id number]  Chief Complaint: ' I am here for follow up." Chief Complaint    Follow-up; Medication Refill     HPI: Evelyn Bentley is a 62 year old Caucasian female who is widowed, has a history of MDD, lives in [place], has a history of depression, hypersomnia, presented to the clinic today for a follow-up visit.  Patient today reports she continues to have psychosocial stressors at home.  She reports her son and his wife are currently dealing with relationship issues.  She is also worried about the 3 grand children who are still young.  She reports however that since our last visit she has felt less anxious.  She reports she may be coping better than before.  She has started the higher dose of Cymbalta and currently takes 80 mg daily.  She reports she is tolerating that dose well.  She denies any side effects.  She does report some forgetfulness on and off.  She had her vitamin B12 levels drawn and it came as very low.  Patient has started following up with her PMD for management.  She reports she got one vitamin B12 injections recently and is scheduled for another one soon.  She reports temazepam as helpful for sleep.  She denies any other concerns today.  Her work continues to be going well. Visit Diagnosis:    ICD-10-CM   1. MDD (major depressive disorder), recurrent episode, moderate (HCC) F33.1 DULoxetine (CYMBALTA) 20 MG capsule  2. Panic attack F41.0 DULoxetine (CYMBALTA) 20 MG capsule    Past Psychiatric History: Patient used to follow up with Dr. Gretel Acre in the past.  Previous diagnosis of MDD  Past Medical History:  Past Medical History:  Diagnosis Date  . Anxiety   . Arthritis   . Bursitis    Rt hip  . Complication of anesthesia    nausea  . Depression   . Fibromyalgia   . GERD (gastroesophageal reflux disease)   . Hypercholesteremia   . Hypertension   . Hypothyroidism   . Thyroid disease     Past  Surgical History:  Procedure Laterality Date  . ABDOMINAL HYSTERECTOMY    . CHOLECYSTECTOMY    . COLONOSCOPY WITH PROPOFOL N/A 11/13/2016   Procedure: COLONOSCOPY WITH PROPOFOL;  Surgeon: Lollie Sails, MD;  Location: [facility] ENDOSCOPY;  Service: Endoscopy;  Laterality: N/A;  . ESOPHAGOGASTRODUODENOSCOPY (EGD) WITH PROPOFOL N/A 11/13/2016   Procedure: ESOPHAGOGASTRODUODENOSCOPY (EGD) WITH PROPOFOL;  Surgeon: Lollie Sails, MD;  Location: [facility] ENDOSCOPY;  Service: Endoscopy;  Laterality: N/A;  . LAPAROSCOPIC HYSTERECTOMY Bilateral 02/28/2016   Procedure: HYSTERECTOMY TOTAL LAPAROSCOPIC / BSO;  Surgeon: Honor Loh Ward, MD;  Location: [facility] ORS;  Service: Gynecology;  Laterality: Bilateral;  . NECK SURGERY    . NISSEN FUNDOPLICATION    . TUBAL LIGATION    . URETEROSCOPY WITH HOLMIUM LASER LITHOTRIPSY Left 07/03/2016   Procedure: URETEROSCOPY WITH HOLMIUM LASER LITHOTRIPSY;  Surgeon: Royston Cowper, MD;  Location: [facility] ORS;  Service: Urology;  Laterality: Left;    Family Psychiatric History: Mother with mental health issues .  Family History:  Family History  Problem Relation Age of Onset  . Depression Mother   . Anxiety disorder Mother   . 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  She enjoys her grandchildren.  Social History   Socioeconomic History  . Marital status: Widowed    Spouse name: Not on file  . Number of children: 2  . Years of education: Not on file  . Highest education level: High school graduate  Occupational History    Comment: fulltime  Social Needs  . Financial resource strain: Not hard at all  . Food insecurity:    Worry: Never true    Inability: Never true  . Transportation needs:    Medical: No    Non-medical: No  Tobacco Use  . Smoking status: Former Smoker    Start date: 11/14/1974    Last attempt to quit:  05/14/2014    Years since quitting: 3.7  . Smokeless tobacco: Never Used  Substance and Sexual Activity  . Alcohol use: Yes    Alcohol/week: 0.0 - 0.6 oz    Comment: social rare  . Drug use: No  . Sexual activity: Not Currently  Lifestyle  . Physical activity:    Days per week: 0 days    Minutes per session: 0 min  . Stress: Very much  Relationships  . Social connections:    Talks on phone: Never    Gets together: More than three times a week    Attends religious service: Never    Active member of club or organization: No    Attends meetings of clubs or organizations: Never    Relationship status: Widowed  Other Topics Concern  . Not on file  Social History Narrative  . Not on file    Allergies:  Allergies  Allergen Reactions  . Codeine Nausea Only    Metabolic Disorder Labs: No results found for: HGBA1C, MPG No results found for: PROLACTIN No results found for: CHOL, TRIG, HDL, CHOLHDL, VLDL, LDLCALC No results found for: TSH  Therapeutic Level Labs: No results found for: LITHIUM No results found for: VALPROATE No components found for:  CBMZ  Current Medications: Current Outpatient Medications  Medication Sig Dispense Refill  . atorvastatin (LIPITOR) 10 MG tablet Take 20 mg by mouth daily.    Marland Kitchen atorvastatin (LIPITOR) 20 MG tablet atorvastatin 20 mg tablet    . baclofen (LIORESAL) 10 MG tablet     . buPROPion (WELLBUTRIN XL) 150 MG 24 hr tablet Take 1 tablet (150 mg total) by mouth daily. 90 tablet 1  . celecoxib (CELEBREX) 200 MG capsule     . Cholecalciferol (VITAMIN D3) 1000 units CAPS Take 1 capsule by mouth daily.    . DULoxetine (CYMBALTA) 20 MG capsule Take 1 capsule (20 mg total) by mouth daily. TO BE TAKEN ALONG WITH THE 60 MG 90 capsule 1  . DULoxetine (CYMBALTA) 60 MG capsule Take 1 capsule (60 mg total) by mouth at bedtime. TO BE TAKEN ALONG WITH 20 MG 90 capsule 0  . estradiol (CLIMARA - DOSED IN MG/24 HR) 0.0375 mg/24hr patch     . fluticasone  (FLONASE) 50 MCG/ACT nasal spray fluticasone 50 mcg/actuation nasal spray,suspension    . hydrOXYzine (ATARAX/VISTARIL) 10 MG tablet Take 1-2 tablets (10-20 mg total) by mouth 2 (two) times daily as needed (SEVERE ANXIETY SX). 120 tablet 1  . levothyroxine (SYNTHROID, LEVOTHROID) 150 MCG tablet levothyroxine 150 mcg tablet    . lisinopril-hydrochlorothiazide (PRINZIDE,ZESTORETIC) 10-12.5 MG tablet Take 1 tablet by mouth daily.   2  . pregabalin (LYRICA) 50 MG capsule Take 1 capsule (50 mg total) by mouth at bedtime. 90 capsule 1  . temazepam (RESTORIL) 15 MG capsule Take 1 capsule (  15 mg total) by mouth at bedtime as needed for sleep. 30 capsule 2   No current facility-administered medications for this visit.      Musculoskeletal: Strength & Muscle Tone: within normal limits Gait & Station: normal Patient leans: N/A  Psychiatric Specialty Exam: Review of Systems  Psychiatric/Behavioral: Positive for depression. The patient is nervous/anxious.   All other systems reviewed and are negative.   Blood pressure 101/67, pulse 88, temperature 98.6 F (37 C), temperature source Oral, height 5' (1.524 m), weight 175 lb 3.2 oz (79.5 kg).Body mass index is 34.22 kg/m.  General Appearance: Casual  Eye Contact:  Fair  Speech:  Normal Rate  Volume:  Normal  Mood:  Anxious and Dysphoric  Affect:  Congruent  Thought Process:  Goal Directed and Descriptions of Associations: Intact  Orientation:  Full (Time, Place, and Person)  Thought Content: Logical   Suicidal Thoughts:  No  Homicidal Thoughts:  No  Memory:  Immediate;   Fair Recent;   Fair Remote;   Fair  Judgement:  Fair  Insight:  Fair  Psychomotor Activity:  Normal  Concentration:  Concentration: Fair and Attention Span: Fair  Recall:  [employer] of Knowledge: Fair  Language: Fair  Akathisia:  No  Handed:  Right  AIMS (if indicated): na  Assets:  Communication Skills Desire for Improvement Housing Social  Support Talents/Skills Transportation Vocational/Educational  ADL's:  Intact  Cognition: WNL  Sleep:  Fair   Screenings:   Assessment and Plan: Yaniah is a 62 year old Caucasian female, widowed, has a history of MDD, fibromyalgia, presented to the clinic today for a follow-up visit.  She has several psychosocial stressors including several deaths in the family, son died when he was 36 years old due to neurological problems, another son who had muscular dystrophy died at the age of 5, her husband passed away soon after that.  She also has some recent psychosocial stressors of relationship issues between her son and his wife.  Patient however is coping better than before.  She will see Ms. Peacock for psychotherapy today.  She is tolerating the medication readjustment well.  Plan as noted below.  Plan For depression Continue Cymbalta 80 mg p.o. daily Continue Wellbutrin XL 150 mg p.o. Daily.  Panic attacks Continue Cymbalta 80 mg p.o. daily Add hydroxyzine 10-20 mg p.o. twice daily as needed She will see Ms. Royal Piedra for CBT today.  For memory problems She does have hypothyroidism and had some recent abnormalities.  She also was found to have vitamin B12 and vitamin D deficiency.  We will continue management with her PMD. Also discussed medications that she is on like Lyrica and temazepam could also potentially contribute to her cognitive changes. We will continue to monitor closely.  Follow-up in clinic in 1 month or sooner if needed.  More than 50 % of the time was spent for psychoeducation and supportive psychotherapy and care coordination.  This note was generated in part or whole with voice recognition software. Voice recognition is usually quite accurate but there are transcription errors that can and very often do occur. I apologize for any typographical errors that were not detected and corrected.       Ursula Alert, MD 02/26/2018, 10:07 PM

## 2018-03-17 NOTE — Progress Notes (Signed)
   THERAPIST PROGRESS NOTE  Session Time: 1 hour  Participation Level: Active  Behavioral Response: CasualAlertEuthymic  Type of Therapy: Individual Therapy  Treatment Goals addressed: Coping  Interventions: CBT and Motivational Interviewing  Summary: Evelyn Bentley is a 62 y.o. female who presents with continued symptoms of her diagnosis.  Patient reports that she has been having a difficult time with her son moving out.  SHe reports that while her son was living in the home she was frustrated and upset with him but since he has moved she misses him and wants to visit with him almost daily.  She reports that she continues to work daily and feel stressed out with her co workers.  Explored hobbies that she enjoys and being able to engage with friends and family.  She reports that her daughter still lives in the home and would benefit from [facility].  Patient was encouraged to journal and attend sessions more frequently   Suicidal/Homicidal: No  Plan: Return again in 2weeks.  Diagnosis: Axis I: Depression    Axis II: No diagnosis    Lubertha South, LCSW 02/26/2018

## 2018-03-25 ENCOUNTER — Other Ambulatory Visit: Payer: Self-pay | Admitting: Psychiatry

## 2018-03-25 DIAGNOSIS — F410 Panic disorder [episodic paroxysmal anxiety] without agoraphobia: Secondary | ICD-10-CM

## 2018-03-25 DIAGNOSIS — F331 Major depressive disorder, recurrent, moderate: Secondary | ICD-10-CM

## 2018-04-09 ENCOUNTER — Ambulatory Visit: Payer: 59 | Admitting: Psychiatry

## 2018-04-09 ENCOUNTER — Encounter: Payer: Self-pay | Admitting: Psychiatry

## 2018-04-09 ENCOUNTER — Other Ambulatory Visit: Payer: Self-pay

## 2018-04-09 ENCOUNTER — Ambulatory Visit (INDEPENDENT_AMBULATORY_CARE_PROVIDER_SITE_OTHER): Payer: 59 | Admitting: Licensed Clinical Social Worker

## 2018-04-09 VITALS — BP 124/75 | HR 89 | Temp 97.6°F | Wt 175.0 lb

## 2018-04-09 DIAGNOSIS — F410 Panic disorder [episodic paroxysmal anxiety] without agoraphobia: Secondary | ICD-10-CM

## 2018-04-09 DIAGNOSIS — F331 Major depressive disorder, recurrent, moderate: Secondary | ICD-10-CM

## 2018-04-09 MED ORDER — TEMAZEPAM 22.5 MG PO CAPS: 22.5000 mg | ORAL_CAPSULE | Freq: Every evening | ORAL | 2 refills | Status: DC | PRN

## 2018-04-09 MED ORDER — BUPROPION HCL ER (XL) 300 MG PO TB24: 300.0000 mg | ORAL_TABLET | Freq: Every day | ORAL | 1 refills | Status: DC

## 2018-04-09 MED ORDER — DULOXETINE HCL 60 MG PO CPEP: 60.0000 mg | ORAL_CAPSULE | Freq: Every day | ORAL | 1 refills | Status: DC

## 2018-04-09 NOTE — Progress Notes (Signed)
[name] MD OP Progress Note  04/09/2018 5:38 PM Evelyn Bentley  MRN:  [id number]  Chief Complaint: ' I am here for follow up." Chief Complaint    Follow-up; Medication Refill     HPI: Evelyn Bentley is a 62 year old Caucasian female who is widowed, has a history of MDD, lives in [place], has a history of depression, hypersomnia, presented to the clinic today for a follow-up visit.  Patient today reports as feeling depressed.  She reports she feels lack of motivation, lethargy, sadness and sleep issues.  She reports she feels tired and does not want to keep up with water but she is doing now.  She reports she is taking up a lot of responsibility and is tired of doing that.  Patient reports several psychosocial stressors.  She reports her daughter who lives with her has a medical disability however is unable to get SSD.  Patient reports her daughter is always in pain due to the medical issues.  Patient reports since they could not take her to the doctor and had documentations about what she is going through her disability got denied.  Patient reports that has been very stressful for her.  Patient reports she is tolerating the medication, Cymbalta as well as Wellbutrin.  Discussed with her that the Wellbutrin can be readjusted.  She agrees with plan.  Patient also has trouble sleeping.  She does not think the temazepam is helpful as it used to be before.  Discussed with her about readjusting the dose and she agrees with plan.  Patient is scheduled to see Evelyn Bentley for psychotherapy today.  She will continue the same on a more frequent basis if possible.  She will discuss this with Evelyn Bentley. Visit Diagnosis:    ICD-10-CM   1. MDD (major depressive disorder), recurrent episode, moderate (HCC) F33.1 DULoxetine (CYMBALTA) 60 MG capsule  2. Panic attack F41.0     Past Psychiatric History: I have reviewed past psychiatric history from my progress note on 02/26/2018  Past Medical History:  Past  Medical History:  Diagnosis Date  . Anxiety   . Arthritis   . Bursitis    Rt hip  . Complication of anesthesia    nausea  . Depression   . Fibromyalgia   . GERD (gastroesophageal reflux disease)   . Hypercholesteremia   . Hypertension   . Hypothyroidism   . Thyroid disease     Past Surgical History:  Procedure Laterality Date  . ABDOMINAL HYSTERECTOMY    . CHOLECYSTECTOMY    . COLONOSCOPY WITH PROPOFOL N/A 11/13/2016   Procedure: COLONOSCOPY WITH PROPOFOL;  Surgeon: Lollie Sails, MD;  Location: [facility] ENDOSCOPY;  Service: Endoscopy;  Laterality: N/A;  . ESOPHAGOGASTRODUODENOSCOPY (EGD) WITH PROPOFOL N/A 11/13/2016   Procedure: ESOPHAGOGASTRODUODENOSCOPY (EGD) WITH PROPOFOL;  Surgeon: Lollie Sails, MD;  Location: [facility] ENDOSCOPY;  Service: Endoscopy;  Laterality: N/A;  . LAPAROSCOPIC HYSTERECTOMY Bilateral 02/28/2016   Procedure: HYSTERECTOMY TOTAL LAPAROSCOPIC / BSO;  Surgeon: Honor Loh Ward, MD;  Location: [facility] ORS;  Service: Gynecology;  Laterality: Bilateral;  . NECK SURGERY    . NISSEN FUNDOPLICATION    . TUBAL LIGATION    . URETEROSCOPY WITH HOLMIUM LASER LITHOTRIPSY Left 07/03/2016   Procedure: URETEROSCOPY WITH HOLMIUM LASER LITHOTRIPSY;  Surgeon: Royston Cowper, MD;  Location: [facility] ORS;  Service: Urology;  Laterality: Left;    Family Psychiatric History: Reviewed family psychiatric history from my progress note on 02/26/2018  Family History:  Family History  Problem  Relation Age of Onset  . Depression Mother   . Anxiety disorder Mother   . Breast cancer Neg Hx     Social History: She is widowed, lives at [facility], employed as Mudlogger at [employer].  Patient has a daughter who lives with her who has medical problems- Evelyn Bentley.  Patient has a son who lives with his wife.  Patient enjoys her grandchildren. Social History   Socioeconomic History  . Marital status: Widowed    Spouse name: Not on file  . Number of children: 2  .  Years of education: Not on file  . Highest education level: High school graduate  Occupational History    Comment: fulltime  Social Needs  . Financial resource strain: Not hard at all  . Food insecurity:    Worry: Never true    Inability: Never true  . Transportation needs:    Medical: No    Non-medical: No  Tobacco Use  . Smoking status: Former Smoker    Start date: 11/14/1974    Last attempt to quit: 05/14/2014    Years since quitting: 3.9  . Smokeless tobacco: Never Used  Substance and Sexual Activity  . Alcohol use: Yes    Alcohol/week: 0.0 - 0.6 oz    Comment: social rare  . Drug use: No  . Sexual activity: Not Currently  Lifestyle  . Physical activity:    Days per week: 0 days    Minutes per session: 0 min  . Stress: Very much  Relationships  . Social connections:    Talks on phone: Never    Gets together: More than three times a week    Attends religious service: Never    Active member of club or organization: No    Attends meetings of clubs or organizations: Never    Relationship status: Widowed  Other Topics Concern  . Not on file  Social History Narrative  . Not on file    Allergies:  Allergies  Allergen Reactions  . Codeine Nausea Only    Metabolic Disorder Labs: No results found for: HGBA1C, MPG No results found for: PROLACTIN No results found for: CHOL, TRIG, HDL, CHOLHDL, VLDL, LDLCALC No results found for: TSH  Therapeutic Level Labs: No results found for: LITHIUM No results found for: VALPROATE No components found for:  CBMZ  Current Medications: Current Outpatient Medications  Medication Sig Dispense Refill  . atorvastatin (LIPITOR) 10 MG tablet Take 20 mg by mouth daily.    Marland Kitchen atorvastatin (LIPITOR) 20 MG tablet atorvastatin 20 mg tablet    . baclofen (LIORESAL) 10 MG tablet     . celecoxib (CELEBREX) 200 MG capsule     . Cholecalciferol (VITAMIN D3) 1000 units CAPS Take 1 capsule by mouth daily.    . DULoxetine (CYMBALTA) 20 MG capsule  Take 1 capsule (20 mg total) by mouth daily. TO BE TAKEN ALONG WITH THE 60 MG 90 capsule 1  . DULoxetine (CYMBALTA) 60 MG capsule Take 1 capsule (60 mg total) by mouth at bedtime. TO BE TAKEN ALONG WITH 20 MG 90 capsule 1  . estradiol (CLIMARA - DOSED IN MG/24 HR) 0.0375 mg/24hr patch     . fluticasone (FLONASE) 50 MCG/ACT nasal spray fluticasone 50 mcg/actuation nasal spray,suspension    . hydrOXYzine (ATARAX/VISTARIL) 10 MG tablet TAKE 1-2 TABLETS (10-20 MG TOTAL) BY MOUTH 2 (TWO) TIMES DAILY AS NEEDED (SEVERE ANXIETY SX). 120 tablet 1  . levothyroxine (SYNTHROID, LEVOTHROID) 150 MCG tablet levothyroxine  150 mcg tablet    . lisinopril-hydrochlorothiazide (PRINZIDE,ZESTORETIC) 10-12.5 MG tablet Take 1 tablet by mouth daily.   2  . pregabalin (LYRICA) 50 MG capsule Take 1 capsule (50 mg total) by mouth at bedtime. 90 capsule 1  . buPROPion (WELLBUTRIN XL) 300 MG 24 hr tablet Take 1 tablet (300 mg total) by mouth daily. 90 tablet 1  . temazepam (RESTORIL) 22.5 MG capsule Take 1 capsule (22.5 mg total) by mouth at bedtime as needed for sleep. 30 capsule 2   No current facility-administered medications for this visit.      Musculoskeletal: Strength & Muscle Tone: within normal limits Gait & Station: normal Patient leans: N/A  Psychiatric Specialty Exam: Review of Systems  Psychiatric/Behavioral: Positive for depression. The patient is nervous/anxious and has insomnia.   All other systems reviewed and are negative.   Blood pressure 124/75, pulse 89, temperature 97.6 F (36.4 C), temperature source Oral, weight 175 lb (79.4 kg).Body mass index is 34.18 kg/m.  General Appearance: Casual  Eye Contact:  Fair  Speech:  Normal Rate  Volume:  Normal  Mood:  Depressed and Dysphoric  Affect:  Tearful  Thought Process:  Goal Directed and Descriptions of Associations: Intact  Orientation:  Full (Time, Place, and Person)  Thought Content: Logical   Suicidal Thoughts:  No  Homicidal Thoughts:   No  Memory:  Immediate;   Fair Recent;   Fair Remote;   Fair  Judgement:  Fair  Insight:  Fair  Psychomotor Activity:  Normal  Concentration:  Concentration: Fair and Attention Span: Fair  Recall:  [employer] of Knowledge: Fair  Language: Fair  Akathisia:  No  Handed:  Right  AIMS (if indicated): na  Assets:  Communication Skills Desire for Improvement Social Support  ADL's:  Intact  Cognition: WNL  Sleep:  Poor   Screenings:   Assessment and Plan: Renda is a 62 year old Caucasian female, widowed, has a history of MDD, fibromyalgia, presented to the clinic today for a follow-up visit.  Patient has several psychosocial stressors including several deaths in the family, son died when he was 89 years old due to neurological problems, and is a son who had muscular dystrophy died at the age of 41, her husband passed away soon after that.  Patient currently also has psychosocial stressor of his daughter who was unable to get disability inspite of having medical issues and being in pain all the time.  Patient is currently very depressed and has sleep issues.  Hence discussed medication readjustment with patient.  She will see Evelyn Bentley for psychotherapy today plan as noted below.  Plan For depression Continue Cymbalta 80 mg p.o. daily Increase Wellbutrin XL to 300 mg p.o. daily   For panic attacks Continue Cymbalta 80 mg p.o. daily Continue hydroxyzine 10-20 mg p.o. twice daily as needed She will continue psychotherapy with Evelyn Bentley  For memory problems Patient is currently on vitamin B12 and vitamin D deficiency management.    Insomnia Increase temazepam to 22.5 mg p.o. nightly  Follow-up in clinic in 3-4 weeks or sooner if needed.  More than 50 % of the time was spent for psychoeducation and supportive psychotherapy and care coordination.  This note was generated in part or whole with voice recognition software. Voice recognition is usually quite accurate but there are  transcription errors that can and very often do occur. I apologize for any typographical errors that were not detected and corrected.       Norah Devin,  MD 04/10/2018, 9:56 AM

## 2018-04-10 ENCOUNTER — Encounter: Payer: Self-pay | Admitting: Psychiatry

## 2018-04-23 ENCOUNTER — Encounter: Payer: Self-pay | Admitting: Psychiatry

## 2018-04-23 ENCOUNTER — Other Ambulatory Visit: Payer: Self-pay

## 2018-04-23 ENCOUNTER — Ambulatory Visit (INDEPENDENT_AMBULATORY_CARE_PROVIDER_SITE_OTHER): Payer: 59 | Admitting: Psychiatry

## 2018-04-23 VITALS — BP 101/64 | HR 75 | Temp 97.6°F

## 2018-04-23 DIAGNOSIS — F410 Panic disorder [episodic paroxysmal anxiety] without agoraphobia: Secondary | ICD-10-CM | POA: Diagnosis not present

## 2018-04-23 DIAGNOSIS — F331 Major depressive disorder, recurrent, moderate: Secondary | ICD-10-CM | POA: Diagnosis not present

## 2018-04-23 NOTE — Progress Notes (Signed)
[place] MD OP Progress Note  04/23/2018 4:32 PM Evelyn Bentley  MRN:  [id number]  Chief Complaint:  ' I am here for follow up.' Chief Complaint    Follow-up; Medication Refill     HPI: Evelyn Bentley is a 62 year old Caucasian female who is widowed, has a history of MDD, lives in [place], has a history of depression, hypersomnia, presented to the clinic today for a follow-up visit.  She today reports she feels better than before.  She reports she is not crying as much and does not have significant sadness like she used to before.  She reports she is able to do her work better than before.  She reports she is tolerating the higher dose of Wellbutrin well.  She denies any irritability or anxiety symptoms.  Patient reports sleep is better on the temazepam.  Patient continues to have psychosocial stressors of her daughter's health issues and financial problems.  She reports she is trying to work on that.  Patient reports she continues to have on and off memory problems.  She reports there are times when she has to stop herself and think about that she is doing the right thing or not.  She however denies any significant problems like inability to take care of her finances are getting lost while driving or difficulty cooking or difficulty with her job related activities and so on.  Discussed with patient to keep track of her symptoms and let writer know.  Patient denies any suicidality.  Patient denies any perceptual disturbances.  Visit Diagnosis:    ICD-10-CM   1. MDD (major depressive disorder), recurrent episode, moderate (HCC) F33.1   2. Panic attack F41.0     Past Psychiatric History: Reviewed past psychiatric history from my progress note on 02/26/2018.  Past Medical History:  Past Medical History:  Diagnosis Date  . Anxiety   . Arthritis   . Bursitis    Rt hip  . Complication of anesthesia    nausea  . Depression   . Fibromyalgia   . GERD (gastroesophageal reflux disease)   .  Hypercholesteremia   . Hypertension   . Hypothyroidism   . Thyroid disease     Past Surgical History:  Procedure Laterality Date  . ABDOMINAL HYSTERECTOMY    . CHOLECYSTECTOMY    . COLONOSCOPY WITH PROPOFOL N/A 11/13/2016   Procedure: COLONOSCOPY WITH PROPOFOL;  Surgeon: Lollie Sails, MD;  Location: [facility] ENDOSCOPY;  Service: Endoscopy;  Laterality: N/A;  . ESOPHAGOGASTRODUODENOSCOPY (EGD) WITH PROPOFOL N/A 11/13/2016   Procedure: ESOPHAGOGASTRODUODENOSCOPY (EGD) WITH PROPOFOL;  Surgeon: Lollie Sails, MD;  Location: [facility] ENDOSCOPY;  Service: Endoscopy;  Laterality: N/A;  . LAPAROSCOPIC HYSTERECTOMY Bilateral 02/28/2016   Procedure: HYSTERECTOMY TOTAL LAPAROSCOPIC / BSO;  Surgeon: Honor Loh Ward, MD;  Location: [facility] ORS;  Service: Gynecology;  Laterality: Bilateral;  . NECK SURGERY    . NISSEN FUNDOPLICATION    . TUBAL LIGATION    . URETEROSCOPY WITH HOLMIUM LASER LITHOTRIPSY Left 07/03/2016   Procedure: URETEROSCOPY WITH HOLMIUM LASER LITHOTRIPSY;  Surgeon: Royston Cowper, MD;  Location: [facility] ORS;  Service: Urology;  Laterality: Left;    Family Psychiatric History: Reviewed family psychiatric history from my progress note on 02/26/2018  Family History:  Family History  Problem Relation Age of Onset  . Depression Mother   . Anxiety disorder Mother   . Breast cancer Neg Hx    Substance abuse history: Denies  Social History: Widowed, lives at [facility], employed as Mudlogger  at [employer].  Patient has a daughter who lives with her who has medical problems-Arnold Chiari malformation.  Patient has a son who lives with his wife.  Patient enjoys her grandchildren. Social History   Socioeconomic History  . Marital status: Widowed    Spouse name: Not on file  . Number of children: 2  . Years of education: Not on file  . Highest education level: High school graduate  Occupational History    Comment: fulltime  Social Needs  . Financial resource strain: Not hard at  all  . Food insecurity:    Worry: Never true    Inability: Never true  . Transportation needs:    Medical: No    Non-medical: No  Tobacco Use  . Smoking status: Former Smoker    Start date: 11/14/1974    Last attempt to quit: 05/14/2014    Years since quitting: 3.9  . Smokeless tobacco: Never Used  Substance and Sexual Activity  . Alcohol use: Yes    Alcohol/week: 0.0 - 0.6 oz    Comment: social rare  . Drug use: No  . Sexual activity: Not Currently  Lifestyle  . Physical activity:    Days per week: 0 days    Minutes per session: 0 min  . Stress: Very much  Relationships  . Social connections:    Talks on phone: Never    Gets together: More than three times a week    Attends religious service: Never    Active member of club or organization: No    Attends meetings of clubs or organizations: Never    Relationship status: Widowed  Other Topics Concern  . Not on file  Social History Narrative  . Not on file    Allergies:  Allergies  Allergen Reactions  . Codeine Nausea Only    Metabolic Disorder Labs: No results found for: HGBA1C, MPG No results found for: PROLACTIN No results found for: CHOL, TRIG, HDL, CHOLHDL, VLDL, LDLCALC No results found for: TSH  Therapeutic Level Labs: No results found for: LITHIUM No results found for: VALPROATE No components found for:  CBMZ  Current Medications: Current Outpatient Medications  Medication Sig Dispense Refill  . atorvastatin (LIPITOR) 10 MG tablet Take 20 mg by mouth daily.    Marland Kitchen atorvastatin (LIPITOR) 20 MG tablet atorvastatin 20 mg tablet    . baclofen (LIORESAL) 10 MG tablet     . buPROPion (WELLBUTRIN XL) 300 MG 24 hr tablet Take 1 tablet (300 mg total) by mouth daily. 90 tablet 1  . celecoxib (CELEBREX) 200 MG capsule     . Cholecalciferol (VITAMIN D3) 1000 units CAPS Take 1 capsule by mouth daily.    . cloNIDine (CATAPRES) 0.1 MG tablet Take 0.1 mg by mouth at bedtime.  4  . DULoxetine (CYMBALTA) 20 MG capsule  Take 1 capsule (20 mg total) by mouth daily. TO BE TAKEN ALONG WITH THE 60 MG 90 capsule 1  . DULoxetine (CYMBALTA) 60 MG capsule Take 1 capsule (60 mg total) by mouth at bedtime. TO BE TAKEN ALONG WITH 20 MG 90 capsule 1  . estradiol (CLIMARA - DOSED IN MG/24 HR) 0.0375 mg/24hr patch     . fluticasone (FLONASE) 50 MCG/ACT nasal spray fluticasone 50 mcg/actuation nasal spray,suspension    . hydrOXYzine (ATARAX/VISTARIL) 10 MG tablet TAKE 1-2 TABLETS (10-20 MG TOTAL) BY MOUTH 2 (TWO) TIMES DAILY AS NEEDED (SEVERE ANXIETY SX). 120 tablet 1  . levothyroxine (SYNTHROID, LEVOTHROID) 150 MCG tablet levothyroxine 150  mcg tablet    . lisinopril-hydrochlorothiazide (PRINZIDE,ZESTORETIC) 10-12.5 MG tablet Take 1 tablet by mouth daily.   2  . pregabalin (LYRICA) 50 MG capsule Take 1 capsule (50 mg total) by mouth at bedtime. 90 capsule 1  . temazepam (RESTORIL) 22.5 MG capsule Take 1 capsule (22.5 mg total) by mouth at bedtime as needed for sleep. 30 capsule 2   No current facility-administered medications for this visit.      Musculoskeletal: Strength & Muscle Tone: within normal limits Gait & Station: normal Patient leans: N/A  Psychiatric Specialty Exam: Review of Systems  Psychiatric/Behavioral: The patient is nervous/anxious.   All other systems reviewed and are negative.   Blood pressure 101/64, pulse 75, temperature 97.6 F (36.4 C), temperature source Oral.There is no height or weight on file to calculate BMI.  General Appearance: Casual  Eye Contact:  Fair  Speech:  Normal Rate  Volume:  Normal  Mood:  Dysphoric  Affect:  Congruent  Thought Process:  Goal Directed and Descriptions of Associations: Intact  Orientation:  Full (Time, Place, and Person)  Thought Content: Logical   Suicidal Thoughts:  No  Homicidal Thoughts:  No  Memory:  Immediate;   Fair Recent;   Fair Remote;   Fair  Judgement:  Fair  Insight:  Fair  Psychomotor Activity:  Normal  Concentration:  Concentration:  Fair and Attention Span: Fair  Recall:  [employer] of Knowledge: Fair  Language: Fair  Akathisia:  No  Handed:  Right  AIMS (if indicated): na  Assets:  Communication Skills Desire for Improvement Housing Social Support  ADL's:  Intact  Cognition: WNL  Sleep:  Fair   Screenings:   Assessment and Plan: Evelyn Bentley is a 62 year old Caucasian female, widowed, has a history of MDD, fibromyalgia, presented to the clinic today for a follow-up visit.  Patient has several psychosocial stressors including several deaths in the family, son died when he was 50 years old due to neurological problems, had a son who had muscular dystrophy who died at the age of 20, her husband passed away soon after that.  Patient also has a daughter who struggles with her own health issues.  Patient also has financial problems.  Patient is tolerating the medication readjustment well even though she continues to struggle with some memory issues.  She will continue psychotherapy with Ms. Peacock.  Continue plan as noted below.  Plan For depression Continue Cymbalta 80 mg p.o. daily Continue Wellbutrin XL 300 mg p.o. daily  For panic attacks Continue Cymbalta 80 mg p.o. daily Continue hydroxyzine 10-20 mg p.o. twice daily as needed She will continue psychotherapy with Ms. Peacock  For insomnia Temazepam 22.5 mg p.o. nightly.  Discussed with her the risk of being on medications like temazepam including cognitive changes.  For memory problems Patient is currently on vitamin B12 and vitamin D deficiency management. Discussed with her that her memory issues may be due to her medications including medications like temazepam, hydroxyzine or due to her depression ( pseudodementia) .  Discussed with her that if her symptoms worsen she can be referred for neurology evaluation.  Follow-up in clinic in 8 weeks. She will see Ms. Peacock here in clinic in the meantime  More than 50 % of the time was spent for psychoeducation and  supportive psychotherapy and care coordination.  This note was generated in part or whole with voice recognition software. Voice recognition is usually quite accurate but there are transcription errors that can and very often  do occur. I apologize for any typographical errors that were not detected and corrected.     Ursula Alert, MD 04/24/2018, 12:44 PM

## 2018-04-24 ENCOUNTER — Encounter: Payer: Self-pay | Admitting: Psychiatry

## 2018-05-13 NOTE — Progress Notes (Signed)
   THERAPIST PROGRESS NOTE  Session Time: 64mn  Participation Level: Active  Behavioral Response: CasualAlertEuthymic  Type of Therapy: Individual Therapy  Treatment Goals addressed: Anxiety  Interventions: CBT and Motivational Interviewing  Summary: RLAROSE BATRESis a 62y.o. female who presents with symptoms of her diagnosis.  Therapist met with Patient in an outpatient setting to assess current mood and assist with making progress towards goals through the use of therapeutic intervention. Therapist did a brief mood check, assessing anger, fear, disgust, excitement, happiness, and sadness.  Patient reports "sad"mood.  SHe reports that she struggles with being sad and feeling lonely.  SHe reports that she only goes to work and to bed.  Discussion of depression and what it is.  Explored triggers and social skills.  Allowed Patient time to list coping skills that have worked in the past and her apprehension on using them again.  Reminded patient that she is not the only person with depression and that she can work through it.    Suicidal/Homicidal: No  Plan: Return again in 2 weeks.  Diagnosis: Axis I: Depression    Axis II: No diagnosis    NLubertha South LCSW 04/09/2018

## 2018-05-21 ENCOUNTER — Ambulatory Visit: Payer: 59 | Admitting: Licensed Clinical Social Worker

## 2018-05-26 ENCOUNTER — Other Ambulatory Visit: Payer: Self-pay | Admitting: Psychiatry

## 2018-05-26 DIAGNOSIS — F410 Panic disorder [episodic paroxysmal anxiety] without agoraphobia: Secondary | ICD-10-CM

## 2018-05-26 DIAGNOSIS — F331 Major depressive disorder, recurrent, moderate: Secondary | ICD-10-CM

## 2018-06-04 NOTE — Telephone Encounter (Signed)
received a fax requesting a refill on the hydroxyzine. Last seen on 04-23-18 next appt  06-17-18   hydrOXYzine (ATARAX/VISTARIL) 10 MG tablet  Medication  Date: 03/25/2018 Department: [facility] Ordering/Authorizing: Ursula Alert, MD  Order Providers   Prescribing Provider Encounter Provider  Ursula Alert, MD Ursula Alert, MD  Outpatient Medication Detail    Disp Refills Start End   hydrOXYzine (ATARAX/VISTARIL) 10 MG tablet 120 tablet 1 03/25/2018    Sig - Route: TAKE 1-2 TABLETS (10-20 MG TOTAL) BY MOUTH 2 (TWO) TIMES DAILY AS NEEDED (SEVERE ANXIETY SX). - Oral   Sent to pharmacy as: hydrOXYzine (ATARAX/VISTARIL) 10 MG tablet   E-Prescribing Status: Receipt confirmed by pharmacy (03/25/2018 12:24 PM EDT)

## 2018-06-17 ENCOUNTER — Other Ambulatory Visit: Payer: Self-pay

## 2018-06-17 ENCOUNTER — Ambulatory Visit: Payer: 59 | Admitting: Psychiatry

## 2018-06-17 ENCOUNTER — Encounter: Payer: Self-pay | Admitting: Psychiatry

## 2018-06-17 VITALS — BP 123/79 | HR 85 | Temp 98.0°F | Wt 172.8 lb

## 2018-06-17 DIAGNOSIS — F5105 Insomnia due to other mental disorder: Secondary | ICD-10-CM

## 2018-06-17 DIAGNOSIS — F410 Panic disorder [episodic paroxysmal anxiety] without agoraphobia: Secondary | ICD-10-CM | POA: Diagnosis not present

## 2018-06-17 DIAGNOSIS — F331 Major depressive disorder, recurrent, moderate: Secondary | ICD-10-CM | POA: Diagnosis not present

## 2018-06-17 MED ORDER — TEMAZEPAM 22.5 MG PO CAPS: 22.5000 mg | ORAL_CAPSULE | Freq: Every evening | ORAL | 2 refills | Status: DC | PRN

## 2018-06-17 NOTE — Progress Notes (Signed)
[place] MD OP Progress Note  06/17/2018 4:48 PM Evelyn Bentley  MRN:  [id number]  Chief Complaint: ' I am here for follow up.  Chief Complaint    Follow-up; Medication Refill     HPI: [name] is a 62 year old Caucasian female who is widowed, has a history of MDD, lives in [place], has a history of depression, sleep problems, presented to the clinic today for a follow-up visit.  Patient today reports her mood as improving.  She reports she feels she is at a better place now.  She continues to be on Cymbalta and Wellbutrin which has been effective.  Patient has a history of insomnia for which she takes temazepam.  She reports temazepam is helpful.  She also has a history of sleepiness/hypersomnia during the daytime.  Patient however reports she was able to figure out that her Lyrica could have been contributing to the same.  Patient reports that she went without the Lyrica for a few days due to her health insurance plan change and at that time she felt less groggy and less sleepy during the day.  Patient however reports during that time she struggleD with fibromyalgia pain and realized that she needed to be on Lyrica for the same.  Patient continues to have some memory problems on and off.  She is currently on vitamin B12 replacement.  Discussed with patient to keep monitoring her symptoms and that once she is done with her vitamin B12 replacement and her symptoms does not get improved then she can be referred to neurology.  She is also aware that her medications/polypharmacy could be contributing to her memory issues too.  Visit Diagnosis:    ICD-10-CM   1. MDD (major depressive disorder), recurrent episode, moderate (HCC) F33.1   2. Panic attack F41.0   3. Insomnia due to mental condition F51.05     Past Psychiatric History: Reviewed past psychiatric history from my progress note on 02/26/2018.  Past Medical History:  Past Medical History:  Diagnosis Date  . Anxiety   . Arthritis   .  Bursitis    Rt hip  . Complication of anesthesia    nausea  . Depression   . Fibromyalgia   . GERD (gastroesophageal reflux disease)   . Hypercholesteremia   . Hypertension   . Hypothyroidism   . Thyroid disease     Past Surgical History:  Procedure Laterality Date  . ABDOMINAL HYSTERECTOMY    . CHOLECYSTECTOMY    . COLONOSCOPY WITH PROPOFOL N/A 11/13/2016   Procedure: COLONOSCOPY WITH PROPOFOL;  Surgeon: Lollie Sails, MD;  Location: [facility] ENDOSCOPY;  Service: Endoscopy;  Laterality: N/A;  . ESOPHAGOGASTRODUODENOSCOPY (EGD) WITH PROPOFOL N/A 11/13/2016   Procedure: ESOPHAGOGASTRODUODENOSCOPY (EGD) WITH PROPOFOL;  Surgeon: Lollie Sails, MD;  Location: [facility] ENDOSCOPY;  Service: Endoscopy;  Laterality: N/A;  . LAPAROSCOPIC HYSTERECTOMY Bilateral 02/28/2016   Procedure: HYSTERECTOMY TOTAL LAPAROSCOPIC / BSO;  Surgeon: Honor Loh Ward, MD;  Location: [facility] ORS;  Service: Gynecology;  Laterality: Bilateral;  . NECK SURGERY    . NISSEN FUNDOPLICATION    . TUBAL LIGATION    . URETEROSCOPY WITH HOLMIUM LASER LITHOTRIPSY Left 07/03/2016   Procedure: URETEROSCOPY WITH HOLMIUM LASER LITHOTRIPSY;  Surgeon: Royston Cowper, MD;  Location: [facility] ORS;  Service: Urology;  Laterality: Left;    Family Psychiatric History: Reviewed family psychiatric history from my progress note on 02/26/2018.  Family History:  Family History  Problem Relation Age of Onset  . Depression Mother   .  Anxiety disorder Mother   . Breast cancer Neg Hx     Social History: Reviewed social history from my progress note on 02/26/2018. Social History   Socioeconomic History  . Marital status: Widowed    Spouse name: Not on file  . Number of children: 2  . Years of education: Not on file  . Highest education level: High school graduate  Occupational History    Comment: fulltime  Social Needs  . Financial resource strain: Not hard at all  . Food insecurity:    Worry: Never true    Inability: Never true  .  Transportation needs:    Medical: No    Non-medical: No  Tobacco Use  . Smoking status: Former Smoker    Start date: 11/14/1974    Last attempt to quit: 05/14/2014    Years since quitting: 4.0  . Smokeless tobacco: Never Used  Substance and Sexual Activity  . Alcohol use: Yes    Alcohol/week: 0.0 - 0.6 oz    Comment: social rare  . Drug use: No  . Sexual activity: Not Currently  Lifestyle  . Physical activity:    Days per week: 0 days    Minutes per session: 0 min  . Stress: Very much  Relationships  . Social connections:    Talks on phone: Never    Gets together: More than three times a week    Attends religious service: Never    Active member of club or organization: No    Attends meetings of clubs or organizations: Never    Relationship status: Widowed  Other Topics Concern  . Not on file  Social History Narrative  . Not on file    Allergies:  Allergies  Allergen Reactions  . Codeine Nausea Only    Metabolic Disorder Labs: No results found for: HGBA1C, MPG No results found for: PROLACTIN No results found for: CHOL, TRIG, HDL, CHOLHDL, VLDL, LDLCALC No results found for: TSH  Therapeutic Level Labs: No results found for: LITHIUM No results found for: VALPROATE No components found for:  CBMZ  Current Medications: Current Outpatient Medications  Medication Sig Dispense Refill  . atorvastatin (LIPITOR) 10 MG tablet Take 20 mg by mouth daily.    Marland Kitchen atorvastatin (LIPITOR) 20 MG tablet atorvastatin 20 mg tablet    . baclofen (LIORESAL) 10 MG tablet     . buPROPion (WELLBUTRIN XL) 300 MG 24 hr tablet Take 1 tablet (300 mg total) by mouth daily. 90 tablet 1  . celecoxib (CELEBREX) 200 MG capsule     . Cholecalciferol (VITAMIN D3) 1000 units CAPS Take 1 capsule by mouth daily.    . cloNIDine (CATAPRES) 0.1 MG tablet Take 0.1 mg by mouth at bedtime.  4  . DULoxetine (CYMBALTA) 20 MG capsule Take 1 capsule (20 mg total) by mouth daily. TO BE TAKEN ALONG WITH THE 60 MG  90 capsule 1  . DULoxetine (CYMBALTA) 60 MG capsule Take 1 capsule (60 mg total) by mouth at bedtime. TO BE TAKEN ALONG WITH 20 MG 90 capsule 1  . estradiol (CLIMARA - DOSED IN MG/24 HR) 0.0375 mg/24hr patch     . fluticasone (FLONASE) 50 MCG/ACT nasal spray fluticasone 50 mcg/actuation nasal spray,suspension    . hydrOXYzine (ATARAX/VISTARIL) 10 MG tablet TAKE 1-2 TABLETS (10-20 MG TOTAL) BY MOUTH 2 (TWO) TIMES DAILY AS NEEDED (SEVERE ANXIETY SX). 120 tablet 1  . levothyroxine (SYNTHROID, LEVOTHROID) 137 MCG tablet TAKE ONE TABLET BY MOUTH EVERY MORNING ON AN EMPTY STOMACH.  3  . levothyroxine (SYNTHROID, LEVOTHROID) 150 MCG tablet levothyroxine 150 mcg tablet    . lisinopril-hydrochlorothiazide (PRINZIDE,ZESTORETIC) 10-12.5 MG tablet Take 1 tablet by mouth daily.   2  . nitrofurantoin (MACRODANTIN) 100 MG capsule Take 100 mg by mouth 2 (two) times daily.  0  . pregabalin (LYRICA) 50 MG capsule Take 1 capsule (50 mg total) by mouth at bedtime. 90 capsule 1  . [START ON 07/08/2018] temazepam (RESTORIL) 22.5 MG capsule Take 1 capsule (22.5 mg total) by mouth at bedtime as needed for sleep. 30 capsule 2   No current facility-administered medications for this visit.      Musculoskeletal: Strength & Muscle Tone: within normal limits Gait & Station: normal Patient leans: N/A  Psychiatric Specialty Exam: Review of Systems  Constitutional: Positive for malaise/fatigue.  Psychiatric/Behavioral: Positive for depression (Improving).  All other systems reviewed and are negative.   Blood pressure 123/79, pulse 85, temperature 98 F (36.7 C), temperature source Oral, weight 172 lb 12.8 oz (78.4 kg).Body mass index is 33.75 kg/m.  General Appearance: Casual  Eye Contact:  Fair  Speech:  Normal Rate  Volume:  Normal  Mood:  Dysphoric  Affect:  Congruent  Thought Process:  Goal Directed and Descriptions of Associations: Intact  Orientation:  Full (Time, Place, and Person)  Thought Content:  Logical   Suicidal Thoughts:  No  Homicidal Thoughts:  No  Memory:  Immediate;   Fair Recent;   Fair Remote;   Fair  Judgement:  Fair  Insight:  Fair  Psychomotor Activity:  Normal  Concentration:  Concentration: Fair and Attention Span: Fair  Recall:  [employer] of Knowledge: Fair  Language: Fair  Akathisia:  No  Handed:  Right  AIMS (if indicated):na  Assets:  Communication Skills Desire for Improvement Housing Social Support  ADL's:  Intact  Cognition: WNL  Sleep:  Fair   Screenings:   Assessment and Plan: Evalee is a 62 year old Caucasian female, widowed, has a history of MDD, fibromyalgia, memory changes, presented to the clinic today for a follow-up visit.  Patient has several psychosocial stressors including several deaths in the family, son died when he was 65 years old due to neurological problems, had a son who had muscular dystrophy who died at the age of 26, her husband passed away soon after that and so on.  Patient continues to struggle with some memory issues for which she is currently receiving vitamin B12 replacement.  She otherwise reports mood symptoms are stable.  Plan as noted below.  Plan For depression Continue Cymbalta 80 mg p.o. Daily. Continue Wellbutrin XL 300 mg p.o. daily.  For panic attacks Continue Cymbalta 80 mg p.o. daily Continue hydroxyzine 10-20 mg p.o. twice daily as needed Patient will continue psychotherapy with Ms. Peacock.  For insomnia Temazepam 22.5 mg p.o. nightly.  For cognitive changes Patient is currently on vitamin B12 replacement.  Also discussed the risk of being on medications like temazepam, Lyrica and hydroxyzine which can also contribute to cognitive issues. We will continue to monitor closely and possible referral to neurology in the future.  Follow-up in clinic in 2 months.  More than 50 % of the time was spent for psychoeducation and supportive psychotherapy and care coordination.  This note was generated in  part or whole with voice recognition software. Voice recognition is usually quite accurate but there are transcription errors that can and very often do occur. I apologize for any typographical errors that were not detected and corrected.  Ursula Alert, MD 06/17/2018, 4:48 PM

## 2018-06-23 ENCOUNTER — Ambulatory Visit: Payer: 59 | Admitting: Licensed Clinical Social Worker

## 2018-07-22 ENCOUNTER — Ambulatory Visit: Payer: 59 | Admitting: Licensed Clinical Social Worker

## 2018-07-22 DIAGNOSIS — F331 Major depressive disorder, recurrent, moderate: Secondary | ICD-10-CM | POA: Diagnosis not present

## 2018-07-25 ENCOUNTER — Other Ambulatory Visit: Payer: Self-pay | Admitting: Psychiatry

## 2018-07-25 DIAGNOSIS — G4710 Hypersomnia, unspecified: Secondary | ICD-10-CM

## 2018-07-25 DIAGNOSIS — F331 Major depressive disorder, recurrent, moderate: Secondary | ICD-10-CM

## 2018-07-29 NOTE — Progress Notes (Signed)
   THERAPIST PROGRESS NOTE  Session Time: 56mn  Participation Level: Active  Behavioral Response: CasualAlertDepressed  Type of Therapy: Individual Therapy  Treatment Goals addressed: Coping  Interventions: CBT and Motivational Interviewing  Summary: Evelyn PINKARDis a 62y.o. female who presents with an increase in symptoms.  Therapist met with Patient in an outpatient setting to assess current mood and assist with making progress towards goals through the use of therapeutic intervention. Therapist did a brief mood check, assessing anger, fear, disgust, excitement, happiness, and sadness.  Patient reports that she has isolated herself and is worried about finances. Explored with Patient her children and her ability to ask her daughter for assistance.  Discussed with Patient techniques to help her daughter.  Patient vented her frustration with her job and her current worries.  Encouraged  PAtient to journal and provided her with a journal entry and how to journal    Suicidal/Homicidal: No  Plan: Return again in 2 weeks.  Diagnosis: Axis I: Depression    Axis II: No diagnosis    NLubertha South LCSW 07/22/18

## 2018-08-14 ENCOUNTER — Ambulatory Visit: Payer: 59 | Admitting: Psychiatry

## 2018-08-14 ENCOUNTER — Other Ambulatory Visit: Payer: Self-pay

## 2018-08-14 ENCOUNTER — Encounter: Payer: Self-pay | Admitting: Psychiatry

## 2018-08-14 VITALS — BP 112/71 | HR 105 | Temp 98.0°F | Wt 172.6 lb

## 2018-08-14 DIAGNOSIS — F410 Panic disorder [episodic paroxysmal anxiety] without agoraphobia: Secondary | ICD-10-CM

## 2018-08-14 DIAGNOSIS — F5105 Insomnia due to other mental disorder: Secondary | ICD-10-CM | POA: Diagnosis not present

## 2018-08-14 DIAGNOSIS — G4710 Hypersomnia, unspecified: Secondary | ICD-10-CM | POA: Diagnosis not present

## 2018-08-14 DIAGNOSIS — F331 Major depressive disorder, recurrent, moderate: Secondary | ICD-10-CM

## 2018-08-14 MED ORDER — HYDROXYZINE HCL 10 MG PO TABS: 10.0000 mg | ORAL_TABLET | Freq: Two times a day (BID) | ORAL | 3 refills | Status: DC | PRN

## 2018-08-14 MED ORDER — TEMAZEPAM 22.5 MG PO CAPS: 22.5000 mg | ORAL_CAPSULE | Freq: Every evening | ORAL | 2 refills | Status: DC | PRN

## 2018-08-14 MED ORDER — BUPROPION HCL ER (XL) 300 MG PO TB24: 300.0000 mg | ORAL_TABLET | Freq: Every day | ORAL | 1 refills | Status: DC

## 2018-08-14 MED ORDER — DULOXETINE HCL 60 MG PO CPEP: 60.0000 mg | ORAL_CAPSULE | Freq: Every day | ORAL | 1 refills | Status: DC

## 2018-08-14 MED ORDER — DULOXETINE HCL 20 MG PO CPEP: 20.0000 mg | ORAL_CAPSULE | Freq: Every day | ORAL | 1 refills | Status: DC

## 2018-08-14 NOTE — Patient Instructions (Signed)
Vitamin B12 Deficiency Vitamin B12 deficiency occurs when the body does not have enough vitamin B12. Vitamin B12 is an important vitamin. The body needs vitamin B12:  To make red blood cells.  To make DNA. This is the genetic material inside cells.  To help the nerves work properly so they can carry messages from the brain to the body.  Vitamin B12 deficiency can cause various health problems, such as a low red blood cell count (anemia) or nerve damage. What are the causes? This condition may be caused by:  Not eating enough foods that contain vitamin B12.  Not having enough stomach acid and digestive fluids to properly absorb vitamin B12 from the food that you eat.  Certain digestive system diseases that make it hard to absorb vitamin B12. These diseases include Crohn disease, chronic pancreatitis, and cystic fibrosis.  Pernicious anemia. This is a condition in which the body does not make enough of a protein (intrinsic factor), resulting in too few red blood cells.  Having a surgery in which part of the stomach or small intestine is removed.  Taking certain medicines that make it hard for the body to absorb vitamin B12. These medicines include: ? Heartburn medicine (antacids and proton pump inhibitors). ? An antibiotic medicine called neomycin. ? Some medicines that are used to treat diabetes, tuberculosis, gout, or high cholesterol.  What increases the risk? The following factors may make you more likely to develop a B12 deficiency:  Being older than age 50.  Eating a vegetarian or vegan diet, especially while you are pregnant.  Eating a poor diet while you are pregnant.  Taking certain drugs.  Having alcoholism.  What are the signs or symptoms? In some cases, there are no symptoms of this condition. If the condition leads to anemia or nerve damage, various symptoms can occur, such as:  Weakness.  Fatigue.  Loss of appetite.  Weight loss.  Numbness or tingling  in your hands and feet.  Redness and burning of the tongue.  Confusion or memory problems.  Depression.  Sensory problems, such as color blindness, ringing in the ears, or loss of taste.  Diarrhea or constipation.  Trouble walking.  If anemia is severe, symptoms can include:  Shortness of breath.  Dizziness.  Rapid heart rate (tachycardia).  How is this diagnosed? This condition may be diagnosed with a blood test to measure the level of vitamin B12 in your blood. You may have other tests to help find the cause of your vitamin B12 deficiency. These tests may include:  A complete blood count (CBC). This is a group of tests that measure certain characteristics of blood cells.  A blood test to measure intrinsic factor.  An endoscopy. In this procedure, a thin tube with a camera on the end is used to look into your stomach or intestines.  How is this treated? Treatment for this condition depends on the cause. Common treatment options include:  Changing your eating and drinking habits, such as: ? Eating more foods that contain vitamin B12. ? Drinking less alcohol or no alcohol.  Taking vitamin B12 supplements. Your health care provider will tell you which dosage is best for you.  Getting vitamin B12 injections.  Follow these instructions at home:  Take supplements only as told by your health care provider. Follow the directions carefully.  Get any injections that are prescribed by your health care provider.  Do not miss your appointments.  Eat lots of healthy foods that contain vitamin B12.   Ask your health care provider if you should work with a dietitian. Foods that contain vitamin B12 include: ? Meat. ? Meat from birds (poultry). ? Fish. ? Eggs. ? Cereal and dairy products that are fortified. This means that vitamin B12 has been added to the food. Check the label on the package to see if the food is fortified.  Do not abuse alcohol.  Keep all follow-up visits as  told by your health care provider. This is important. Contact a health care provider if:  Your symptoms come back. Get help right away if:  You develop shortness of breath.  You have chest pain.  You become dizzy or you lose consciousness. This information is not intended to replace advice given to you by your health care provider. Make sure you discuss any questions you have with your health care provider. Document Released: 01/21/2012 Document Revised: 04/11/2016 Document Reviewed: 03/16/2015 Elsevier Interactive Patient Education  2018 [employer]  

## 2018-08-14 NOTE — Progress Notes (Signed)
[facility] MD OP Progress Note  08/14/2018 5:59 PM Evelyn Bentley  MRN:  [id number]  Chief Complaint: ' I am here for follow up." Chief Complaint    Follow-up; Medication Refill     HPI: Evelyn Bentley is a 62 year old Caucasian female who is widowed, has a history of MDD, lives in [place], has a history of depression, sleep problems, presented to the clinic today for a follow-up visit.  Patient today reports her mood symptoms as stable on the current medication regimen.  She denies any significant anxiety or depressive symptoms.  She continues to be compliant with Cymbalta and Wellbutrin.  She denies any side effects to the medications.  She reports sleep as fair on the temazepam.  She denies any excessive sleepiness during the day.  She continues to take Lyrica which she takes for her fibromyalgia.  She reports Lyrica does cause some drowsiness and grogginess during the day. She however struggles with severe pain if she goes off of it.  Pt reports her confusion and memory problems has been improving.  She reports her vitamin B12 is back to normal limits.Will need to request recent lab reports.  That may also be helping.  Patient denies any suicidality.  Patient denies any homicidality.  Patient denies any perceptual disturbances.  Patient reports work is going well.   Visit Diagnosis:    ICD-10-CM   1. MDD (major depressive disorder), recurrent episode, moderate (HCC) F33.1 DULoxetine (CYMBALTA) 20 MG capsule    DULoxetine (CYMBALTA) 60 MG capsule    hydrOXYzine (ATARAX/VISTARIL) 10 MG tablet   IN EARLY REMISSION  2. Panic attack F41.0 DULoxetine (CYMBALTA) 20 MG capsule    hydrOXYzine (ATARAX/VISTARIL) 10 MG tablet  3. Insomnia due to mental condition F51.05   4. Hypersomnia G47.10    RESOLVED    Past Psychiatric History: Have reviewed past psychiatric history from my progress note on 02/26/2018  Past Medical History:  Past Medical History:  Diagnosis Date  . Anxiety   . Arthritis   .  Bursitis    Rt hip  . Complication of anesthesia    nausea  . Depression   . Fibromyalgia   . GERD (gastroesophageal reflux disease)   . Hypercholesteremia   . Hypertension   . Hypothyroidism   . Thyroid disease     Past Surgical History:  Procedure Laterality Date  . ABDOMINAL HYSTERECTOMY    . CHOLECYSTECTOMY    . COLONOSCOPY WITH PROPOFOL N/A 11/13/2016   Procedure: COLONOSCOPY WITH PROPOFOL;  Surgeon: Lollie Sails, MD;  Location: [facility] ENDOSCOPY;  Service: Endoscopy;  Laterality: N/A;  . ESOPHAGOGASTRODUODENOSCOPY (EGD) WITH PROPOFOL N/A 11/13/2016   Procedure: ESOPHAGOGASTRODUODENOSCOPY (EGD) WITH PROPOFOL;  Surgeon: Lollie Sails, MD;  Location: [facility] ENDOSCOPY;  Service: Endoscopy;  Laterality: N/A;  . LAPAROSCOPIC HYSTERECTOMY Bilateral 02/28/2016   Procedure: HYSTERECTOMY TOTAL LAPAROSCOPIC / BSO;  Surgeon: Honor Loh Ward, MD;  Location: [facility] ORS;  Service: Gynecology;  Laterality: Bilateral;  . NECK SURGERY    . NISSEN FUNDOPLICATION    . TUBAL LIGATION    . URETEROSCOPY WITH HOLMIUM LASER LITHOTRIPSY Left 07/03/2016   Procedure: URETEROSCOPY WITH HOLMIUM LASER LITHOTRIPSY;  Surgeon: Royston Cowper, MD;  Location: [facility] ORS;  Service: Urology;  Laterality: Left;    Family Psychiatric History: Family psychiatric history from my progress note on 02/26/2018  Family History:  Family History  Problem Relation Age of Onset  . Depression Mother   . Anxiety disorder Mother   . Breast cancer Neg Hx  Social History: Reviewed social history from my progress note on 02/26/2018 Social History   Socioeconomic History  . Marital status: Widowed    Spouse name: Not on file  . Number of children: 2  . Years of education: Not on file  . Highest education level: High school graduate  Occupational History    Comment: fulltime  Social Needs  . Financial resource strain: Not hard at all  . Food insecurity:    Worry: Never true    Inability: Never true  . Transportation  needs:    Medical: No    Non-medical: No  Tobacco Use  . Smoking status: Former Smoker    Start date: 11/14/1974    Last attempt to quit: 05/14/2014    Years since quitting: 4.2  . Smokeless tobacco: Never Used  Substance and Sexual Activity  . Alcohol use: Yes    Alcohol/week: 0.0 - 1.0 standard drinks    Comment: social rare  . Drug use: No  . Sexual activity: Not Currently  Lifestyle  . Physical activity:    Days per week: 0 days    Minutes per session: 0 min  . Stress: Very much  Relationships  . Social connections:    Talks on phone: Never    Gets together: More than three times a week    Attends religious service: Never    Active member of club or organization: No    Attends meetings of clubs or organizations: Never    Relationship status: Widowed  Other Topics Concern  . Not on file  Social History Narrative  . Not on file    Allergies:  Allergies  Allergen Reactions  . Codeine Nausea Only    Metabolic Disorder Labs: No results found for: HGBA1C, MPG No results found for: PROLACTIN No results found for: CHOL, TRIG, HDL, CHOLHDL, VLDL, LDLCALC No results found for: TSH  Therapeutic Level Labs: No results found for: LITHIUM No results found for: VALPROATE No components found for:  CBMZ  Current Medications: Current Outpatient Medications  Medication Sig Dispense Refill  . atorvastatin (LIPITOR) 20 MG tablet atorvastatin 20 mg tablet    . baclofen (LIORESAL) 10 MG tablet     . buPROPion (WELLBUTRIN XL) 300 MG 24 hr tablet Take 1 tablet (300 mg total) by mouth daily. 90 tablet 1  . celecoxib (CELEBREX) 200 MG capsule     . Cholecalciferol (VITAMIN D3) 1000 units CAPS Take 1 capsule by mouth daily.    . cloNIDine (CATAPRES) 0.1 MG tablet Take 0.1 mg by mouth at bedtime.  4  . DEXILANT 60 MG capsule Take 60 mg by mouth daily.  2  . DULoxetine (CYMBALTA) 20 MG capsule Take 1 capsule (20 mg total) by mouth daily. TO BE TAKEN ALONG WITH THE 60 MG 90 capsule 1   . DULoxetine (CYMBALTA) 60 MG capsule Take 1 capsule (60 mg total) by mouth at bedtime. TO BE TAKEN ALONG WITH 20 MG 90 capsule 1  . estradiol (CLIMARA - DOSED IN MG/24 HR) 0.0375 mg/24hr patch     . fluticasone (FLONASE) 50 MCG/ACT nasal spray fluticasone 50 mcg/actuation nasal spray,suspension    . hydrOXYzine (ATARAX/VISTARIL) 10 MG tablet Take 1-2 tablets (10-20 mg total) by mouth 2 (two) times daily as needed (SEVERE ANXIETY SX). 120 tablet 3  . levothyroxine (SYNTHROID, LEVOTHROID) 137 MCG tablet TAKE ONE TABLET BY MOUTH EVERY MORNING ON AN EMPTY STOMACH.  3  . lisinopril-hydrochlorothiazide (PRINZIDE,ZESTORETIC) 10-12.5 MG tablet Take 1 tablet by mouth  daily.   2  . meloxicam (MOBIC) 15 MG tablet Take by mouth.    . nitrofurantoin (MACRODANTIN) 100 MG capsule Take 100 mg by mouth 2 (two) times daily.  0  . pregabalin (LYRICA) 75 MG capsule Lyrica 75 mg capsule    . [START ON 09/15/2018] temazepam (RESTORIL) 22.5 MG capsule Take 1 capsule (22.5 mg total) by mouth at bedtime as needed for sleep. 30 capsule 2  . Vitamin D, Ergocalciferol, (DRISDOL) 50000 units CAPS capsule TAKE 1 CAP BY MOUTH ONCE A WEEK  3   No current facility-administered medications for this visit.      Musculoskeletal: Strength & Muscle Tone: within normal limits Gait & Station: normal Patient leans: N/A  Psychiatric Specialty Exam: Review of Systems  Psychiatric/Behavioral: Positive for depression (IMPROVING). The patient is nervous/anxious.     Blood pressure 112/71, pulse (!) 105, temperature 98 F (36.7 C), temperature source Oral, weight 172 lb 9.6 oz (78.3 kg).Body mass index is 33.71 kg/m.  General Appearance: Casual  Eye Contact:  Fair  Speech:  Clear and Coherent  Volume:  Normal  Mood:  Anxious  Affect:  Appropriate  Thought Process:  Goal Directed and Descriptions of Associations: Intact  Orientation:  Full (Time, Place, and Person)  Thought Content: Logical   Suicidal Thoughts:  No   Homicidal Thoughts:  No  Memory:  Immediate;   Fair Recent;   Fair Remote;   Fair  Judgement:  Fair  Insight:  Fair  Psychomotor Activity:  Normal  Concentration:  Concentration: Fair and Attention Span: Fair  Recall:  [employer] of Knowledge: Fair  Language: Fair  Akathisia:  No  Handed:  Right  AIMS (if indicated): na  Assets:  Communication Skills Desire for Improvement Social Support  ADL's:  Intact  Cognition: WNL  Sleep:  Fair   Screenings:   Assessment and Plan: Arah is a 62 year old Caucasian female, widowed, has a history of MDD, fibromyalgia, memory changes, presented to the clinic today for a follow-up visit.  Patient does have psychosocial stressors including several deaths in the family, son died when he was 61 years old due to neurological problems, had a son who had muscular dystrophy who died at the age of 74, her husband passed away soon after that and so on.  She reports her memory problems as well as her mood symptoms is improved.  Discussed plan as noted below.  Plan For depression Continue Cymbalta 80 mg p.o. daily Continue Wellbutrin XL 300 mg p.o. daily  For panic attacks Continue Cymbalta 80 mg p.o. daily Continue hydroxyzine 10-20 mg p.o. twice daily as needed Patient will continue psychotherapy with Ms. Peacock  For insomnia Temazepam 22.5 mg p.o. nightly  For cognitive changes Patient reports improvement.  She will continue vitamin B12 replacement. Patient also informed about the risk of being on medications like temazepam, Lyrica, hydroxyzine which can all contribute to cognitive changes.   Follow-up in clinic in 2-3 months or sooner if needed.  More than 50 % of the time was spent for psychoeducation and supportive psychotherapy and care coordination.  This note was generated in part or whole with voice recognition software. Voice recognition is usually quite accurate but there are transcription errors that can and very often do occur. I  apologize for any typographical errors that were not detected and corrected.       Ursula Alert, MD 08/15/2018, 9:08 AM

## 2018-08-15 ENCOUNTER — Encounter: Payer: Self-pay | Admitting: Psychiatry

## 2018-08-21 ENCOUNTER — Ambulatory Visit: Payer: 59 | Admitting: Licensed Clinical Social Worker

## 2018-08-21 DIAGNOSIS — F331 Major depressive disorder, recurrent, moderate: Secondary | ICD-10-CM | POA: Diagnosis not present

## 2018-09-10 ENCOUNTER — Other Ambulatory Visit: Payer: Self-pay | Admitting: Psychiatry

## 2018-09-10 DIAGNOSIS — F331 Major depressive disorder, recurrent, moderate: Secondary | ICD-10-CM

## 2018-09-10 DIAGNOSIS — F410 Panic disorder [episodic paroxysmal anxiety] without agoraphobia: Secondary | ICD-10-CM

## 2018-09-18 NOTE — Progress Notes (Signed)
   THERAPIST PROGRESS NOTE  Session Time: 60min  Participation Level: Active  Behavioral Response: CasualAlertDepressed  Type of Therapy: Individual Therapy  Treatment Goals addressed: Coping  Interventions: Solution Focused  Summary: Evelyn Bentley is a 62 y.o. female who presents with an increase in symptoms.  Therapist met with Patient in an outpatient setting to assess current mood and assist with making progress towards goals through the use of therapeutic intervention. Therapist did a brief mood check, assessing anger, fear, disgust, excitement, happiness, and sadness.  Patient reports an "unfavorable " mood.  Patient listed her current stressors which is the possibility of her having to sell her home and move into an apartment.  Actively listened as Patient listed her concerns.  Encouraged patient to continue to use her support system to vent to.  Use therapeutic financial planning to assist Patient with understanding her income and budgeting.  Provided Patient with information to [employer].    Suicidal/Homicidal: No  Plan: Return again in 2 weeks.  Diagnosis: Axis I: Depression    Axis II: No diagnosis     M , LCSW 08/21/2018  

## 2018-11-18 ENCOUNTER — Encounter: Payer: Self-pay | Admitting: Psychiatry

## 2018-11-18 ENCOUNTER — Ambulatory Visit: Payer: 59 | Admitting: Psychiatry

## 2018-11-18 VITALS — BP 125/70 | HR 100 | Ht 61.5 in | Wt 174.0 lb

## 2018-11-18 DIAGNOSIS — F5105 Insomnia due to other mental disorder: Secondary | ICD-10-CM

## 2018-11-18 DIAGNOSIS — F410 Panic disorder [episodic paroxysmal anxiety] without agoraphobia: Secondary | ICD-10-CM

## 2018-11-18 DIAGNOSIS — F331 Major depressive disorder, recurrent, moderate: Secondary | ICD-10-CM

## 2018-11-18 MED ORDER — TEMAZEPAM 22.5 MG PO CAPS: 22.5000 mg | ORAL_CAPSULE | Freq: Every evening | ORAL | 3 refills | Status: DC | PRN

## 2018-11-18 NOTE — Progress Notes (Signed)
[place] MD OP Progress Note  11/18/2018 5:15 PM Evelyn Bentley  MRN:  [id number]  Chief Complaint:  Chief Complaint    Follow-up    ' I am here for follow up."  HPI: Evelyn Bentley is a 63 year old Caucasian female who is widowed, has a history of MDD, lives at [facility], has a history of depression, sleep problems, presented to the clinic today for a follow-up visit.  She today reports she is struggling with some cough and upper respiratory infection.  She reports she is currently on antibiotics.  She is recovering gradually.  She reports her mood symptoms as otherwise fair.  She denies any significant sadness or anxiety symptoms.  She reports she is compliant on her medications and tolerating them well.  She reports sleep is good on the temazepam.  Patient denies any suicidality.  Patient denies any perceptual disturbances.  Patient denies any other concerns today. Visit Diagnosis:    ICD-10-CM   1. MDD (major depressive disorder), recurrent episode, moderate (HCC) F33.1    improving  2. Panic attack F41.0    improving  3. Insomnia due to mental condition F51.05    improving    Past Psychiatric History: Reviewed past psychiatric history from my progress note on 02/26/2018.  Past Medical History:  Past Medical History:  Diagnosis Date  . Anxiety   . Arthritis   . Bursitis    Rt hip  . Complication of anesthesia    nausea  . Depression   . Fibromyalgia   . GERD (gastroesophageal reflux disease)   . Hypercholesteremia   . Hypertension   . Hypothyroidism   . Thyroid disease     Past Surgical History:  Procedure Laterality Date  . ABDOMINAL HYSTERECTOMY    . CHOLECYSTECTOMY    . COLONOSCOPY WITH PROPOFOL N/A 11/13/2016   Procedure: COLONOSCOPY WITH PROPOFOL;  Surgeon: Lollie Sails, MD;  Location: [facility] ENDOSCOPY;  Service: Endoscopy;  Laterality: N/A;  . ESOPHAGOGASTRODUODENOSCOPY (EGD) WITH PROPOFOL N/A 11/13/2016   Procedure: ESOPHAGOGASTRODUODENOSCOPY (EGD) WITH PROPOFOL;   Surgeon: Lollie Sails, MD;  Location: [facility] ENDOSCOPY;  Service: Endoscopy;  Laterality: N/A;  . LAPAROSCOPIC HYSTERECTOMY Bilateral 02/28/2016   Procedure: HYSTERECTOMY TOTAL LAPAROSCOPIC / BSO;  Surgeon: Honor Loh Ward, MD;  Location: [facility] ORS;  Service: Gynecology;  Laterality: Bilateral;  . NECK SURGERY    . NISSEN FUNDOPLICATION    . TUBAL LIGATION    . URETEROSCOPY WITH HOLMIUM LASER LITHOTRIPSY Left 07/03/2016   Procedure: URETEROSCOPY WITH HOLMIUM LASER LITHOTRIPSY;  Surgeon: Royston Cowper, MD;  Location: [facility] ORS;  Service: Urology;  Laterality: Left;    Family Psychiatric History: I have reviewed family history from my progress note on 02/26/2018.  Family History:  Family History  Problem Relation Age of Onset  . Depression Mother   . Anxiety disorder Mother   . Breast cancer Neg Hx     Social History: I have reviewed social history from my progress note on 02/26/2018. Social History   Socioeconomic History  . Marital status: Widowed    Spouse name: Not on file  . Number of children: 2  . Years of education: Not on file  . Highest education level: High school graduate  Occupational History    Comment: fulltime  Social Needs  . Financial resource strain: Not hard at all  . Food insecurity:    Worry: Never true    Inability: Never true  . Transportation needs:    Medical: No    Non-medical:  No  Tobacco Use  . Smoking status: Former Smoker    Start date: 11/14/1974    Last attempt to quit: 05/14/2014    Years since quitting: 4.5  . Smokeless tobacco: Never Used  Substance and Sexual Activity  . Alcohol use: Not Currently    Alcohol/week: 0.0 - 1.0 standard drinks    Comment: social rare  . Drug use: No  . Sexual activity: Yes    Partners: Male    Birth control/protection: Condom  Lifestyle  . Physical activity:    Days per week: 0 days    Minutes per session: 0 min  . Stress: Very much  Relationships  . Social connections:    Talks on phone: Never     Gets together: More than three times a week    Attends religious service: Never    Active member of club or organization: No    Attends meetings of clubs or organizations: Never    Relationship status: Widowed  Other Topics Concern  . Not on file  Social History Narrative  . Not on file    Allergies:  Allergies  Allergen Reactions  . Codeine Nausea Only    Metabolic Disorder Labs: No results found for: HGBA1C, MPG No results found for: PROLACTIN No results found for: CHOL, TRIG, HDL, CHOLHDL, VLDL, LDLCALC No results found for: TSH  Therapeutic Level Labs: No results found for: LITHIUM No results found for: VALPROATE No components found for:  CBMZ  Current Medications: Current Outpatient Medications  Medication Sig Dispense Refill  . atorvastatin (LIPITOR) 20 MG tablet atorvastatin 20 mg tablet    . buPROPion (WELLBUTRIN XL) 300 MG 24 hr tablet Take 1 tablet (300 mg total) by mouth daily. 90 tablet 1  . celecoxib (CELEBREX) 200 MG capsule     . Cholecalciferol (VITAMIN D3) 1000 units CAPS Take 1 capsule by mouth daily.    . cloNIDine (CATAPRES) 0.1 MG tablet Take 0.1 mg by mouth at bedtime.  4  . DEXILANT 60 MG capsule Take 60 mg by mouth daily.  2  . DULoxetine (CYMBALTA) 20 MG capsule Take 1 capsule (20 mg total) by mouth daily. TO BE TAKEN ALONG WITH THE 60 MG 90 capsule 1  . DULoxetine (CYMBALTA) 60 MG capsule Take 1 capsule (60 mg total) by mouth at bedtime. TO BE TAKEN ALONG WITH 20 MG 90 capsule 1  . fluticasone (FLONASE) 50 MCG/ACT nasal spray fluticasone 50 mcg/actuation nasal spray,suspension    . hydrOXYzine (ATARAX/VISTARIL) 10 MG tablet TAKE 1-2 TABLETS (10-20 MG TOTAL) BY MOUTH 2 (TWO) TIMES DAILY AS NEEDED (SEVERE ANXIETY SX). 360 tablet 2  . levothyroxine (SYNTHROID, LEVOTHROID) 137 MCG tablet TAKE ONE TABLET BY MOUTH EVERY MORNING ON AN EMPTY STOMACH.  3  . lisinopril-hydrochlorothiazide (PRINZIDE,ZESTORETIC) 10-12.5 MG tablet Take 1 tablet by mouth daily.    2  . meloxicam (MOBIC) 15 MG tablet Take by mouth.    . nitrofurantoin (MACRODANTIN) 100 MG capsule Take 100 mg by mouth 2 (two) times daily.  0  . temazepam (RESTORIL) 22.5 MG capsule Take 1 capsule (22.5 mg total) by mouth at bedtime as needed for sleep. 30 capsule 3  . Vitamin D, Ergocalciferol, (DRISDOL) 50000 units CAPS capsule TAKE 1 CAP BY MOUTH ONCE A WEEK  3  . baclofen (LIORESAL) 10 MG tablet     . estradiol (CLIMARA - DOSED IN MG/24 HR) 0.0375 mg/24hr patch     . ibuprofen (ADVIL,MOTRIN) 600 MG tablet     .  ipratropium-albuterol (DUONEB) 0.5-2.5 (3) MG/3ML SOLN ipratropium 0.5 mg-albuterol 3 mg (2.5 mg base)/3 mL nebulization soln    . phenazopyridine (PYRIDIUM) 200 MG tablet     . pregabalin (LYRICA) 75 MG capsule Lyrica 75 mg capsule     No current facility-administered medications for this visit.      Musculoskeletal: Strength & Muscle Tone: within normal limits Gait & Station: normal Patient leans: N/A  Psychiatric Specialty Exam: Review of Systems  HENT: Positive for congestion.   Respiratory: Positive for cough.   Psychiatric/Behavioral: Negative for depression, hallucinations, substance abuse and suicidal ideas. The patient is not nervous/anxious and does not have insomnia.     Blood pressure 125/70, pulse 100, height 5' 1.5" (1.562 m), weight 174 lb (78.9 kg).Body mass index is 32.34 kg/m.  General Appearance: Casual  Eye Contact:  Fair  Speech:  Normal Rate  Volume:  Normal  Mood:  Euthymic  Affect:  Congruent  Thought Process:  Goal Directed and Descriptions of Associations: Intact  Orientation:  Full (Time, Place, and Person)  Thought Content: Logical   Suicidal Thoughts:  No  Homicidal Thoughts:  No  Memory:  Immediate;   Fair Recent;   Fair Remote;   Fair  Judgement:  Fair  Insight:  Fair  Psychomotor Activity:  Normal  Concentration:  Concentration: Fair and Attention Span: Fair  Recall:  [employer] of Knowledge: Fair  Language: Fair   Akathisia:  No  Handed:  Right  AIMS (if indicated):denies tremors, rigidity  Assets:  Communication Skills Desire for Improvement Social Support  ADL's:  Intact  Cognition: WNL  Sleep:  Fair   Screenings:   Assessment and Plan: Evelyn Bentley is a 63 year old Caucasian female, widowed, has a history of MDD, fibromyalgia, memory changes, presented to clinic today for a follow-up visit.  Patient had psychosocial stressors including several deaths in the family, son died when he was 47 years old due to neurological problems, son who had muscular dystrophy died at the age of 9, her husband passed away soon after that and so on.  She is currently struggling with upper respiratory infection which is affecting her sleep.  She however reports prior to that she was doing well on the medications.  She hence denies wanting any medication changes today.  We will continue to monitor closely.  Plan as noted below.  Plan For depression-improving Continue Cymbalta 80 mg p.o. daily Wellbutrin XL 300 mg p.o. daily  For panic attacks-improving Cymbalta 80 mg p.o. daily Continue hydroxyzine 10 to 20 mg p.o. daily as needed. Patient will continue psychotherapy with Ms. Peacock.  For insomnia -Improving Temazepam 22.5 mg p.o. nightly  For cognitive changes- improving. Patient will continue vitamin B12 replacement.  Follow-up in clinic in 3 to 4 months or sooner if needed.  I have spent atleast 15 minutes  face to face with patient today. More than 50 % of the time was spent for psychoeducation and supportive psychotherapy and care coordination.  This note was generated in part or whole with voice recognition software. Voice recognition is usually quite accurate but there are transcription errors that can and very often do occur. I apologize for any typographical errors that were not detected and corrected.        Ursula Alert, MD 11/18/2018, 5:15 PM

## 2018-12-15 ENCOUNTER — Other Ambulatory Visit: Payer: Self-pay | Admitting: Neurology

## 2018-12-15 DIAGNOSIS — M542 Cervicalgia: Secondary | ICD-10-CM

## 2018-12-16 ENCOUNTER — Ambulatory Visit: Payer: BLUE CROSS/BLUE SHIELD | Admitting: Anesthesiology

## 2018-12-16 ENCOUNTER — Encounter
Admission: RE | Disposition: A | Discharge status: Home / Self Care | Payer: Self-pay | Source: Home / Self Care | Attending: Gastroenterology

## 2018-12-16 ENCOUNTER — Ambulatory Visit
Admission: RE | Admit: 2018-12-16 | Discharge: 2018-12-16 | Disposition: A | Discharge status: Home / Self Care | Payer: BLUE CROSS/BLUE SHIELD | Attending: Gastroenterology | Admitting: Gastroenterology

## 2018-12-16 ENCOUNTER — Encounter: Payer: Self-pay | Admitting: *Deleted

## 2018-12-16 DIAGNOSIS — Z87891 Personal history of nicotine dependence: Secondary | ICD-10-CM | POA: Insufficient documentation

## 2018-12-16 DIAGNOSIS — Z6833 Body mass index (BMI) 33.0-33.9, adult: Secondary | ICD-10-CM | POA: Insufficient documentation

## 2018-12-16 DIAGNOSIS — K3189 Other diseases of stomach and duodenum: Secondary | ICD-10-CM | POA: Insufficient documentation

## 2018-12-16 DIAGNOSIS — K2970 Gastritis, unspecified, without bleeding: Secondary | ICD-10-CM | POA: Insufficient documentation

## 2018-12-16 DIAGNOSIS — Z885 Allergy status to narcotic agent status: Secondary | ICD-10-CM | POA: Insufficient documentation

## 2018-12-16 DIAGNOSIS — K210 Gastro-esophageal reflux disease with esophagitis: Secondary | ICD-10-CM | POA: Diagnosis not present

## 2018-12-16 DIAGNOSIS — I10 Essential (primary) hypertension: Secondary | ICD-10-CM | POA: Diagnosis not present

## 2018-12-16 DIAGNOSIS — K2210 Ulcer of esophagus without bleeding: Secondary | ICD-10-CM | POA: Diagnosis not present

## 2018-12-16 DIAGNOSIS — F329 Major depressive disorder, single episode, unspecified: Secondary | ICD-10-CM | POA: Diagnosis not present

## 2018-12-16 DIAGNOSIS — Z79899 Other long term (current) drug therapy: Secondary | ICD-10-CM | POA: Diagnosis not present

## 2018-12-16 DIAGNOSIS — E039 Hypothyroidism, unspecified: Secondary | ICD-10-CM | POA: Diagnosis not present

## 2018-12-16 DIAGNOSIS — K449 Diaphragmatic hernia without obstruction or gangrene: Secondary | ICD-10-CM | POA: Insufficient documentation

## 2018-12-16 DIAGNOSIS — M797 Fibromyalgia: Secondary | ICD-10-CM | POA: Diagnosis not present

## 2018-12-16 DIAGNOSIS — E669 Obesity, unspecified: Secondary | ICD-10-CM | POA: Diagnosis not present

## 2018-12-16 DIAGNOSIS — E7800 Pure hypercholesterolemia, unspecified: Secondary | ICD-10-CM | POA: Diagnosis not present

## 2018-12-16 DIAGNOSIS — M1990 Unspecified osteoarthritis, unspecified site: Secondary | ICD-10-CM | POA: Insufficient documentation

## 2018-12-16 DIAGNOSIS — R1013 Epigastric pain: Secondary | ICD-10-CM | POA: Diagnosis present

## 2018-12-16 DIAGNOSIS — F419 Anxiety disorder, unspecified: Secondary | ICD-10-CM | POA: Diagnosis not present

## 2018-12-16 DIAGNOSIS — Z791 Long term (current) use of non-steroidal anti-inflammatories (NSAID): Secondary | ICD-10-CM | POA: Insufficient documentation

## 2018-12-16 HISTORY — PX: ESOPHAGOGASTRODUODENOSCOPY (EGD) WITH PROPOFOL: SHX5813

## 2018-12-16 SURGERY — ESOPHAGOGASTRODUODENOSCOPY (EGD) WITH PROPOFOL
Anesthesia: General

## 2018-12-16 MED ORDER — PROPOFOL 500 MG/50ML IV EMUL
INTRAVENOUS | Status: DC | PRN
  Administered 2018-12-16: 180 ug/kg/min via INTRAVENOUS

## 2018-12-16 MED ORDER — ONDANSETRON HCL 4 MG/2ML IJ SOLN
INTRAMUSCULAR | Status: DC | PRN
  Administered 2018-12-16: 4 mg via INTRAVENOUS

## 2018-12-16 MED ORDER — LIDOCAINE HCL (PF) 2 % IJ SOLN
INTRAMUSCULAR | Status: AC
  Filled 2018-12-16: qty 10

## 2018-12-16 MED ORDER — FENTANYL CITRATE (PF) 100 MCG/2ML IJ SOLN
INTRAMUSCULAR | Status: DC | PRN
  Administered 2018-12-16: 50 ug via INTRAVENOUS

## 2018-12-16 MED ORDER — DEXAMETHASONE SODIUM PHOSPHATE 10 MG/ML IJ SOLN
INTRAMUSCULAR | Status: DC | PRN
  Administered 2018-12-16: 10 mg via INTRAVENOUS

## 2018-12-16 MED ORDER — MIDAZOLAM HCL 2 MG/2ML IJ SOLN
INTRAMUSCULAR | Status: DC | PRN
  Administered 2018-12-16: 1 mg via INTRAVENOUS

## 2018-12-16 MED ORDER — PROPOFOL 10 MG/ML IV BOLUS
INTRAVENOUS | Status: DC | PRN
  Administered 2018-12-16: 50 mg via INTRAVENOUS

## 2018-12-16 MED ORDER — PROPOFOL 500 MG/50ML IV EMUL
INTRAVENOUS | Status: AC
  Filled 2018-12-16: qty 50

## 2018-12-16 MED ORDER — LIDOCAINE HCL (CARDIAC) PF 100 MG/5ML IV SOSY
PREFILLED_SYRINGE | INTRAVENOUS | Status: DC | PRN
  Administered 2018-12-16: 50 mg via INTRAVENOUS
  Administered 2018-12-16: 100 mg via INTRAVENOUS

## 2018-12-16 MED ORDER — MIDAZOLAM HCL 2 MG/2ML IJ SOLN
INTRAMUSCULAR | Status: AC
  Filled 2018-12-16: qty 2

## 2018-12-16 MED ORDER — SODIUM CHLORIDE 0.9 % IV SOLN
INTRAVENOUS | Status: DC
  Administered 2018-12-16: 1000 mL via INTRAVENOUS
  Administered 2018-12-16: 11:00:00 via INTRAVENOUS

## 2018-12-16 MED ORDER — FENTANYL CITRATE (PF) 100 MCG/2ML IJ SOLN
INTRAMUSCULAR | Status: AC
  Filled 2018-12-16: qty 2

## 2018-12-16 NOTE — H&P (Signed)
Outpatient short stay form Pre-procedure 12/16/2018 11:14 AM Evelyn Sails MD  Primary Physician: Dr. Lamonte Sakai  Reason for visit: EGD  History of present illness: Patient is a 63 year old female presenting today for an EGD.  This is in regards to epigastric abdominal pain.  This is nonradiating and located in the low retrosternal versus upper epigastrium.  She had been taking meloxicam up to about a week or so ago.  She does take Dexilant regularly however does not eat a meal afterwards.  She was recently started on some Carafate this seems to be helpful.  Takes no aspirin or blood thinning agent.    Current Facility-Administered Medications:  .  0.9 %  sodium chloride infusion, , Intravenous, Continuous, Evelyn Sails, MD, Last Rate: 20 mL/hr at 12/16/18 1054, 1,000 mL at 12/16/18 1054  Medications Prior to Admission  Medication Sig Dispense Refill Last Dose  . gabapentin (NEURONTIN) 100 MG capsule Take 100 mg by mouth 3 (three) times daily.     . rosuvastatin (CRESTOR) 20 MG tablet Take 20 mg by mouth daily.     Marland Kitchen buPROPion (WELLBUTRIN XL) 300 MG 24 hr tablet Take 1 tablet (300 mg total) by mouth daily. 90 tablet 1 Taking  . Cholecalciferol (VITAMIN D3) 1000 units CAPS Take 1 capsule by mouth daily.   Taking  . cloNIDine (CATAPRES) 0.1 MG tablet Take 0.1 mg by mouth at bedtime.  4 Taking  . DEXILANT 60 MG capsule Take 60 mg by mouth daily.  2 Taking  . DULoxetine (CYMBALTA) 20 MG capsule Take 1 capsule (20 mg total) by mouth daily. TO BE TAKEN ALONG WITH THE 60 MG 90 capsule 1 Taking  . DULoxetine (CYMBALTA) 60 MG capsule Take 1 capsule (60 mg total) by mouth at bedtime. TO BE TAKEN ALONG WITH 20 MG 90 capsule 1 Taking  . fluticasone (FLONASE) 50 MCG/ACT nasal spray fluticasone 50 mcg/actuation nasal spray,suspension   Taking  . hydrOXYzine (ATARAX/VISTARIL) 10 MG tablet TAKE 1-2 TABLETS (10-20 MG TOTAL) BY MOUTH 2 (TWO) TIMES DAILY AS NEEDED (SEVERE ANXIETY SX). 360 tablet 2  Taking  . ibuprofen (ADVIL,MOTRIN) 600 MG tablet      . levothyroxine (SYNTHROID, LEVOTHROID) 137 MCG tablet TAKE ONE TABLET BY MOUTH EVERY MORNING ON AN EMPTY STOMACH.  3 Taking  . lisinopril-hydrochlorothiazide (PRINZIDE,ZESTORETIC) 10-12.5 MG tablet Take 1 tablet by mouth daily.   2 Taking  . nitrofurantoin (MACRODANTIN) 100 MG capsule Take 100 mg by mouth 2 (two) times daily.  0 Taking  . phenazopyridine (PYRIDIUM) 200 MG tablet      . temazepam (RESTORIL) 22.5 MG capsule Take 1 capsule (22.5 mg total) by mouth at bedtime as needed for sleep. 30 capsule 3   . Vitamin D, Ergocalciferol, (DRISDOL) 50000 units CAPS capsule TAKE 1 CAP BY MOUTH ONCE A WEEK  3 Taking     Allergies  Allergen Reactions  . Codeine Nausea Only     Past Medical History:  Diagnosis Date  . Anxiety   . Arthritis   . Bursitis    Rt hip  . Complication of anesthesia    nausea  . Depression   . Fibromyalgia   . GERD (gastroesophageal reflux disease)   . Hypercholesteremia   . Hypertension   . Hypothyroidism   . Thyroid disease     Review of systems:      Physical Exam    Heart and lungs: The rate and rhythm without rub or gallop, lungs are bilaterally clear.  HEENT: Normocephalic atraumatic eyes are anicteric    Other:    Pertinant exam for procedure: Soft nontender nondistended bowel sounds positive normoactive    Planned proceedures: EGD and indicated procedures. I have discussed the risks benefits and complications of procedures to include not limited to bleeding, infection, perforation and the risk of sedation and the patient wishes to proceed.    Evelyn Sails, MD Gastroenterology 12/16/2018  11:14 AM

## 2018-12-16 NOTE — Op Note (Signed)
[facility] Gastroenterology Patient Name: Evelyn Bentley Procedure Date: 12/16/2018 11:17 AM MRN: [id number] Account #: [id number] Date of Birth: 1956/06/24 Admit Type: Outpatient Age: 63 Room: [facility] ENDO ROOM 2 Gender: Female Note Status: Finalized Procedure:            Upper GI endoscopy Indications:          Epigastric abdominal pain Providers:            Lollie Sails, MD Referring MD:         Perrin Maltese, MD (Referring MD) Medicines:            Monitored Anesthesia Care Complications:        No immediate complications. Procedure:            Pre-Anesthesia Assessment:                       - ASA Grade Assessment: III - A patient with severe                        systemic disease.                       After obtaining informed consent, the endoscope was                        passed under direct vision. Throughout the procedure,                        the patient's blood pressure, pulse, and oxygen                        saturations were monitored continuously. The Endoscope                        was introduced through the mouth, and advanced to the                        fourth part of duodenum. The upper GI endoscopy was                        accomplished without difficulty. The patient tolerated                        the procedure well. Findings:      LA Grade A (one or more mucosal breaks less than 5 mm, not extending       between tops of 2 mucosal folds) esophagitis with no bleeding was found.       Biopsies were taken with a cold forceps for histology.      A small hiatal hernia was found. The Z-line was a variable distance from       incisors; the hiatal hernia was sliding.      Patchy minimal inflammation characterized by congestion (edema) and       erythema was found in the gastric antrum. Biopsies were taken with a       cold forceps for histology.      Evidence of a fundoplication was found in the cardia as seen in       retroflex  examination. The wrap appeared intact.      Patchy mild mucosal variance characterized by flattening and granularity  was found in the posterior duodenal bulb. Biopsies were taken with a       cold forceps for histology.      Normal mucosa was found in the second portion of the duodenum, in the       third portion of the duodenum and in the fourth portion of the duodenum.       Biopsies were taken with a cold forceps for histology. Impression:           - LA Grade A erosive esophagitis. Biopsied.                       - Small hiatal hernia.                       - Gastritis. Biopsied.                       - A fundoplication was found. The wrap appears intact.                       - Mucosal variant in the duodenum. Biopsied.                       - Normal mucosa was found in the second portion of the                        duodenum, in the third portion of the duodenum and in                        the fourth portion of the duodenum. Biopsied. Recommendation:       - Discharge patient to home.                       - Use Dexilant (dexlansoprazole) 60 mg PO daily daily.                       - Use sucralfate tablets 1 gram PO BID.                       - Await pathology results.                       - Return to GI clinic in 4 weeks. Procedure Code(s):    --- Professional ---                       [phone], Esophagogastroduodenoscopy, flexible, transoral;                        with biopsy, single or multiple CPT copyright 2018 American Medical Association. All rights reserved. The codes documented in this report are preliminary and upon coder review may  be revised to meet current compliance requirements. Lollie Sails, MD 12/16/2018 11:54:38 AM This report has been signed electronically. Number of Addenda: 0 Note Initiated On: 12/16/2018 11:17 AM      [facility]

## 2018-12-16 NOTE — Anesthesia Post-op Follow-up Note (Signed)
Anesthesia QCDR form completed.        

## 2018-12-16 NOTE — Anesthesia Preprocedure Evaluation (Signed)
Anesthesia Evaluation  Patient identified by MRN, date of birth, ID band Patient awake    Reviewed: Allergy & Precautions, NPO status , Patient's Chart, lab work & pertinent test results, reviewed documented beta blocker date and time   Airway Mallampati: III  TM Distance: >3 FB     Dental  (+) Chipped   Pulmonary former smoker,           Cardiovascular hypertension, Pt. on medications      Neuro/Psych PSYCHIATRIC DISORDERS Anxiety Depression  Neuromuscular disease    GI/Hepatic GERD  ,  Endo/Other  Hypothyroidism   Renal/GU Renal disease     Musculoskeletal  (+) Arthritis , Fibromyalgia -  Abdominal   Peds  Hematology   Anesthesia Other Findings Obese.  Reproductive/Obstetrics                             Anesthesia Physical Anesthesia Plan  ASA: III  Anesthesia Plan: General   Post-op Pain Management:    Induction: Intravenous  PONV Risk Score and Plan:   Airway Management Planned:   Additional Equipment:   Intra-op Plan:   Post-operative Plan:   Informed Consent: I have reviewed the patients History and Physical, chart, labs and discussed the procedure including the risks, benefits and alternatives for the proposed anesthesia with the patient or authorized representative who has indicated his/her understanding and acceptance.       Plan Discussed with: CRNA  Anesthesia Plan Comments:         Anesthesia Quick Evaluation

## 2018-12-16 NOTE — Transfer of Care (Signed)
Immediate Anesthesia Transfer of Care Note  Patient: Evelyn Bentley  Procedure(s) Performed: ESOPHAGOGASTRODUODENOSCOPY (EGD) WITH PROPOFOL (N/A )  Patient Location: PACU  Anesthesia Type:General  Level of Consciousness: sedated  Airway & Oxygen Therapy: Patient Spontanous Breathing and Patient connected to nasal cannula oxygen  Post-op Assessment: Report given to RN and Post -op Vital signs reviewed and stable  Post vital signs: Reviewed and stable  Last Vitals:  Vitals Value Taken Time  BP 107/60 12/16/2018 11:51 AM  Temp 36.1 C 12/16/2018 11:50 AM  Pulse 93 12/16/2018 11:51 AM  Resp 26 12/16/2018 11:51 AM  SpO2 97 % 12/16/2018 11:51 AM  Vitals shown include unvalidated device data.  Last Pain:  Vitals:   12/16/18 1150  TempSrc: Tympanic  PainSc: Asleep         Complications: No apparent anesthesia complications

## 2018-12-16 NOTE — Anesthesia Procedure Notes (Signed)
Date/Time: 12/16/2018 11:30 AM Performed by: Allean Found, CRNA Pre-anesthesia Checklist: Patient identified, Emergency Drugs available, Suction available, Patient being monitored and Timeout performed Patient Re-evaluated:Patient Re-evaluated prior to induction Oxygen Delivery Method: Nasal cannula Placement Confirmation: positive ETCO2

## 2018-12-16 NOTE — Anesthesia Postprocedure Evaluation (Signed)
Anesthesia Post Note  Patient: Evelyn Bentley  Procedure(s) Performed: ESOPHAGOGASTRODUODENOSCOPY (EGD) WITH PROPOFOL (N/A )  Patient location during evaluation: Endoscopy Anesthesia Type: General Level of consciousness: awake and alert Pain management: pain level controlled Vital Signs Assessment: post-procedure vital signs reviewed and stable Respiratory status: spontaneous breathing, nonlabored ventilation, respiratory function stable and patient connected to nasal cannula oxygen Cardiovascular status: blood pressure returned to baseline and stable Postop Assessment: no apparent nausea or vomiting Anesthetic complications: no     Last Vitals:  Vitals:   12/16/18 1210 12/16/18 1220  BP: 115/63 120/66  Pulse:    Resp:    Temp:    SpO2:      Last Pain:  Vitals:   12/16/18 1220  TempSrc:   PainSc: 0-No pain                 Tyniya Kuyper S

## 2018-12-17 ENCOUNTER — Encounter: Payer: Self-pay | Admitting: Gastroenterology

## 2018-12-17 DIAGNOSIS — R202 Paresthesia of skin: Secondary | ICD-10-CM | POA: Insufficient documentation

## 2018-12-17 DIAGNOSIS — R200 Anesthesia of skin: Secondary | ICD-10-CM | POA: Insufficient documentation

## 2018-12-17 LAB — SURGICAL PATHOLOGY

## 2018-12-24 DIAGNOSIS — G5603 Carpal tunnel syndrome, bilateral upper limbs: Secondary | ICD-10-CM | POA: Insufficient documentation

## 2018-12-24 DIAGNOSIS — R200 Anesthesia of skin: Secondary | ICD-10-CM | POA: Insufficient documentation

## 2018-12-25 ENCOUNTER — Ambulatory Visit: Payer: BLUE CROSS/BLUE SHIELD

## 2018-12-30 ENCOUNTER — Ambulatory Visit
Admission: RE | Admit: 2018-12-30 | Discharge: 2018-12-30 | Disposition: A | Discharge status: Home / Self Care | Payer: BLUE CROSS/BLUE SHIELD | Source: Ambulatory Visit | Attending: Neurology | Admitting: Neurology

## 2018-12-30 DIAGNOSIS — M542 Cervicalgia: Secondary | ICD-10-CM | POA: Insufficient documentation

## 2019-01-07 ENCOUNTER — Other Ambulatory Visit: Payer: Self-pay | Admitting: Family

## 2019-01-07 DIAGNOSIS — Z1231 Encounter for screening mammogram for malignant neoplasm of breast: Secondary | ICD-10-CM

## 2019-01-26 ENCOUNTER — Other Ambulatory Visit: Payer: Self-pay | Admitting: Sports Medicine

## 2019-01-26 ENCOUNTER — Ambulatory Visit: Payer: 59 | Admitting: Psychiatry

## 2019-01-26 DIAGNOSIS — M5441 Lumbago with sciatica, right side: Principal | ICD-10-CM

## 2019-01-26 DIAGNOSIS — G8929 Other chronic pain: Secondary | ICD-10-CM

## 2019-01-28 ENCOUNTER — Ambulatory Visit: Payer: BLUE CROSS/BLUE SHIELD | Admitting: Psychiatry

## 2019-01-28 ENCOUNTER — Other Ambulatory Visit: Payer: Self-pay

## 2019-01-28 ENCOUNTER — Encounter: Payer: Self-pay | Admitting: Psychiatry

## 2019-01-28 VITALS — BP 89/66 | HR 132 | Temp 97.8°F | Wt 174.8 lb

## 2019-01-28 DIAGNOSIS — F5105 Insomnia due to other mental disorder: Secondary | ICD-10-CM

## 2019-01-28 DIAGNOSIS — F331 Major depressive disorder, recurrent, moderate: Secondary | ICD-10-CM

## 2019-01-28 DIAGNOSIS — F410 Panic disorder [episodic paroxysmal anxiety] without agoraphobia: Secondary | ICD-10-CM | POA: Diagnosis not present

## 2019-01-28 NOTE — Progress Notes (Signed)
[facility] Progress Note  01/28/2019 5:13 PM Evelyn Bentley  MRN:  [id number]  Chief Complaint: ' I am here for follow up." Chief Complaint    Follow-up; Medication Refill      HPI: Evelyn Bentley is a 63 year old Caucasian female who is widowed, has a history of MDD, lives at [place], presented to clinic today for a follow-up visit.  Patient today reports she is doing well with regards to her mood symptoms.  She reports work is really busy with the coronavirus outbreak.  She works for a [employer] and they are working a lot to get medical supplies to where it needs to go.  Patient reports she continues to be compliant with her medications as prescribed.  Patient denies any suicidality, homicidality or perceptual disturbances.  Patient reports sleep is good.  Patient reports she continues to be in a lot of pain which could be affecting her energy level during the day.  She however reports she is following up with her providers for the same.  She has been getting injections to relieve her pain which helps.  Discussed with patient that we had asked her to follow-up in clinic sooner since we had received a referral from her primary medical doctor requesting this and also that her most recent PHQ 9 screening appeared to be high.  Patient however reports she does not know how that happened and reports she is not depressed.  She reports she does have a lot of pain which could be contributing to some of the tiredness that she feels otherwise denies any other concerns.        Visit Diagnosis:    ICD-10-CM   1. MDD (major depressive disorder), recurrent episode, moderate (HCC) F33.1    improving  2. Panic attack F41.0   3. Insomnia due to mental condition F51.05     Past Psychiatric History: I have reviewed past psychiatric history from my progress note on 02/26/2018.  Past Medical History:  Past Medical History:  Diagnosis Date  . Anxiety   . Arthritis   . Bursitis    Rt hip  .  Complication of anesthesia    nausea  . Depression   . Fibromyalgia   . GERD (gastroesophageal reflux disease)   . Hypercholesteremia   . Hypertension   . Hypothyroidism   . Thyroid disease     Past Surgical History:  Procedure Laterality Date  . ABDOMINAL HYSTERECTOMY    . CHOLECYSTECTOMY    . COLONOSCOPY WITH PROPOFOL N/A 11/13/2016   Procedure: COLONOSCOPY WITH PROPOFOL;  Surgeon: Lollie Sails, MD;  Location: [facility] ENDOSCOPY;  Service: Endoscopy;  Laterality: N/A;  . ESOPHAGOGASTRODUODENOSCOPY (EGD) WITH PROPOFOL N/A 11/13/2016   Procedure: ESOPHAGOGASTRODUODENOSCOPY (EGD) WITH PROPOFOL;  Surgeon: Lollie Sails, MD;  Location: [facility] ENDOSCOPY;  Service: Endoscopy;  Laterality: N/A;  . ESOPHAGOGASTRODUODENOSCOPY (EGD) WITH PROPOFOL N/A 12/16/2018   Procedure: ESOPHAGOGASTRODUODENOSCOPY (EGD) WITH PROPOFOL;  Surgeon: Lollie Sails, MD;  Location: [facility] ENDOSCOPY;  Service: Endoscopy;  Laterality: N/A;  . LAPAROSCOPIC HYSTERECTOMY Bilateral 02/28/2016   Procedure: HYSTERECTOMY TOTAL LAPAROSCOPIC / BSO;  Surgeon: Honor Loh Ward, MD;  Location: [facility] ORS;  Service: Gynecology;  Laterality: Bilateral;  . NECK SURGERY    . NISSEN FUNDOPLICATION    . TUBAL LIGATION    . URETEROSCOPY WITH HOLMIUM LASER LITHOTRIPSY Left 07/03/2016   Procedure: URETEROSCOPY WITH HOLMIUM LASER LITHOTRIPSY;  Surgeon: Royston Cowper, MD;  Location: [facility] ORS;  Service: Urology;  Laterality: Left;  Family Psychiatric History: Reviewed family psychiatric history from my progress note on 02/26/2018.  Family History:  Family History  Problem Relation Age of Onset  . Depression Mother   . Anxiety disorder Mother   . Breast cancer Neg Hx     Social History: Reviewed social history from my progress note on 02/26/2018. Social History   Socioeconomic History  . Marital status: Widowed    Spouse name: Not on file  . Number of children: 2  . Years of education: Not on file  . Highest education level: High  school graduate  Occupational History    Comment: fulltime  Social Needs  . Financial resource strain: Not hard at all  . Food insecurity:    Worry: Never true    Inability: Never true  . Transportation needs:    Medical: No    Non-medical: No  Tobacco Use  . Smoking status: Former Smoker    Start date: 11/14/1974    Last attempt to quit: 05/14/2014    Years since quitting: 4.7  . Smokeless tobacco: Never Used  Substance and Sexual Activity  . Alcohol use: Not Currently    Alcohol/week: 0.0 - 1.0 standard drinks    Comment: social rare  . Drug use: No  . Sexual activity: Yes    Partners: Male    Birth control/protection: Condom  Lifestyle  . Physical activity:    Days per week: 0 days    Minutes per session: 0 min  . Stress: Very much  Relationships  . Social connections:    Talks on phone: Never    Gets together: More than three times a week    Attends religious service: Never    Active member of club or organization: No    Attends meetings of clubs or organizations: Never    Relationship status: Widowed  Other Topics Concern  . Not on file  Social History Narrative  . Not on file    Allergies:  Allergies  Allergen Reactions  . Codeine Nausea Only    Metabolic Disorder Labs: No results found for: HGBA1C, MPG No results found for: PROLACTIN No results found for: CHOL, TRIG, HDL, CHOLHDL, VLDL, LDLCALC No results found for: TSH  Therapeutic Level Labs: No results found for: LITHIUM No results found for: VALPROATE No components found for:  CBMZ  Current Medications: Current Outpatient Medications  Medication Sig Dispense Refill  . buPROPion (WELLBUTRIN XL) 300 MG 24 hr tablet Take 1 tablet (300 mg total) by mouth daily. 90 tablet 1  . Cholecalciferol (VITAMIN D3) 1000 units CAPS Take 1 capsule by mouth daily.    . cloNIDine (CATAPRES) 0.1 MG tablet Take 0.1 mg by mouth at bedtime.  4  . DEXILANT 60 MG capsule Take 60 mg by mouth daily.  2  . dicyclomine  (BENTYL) 10 MG capsule Take by mouth.    . DULoxetine (CYMBALTA) 20 MG capsule Take 1 capsule (20 mg total) by mouth daily. TO BE TAKEN ALONG WITH THE 60 MG 90 capsule 1  . DULoxetine (CYMBALTA) 60 MG capsule Take 1 capsule (60 mg total) by mouth at bedtime. TO BE TAKEN ALONG WITH 20 MG 90 capsule 1  . fluticasone (FLONASE) 50 MCG/ACT nasal spray fluticasone 50 mcg/actuation nasal spray,suspension    . gabapentin (NEURONTIN) 100 MG capsule Take 100 mg by mouth 3 (three) times daily.    . hydrOXYzine (ATARAX/VISTARIL) 10 MG tablet TAKE 1-2 TABLETS (10-20 MG TOTAL) BY MOUTH 2 (TWO) TIMES DAILY AS NEEDED (  SEVERE ANXIETY SX). 360 tablet 2  . ibuprofen (ADVIL,MOTRIN) 600 MG tablet     . levothyroxine (SYNTHROID, LEVOTHROID) 137 MCG tablet TAKE ONE TABLET BY MOUTH EVERY MORNING ON AN EMPTY STOMACH.  3  . lisinopril-hydrochlorothiazide (PRINZIDE,ZESTORETIC) 10-12.5 MG tablet Take 1 tablet by mouth daily.   2  . nitrofurantoin (MACRODANTIN) 100 MG capsule Take 100 mg by mouth 2 (two) times daily.  0  . phenazopyridine (PYRIDIUM) 200 MG tablet     . predniSONE (DELTASONE) 10 MG tablet Take 6 tabs (60mg ) by mouth followed by 5 tabs (50mg ), then 4 tabs (40mg ), then 3 tabs (30mg ), then 2 tabs (20mg ), then 1 tab (10mg )    . rosuvastatin (CRESTOR) 20 MG tablet Take 20 mg by mouth daily.    . sucralfate (CARAFATE) 1 g tablet TAKE 1 TABLET (1 G TOTAL) BY MOUTH 4 (FOUR) TIMES DAILY BEFORE MEALS AND NIGHTLY    . temazepam (RESTORIL) 22.5 MG capsule Take 1 capsule (22.5 mg total) by mouth at bedtime as needed for sleep. 30 capsule 3  . Vitamin D, Ergocalciferol, (DRISDOL) 50000 units CAPS capsule TAKE 1 CAP BY MOUTH ONCE A WEEK  3  . VENTOLIN HFA 108 (90 Base) MCG/ACT inhaler      No current facility-administered medications for this visit.      Musculoskeletal: Strength & Muscle Tone: within normal limits Gait & Station: normal Patient leans: N/A  Psychiatric Specialty Exam: Review of Systems   Musculoskeletal: Positive for back pain and joint pain.  All other systems reviewed and are negative.   Blood pressure (!) 89/66, pulse (!) 132, temperature 97.8 F (36.6 C), temperature source Oral, weight 174 lb 12.8 oz (79.3 kg).Body mass index is 34.14 kg/m.  General Appearance: Casual  Eye Contact:  Fair  Speech:  Clear and Coherent  Volume:  Normal  Mood:  Euthymic  Affect:  Congruent  Thought Process:  Goal Directed and Descriptions of Associations: Intact  Orientation:  Full (Time, Place, and Person)  Thought Content: Logical   Suicidal Thoughts:  No  Homicidal Thoughts:  No  Memory:  Immediate;   Fair Recent;   Fair Remote;   Fair  Judgement:  Fair  Insight:  Fair  Psychomotor Activity:  Normal  Concentration:  Concentration: Fair and Attention Span: Fair  Recall:  [employer] of Knowledge: Fair  Language: Fair  Akathisia:  No  Handed:  Right  AIMS (if indicated): na  Assets:  Communication Skills Desire for Improvement Social Support  ADL's:  Intact  Cognition: WNL  Sleep:  Fair   Screenings:   Assessment and Plan: Evelyn Bentley is a 63 year old Caucasian female, widowed, has a history of MDD, fibromyalgia, memory changes, presented to clinic today for a follow-up visit.  Patient with several psychosocial stressors including several deaths in the family, son died when he was 24 years old due to neurological problems, son who had muscular dystrophy died at the age of 10, her husband passed away soon after that.  Patient is currently doing well on the current medication regimen.  Plan as noted below.  Plan For depression-stable Cymbalta 80 mg p.o. daily Wellbutrin XL 300 mg p.o. daily PHQ 9 done today- 0  For panic attacks-improving Cymbalta 80 mg p.o. daily Wellbutrin XL 300 mg p.o. daily. Continue hydroxyzine 10 to 20 mg p.o. daily PRN for severe anxiety attacks  For insomnia-improving Temazepam 22.5 mg p.o. nightly  For cognitive changes-improving Patient  will continue vitamin B12 replacement.  Discussed patient's blood  pressure and heart rate today, she denies any symptoms.  She will continue to follow-up with her primary medical doctor.   Follow-up in clinic in 2 months or sooner if needed.  I have spent atleast 15 minutes face to face with patient today. More than 50 % of the time was spent for psychoeducation and supportive psychotherapy and care coordination.  This note was generated in part or whole with voice recognition software. Voice recognition is usually quite accurate but there are transcription errors that can and very often do occur. I apologize for any typographical errors that were not detected and corrected.          Ursula Alert, MD 01/29/2019, 8:53 AM

## 2019-01-29 ENCOUNTER — Encounter: Payer: Self-pay | Admitting: Psychiatry

## 2019-02-01 ENCOUNTER — Ambulatory Visit: Admission: RE | Admit: 2019-02-01 | Payer: BLUE CROSS/BLUE SHIELD | Source: Ambulatory Visit

## 2019-02-05 DIAGNOSIS — G5690 Unspecified mononeuropathy of unspecified upper limb: Secondary | ICD-10-CM | POA: Insufficient documentation

## 2019-03-07 ENCOUNTER — Other Ambulatory Visit: Payer: Self-pay

## 2019-03-07 ENCOUNTER — Ambulatory Visit
Admission: RE | Admit: 2019-03-07 | Discharge: 2019-03-07 | Disposition: A | Discharge status: Home / Self Care | Payer: BLUE CROSS/BLUE SHIELD | Source: Ambulatory Visit | Attending: Sports Medicine | Admitting: Sports Medicine

## 2019-03-07 DIAGNOSIS — M5441 Lumbago with sciatica, right side: Secondary | ICD-10-CM | POA: Diagnosis not present

## 2019-03-07 DIAGNOSIS — G8929 Other chronic pain: Secondary | ICD-10-CM | POA: Diagnosis present

## 2019-03-10 ENCOUNTER — Other Ambulatory Visit: Payer: Self-pay | Admitting: Psychiatry

## 2019-03-10 DIAGNOSIS — F410 Panic disorder [episodic paroxysmal anxiety] without agoraphobia: Secondary | ICD-10-CM

## 2019-03-10 DIAGNOSIS — F331 Major depressive disorder, recurrent, moderate: Secondary | ICD-10-CM

## 2019-03-19 ENCOUNTER — Ambulatory Visit: Payer: 59 | Admitting: Psychiatry

## 2019-03-31 ENCOUNTER — Ambulatory Visit (INDEPENDENT_AMBULATORY_CARE_PROVIDER_SITE_OTHER): Payer: BLUE CROSS/BLUE SHIELD | Admitting: Psychiatry

## 2019-03-31 ENCOUNTER — Other Ambulatory Visit: Payer: Self-pay

## 2019-03-31 ENCOUNTER — Encounter: Payer: Self-pay | Admitting: Psychiatry

## 2019-03-31 DIAGNOSIS — F410 Panic disorder [episodic paroxysmal anxiety] without agoraphobia: Secondary | ICD-10-CM

## 2019-03-31 DIAGNOSIS — F5105 Insomnia due to other mental disorder: Secondary | ICD-10-CM

## 2019-03-31 DIAGNOSIS — F331 Major depressive disorder, recurrent, moderate: Secondary | ICD-10-CM | POA: Diagnosis not present

## 2019-03-31 MED ORDER — BUPROPION HCL ER (XL) 300 MG PO TB24: 300.0000 mg | ORAL_TABLET | Freq: Every day | ORAL | 1 refills | Status: DC

## 2019-03-31 MED ORDER — DULOXETINE HCL 60 MG PO CPEP: 60.0000 mg | ORAL_CAPSULE | Freq: Every day | ORAL | 1 refills | Status: DC

## 2019-03-31 NOTE — Progress Notes (Signed)
Virtual Visit via Video Note at [id number]  I connected with Evelyn Bentley on 03/31/19 at  4:45 PM EDT by a video enabled telemedicine application and verified that I am speaking with the correct person using two identifiers.   I discussed the limitations of evaluation and management by telemedicine and the availability of in person appointments. The patient expressed understanding and agreed to proceed.    I discussed the assessment and treatment plan with the patient. The patient was provided an opportunity to ask questions and all were answered. The patient agreed with the plan and demonstrated an understanding of the instructions.   The patient was advised to call back or seek an in-person evaluation if the symptoms worsen or if the condition fails to improve as anticipated.  [place] MD OP Progress Note  03/31/2019 4:32 PM Evelyn Bentley  MRN:  [id number]  Chief Complaint:  Chief Complaint    Follow-up     HPI: Evelyn Bentley is a 63 year old Caucasian female who is widowed, has a history of MDD, lives at [facility], was evaluated today by video consult.  Patient reports she continues to report to work daily eventhough the Covid 19 restrictions are in place since her work is considered essential. Patient reports she has been coping OK so far.  She reports anxiety symptoms as improving on the current medications. Patient denies any panic attacks.  She is sleeping well on Temazepam. She denies side effects .  She reports appetite as fair. She denies any problems with her focus and is able to do her work well.  She reports memory changes as improving and she continues to take Vitamin b12 daily.  She denies any suicidality, homicidality or perceptual disturbances.    Visit Diagnosis:    ICD-10-CM   1. MDD (major depressive disorder), recurrent episode, moderate (HCC) F33.1 DULoxetine (CYMBALTA) 60 MG capsule   IN EARLY REMISSION  2. Panic attack F41.0   3. Insomnia due to mental  condition F51.05     Past Psychiatric History: Reviewed past psychiatric history from my progress note on 02/26/2018.  Past Medical History:  Past Medical History:  Diagnosis Date  . Anxiety   . Arthritis   . Bursitis    Rt hip  . Complication of anesthesia    nausea  . Depression   . Fibromyalgia   . GERD (gastroesophageal reflux disease)   . Hypercholesteremia   . Hypertension   . Hypothyroidism   . Thyroid disease     Past Surgical History:  Procedure Laterality Date  . ABDOMINAL HYSTERECTOMY    . CHOLECYSTECTOMY    . COLONOSCOPY WITH PROPOFOL N/A 11/13/2016   Procedure: COLONOSCOPY WITH PROPOFOL;  Surgeon: Lollie Sails, MD;  Location: [facility] ENDOSCOPY;  Service: Endoscopy;  Laterality: N/A;  . ESOPHAGOGASTRODUODENOSCOPY (EGD) WITH PROPOFOL N/A 11/13/2016   Procedure: ESOPHAGOGASTRODUODENOSCOPY (EGD) WITH PROPOFOL;  Surgeon: Lollie Sails, MD;  Location: [facility] ENDOSCOPY;  Service: Endoscopy;  Laterality: N/A;  . ESOPHAGOGASTRODUODENOSCOPY (EGD) WITH PROPOFOL N/A 12/16/2018   Procedure: ESOPHAGOGASTRODUODENOSCOPY (EGD) WITH PROPOFOL;  Surgeon: Lollie Sails, MD;  Location: [facility] ENDOSCOPY;  Service: Endoscopy;  Laterality: N/A;  . LAPAROSCOPIC HYSTERECTOMY Bilateral 02/28/2016   Procedure: HYSTERECTOMY TOTAL LAPAROSCOPIC / BSO;  Surgeon: Honor Loh Ward, MD;  Location: [facility] ORS;  Service: Gynecology;  Laterality: Bilateral;  . NECK SURGERY    . NISSEN FUNDOPLICATION    . TUBAL LIGATION    . URETEROSCOPY WITH HOLMIUM LASER LITHOTRIPSY Left 07/03/2016   Procedure: URETEROSCOPY WITH  HOLMIUM LASER LITHOTRIPSY;  Surgeon: Royston Cowper, MD;  Location: [facility] ORS;  Service: Urology;  Laterality: Left;    Family Psychiatric History: I have reviewed family psychiatric history from my progress note on 02/26/2018.  Family History:  Family History  Problem Relation Age of Onset  . Depression Mother   . Anxiety disorder Mother   . Breast cancer Neg Hx     Social History:  Reviewed social history from my progress note on 02/26/2018. Social History   Socioeconomic History  . Marital status: Widowed    Spouse name: Not on file  . Number of children: 2  . Years of education: Not on file  . Highest education level: High school graduate  Occupational History    Comment: fulltime  Social Needs  . Financial resource strain: Not hard at all  . Food insecurity:    Worry: Never true    Inability: Never true  . Transportation needs:    Medical: No    Non-medical: No  Tobacco Use  . Smoking status: Former Smoker    Start date: 11/14/1974    Last attempt to quit: 05/14/2014    Years since quitting: 4.8  . Smokeless tobacco: Never Used  Substance and Sexual Activity  . Alcohol use: Not Currently    Alcohol/week: 0.0 - 1.0 standard drinks    Comment: social rare  . Drug use: No  . Sexual activity: Yes    Partners: Male    Birth control/protection: Condom  Lifestyle  . Physical activity:    Days per week: 0 days    Minutes per session: 0 min  . Stress: Very much  Relationships  . Social connections:    Talks on phone: Never    Gets together: More than three times a week    Attends religious service: Never    Active member of club or organization: No    Attends meetings of clubs or organizations: Never    Relationship status: Widowed  Other Topics Concern  . Not on file  Social History Narrative  . Not on file    Allergies:  Allergies  Allergen Reactions  . Codeine Nausea Only    Metabolic Disorder Labs: No results found for: HGBA1C, MPG No results found for: PROLACTIN No results found for: CHOL, TRIG, HDL, CHOLHDL, VLDL, LDLCALC No results found for: TSH  Therapeutic Level Labs: No results found for: LITHIUM No results found for: VALPROATE No components found for:  CBMZ  Current Medications: Current Outpatient Medications  Medication Sig Dispense Refill  . buPROPion (WELLBUTRIN XL) 300 MG 24 hr tablet Take 1 tablet (300 mg total) by  mouth daily. 90 tablet 1  . Cholecalciferol (VITAMIN D3) 1000 units CAPS Take 1 capsule by mouth daily.    . cloNIDine (CATAPRES) 0.1 MG tablet Take 0.1 mg by mouth at bedtime.  4  . DEXILANT 60 MG capsule Take 60 mg by mouth daily.  2  . dicyclomine (BENTYL) 10 MG capsule Take by mouth.    . DULoxetine (CYMBALTA) 20 MG capsule TAKE 1 CAPSULE (20 MG TOTAL) BY MOUTH DAILY. TO BE TAKEN ALONG WITH THE 60 MG 90 capsule 1  . DULoxetine (CYMBALTA) 60 MG capsule Take 1 capsule (60 mg total) by mouth at bedtime. TO BE TAKEN ALONG WITH 20 MG 90 capsule 1  . fluticasone (FLONASE) 50 MCG/ACT nasal spray fluticasone 50 mcg/actuation nasal spray,suspension    . gabapentin (NEURONTIN) 100 MG capsule Take 100 mg by mouth 3 (three) times  daily.    . gabapentin (NEURONTIN) 300 MG capsule TAKE 300 MG TWICE A DAY FOR ONE WEEK THEN INCREASE TO 300 MG THREE TIMES A DAY    . hydrOXYzine (ATARAX/VISTARIL) 10 MG tablet TAKE 1-2 TABLETS (10-20 MG TOTAL) BY MOUTH 2 (TWO) TIMES DAILY AS NEEDED (SEVERE ANXIETY SX). 360 tablet 2  . ibuprofen (ADVIL,MOTRIN) 600 MG tablet     . levothyroxine (SYNTHROID, LEVOTHROID) 137 MCG tablet TAKE ONE TABLET BY MOUTH EVERY MORNING ON AN EMPTY STOMACH.  3  . lisinopril-hydrochlorothiazide (PRINZIDE,ZESTORETIC) 10-12.5 MG tablet Take 1 tablet by mouth daily.   2  . nitrofurantoin (MACRODANTIN) 100 MG capsule Take 100 mg by mouth 2 (two) times daily.  0  . nitrofurantoin (MACRODANTIN) 50 MG capsule Take by mouth daily.    . phenazopyridine (PYRIDIUM) 200 MG tablet     . predniSONE (DELTASONE) 10 MG tablet Take 6 tabs (60mg ) by mouth followed by 5 tabs (50mg ), then 4 tabs (40mg ), then 3 tabs (30mg ), then 2 tabs (20mg ), then 1 tab (10mg )    . rosuvastatin (CRESTOR) 20 MG tablet Take 20 mg by mouth daily.    . sucralfate (CARAFATE) 1 g tablet TAKE 1 TABLET (1 G TOTAL) BY MOUTH 4 (FOUR) TIMES DAILY BEFORE MEALS AND NIGHTLY    . temazepam (RESTORIL) 22.5 MG capsule Take 1 capsule (22.5 mg total)  by mouth at bedtime as needed for sleep. 30 capsule 3  . VENTOLIN HFA 108 (90 Base) MCG/ACT inhaler     . Vitamin D, Ergocalciferol, (DRISDOL) 50000 units CAPS capsule TAKE 1 CAP BY MOUTH ONCE A WEEK  3   No current facility-administered medications for this visit.      Musculoskeletal: Strength & Muscle Tone: within normal limits Gait & Station: normal Patient leans: N/A  Psychiatric Specialty Exam: Review of Systems  Psychiatric/Behavioral: The patient is nervous/anxious.   All other systems reviewed and are negative.   There were no vitals taken for this visit.There is no height or weight on file to calculate BMI.  General Appearance: Casual  Eye Contact:  Fair  Speech:  Clear and Coherent  Volume:  Normal  Mood:  Anxious improving  Affect:  Congruent  Thought Process:  Goal Directed and Descriptions of Associations: Intact  Orientation:  Full (Time, Place, and Person)  Thought Content: Logical   Suicidal Thoughts:  No  Homicidal Thoughts:  No  Memory:  Immediate;   Fair Recent;   Fair Remote;   Fair  Judgement:  Fair  Insight:  Fair  Psychomotor Activity:  Normal  Concentration:  Concentration: Fair and Attention Span: Fair  Recall:  [employer] of Knowledge: Fair  Language: Fair  Akathisia:  No  Handed:  Right  AIMS (if indicated): denies tremors, rigidity  Assets:  Communication Skills Desire for Improvement Social Support  ADL's:  Intact  Cognition: WNL  Sleep:  Fair   Screenings:   Assessment and Plan: Evelyn Bentley is a 63 year old Caucasian female, widowed, has a history of MDD, fibromyalgia, memory changes was evaluated today.  Patient with several psychosocial stressors including several deaths in the family, son died when he was 56 years old due to neurological problems, son who had muscular dystrophy died at the age of 41, husband passed away soon after that.  Patient is currently doing well on the current medication regimen.Continue plan as noted  below.  Plan  For depression-stable Cymbalta 80 mg p.o. daily Wellbutrin extended release 300 mg p.o. daily  For panic attacks-  stable Cymbalta 80 mg p.o. daily Wellbutrin XL 300 mg p.o. daily Hydroxyzine 10 to 20 mg p.o. daily PRN for severe anxiety attacks  For insomnia- stable Temazepam 22.5 mg p.o. nightly  For cognitive changes-improving  Patient will continue vitamin B12 replacement.  Follow-up in clinic in 2 to 3 months or sooner if needed.Appointment - August 19 th , 4:30 PM  I have spent atleast 15 minutes non face to face with patient today. More than 50 % of the time was spent for psychoeducation and supportive psychotherapy and care coordination.  This note was generated in part or whole with voice recognition software. Voice recognition is usually quite accurate but there are transcription errors that can and very often do occur. I apologize for any typographical errors that were not detected and corrected.       Ursula Alert, MD 03/31/2019, 4:32 PM

## 2019-05-31 ENCOUNTER — Other Ambulatory Visit: Payer: Self-pay | Admitting: Psychiatry

## 2019-06-03 ENCOUNTER — Other Ambulatory Visit (HOSPITAL_COMMUNITY): Payer: Self-pay | Admitting: Psychiatry

## 2019-06-03 MED ORDER — TEMAZEPAM 22.5 MG PO CAPS: 22.5000 mg | ORAL_CAPSULE | Freq: Every evening | ORAL | 0 refills | Status: DC | PRN

## 2019-06-03 NOTE — Telephone Encounter (Signed)
pt called states seh needs a refill on her temazepam

## 2019-06-03 NOTE — Telephone Encounter (Signed)
ordered

## 2019-06-06 ENCOUNTER — Other Ambulatory Visit: Payer: Self-pay | Admitting: Psychiatry

## 2019-06-06 DIAGNOSIS — F410 Panic disorder [episodic paroxysmal anxiety] without agoraphobia: Secondary | ICD-10-CM

## 2019-06-06 DIAGNOSIS — F331 Major depressive disorder, recurrent, moderate: Secondary | ICD-10-CM

## 2019-06-16 ENCOUNTER — Ambulatory Visit
Admission: RE | Admit: 2019-06-16 | Discharge: 2019-06-16 | Disposition: A | Discharge status: Home / Self Care | Payer: BC Managed Care – PPO | Source: Ambulatory Visit | Attending: Family | Admitting: Family

## 2019-06-16 ENCOUNTER — Other Ambulatory Visit: Payer: Self-pay

## 2019-06-16 DIAGNOSIS — Z1231 Encounter for screening mammogram for malignant neoplasm of breast: Secondary | ICD-10-CM | POA: Insufficient documentation

## 2019-07-01 ENCOUNTER — Ambulatory Visit (INDEPENDENT_AMBULATORY_CARE_PROVIDER_SITE_OTHER): Payer: BLUE CROSS/BLUE SHIELD | Admitting: Psychiatry

## 2019-07-01 ENCOUNTER — Encounter: Payer: Self-pay | Admitting: Psychiatry

## 2019-07-01 ENCOUNTER — Other Ambulatory Visit: Payer: Self-pay

## 2019-07-01 DIAGNOSIS — F410 Panic disorder [episodic paroxysmal anxiety] without agoraphobia: Secondary | ICD-10-CM

## 2019-07-01 DIAGNOSIS — F5105 Insomnia due to other mental disorder: Secondary | ICD-10-CM

## 2019-07-01 DIAGNOSIS — F3341 Major depressive disorder, recurrent, in partial remission: Secondary | ICD-10-CM

## 2019-07-01 HISTORY — DX: Insomnia due to other mental disorder: F51.05

## 2019-07-01 MED ORDER — DULOXETINE HCL 60 MG PO CPEP: 60.0000 mg | ORAL_CAPSULE | Freq: Every day | ORAL | 1 refills | Status: DC

## 2019-07-01 MED ORDER — TEMAZEPAM 22.5 MG PO CAPS: 22.5000 mg | ORAL_CAPSULE | Freq: Every evening | ORAL | 5 refills | Status: DC | PRN

## 2019-07-01 MED ORDER — BUPROPION HCL ER (XL) 300 MG PO TB24: 300.0000 mg | ORAL_TABLET | Freq: Every day | ORAL | 1 refills | Status: DC

## 2019-07-01 MED ORDER — DULOXETINE HCL 20 MG PO CPEP: 20.0000 mg | ORAL_CAPSULE | Freq: Every day | ORAL | 1 refills | Status: DC

## 2019-07-01 NOTE — Progress Notes (Signed)
Virtual Visit via Video Note  I connected with Evelyn Bentley on 07/01/19 at  4:30 PM EDT by a video enabled telemedicine application and verified that I am speaking with the correct person using two identifiers.   I discussed the limitations of evaluation and management by telemedicine and the availability of in person appointments. The patient expressed understanding and agreed to proceed.   I discussed the assessment and treatment plan with the patient. The patient was provided an opportunity to ask questions and all were answered. The patient agreed with the plan and demonstrated an understanding of the instructions.   The patient was advised to call back or seek an in-person evaluation if the symptoms worsen or if the condition fails to improve as anticipated.  Kinta MD  OP Progress Note  07/01/2019 6:37 PM Evelyn Bentley  MRN:  [id number]  Chief Complaint:  Chief Complaint    Follow-up     HPI: [name] is a 63 year old Caucasian female who is widowed, has a history of MDD, lives at [facility], was evaluated by telemedicine today.  Patient was offered video consult for due to connection problem it had to be changed to a phone call during the session.  She reports work has been very busy due to the COVID-19 outbreak.  She reports her position at work recently changed.  She reports ever since that she has noticed some problems with her memory.  She reports she does not know if it is due to learning a lot of new things and having to manage new situations.  She reports she otherwise has been doing okay.  She has been compliant on her medications.  She reports sleep is good on the temazepam.  She reports her thyroid medication was recently changed and she has upcoming appointment for repeating her thyroid panel.  She denies any other concerns today.   Visit Diagnosis:    ICD-10-CM   1. MDD (major depressive disorder), recurrent, in partial remission (HCC)  F33.41 buPROPion  (WELLBUTRIN XL) 300 MG 24 hr tablet  2. Panic attack  F41.0 DULoxetine (CYMBALTA) 20 MG capsule    DULoxetine (CYMBALTA) 60 MG capsule  3. Insomnia due to mental condition  F51.05 temazepam (RESTORIL) 22.5 MG capsule    Past Psychiatric History: I have reviewed past psychiatric history from my progress note on 02/26/2018.  Past Medical History:  Past Medical History:  Diagnosis Date  . Anxiety   . Arthritis   . Bursitis    Rt hip  . Complication of anesthesia    nausea  . Depression   . Fibromyalgia   . GERD (gastroesophageal reflux disease)   . Hypercholesteremia   . Hypertension   . Hypothyroidism   . Thyroid disease     Past Surgical History:  Procedure Laterality Date  . ABDOMINAL HYSTERECTOMY    . CHOLECYSTECTOMY    . COLONOSCOPY WITH PROPOFOL N/A 11/13/2016   Procedure: COLONOSCOPY WITH PROPOFOL;  Surgeon: Lollie Sails, MD;  Location: [facility] ENDOSCOPY;  Service: Endoscopy;  Laterality: N/A;  . ESOPHAGOGASTRODUODENOSCOPY (EGD) WITH PROPOFOL N/A 11/13/2016   Procedure: ESOPHAGOGASTRODUODENOSCOPY (EGD) WITH PROPOFOL;  Surgeon: Lollie Sails, MD;  Location: [facility] ENDOSCOPY;  Service: Endoscopy;  Laterality: N/A;  . ESOPHAGOGASTRODUODENOSCOPY (EGD) WITH PROPOFOL N/A 12/16/2018   Procedure: ESOPHAGOGASTRODUODENOSCOPY (EGD) WITH PROPOFOL;  Surgeon: Lollie Sails, MD;  Location: [facility] ENDOSCOPY;  Service: Endoscopy;  Laterality: N/A;  . LAPAROSCOPIC HYSTERECTOMY Bilateral 02/28/2016   Procedure: HYSTERECTOMY TOTAL LAPAROSCOPIC / BSO;  Surgeon: Vikki Ports  C Ward, MD;  Location: [facility] ORS;  Service: Gynecology;  Laterality: Bilateral;  . NECK SURGERY    . NISSEN FUNDOPLICATION    . TUBAL LIGATION    . URETEROSCOPY WITH HOLMIUM LASER LITHOTRIPSY Left 07/03/2016   Procedure: URETEROSCOPY WITH HOLMIUM LASER LITHOTRIPSY;  Surgeon: Royston Cowper, MD;  Location: [facility] ORS;  Service: Urology;  Laterality: Left;    Family Psychiatric History: I have reviewed family psychiatric history  from my progress note on 02/26/2018.  Family History:  Family History  Problem Relation Age of Onset  . Depression Mother   . Anxiety disorder Mother   . Breast cancer Neg Hx     Social History: I have reviewed social history from my progress note on 02/26/2018. Social History   Socioeconomic History  . Marital status: Widowed    Spouse name: Not on file  . Number of children: 2  . Years of education: Not on file  . Highest education level: High school graduate  Occupational History    Comment: fulltime  Social Needs  . Financial resource strain: Not hard at all  . Food insecurity    Worry: Never true    Inability: Never true  . Transportation needs    Medical: No    Non-medical: No  Tobacco Use  . Smoking status: Former Smoker    Start date: 11/14/1974    Quit date: 05/14/2014    Years since quitting: 5.1  . Smokeless tobacco: Never Used  Substance and Sexual Activity  . Alcohol use: Not Currently    Alcohol/week: 0.0 - 1.0 standard drinks    Comment: social rare  . Drug use: No  . Sexual activity: Yes    Partners: Male    Birth control/protection: Condom  Lifestyle  . Physical activity    Days per week: 0 days    Minutes per session: 0 min  . Stress: Very much  Relationships  . Social Herbalist on phone: Never    Gets together: More than three times a week    Attends religious service: Never    Active member of club or organization: No    Attends meetings of clubs or organizations: Never    Relationship status: Widowed  Other Topics Concern  . Not on file  Social History Narrative  . Not on file    Allergies:  Allergies  Allergen Reactions  . Codeine Nausea Only    Metabolic Disorder Labs: No results found for: HGBA1C, MPG No results found for: PROLACTIN No results found for: CHOL, TRIG, HDL, CHOLHDL, VLDL, LDLCALC No results found for: TSH  Therapeutic Level Labs: No results found for: LITHIUM No results found for: VALPROATE No  components found for:  CBMZ  Current Medications: Current Outpatient Medications  Medication Sig Dispense Refill  . buPROPion (WELLBUTRIN XL) 300 MG 24 hr tablet Take 1 tablet (300 mg total) by mouth daily. 90 tablet 1  . Cholecalciferol (VITAMIN D3) 1000 units CAPS Take 1 capsule by mouth daily.    . cloNIDine (CATAPRES) 0.1 MG tablet Take 0.1 mg by mouth at bedtime.  4  . DEXILANT 60 MG capsule Take 60 mg by mouth daily.  2  . dicyclomine (BENTYL) 10 MG capsule Take by mouth.    . DULoxetine (CYMBALTA) 20 MG capsule Take 1 capsule (20 mg total) by mouth daily. TO BE TAKEN ALONG WITH THE 60 MG 90 capsule 1  . DULoxetine (CYMBALTA) 60 MG capsule Take 1 capsule (60 mg  total) by mouth at bedtime. TO BE TAKEN ALONG WITH 20 MG 90 capsule 1  . fluticasone (FLONASE) 50 MCG/ACT nasal spray fluticasone 50 mcg/actuation nasal spray,suspension    . gabapentin (NEURONTIN) 100 MG capsule Take 100 mg by mouth 3 (three) times daily.    Marland Kitchen gabapentin (NEURONTIN) 300 MG capsule TAKE 300 MG TWICE A DAY FOR ONE WEEK THEN INCREASE TO 300 MG THREE TIMES A DAY    . hydrOXYzine (ATARAX/VISTARIL) 10 MG tablet TAKE 1-2 TABLETS (10-20 MG TOTAL) BY MOUTH 2 (TWO) TIMES DAILY AS NEEDED (SEVERE ANXIETY SX). 120 tablet 8  . ibuprofen (ADVIL,MOTRIN) 600 MG tablet     . levothyroxine (SYNTHROID) 100 MCG tablet Take 100 mcg by mouth daily.    Marland Kitchen levothyroxine (SYNTHROID, LEVOTHROID) 137 MCG tablet TAKE ONE TABLET BY MOUTH EVERY MORNING ON AN EMPTY STOMACH.  3  . lisinopril-hydrochlorothiazide (PRINZIDE,ZESTORETIC) 10-12.5 MG tablet Take 1 tablet by mouth daily.   2  . nitrofurantoin (MACRODANTIN) 100 MG capsule Take 100 mg by mouth 2 (two) times daily.  0  . nitrofurantoin (MACRODANTIN) 50 MG capsule Take by mouth daily.    . phenazopyridine (PYRIDIUM) 200 MG tablet     . predniSONE (DELTASONE) 10 MG tablet Take 6 tabs (60mg ) by mouth followed by 5 tabs (50mg ), then 4 tabs (40mg ), then 3 tabs (30mg ), then 2 tabs (20mg ), then 1  tab (10mg )    . rosuvastatin (CRESTOR) 20 MG tablet Take 20 mg by mouth daily.    . sucralfate (CARAFATE) 1 g tablet TAKE 1 TABLET (1 G TOTAL) BY MOUTH 4 (FOUR) TIMES DAILY BEFORE MEALS AND NIGHTLY    . temazepam (RESTORIL) 22.5 MG capsule Take 1 capsule (22.5 mg total) by mouth at bedtime as needed for sleep. 30 capsule 5  . VENTOLIN HFA 108 (90 Base) MCG/ACT inhaler     . Vitamin D, Ergocalciferol, (DRISDOL) 50000 units CAPS capsule TAKE 1 CAP BY MOUTH ONCE A WEEK  3   No current facility-administered medications for this visit.      Musculoskeletal: Strength & Muscle Tone: UTA Gait & Station: UTA Patient leans: N/A  Psychiatric Specialty Exam: Review of Systems  Psychiatric/Behavioral: The patient is nervous/anxious.   All other systems reviewed and are negative.   There were no vitals taken for this visit.There is no height or weight on file to calculate BMI.  General Appearance: UTA  Eye Contact:  UTA  Speech:  Normal Rate  Volume:  Normal  Mood:  Anxious  Affect:  UTA  Thought Process:  Goal Directed and Descriptions of Associations: Intact  Orientation:  Full (Time, Place, and Person)  Thought Content: Logical   Suicidal Thoughts:  No  Homicidal Thoughts:  No  Memory:  Immediate;   Fair Recent;   Fair Remote;   Fair  Judgement:  Fair  Insight:  Fair  Psychomotor Activity:  UTA  Concentration:  Concentration: Fair and Attention Span: Fair  Recall:  [employer] of Knowledge: Fair  Language: Fair  Akathisia:  No  Handed:  Right  AIMS (if indicated): Denies tremors, rigidity  Assets:  Communication Skills Desire for Improvement Housing Social Support  ADL's:  Intact  Cognition: WNL  Sleep:  Fair   Screenings:   Assessment and Plan: Meaghen is a 63 year old Caucasian female, widowed, has a history of MDD, fibromyalgia, memory changes was evaluated by telemedicine today.  Patient with several psychosocial stressors including several deaths in the family, her  son died when he was  52 years old due to neurological problems, son who had muscular dystrophy died at the age of 67, husband passed away soon after that.  Patient also has current psychosocial stressors of COVID-19 outbreak and changes at workplace.  She does report some memory issues.  Discussed plan as noted below.  Plan Depression-stable Cymbalta 80 mg p.o. daily Wellbutrin extended release 300 mg p.o. daily  Panic attacks-stable Cymbalta 80 mg p.o. daily Hydroxyzine 10 to 20 mg p.o. daily PRN for severe anxiety attacks  Insomnia-stable Temazepam 22.5 mg p.o. nightly  Cognitive changes-unstable She reports her vitamin B12 levels came back within normal limits and she is no longer on it. She however has upcoming thyroid panel repeat with her PMD.  Discussed with patient to sign a consent so that we can obtain her most recent labs.  She will monitor her symptoms closely and let writer know.  May need referral for neurology if she continues to struggle.  Follow-up in clinic in 2 to 3 months or sooner if needed.  October 13 at 4 PM  I have spent atleast 15 minutes non face to face with patient today. More than 50 % of the time was spent for psychoeducation and supportive psychotherapy and care coordination. This note was generated in part or whole with voice recognition software. Voice recognition is usually quite accurate but there are transcription errors that can and very often do occur. I apologize for any typographical errors that were not detected and corrected.       Ursula Alert, MD 07/01/2019, 6:37 PM

## 2019-07-07 ENCOUNTER — Other Ambulatory Visit: Payer: Self-pay | Admitting: Psychiatry

## 2019-07-07 DIAGNOSIS — F331 Major depressive disorder, recurrent, moderate: Secondary | ICD-10-CM

## 2019-07-07 DIAGNOSIS — F410 Panic disorder [episodic paroxysmal anxiety] without agoraphobia: Secondary | ICD-10-CM

## 2019-07-21 DIAGNOSIS — G5603 Carpal tunnel syndrome, bilateral upper limbs: Secondary | ICD-10-CM | POA: Insufficient documentation

## 2019-07-21 DIAGNOSIS — G629 Polyneuropathy, unspecified: Secondary | ICD-10-CM | POA: Insufficient documentation

## 2019-07-21 DIAGNOSIS — G608 Other hereditary and idiopathic neuropathies: Secondary | ICD-10-CM | POA: Insufficient documentation

## 2019-08-13 ENCOUNTER — Encounter: Payer: Self-pay | Admitting: Podiatry

## 2019-08-13 ENCOUNTER — Other Ambulatory Visit: Payer: Self-pay

## 2019-08-13 ENCOUNTER — Ambulatory Visit: Payer: BC Managed Care – PPO | Admitting: Podiatry

## 2019-08-13 DIAGNOSIS — L603 Nail dystrophy: Secondary | ICD-10-CM | POA: Diagnosis not present

## 2019-08-13 DIAGNOSIS — M79674 Pain in right toe(s): Secondary | ICD-10-CM | POA: Diagnosis not present

## 2019-08-13 DIAGNOSIS — B351 Tinea unguium: Secondary | ICD-10-CM | POA: Diagnosis not present

## 2019-08-13 DIAGNOSIS — L84 Corns and callosities: Secondary | ICD-10-CM | POA: Diagnosis not present

## 2019-08-13 DIAGNOSIS — M79675 Pain in left toe(s): Secondary | ICD-10-CM | POA: Diagnosis not present

## 2019-08-13 NOTE — Progress Notes (Signed)
This patient presents the office for of her painful feet of 2 foot concerns.  She says that her big toes on both feet continue to grow thick but do not grow long.  This makes the toenails painful walking and wearing her shoes.  She also says that she has developed a painful skin lesion under the third toe of the left foot.  She says this is becoming increasingly larger and more painful when she walks.  She says she has provided no self treatment or sought any professional help.  She presents to  the office for evaluation and treatment of her feet. Patient says she has fibromyalgia.  General Appearance  Alert, conversant and in no acute stress.  Vascular  Dorsalis pedis and posterior tibial  pulses are palpable  bilaterally.  Capillary return is within normal limits  bilaterally. Temperature is within normal limits  bilaterally.  Neurologic  Senn-Weinstein monofilament wire test within normal limits  bilaterally. Muscle power within normal limits bilaterally.  Nails Thick disfigured discolored nails with subungual debris hallux nails   bilaterally. No evidence of bacterial infection or drainage bilaterally.  Orthopedic  No limitations of motion  feet .  No crepitus or effusions noted.  No bony pathology or digital deformities noted.HAV 1st MPJ  B/L.    Skin  normotropic skin with no porokeratosis noted bilaterally.  No signs of infections or ulcers noted.  Callus sub 3rd toe left foot.  Nail Dystrophy/Onychomycosis  Hallux  B/L.  Corn 3rd toe left foot.  IE.  Debride corn with 15 blade.  Debride hallux nails  B/l.  RTC prn.   Gardiner Barefoot DPM

## 2019-08-21 ENCOUNTER — Encounter: Payer: Self-pay | Admitting: Psychiatry

## 2019-08-21 ENCOUNTER — Other Ambulatory Visit: Payer: Self-pay

## 2019-08-21 ENCOUNTER — Ambulatory Visit (INDEPENDENT_AMBULATORY_CARE_PROVIDER_SITE_OTHER): Payer: BC Managed Care – PPO | Admitting: Psychiatry

## 2019-08-21 DIAGNOSIS — F3341 Major depressive disorder, recurrent, in partial remission: Secondary | ICD-10-CM

## 2019-08-21 DIAGNOSIS — F09 Unspecified mental disorder due to known physiological condition: Secondary | ICD-10-CM

## 2019-08-21 DIAGNOSIS — F5105 Insomnia due to other mental disorder: Secondary | ICD-10-CM | POA: Diagnosis not present

## 2019-08-21 DIAGNOSIS — F410 Panic disorder [episodic paroxysmal anxiety] without agoraphobia: Secondary | ICD-10-CM

## 2019-08-21 NOTE — Progress Notes (Signed)
Virtual Visit via Video Note  I connected with Evelyn Bentley on 08/21/19 at 11:15 AM EDT by a video enabled telemedicine application and verified that I am speaking with the correct person using two identifiers.   I discussed the limitations of evaluation and management by telemedicine and the availability of in person appointments. The patient expressed understanding and agreed to proceed.   I discussed the assessment and treatment plan with the patient. The patient was provided an opportunity to ask questions and all were answered. The patient agreed with the plan and demonstrated an understanding of the instructions.   The patient was advised to call back or seek an in-person evaluation if the symptoms worsen or if the condition fails to improve as anticipated.   P[facility] MD Progress Note  08/21/2019 12:15 PM Evelyn Bentley  MRN:  [id number]  Chief Complaint:  Chief Complaint    Follow-up     HPI: Evelyn Bentley is a 63 year old Caucasian female who is widowed, has a history of MDD, lives at [facility], was evaluated by telemedicine today.  Patient has a history of MDD currently in remission, panic attacks, insomnia, chronic pain.  Patient reports she is currently doing okay with regards to her mood symptoms.  She denies any significant depression or anxiety symptoms.  She is compliant on her medications as prescribed.  She denies side effects.  Patient reports she continues to sleep well on the temazepam.  That is the only medication that works for her and she has had side effects from several other medication trials in the past for sleep.  Patient reports however that she is currently struggling with some memory problems.  She reports that has been having these spells when she is in the middle of an activity and she would black out completely and would not know what she was doing.  She has been struggling with memory problems for quite a while now.  She however was improving and did not  struggle much until recently when her memory issues started getting worse again.  She does have a history of vitamin B12 deficiency as well as thyroid abnormalities which are currently being managed by her primary care provider.  Patient denies any suicidality, homicidality or perceptual disturbances.  Patient denies any other concerns today. Visit Diagnosis:    ICD-10-CM   1. MDD (major depressive disorder), recurrent, in partial remission ([place])  F33.41   2. Panic attack  F41.0   3. Insomnia due to mental condition  F51.05   4. Cognitive disorder  F09    unspecified    Past Psychiatric History: I have reviewed past psychiatric history from my progress note on 02/26/2018  Past Medical History:  Past Medical History:  Diagnosis Date  . Anxiety   . Arthritis   . Bursitis    Rt hip  . Complication of anesthesia    nausea  . Depression   . Fibromyalgia   . GERD (gastroesophageal reflux disease)   . Hypercholesteremia   . Hypertension   . Hypothyroidism   . Thyroid disease     Past Surgical History:  Procedure Laterality Date  . ABDOMINAL HYSTERECTOMY    . CHOLECYSTECTOMY    . COLONOSCOPY WITH PROPOFOL N/A 11/13/2016   Procedure: COLONOSCOPY WITH PROPOFOL;  Surgeon: Lollie Sails, MD;  Location: [facility]COPY;  Service: Endoscopy;  Laterality: N/A;  . ESOPHAGOGASTRODUODENOSCOPY (EGD) WITH PROPOFOL N/A 11/13/2016   Procedure: ESOPHAGOGASTRODUODENOSCOPY (EGD) WITH PROPOFOL;  Surgeon: Lollie Sails, MD;  Location:  [facility]COPY;  Service: Endoscopy;  Laterality: N/A;  . ESOPHAGOGASTRODUODENOSCOPY (EGD) WITH PROPOFOL N/A 12/16/2018   Procedure: ESOPHAGOGASTRODUODENOSCOPY (EGD) WITH PROPOFOL;  Surgeon: Lollie Sails, MD;  Location: [facility] ENDOSCOPY;  Service: Endoscopy;  Laterality: N/A;  . LAPAROSCOPIC HYSTERECTOMY Bilateral 02/28/2016   Procedure: HYSTERECTOMY TOTAL LAPAROSCOPIC / BSO;  Surgeon: Honor Loh Ward, MD;  Location: [facility] ORS;  Service: Gynecology;  Laterality:  Bilateral;  . NECK SURGERY    . NISSEN FUNDOPLICATION    . TUBAL LIGATION    . URETEROSCOPY WITH HOLMIUM LASER LITHOTRIPSY Left 07/03/2016   Procedure: URETEROSCOPY WITH HOLMIUM LASER LITHOTRIPSY;  Surgeon: Royston Cowper, MD;  Location: [facility] ORS;  Service: Urology;  Laterality: Left;    Family Psychiatric History: I have reviewed family psychiatric history from my progress note on 02/26/2018  Family History:  Family History  Problem Relation Age of Onset  . Depression Mother   . Anxiety disorder Mother   . Breast cancer Neg Hx     Social History: I have reviewed social history from my progress note on 02/26/2018 Social History   Socioeconomic History  . Marital status: Widowed    Spouse name: Not on file  . Number of children: 2  . Years of education: Not on file  . Highest education level: High school graduate  Occupational History    Comment: fulltime  Social Needs  . Financial resource strain: Not hard at all  . Food insecurity    Worry: Never true    Inability: Never true  . Transportation needs    Medical: No    Non-medical: No  Tobacco Use  . Smoking status: Former Smoker    Start date: 11/14/1974    Quit date: 05/14/2014    Years since quitting: 5.2  . Smokeless tobacco: Never Used  Substance and Sexual Activity  . Alcohol use: Not Currently    Alcohol/week: 0.0 - 1.0 standard drinks    Comment: social rare  . Drug use: No  . Sexual activity: Yes    Partners: Male    Birth control/protection: Condom  Lifestyle  . Physical activity    Days per week: 0 days    Minutes per session: 0 min  . Stress: Very much  Relationships  . Social Herbalist on phone: Never    Gets together: More than three times a week    Attends religious service: Never    Active member of club or organization: No    Attends meetings of clubs or organizations: Never    Relationship status: Widowed  Other Topics Concern  . Not on file  Social History Narrative  . Not on  file    Allergies:  Allergies  Allergen Reactions  . Codeine Nausea Only    Metabolic Disorder Labs: No results found for: HGBA1C, MPG No results found for: PROLACTIN No results found for: CHOL, TRIG, HDL, CHOLHDL, VLDL, LDLCALC No results found for: TSH  Therapeutic Level Labs: No results found for: LITHIUM No results found for: VALPROATE No components found for:  CBMZ  Current Medications: Current Outpatient Medications  Medication Sig Dispense Refill  . buPROPion (WELLBUTRIN XL) 300 MG 24 hr tablet Take 1 tablet (300 mg total) by mouth daily. 90 tablet 1  . Cholecalciferol (VITAMIN D3) 1000 units CAPS Take 1 capsule by mouth daily.    . cloNIDine (CATAPRES) 0.1 MG tablet Take 0.1 mg by mouth at bedtime.  4  . DEXILANT 60 MG capsule Take 60 mg  by mouth daily.  2  . dicyclomine (BENTYL) 10 MG capsule Take by mouth.    . DULoxetine (CYMBALTA) 20 MG capsule Take 1 capsule (20 mg total) by mouth daily. TO BE TAKEN ALONG WITH THE 60 MG 90 capsule 1  . DULoxetine (CYMBALTA) 60 MG capsule Take 1 capsule (60 mg total) by mouth at bedtime. TO BE TAKEN ALONG WITH 20 MG 90 capsule 1  . fluticasone (FLONASE) 50 MCG/ACT nasal spray fluticasone 50 mcg/actuation nasal spray,suspension    . gabapentin (NEURONTIN) 300 MG capsule TAKE 300 MG TWICE A DAY FOR ONE WEEK THEN INCREASE TO 300 MG THREE TIMES A DAY    . hydrOXYzine (ATARAX/VISTARIL) 10 MG tablet TAKE 1-2 TABLETS (10-20 MG TOTAL) BY MOUTH 2 (TWO) TIMES DAILY AS NEEDED (SEVERE ANXIETY SX). 360 tablet 3  . ibuprofen (ADVIL,MOTRIN) 600 MG tablet     . levothyroxine (SYNTHROID) 100 MCG tablet Take 100 mcg by mouth daily.    Marland Kitchen levothyroxine (SYNTHROID) 88 MCG tablet Take 88 mcg by mouth daily.    Marland Kitchen levothyroxine (SYNTHROID, LEVOTHROID) 137 MCG tablet TAKE ONE TABLET BY MOUTH EVERY MORNING ON AN EMPTY STOMACH.  3  . lisinopril-hydrochlorothiazide (PRINZIDE,ZESTORETIC) 10-12.5 MG tablet Take 1 tablet by mouth daily.   2  . nitrofurantoin  (MACRODANTIN) 100 MG capsule Take 100 mg by mouth 2 (two) times daily.  0  . nitrofurantoin (MACRODANTIN) 50 MG capsule Take by mouth daily.    . phenazopyridine (PYRIDIUM) 200 MG tablet     . predniSONE (DELTASONE) 10 MG tablet Take 6 tabs (60mg ) by mouth followed by 5 tabs (50mg ), then 4 tabs (40mg ), then 3 tabs (30mg ), then 2 tabs (20mg ), then 1 tab (10mg )    . rosuvastatin (CRESTOR) 20 MG tablet Take 20 mg by mouth daily.    . sucralfate (CARAFATE) 1 g tablet TAKE 1 TABLET (1 G TOTAL) BY MOUTH 4 (FOUR) TIMES DAILY BEFORE MEALS AND NIGHTLY    . temazepam (RESTORIL) 22.5 MG capsule Take 1 capsule (22.5 mg total) by mouth at bedtime as needed for sleep. 30 capsule 5  . VENTOLIN HFA 108 (90 Base) MCG/ACT inhaler     . Vitamin D, Ergocalciferol, (DRISDOL) 50000 units CAPS capsule TAKE 1 CAP BY MOUTH ONCE A WEEK  3   No current facility-administered medications for this visit.      Musculoskeletal: Strength & Muscle Tone: UTA Gait & Station: normal Patient leans: N/A  Psychiatric Specialty Exam: Review of Systems  Psychiatric/Behavioral: Positive for memory loss. Negative for depression, substance abuse and suicidal ideas. The patient is not nervous/anxious.   All other systems reviewed and are negative.   There were no vitals taken for this visit.There is no height or weight on file to calculate BMI.  General Appearance: Casual  Eye Contact:  Fair  Speech:  Clear and Coherent  Volume:  Normal  Mood:  Euthymic  Affect:  Congruent  Thought Process:  Goal Directed and Descriptions of Associations: Intact  Orientation:  Full (Time, Place, and Person)  Thought Content: Logical   Suicidal Thoughts:  No  Homicidal Thoughts:  No  Memory:  Immediate;   Fair Recent;   Fair Remote;   Fair  Judgement:  Fair  Insight:  Fair  Psychomotor Activity:  Normal  Concentration:  Concentration: Fair and Attention Span: Fair  Recall:  [employer] of Knowledge: Fair  Language: Fair  Akathisia:   No  Handed:  Right  AIMS (if indicated): Denies tremors, rigidity  Assets:  Communication Skills Desire for Improvement Housing Social Support  ADL's:  Intact  Cognition: Impaired, unspecified  Sleep:  Fair   Screenings:   Assessment and Plan: Evelyn Bentley is a 63 year old Caucasian female, widowed, has a history of MDD, fibromyalgia, memory changes was evaluated by telemedicine today.  Patient with several psychosocial stressors including several deaths in the family, her son died when he was 64 years old due to neurological problems son who had muscular dystrophy died at the age of 15, husband passed away soon after that as well as daughter with health issues.  Patient also has current psychosocial stressors of the pandemic.  Patient however reports mood symptoms are stable on the current medication regimen although she continues to struggle with memory problems which are getting worse.  Discussed plan as noted below.  Plan Depression in remission Cymbalta 80 mg p.o. daily Wellbutrin extended release 300 mg p.o. daily  Panic attacks-stable Cymbalta as prescribed Hydroxyzine 10 to 20 mg p.o. daily PRN for severe anxiety attacks  Insomnia-stable Temazepam 32.5 mg p.o. nightly  Cognitive changes unstable Patient is on polypharmacy.  She is on medications like temazepam, gabapentin and hydroxyzine.  Discussed with patient the effect of medications on her memory. She also has a history of thyroid abnormalities and vitamin B12 deficiency.  Discussed with her to continue to work with her primary care provider on stabilizing this. She already sees a neurologist Dr. Melrose Nakayama.  Advised her to schedule an appointment with Dr. Melrose Nakayama for evaluation of her cognitive changes.  Follow-up in clinic in 2 to 3 months or sooner if needed.  December 18 at 11:30 AM  I have spent atleast 15 minutes non  face to face with patient today. More than 50 % of the time was spent for psychoeducation and supportive  psychotherapy and care coordination. This note was generated in part or whole with voice recognition software. Voice recognition is usually quite accurate but there are transcription errors that can and very often do occur. I apologize for any typographical errors that were not detected and corrected.       Ursula Alert, MD 08/21/2019, 12:15 PM

## 2019-08-25 ENCOUNTER — Ambulatory Visit: Payer: Self-pay | Admitting: Psychiatry

## 2019-08-27 DIAGNOSIS — R4189 Other symptoms and signs involving cognitive functions and awareness: Secondary | ICD-10-CM | POA: Insufficient documentation

## 2019-09-02 ENCOUNTER — Other Ambulatory Visit: Payer: Self-pay

## 2019-09-02 ENCOUNTER — Ambulatory Visit (INDEPENDENT_AMBULATORY_CARE_PROVIDER_SITE_OTHER): Payer: BC Managed Care – PPO | Admitting: Psychiatry

## 2019-09-02 ENCOUNTER — Encounter: Payer: Self-pay | Admitting: Psychiatry

## 2019-09-02 DIAGNOSIS — F09 Unspecified mental disorder due to known physiological condition: Secondary | ICD-10-CM

## 2019-09-02 DIAGNOSIS — F3341 Major depressive disorder, recurrent, in partial remission: Secondary | ICD-10-CM

## 2019-09-02 DIAGNOSIS — F5105 Insomnia due to other mental disorder: Secondary | ICD-10-CM

## 2019-09-02 DIAGNOSIS — F410 Panic disorder [episodic paroxysmal anxiety] without agoraphobia: Secondary | ICD-10-CM | POA: Diagnosis not present

## 2019-09-02 MED ORDER — BUPROPION HCL ER (XL) 150 MG PO TB24: 150.0000 mg | ORAL_TABLET | Freq: Every day | ORAL | 1 refills | Status: DC

## 2019-09-02 MED ORDER — BELSOMRA 10 MG PO TABS: 10.0000 mg | ORAL_TABLET | Freq: Every day | ORAL | 1 refills | Status: DC

## 2019-09-02 NOTE — Progress Notes (Signed)
Virtual Visit via Video Note  I connected with Evelyn Bentley on 09/02/19 at 11:45 AM EDT by a video enabled telemedicine application and verified that I am speaking with the correct person using two identifiers.   I discussed the limitations of evaluation and management by telemedicine and the availability of in person appointments. The patient expressed understanding and agreed to proceed.   I discussed the assessment and treatment plan with the patient. The patient was provided an opportunity to ask questions and all were answered. The patient agreed with the plan and demonstrated an understanding of the instructions.   The patient was advised to call back or seek an in-person evaluation if the symptoms worsen or if the condition fails to improve as anticipated.  [place] MD OP Progress Note  09/02/2019 12:32 PM Evelyn Bentley  MRN:  [id number]  Chief Complaint:  Chief Complaint    Follow-up     HPI: Evelyn Bentley is a 62 year old Caucasian female, widowed, has a history of MDD, panic attacks, insomnia, cognitive disorder was evaluated by telemedicine today.  Patient today reports she continues to do well with regards to her mood symptoms.  She denies any depression or anxiety at this time.  She reports she is compliant on her medications.  She reports sleep is good on temazepam.  She however continues to have memory changes.  She reports recently she [place] and she completely went blank  for a few minutes since she did not know what Exit to take.  These kind of memory problems has been going on for a while and getting worse.  She reports she recently saw her neurologist who did SLUMS on her and she scored 26 out of 30.  She reports she she was told she does not need neurocognitive testing at this time.  She was advised to talk to her psychiatrist to make changes with her medications.  Discussed with patient that the medications like temazepam,  gabapentin, hydroxyzine can have an impact on her cognitive function.  Discussed with her the temazepam can be discontinued and her symptoms can be monitored.  She has never tried [employer].  Discussed starting Belsomra for sleep.  She agreed with plan.  Patient denies any suicidality, homicidality or perceptual disturbances. Visit Diagnosis:    ICD-10-CM   1. MDD (major depressive disorder), recurrent, in partial remission ([place])  F33.41   2. Panic attack  F41.0   3. Insomnia due to mental condition  F51.05 Suvorexant (BELSOMRA) 10 MG TABS  4. Cognitive disorder  F09 buPROPion (WELLBUTRIN XL) 150 MG 24 hr tablet    Past Psychiatric History: I have reviewed past psychiatric history from my progress note on 02/26/2018.  Past Medical History:  Past Medical History:  Diagnosis Date  . Anxiety   . Arthritis   . Bursitis    Rt hip  . Complication of anesthesia    nausea  . Depression   . Fibromyalgia   . GERD (gastroesophageal reflux disease)   . Hypercholesteremia   . Hypertension   . Hypothyroidism   . Thyroid disease     Past Surgical History:  Procedure Laterality Date  . ABDOMINAL HYSTERECTOMY    . CHOLECYSTECTOMY    . COLONOSCOPY WITH PROPOFOL N/A 11/13/2016   Procedure: COLONOSCOPY WITH PROPOFOL;  Surgeon: Lollie Sails, MD;  Location: [facility] ENDOSCOPY;  Service: Endoscopy;  Laterality: N/A;  . ESOPHAGOGASTRODUODENOSCOPY (EGD) WITH PROPOFOL N/A 11/13/2016   Procedure: ESOPHAGOGASTRODUODENOSCOPY (EGD) WITH PROPOFOL;  Surgeon: Lollie Sails, MD;  Location: [facility] ENDOSCOPY;  Service: Endoscopy;  Laterality: N/A;  . ESOPHAGOGASTRODUODENOSCOPY (EGD) WITH PROPOFOL N/A 12/16/2018   Procedure: ESOPHAGOGASTRODUODENOSCOPY (EGD) WITH PROPOFOL;  Surgeon: Lollie Sails, MD;  Location: [facility] ENDOSCOPY;  Service: Endoscopy;  Laterality: N/A;  . LAPAROSCOPIC HYSTERECTOMY Bilateral 02/28/2016   Procedure: HYSTERECTOMY TOTAL LAPAROSCOPIC / BSO;  Surgeon: Honor Loh Ward, MD;  Location: [facility]  ORS;  Service: Gynecology;  Laterality: Bilateral;  . NECK SURGERY    . NISSEN FUNDOPLICATION    . TUBAL LIGATION    . URETEROSCOPY WITH HOLMIUM LASER LITHOTRIPSY Left 07/03/2016   Procedure: URETEROSCOPY WITH HOLMIUM LASER LITHOTRIPSY;  Surgeon: Royston Cowper, MD;  Location: [facility] ORS;  Service: Urology;  Laterality: Left;    Family Psychiatric History: I have reviewed family psychiatric history from my progress note on 02/26/2018.  Family History:  Family History  Problem Relation Age of Onset  . Depression Mother   . Anxiety disorder Mother   . Breast cancer Neg Hx     Social History: I have reviewed social history from my progress note on 02/26/2018. Social History   Socioeconomic History  . Marital status: Widowed    Spouse name: Not on file  . Number of children: 2  . Years of education: Not on file  . Highest education level: High school graduate  Occupational History    Comment: fulltime  Social Needs  . Financial resource strain: Not hard at all  . Food insecurity    Worry: Never true    Inability: Never true  . Transportation needs    Medical: No    Non-medical: No  Tobacco Use  . Smoking status: Former Smoker    Start date: 11/14/1974    Quit date: 05/14/2014    Years since quitting: 5.3  . Smokeless tobacco: Never Used  Substance and Sexual Activity  . Alcohol use: Not Currently    Alcohol/week: 0.0 - 1.0 standard drinks    Comment: social rare  . Drug use: No  . Sexual activity: Yes    Partners: Male    Birth control/protection: Condom  Lifestyle  . Physical activity    Days per week: 0 days    Minutes per session: 0 min  . Stress: Very much  Relationships  . Social Herbalist on phone: Never    Gets together: More than three times a week    Attends religious service: Never    Active member of club or organization: No    Attends meetings of clubs or organizations: Never    Relationship status: Widowed  Other Topics Concern  . Not on  file  Social History Narrative  . Not on file    Allergies:  Allergies  Allergen Reactions  . Codeine Nausea Only    Metabolic Disorder Labs: No results found for: HGBA1C, MPG No results found for: PROLACTIN No results found for: CHOL, TRIG, HDL, CHOLHDL, VLDL, LDLCALC No results found for: TSH  Therapeutic Level Labs: No results found for: LITHIUM No results found for: VALPROATE No components found for:  CBMZ  Current Medications: Current Outpatient Medications  Medication Sig Dispense Refill  . azithromycin (ZITHROMAX) 250 MG tablet TAKE 2 TABLETS BY MOUTH TODAY, THEN TAKE 1 TABLET DAILY FOR 4 DAYS    . buPROPion (WELLBUTRIN XL) 150 MG 24 hr tablet Take 1 tablet (150 mg total) by mouth daily. To be combined with 300 mg 90 tablet 1  . buPROPion (WELLBUTRIN XL)  300 MG 24 hr tablet Take 1 tablet (300 mg total) by mouth daily. 90 tablet 1  . Cholecalciferol (VITAMIN D3) 1000 units CAPS Take 1 capsule by mouth daily.    . cloNIDine (CATAPRES) 0.1 MG tablet Take 0.1 mg by mouth at bedtime.  4  . DEXILANT 60 MG capsule Take 60 mg by mouth daily.  2  . dicyclomine (BENTYL) 10 MG capsule Take by mouth.    . DULoxetine (CYMBALTA) 20 MG capsule Take 1 capsule (20 mg total) by mouth daily. TO BE TAKEN ALONG WITH THE 60 MG 90 capsule 1  . DULoxetine (CYMBALTA) 60 MG capsule Take 1 capsule (60 mg total) by mouth at bedtime. TO BE TAKEN ALONG WITH 20 MG 90 capsule 1  . fluticasone (FLONASE) 50 MCG/ACT nasal spray fluticasone 50 mcg/actuation nasal spray,suspension    . gabapentin (NEURONTIN) 300 MG capsule TAKE 300 MG TWICE A DAY FOR ONE WEEK THEN INCREASE TO 300 MG THREE TIMES A DAY    . hydrOXYzine (ATARAX/VISTARIL) 10 MG tablet TAKE 1-2 TABLETS (10-20 MG TOTAL) BY MOUTH 2 (TWO) TIMES DAILY AS NEEDED (SEVERE ANXIETY SX). 360 tablet 3  . ibuprofen (ADVIL,MOTRIN) 600 MG tablet     . levothyroxine (SYNTHROID) 100 MCG tablet Take 100 mcg by mouth daily.    Marland Kitchen levothyroxine (SYNTHROID) 88 MCG  tablet Take 88 mcg by mouth daily.    Marland Kitchen levothyroxine (SYNTHROID, LEVOTHROID) 137 MCG tablet TAKE ONE TABLET BY MOUTH EVERY MORNING ON AN EMPTY STOMACH.  3  . lisinopril-hydrochlorothiazide (PRINZIDE,ZESTORETIC) 10-12.5 MG tablet Take 1 tablet by mouth daily.   2  . methylPREDNISolone (MEDROL DOSEPAK) 4 MG TBPK tablet TAKE 6 TABLETS ON DAY 1 AS DIRECTED ON PACKAGE AND DECREASE BY 1 TAB EACH DAY FOR A TOTAL OF 6 DAYS    . nitrofurantoin (MACRODANTIN) 100 MG capsule Take 100 mg by mouth 2 (two) times daily.  0  . nitrofurantoin (MACRODANTIN) 50 MG capsule Take by mouth daily.    . phenazopyridine (PYRIDIUM) 200 MG tablet     . predniSONE (DELTASONE) 10 MG tablet Take 6 tabs (60mg ) by mouth followed by 5 tabs (50mg ), then 4 tabs (40mg ), then 3 tabs (30mg ), then 2 tabs (20mg ), then 1 tab (10mg )    . rosuvastatin (CRESTOR) 20 MG tablet Take 20 mg by mouth daily.    . sucralfate (CARAFATE) 1 g tablet TAKE 1 TABLET (1 G TOTAL) BY MOUTH 4 (FOUR) TIMES DAILY BEFORE MEALS AND NIGHTLY    . Suvorexant (BELSOMRA) 10 MG TABS Take 10 mg by mouth at bedtime. 30 tablet 1  . VENTOLIN HFA 108 (90 Base) MCG/ACT inhaler     . Vitamin D, Ergocalciferol, (DRISDOL) 50000 units CAPS capsule TAKE 1 CAP BY MOUTH ONCE A WEEK  3   No current facility-administered medications for this visit.      Musculoskeletal: Strength & Muscle Tone: UTA Gait & Station: normal Patient leans: N/A  Psychiatric Specialty Exam: Review of Systems  Psychiatric/Behavioral: Positive for memory loss. Negative for depression, hallucinations, substance abuse and suicidal ideas. The patient is not nervous/anxious and does not have insomnia.   All other systems reviewed and are negative.   There were no vitals taken for this visit.There is no height or weight on file to calculate BMI.  General Appearance: Casual  Eye Contact:  Fair  Speech:  Clear and Coherent  Volume:  Normal  Mood:  Euthymic  Affect:  Congruent  Thought Process:  Goal  Directed and Descriptions  of Associations: Intact  Orientation:  Full (Time, Place, and Person)  Thought Content: Logical   Suicidal Thoughts:  No  Homicidal Thoughts:  No  Memory:  Immediate;   Fair Recent;   Fair Remote;   Fair  Judgement:  Fair  Insight:  Fair  Psychomotor Activity:  Normal  Concentration:  Concentration: Fair and Attention Span: Fair  Recall:  [employer] of Knowledge: Fair  Language: Fair  Akathisia:  No  Handed:  Right  AIMS (if indicated): Denies tremors, rigidity  Assets:  Communication Skills Desire for Improvement Social Support  ADL's:  Intact  Cognition: WNL  Sleep:  Fair   Screenings:   Assessment and Plan: Adrie is a 63 year old Caucasian female, widowed, has a history of MDD, fibromyalgia, memory changes was evaluated by telemedicine today.  Patient currently has psychosocial stressors of the pandemic.  She struggles with cognitive changes or memory problems as noted above.  She otherwise reports mood symptoms are stable.  Plan Depression in remission Cymbalta 80 mg p.o. daily Wellbutrin as prescribed  Panic attacks-stable Cymbalta as prescribed Hydroxyzine 10 to 20 mg p.o. daily as needed for anxiety attacks  Insomnia-stable However due to cognitive changes will discontinue temazepam. Start Belsomra 10 mg p.o. nightly as needed  Cognitive changes-unstable Patient had recent evaluation per neurology-I have reviewed medical records in [facility] per Dr. Melrose Nakayama 08/27/2019-for cognitive difficulty-patient with memory difficulty.  States her sleep is good, has had sleep study that was negative for sleep apnea.  She has a history of anxiety and depression.  Has gotten lost driving, forgetting conversations, forgetting appointments.  Patient states this is interfering with her work.SLUMS - 26/30.  We will consider a referral to [facility] for neurocognitive evaluation.  Advised patient to speak with the psychiatrist about adjusting her medications."  -Will  increase her Wellbutrin today to 450 mg p.o. daily for cognitive changes - Her temazepam Has been discontinued.   Follow-up in clinic in 4 weeks or sooner if needed.  November 16 at 3 PM  I have spent atleast 25 minutes non face to face with patient today. More than 50 % of the time was spent for psychoeducation and supportive psychotherapy and care coordination. This note was generated in part or whole with voice recognition software. Voice recognition is usually quite accurate but there are transcription errors that can and very often do occur. I apologize for any typographical errors that were not detected and corrected.        Ursula Alert, MD 09/02/2019, 12:32 PM

## 2019-09-16 ENCOUNTER — Other Ambulatory Visit: Payer: Self-pay

## 2019-09-16 DIAGNOSIS — Z20822 Contact with and (suspected) exposure to covid-19: Secondary | ICD-10-CM

## 2019-09-17 LAB — NOVEL CORONAVIRUS, NAA: SARS-CoV-2, NAA: NOT DETECTED

## 2019-09-28 ENCOUNTER — Other Ambulatory Visit: Payer: Self-pay

## 2019-09-28 ENCOUNTER — Ambulatory Visit (INDEPENDENT_AMBULATORY_CARE_PROVIDER_SITE_OTHER): Payer: BC Managed Care – PPO | Admitting: Psychiatry

## 2019-09-28 ENCOUNTER — Encounter: Payer: Self-pay | Admitting: Psychiatry

## 2019-09-28 DIAGNOSIS — F09 Unspecified mental disorder due to known physiological condition: Secondary | ICD-10-CM | POA: Diagnosis not present

## 2019-09-28 DIAGNOSIS — F3341 Major depressive disorder, recurrent, in partial remission: Secondary | ICD-10-CM

## 2019-09-28 DIAGNOSIS — F410 Panic disorder [episodic paroxysmal anxiety] without agoraphobia: Secondary | ICD-10-CM

## 2019-09-28 DIAGNOSIS — F5105 Insomnia due to other mental disorder: Secondary | ICD-10-CM | POA: Diagnosis not present

## 2019-09-28 MED ORDER — BELSOMRA 10 MG PO TABS: 10.0000 mg | ORAL_TABLET | Freq: Every day | ORAL | 1 refills | Status: DC

## 2019-09-28 NOTE — Progress Notes (Signed)
Virtual Visit via Video Note  I connected with Evelyn Bentley on 09/28/19 at  3:00 PM EST by a video enabled telemedicine application and verified that I am speaking with the correct person using two identifiers.   I discussed the limitations of evaluation and management by telemedicine and the availability of in person appointments. The patient expressed understanding and agreed to proceed.     I discussed the assessment and treatment plan with the patient. The patient was provided an opportunity to ask questions and all were answered. The patient agreed with the plan and demonstrated an understanding of the instructions.   The patient was advised to call back or seek an in-person evaluation if the symptoms worsen or if the condition fails to improve as anticipated.  [place] MD OP Progress Note  09/28/2019 3:26 PM Evelyn Bentley  MRN:  [id number]  Chief Complaint:  Chief Complaint    Follow-up     HPI: Evelyn Bentley is a 63 year old Caucasian female, widowed, has a history of MDD, panic attacks, insomnia, cognitive disorder was evaluated by telemedicine today.  Patient today reports she recently may have gotten infected with COVID-19 and is currently recovering from the same.  It has been almost 2 weeks.  She had significant fatigue, cough initially however she is making progress at this time.  She reports she was sleeping okay just on the melatonin for a few days however since the COVID symptoms began she has not been able to sleep much.  She has not been able to get the Belsomra which was prescribed for sleep last visit due to her insurance plan needing prior authorization.  Patient denies any suicidality, homicidality or perceptual disturbances.  She otherwise reports she is doing okay on her other medications.  Patient denies any other concerns today.   Visit Diagnosis:    ICD-10-CM   1. MDD (major depressive disorder), recurrent, in partial remission ([place])  F33.41   2. Panic attack   F41.0   3. Insomnia due to mental condition  F51.05 Suvorexant (BELSOMRA) 10 MG TABS  4. Cognitive disorder  F09     Past Psychiatric History: I have reviewed past psychiatric history from my progress note on 02/26/2018.  Past Medical History:  Past Medical History:  Diagnosis Date  . Anxiety   . Arthritis   . Bursitis    Rt hip  . Complication of anesthesia    nausea  . Depression   . Fibromyalgia   . GERD (gastroesophageal reflux disease)   . Hypercholesteremia   . Hypertension   . Hypothyroidism   . Thyroid disease     Past Surgical History:  Procedure Laterality Date  . ABDOMINAL HYSTERECTOMY    . CHOLECYSTECTOMY    . COLONOSCOPY WITH PROPOFOL N/A 11/13/2016   Procedure: COLONOSCOPY WITH PROPOFOL;  Surgeon: Lollie Sails, MD;  Location: [facility] ENDOSCOPY;  Service: Endoscopy;  Laterality: N/A;  . ESOPHAGOGASTRODUODENOSCOPY (EGD) WITH PROPOFOL N/A 11/13/2016   Procedure: ESOPHAGOGASTRODUODENOSCOPY (EGD) WITH PROPOFOL;  Surgeon: Lollie Sails, MD;  Location: [facility] ENDOSCOPY;  Service: Endoscopy;  Laterality: N/A;  . ESOPHAGOGASTRODUODENOSCOPY (EGD) WITH PROPOFOL N/A 12/16/2018   Procedure: ESOPHAGOGASTRODUODENOSCOPY (EGD) WITH PROPOFOL;  Surgeon: Lollie Sails, MD;  Location: [facility] ENDOSCOPY;  Service: Endoscopy;  Laterality: N/A;  . LAPAROSCOPIC HYSTERECTOMY Bilateral 02/28/2016   Procedure: HYSTERECTOMY TOTAL LAPAROSCOPIC / BSO;  Surgeon: Honor Loh Ward, MD;  Location: [facility] ORS;  Service: Gynecology;  Laterality: Bilateral;  . NECK SURGERY    . NISSEN FUNDOPLICATION    .  TUBAL LIGATION    . URETEROSCOPY WITH HOLMIUM LASER LITHOTRIPSY Left 07/03/2016   Procedure: URETEROSCOPY WITH HOLMIUM LASER LITHOTRIPSY;  Surgeon: Royston Cowper, MD;  Location: [facility] ORS;  Service: Urology;  Laterality: Left;    Family Psychiatric History: I have reviewed family psychiatric history from my progress note on 02/26/2018. Family History:  Family History  Problem Relation Age of Onset  .  Depression Mother   . Anxiety disorder Mother   . Breast cancer Neg Hx     Social History: I have reviewed social history from my progress note on 02/26/2018. Social History   Socioeconomic History  . Marital status: Widowed    Spouse name: Not on file  . Number of children: 2  . Years of education: Not on file  . Highest education level: High school graduate  Occupational History    Comment: fulltime  Social Needs  . Financial resource strain: Not hard at all  . Food insecurity    Worry: Never true    Inability: Never true  . Transportation needs    Medical: No    Non-medical: No  Tobacco Use  . Smoking status: Former Smoker    Start date: 11/14/1974    Quit date: 05/14/2014    Years since quitting: 5.3  . Smokeless tobacco: Never Used  Substance and Sexual Activity  . Alcohol use: Not Currently    Alcohol/week: 0.0 - 1.0 standard drinks    Comment: social rare  . Drug use: No  . Sexual activity: Yes    Partners: Male    Birth control/protection: Condom  Lifestyle  . Physical activity    Days per week: 0 days    Minutes per session: 0 min  . Stress: Very much  Relationships  . Social Herbalist on phone: Never    Gets together: More than three times a week    Attends religious service: Never    Active member of club or organization: No    Attends meetings of clubs or organizations: Never    Relationship status: Widowed  Other Topics Concern  . Not on file  Social History Narrative  . Not on file    Allergies:  Allergies  Allergen Reactions  . Codeine Nausea Only    Metabolic Disorder Labs: No results found for: HGBA1C, MPG No results found for: PROLACTIN No results found for: CHOL, TRIG, HDL, CHOLHDL, VLDL, LDLCALC No results found for: TSH  Therapeutic Level Labs: No results found for: LITHIUM No results found for: VALPROATE No components found for:  CBMZ  Current Medications: Current Outpatient Medications  Medication Sig Dispense  Refill  . azelastine (ASTELIN) 0.1 % nasal spray Place 1 spray into both nostrils 2 (two) times daily.    Marland Kitchen azithromycin (ZITHROMAX) 250 MG tablet TAKE 2 TABLETS BY MOUTH TODAY, THEN TAKE 1 TABLET DAILY FOR 4 DAYS    . buPROPion (WELLBUTRIN XL) 150 MG 24 hr tablet Take 1 tablet (150 mg total) by mouth daily. To be combined with 300 mg 90 tablet 1  . buPROPion (WELLBUTRIN XL) 300 MG 24 hr tablet Take 1 tablet (300 mg total) by mouth daily. 90 tablet 1  . chlorpheniramine-HYDROcodone (TUSSIONEX) 10-8 MG/5ML SUER SMARTSIG:5 Milliliter(s) By Mouth Every 12 Hours PRN    . Cholecalciferol (VITAMIN D3) 1000 units CAPS Take 1 capsule by mouth daily.    . cloNIDine (CATAPRES) 0.1 MG tablet Take 0.1 mg by mouth at bedtime.  4  . DEXILANT 60 MG  capsule Take 60 mg by mouth daily.  2  . dicyclomine (BENTYL) 10 MG capsule Take by mouth.    . DULoxetine (CYMBALTA) 20 MG capsule Take 1 capsule (20 mg total) by mouth daily. TO BE TAKEN ALONG WITH THE 60 MG 90 capsule 1  . DULoxetine (CYMBALTA) 60 MG capsule Take 1 capsule (60 mg total) by mouth at bedtime. TO BE TAKEN ALONG WITH 20 MG 90 capsule 1  . fluticasone (FLONASE) 50 MCG/ACT nasal spray fluticasone 50 mcg/actuation nasal spray,suspension    . gabapentin (NEURONTIN) 300 MG capsule TAKE 300 MG TWICE A DAY FOR ONE WEEK THEN INCREASE TO 300 MG THREE TIMES A DAY    . hydrOXYzine (ATARAX/VISTARIL) 10 MG tablet TAKE 1-2 TABLETS (10-20 MG TOTAL) BY MOUTH 2 (TWO) TIMES DAILY AS NEEDED (SEVERE ANXIETY SX). 360 tablet 3  . ibuprofen (ADVIL,MOTRIN) 600 MG tablet     . levothyroxine (SYNTHROID) 88 MCG tablet Take 88 mcg by mouth daily.    Marland Kitchen lisinopril-hydrochlorothiazide (PRINZIDE,ZESTORETIC) 10-12.5 MG tablet Take 1 tablet by mouth daily.   2  . nitrofurantoin (MACRODANTIN) 50 MG capsule Take by mouth daily.    . rosuvastatin (CRESTOR) 20 MG tablet Take 20 mg by mouth daily.    . sucralfate (CARAFATE) 1 g tablet TAKE 1 TABLET (1 G TOTAL) BY MOUTH 4 (FOUR) TIMES DAILY  BEFORE MEALS AND NIGHTLY    . VENTOLIN HFA 108 (90 Base) MCG/ACT inhaler     . Vitamin D, Ergocalciferol, (DRISDOL) 50000 units CAPS capsule TAKE 1 CAP BY MOUTH ONCE A WEEK  3  . levothyroxine (SYNTHROID) 100 MCG tablet Take 100 mcg by mouth daily.    Marland Kitchen levothyroxine (SYNTHROID) 75 MCG tablet Take 75 mcg by mouth daily.    Marland Kitchen levothyroxine (SYNTHROID, LEVOTHROID) 137 MCG tablet TAKE ONE TABLET BY MOUTH EVERY MORNING ON AN EMPTY STOMACH.  3  . methylPREDNISolone (MEDROL DOSEPAK) 4 MG TBPK tablet TAKE 6 TABLETS ON DAY 1 AS DIRECTED ON PACKAGE AND DECREASE BY 1 TAB EACH DAY FOR A TOTAL OF 6 DAYS    . nitrofurantoin (MACRODANTIN) 100 MG capsule Take 100 mg by mouth 2 (two) times daily.  0  . phenazopyridine (PYRIDIUM) 200 MG tablet     . predniSONE (DELTASONE) 10 MG tablet Take 6 tabs (60mg ) by mouth followed by 5 tabs (50mg ), then 4 tabs (40mg ), then 3 tabs (30mg ), then 2 tabs (20mg ), then 1 tab (10mg )    . Suvorexant (BELSOMRA) 10 MG TABS Take 10 mg by mouth at bedtime. 30 tablet 1   No current facility-administered medications for this visit.      Musculoskeletal: Strength & Muscle Tone: UTA Gait & Station: normal Patient leans: N/A  Psychiatric Specialty Exam: Review of Systems  Psychiatric/Behavioral: The patient is nervous/anxious and has insomnia.   All other systems reviewed and are negative.   There were no vitals taken for this visit.There is no height or weight on file to calculate BMI.  General Appearance: Casual  Eye Contact:  Fair  Speech:  Clear and Coherent  Volume:  Normal  Mood:  Anxious -situational  Affect:  Congruent  Thought Process:  Goal Directed and Descriptions of Associations: Intact  Orientation:  Full (Time, Place, and Person)  Thought Content: Logical   Suicidal Thoughts:  No  Homicidal Thoughts:  No  Memory:  Immediate;   Fair Recent;   Fair Remote;   Fair  Judgement:  Fair  Insight:  Fair  Psychomotor Activity:  Normal  Concentration:   Concentration: Fair and Attention Span: Fair  Recall:  [employer] of Knowledge: Fair  Language: Fair  Akathisia:  No  Handed:  Right  AIMS (if indicated):Denies tremors, rigidity  Assets:  Communication Skills Desire for Improvement Social Support  ADL's:  Intact  Cognition: WNL  Sleep:  Poor   Screenings:   Assessment and Plan: Jeyli is a 63 year old Caucasian female, widowed, has a history of MDD, fibromyalgia, memory changes was evaluated by telemedicine today.  Patient with psychosocial stressors of the pandemic as well as recent infection with COVID-19.  She continues to struggle with sleep.  Patient will continue to benefit from medication management.  Plan as noted below.  Plan Depression in remission Cymbalta 80 mg p.o. daily Wellbutrin as prescribed  Panic attacks-stable Cymbalta as prescribed Hydroxyzine 10 to 20 mg p.o. daily as needed for anxiety attacks  Insomnia-unstable Belsomra 10 mg p.o. nightly as needed-noncompliant. I have communicated with CMA here, we have not received anything from the pharmacy yet for prior authorization.  Patient advised to contact pharmacy.  Also will send a new prescription to pharmacy today.   Cognitive changes-patient had recent evaluation by neurology her slums at that time was 26 out of 30.  Per neurology note will consider a referral to [facility] for neurocognitive evaluation.' Wellbutrin was increased to 450 mg p.o. daily for cognitive changes last visit.  Follow-up in clinic in 1 month or sooner if needed.  December 15 at 11:40 AM  I have spent atleast 15 minutes non face to face with patient today. More than 50 % of the time was spent for psychoeducation and supportive psychotherapy and care coordination. This note was generated in part or whole with voice recognition software. Voice recognition is usually quite accurate but there are transcription errors that can and very often do occur. I apologize for any typographical errors  that were not detected and corrected.         Ursula Alert, MD 09/28/2019, 3:26 PM

## 2019-09-30 ENCOUNTER — Ambulatory Visit: Payer: BC Managed Care – PPO | Admitting: Psychiatry

## 2019-10-02 ENCOUNTER — Telehealth: Payer: Self-pay

## 2019-10-02 DIAGNOSIS — F5105 Insomnia due to other mental disorder: Secondary | ICD-10-CM

## 2019-10-02 MED ORDER — TEMAZEPAM 22.5 MG PO CAPS: 22.5000 mg | ORAL_CAPSULE | Freq: Every evening | ORAL | 0 refills | Status: DC | PRN

## 2019-10-02 NOTE — Telephone Encounter (Signed)
pt called states she needed to go back on the medication she was on before beacuse her insurance will not paying for the new rx

## 2019-10-02 NOTE — Telephone Encounter (Signed)
Patient reports she has not been able to fill the Belsomra due to cost.  She wants to go back on the temazepam.  We will send temazepam 22.5 mg to her pharmacy.

## 2019-10-27 ENCOUNTER — Ambulatory Visit (INDEPENDENT_AMBULATORY_CARE_PROVIDER_SITE_OTHER): Payer: BC Managed Care – PPO | Admitting: Psychiatry

## 2019-10-27 ENCOUNTER — Other Ambulatory Visit: Payer: Self-pay

## 2019-10-27 DIAGNOSIS — Z5329 Procedure and treatment not carried out because of patient's decision for other reasons: Secondary | ICD-10-CM

## 2019-10-27 NOTE — Progress Notes (Signed)
No response to call or text. 

## 2019-10-30 ENCOUNTER — Other Ambulatory Visit: Payer: Self-pay

## 2019-10-30 ENCOUNTER — Ambulatory Visit (INDEPENDENT_AMBULATORY_CARE_PROVIDER_SITE_OTHER): Payer: BC Managed Care – PPO | Admitting: Psychiatry

## 2019-10-30 ENCOUNTER — Telehealth: Payer: Self-pay

## 2019-10-30 ENCOUNTER — Encounter: Payer: Self-pay | Admitting: Psychiatry

## 2019-10-30 DIAGNOSIS — F3341 Major depressive disorder, recurrent, in partial remission: Secondary | ICD-10-CM

## 2019-10-30 DIAGNOSIS — F410 Panic disorder [episodic paroxysmal anxiety] without agoraphobia: Secondary | ICD-10-CM

## 2019-10-30 DIAGNOSIS — F5105 Insomnia due to other mental disorder: Secondary | ICD-10-CM

## 2019-10-30 DIAGNOSIS — F09 Unspecified mental disorder due to known physiological condition: Secondary | ICD-10-CM

## 2019-10-30 MED ORDER — TEMAZEPAM 22.5 MG PO CAPS: 22.5000 mg | ORAL_CAPSULE | Freq: Every evening | ORAL | 0 refills | Status: DC | PRN

## 2019-10-30 NOTE — Progress Notes (Signed)
Virtual Visit via Video Note  I connected with Evelyn Bentley on 10/30/19 at 11:30 AM EST by a video enabled telemedicine application and verified that I am speaking with the correct person using two identifiers.   I discussed the limitations of evaluation and management by telemedicine and the availability of in person appointments. The patient expressed understanding and agreed to proceed.   I discussed the assessment and treatment plan with the patient. The patient was provided an opportunity to ask questions and all were answered. The patient agreed with the plan and demonstrated an understanding of the instructions.   The patient was advised to call back or seek an in-person evaluation if the symptoms worsen or if the condition fails to improve as anticipated.   [place] MD OP Progress Note  10/30/2019 12:30 PM SHEKELA VAYNER  MRN:  [id number]  Chief Complaint:  Chief Complaint    Follow-up     HPI: Evelyn Bentley is a 63 year old Caucasian female, widowed, has a history of MDD, panic attacks, insomnia, cognitive disorder was evaluated by telemedicine today.  Patient today reports she had cognitive testing done by a psychologist few days ago.  She reports she has signed a release for them to send all the information to [place].  Patient reports she was told by the psychologist that she may benefit from psychotherapy sessions.  She was told that she does not have dementia and her concentration problems could be due to her anxiety.  She was also advised to ask for accommodations at her workplace.  Patient reports sleep continues to be restless.  She reports the temazepam does make a difference after being restarted on it.  She however reports it continues to be interrupted.  Patient reports she has a lot of racing thoughts and she is worried all the time.  She reports she has started psychotherapy sessions and is scheduled to meet with her therapist again soon.  Patient denies any suicidality,  homicidality or perceptual disturbances.  Patient reports current psychosocial stressors of the holidays, work-related stressors.     Visit Diagnosis: R/O GAD   ICD-10-CM   1. MDD (major depressive disorder), recurrent, in partial remission ([place])  F33.41   2. Panic attack  F41.0   3. Insomnia due to mental condition  F51.05 temazepam (RESTORIL) 22.5 MG capsule  4. Cognitive disorder  F09    unspecified    Past Psychiatric History: I have reviewed past psychiatric history from my progress note on 02/26/2018.  Past Medical History:  Past Medical History:  Diagnosis Date  . Anxiety   . Arthritis   . Bursitis    Rt hip  . Complication of anesthesia    nausea  . Depression   . Fibromyalgia   . GERD (gastroesophageal reflux disease)   . Hypercholesteremia   . Hypertension   . Hypothyroidism   . Thyroid disease     Past Surgical History:  Procedure Laterality Date  . ABDOMINAL HYSTERECTOMY    . CHOLECYSTECTOMY    . COLONOSCOPY WITH PROPOFOL N/A 11/13/2016   Procedure: COLONOSCOPY WITH PROPOFOL;  Surgeon: Lollie Sails, MD;  Location[facility] ENDOSCOPY;  Service: Endoscopy;  Laterality: N/A;  . ESOPHAGOGASTRODUODENOSCOPY (EGD) WITH PROPOFOL N/A 11/13/2016   Procedure: ESOPHAGOGASTRODUODENOSCOPY (EGD) WITH PROPOFOL;  Surgeon: Lollie Sails, MD;  Location: [facility] ENDOSCOPY;  Service: Endoscopy;  Laterality: N/A;  . ESOPHAGOGASTRODUODENOSCOPY (EGD) WITH PROPOFOL N/A 12/16/2018   Procedure: ESOPHAGOGASTRODUODENOSCOPY (EGD) WITH PROPOFOL;  Surgeon: Lollie Sails, MD;  Location: [facility] ENDOSCOPY;  Service: Endoscopy;  Laterality: N/A;  . LAPAROSCOPIC HYSTERECTOMY Bilateral 02/28/2016   Procedure: HYSTERECTOMY TOTAL LAPAROSCOPIC / BSO;  Surgeon: Honor Loh Ward, MD;  Location: [facility] ORS;  Service: Gynecology;  Laterality: Bilateral;  . NECK SURGERY    . NISSEN FUNDOPLICATION    . TUBAL LIGATION    . URETEROSCOPY WITH HOLMIUM LASER LITHOTRIPSY Left 07/03/2016   Procedure: URETEROSCOPY  WITH HOLMIUM LASER LITHOTRIPSY;  Surgeon: Royston Cowper, MD;  Location: [facility] ORS;  Service: Urology;  Laterality: Left;    Family Psychiatric History: Reviewed family psychiatric history from my progress note on 02/26/2018. Family History:  Family History  Problem Relation Age of Onset  . Depression Mother   . Anxiety disorder Mother   . Breast cancer Neg Hx     Social History: Reviewed social history from my progress note on 02/26/2018. Social History   Socioeconomic History  . Marital status: Widowed    Spouse name: Not on file  . Number of children: 2  . Years of education: Not on file  . Highest education level: High school graduate  Occupational History    Comment: fulltime  Tobacco Use  . Smoking status: Former Smoker    Start date: 11/14/1974    Quit date: 05/14/2014    Years since quitting: 5.4  . Smokeless tobacco: Never Used  Substance and Sexual Activity  . Alcohol use: Not Currently    Alcohol/week: 0.0 - 1.0 standard drinks    Comment: social rare  . Drug use: No  . Sexual activity: Yes    Partners: Male    Birth control/protection: Condom  Other Topics Concern  . Not on file  Social History Narrative  . Not on file   Social Determinants of Health   Financial Resource Strain:   . Difficulty of Paying Living Expenses: Not on file  Food Insecurity:   . Worried About Charity fundraiser in the Last Year: Not on file  . Ran Out of Food in the Last Year: Not on file  Transportation Needs:   . Lack of Transportation (Medical): Not on file  . Lack of Transportation (Non-Medical): Not on file  Physical Activity:   . Days of Exercise per Week: Not on file  . Minutes of Exercise per Session: Not on file  Stress:   . Feeling of Stress : Not on file  Social Connections:   . Frequency of Communication with Friends and Family: Not on file  . Frequency of Social Gatherings with Friends and Family: Not on file  . Attends Religious Services: Not on file  . Active  Member of Clubs or Organizations: Not on file  . Attends Archivist Meetings: Not on file  . Marital Status: Not on file    Allergies:  Allergies  Allergen Reactions  . Codeine Nausea Only    Metabolic Disorder Labs: No results found for: HGBA1C, MPG No results found for: PROLACTIN No results found for: CHOL, TRIG, HDL, CHOLHDL, VLDL, LDLCALC No results found for: TSH  Therapeutic Level Labs: No results found for: LITHIUM No results found for: VALPROATE No components found for:  CBMZ  Current Medications: Current Outpatient Medications  Medication Sig Dispense Refill  . azelastine (ASTELIN) 0.1 % nasal spray Place 1 spray into both nostrils 2 (two) times daily.    Marland Kitchen azithromycin (ZITHROMAX) 250 MG tablet TAKE 2 TABLETS BY MOUTH TODAY, THEN TAKE 1 TABLET DAILY FOR 4 DAYS    . buPROPion (WELLBUTRIN XL) 150 MG 24  hr tablet Take 1 tablet (150 mg total) by mouth daily. To be combined with 300 mg 90 tablet 1  . buPROPion (WELLBUTRIN XL) 300 MG 24 hr tablet Take 1 tablet (300 mg total) by mouth daily. 90 tablet 1  . chlorpheniramine-HYDROcodone (TUSSIONEX) 10-8 MG/5ML SUER SMARTSIG:5 Milliliter(s) By Mouth Every 12 Hours PRN    . Cholecalciferol (VITAMIN D3) 1000 units CAPS Take 1 capsule by mouth daily.    . cloNIDine (CATAPRES) 0.1 MG tablet Take 0.1 mg by mouth at bedtime.  4  . DEXILANT 60 MG capsule Take 60 mg by mouth daily.  2  . dicyclomine (BENTYL) 10 MG capsule Take by mouth.    . DULoxetine (CYMBALTA) 20 MG capsule Take 1 capsule (20 mg total) by mouth daily. TO BE TAKEN ALONG WITH THE 60 MG 90 capsule 1  . DULoxetine (CYMBALTA) 60 MG capsule Take 1 capsule (60 mg total) by mouth at bedtime. TO BE TAKEN ALONG WITH 20 MG 90 capsule 1  . fluticasone (FLONASE) 50 MCG/ACT nasal spray fluticasone 50 mcg/actuation nasal spray,suspension    . gabapentin (NEURONTIN) 300 MG capsule TAKE 300 MG TWICE A DAY FOR ONE WEEK THEN INCREASE TO 300 MG THREE TIMES A DAY    .  hydrOXYzine (ATARAX/VISTARIL) 10 MG tablet TAKE 1-2 TABLETS (10-20 MG TOTAL) BY MOUTH 2 (TWO) TIMES DAILY AS NEEDED (SEVERE ANXIETY SX). 360 tablet 3  . ibuprofen (ADVIL,MOTRIN) 600 MG tablet     . levothyroxine (SYNTHROID) 75 MCG tablet Take 75 mcg by mouth daily.    Marland Kitchen levothyroxine (SYNTHROID, LEVOTHROID) 137 MCG tablet TAKE ONE TABLET BY MOUTH EVERY MORNING ON AN EMPTY STOMACH.  3  . lisinopril-hydrochlorothiazide (PRINZIDE,ZESTORETIC) 10-12.5 MG tablet Take 1 tablet by mouth daily.   2  . methylPREDNISolone (MEDROL DOSEPAK) 4 MG TBPK tablet TAKE 6 TABLETS ON DAY 1 AS DIRECTED ON PACKAGE AND DECREASE BY 1 TAB EACH DAY FOR A TOTAL OF 6 DAYS    . nitrofurantoin (MACRODANTIN) 100 MG capsule Take 100 mg by mouth 2 (two) times daily.  0  . nitrofurantoin (MACRODANTIN) 50 MG capsule Take by mouth daily.    . phenazopyridine (PYRIDIUM) 200 MG tablet     . predniSONE (DELTASONE) 10 MG tablet Take 6 tabs (60mg ) by mouth followed by 5 tabs (50mg ), then 4 tabs (40mg ), then 3 tabs (30mg ), then 2 tabs (20mg ), then 1 tab (10mg )    . rosuvastatin (CRESTOR) 20 MG tablet Take 20 mg by mouth daily.    . sucralfate (CARAFATE) 1 g tablet TAKE 1 TABLET (1 G TOTAL) BY MOUTH 4 (FOUR) TIMES DAILY BEFORE MEALS AND NIGHTLY    . temazepam (RESTORIL) 22.5 MG capsule Take 1 capsule (22.5 mg total) by mouth at bedtime as needed for sleep. 30 capsule 0  . VENTOLIN HFA 108 (90 Base) MCG/ACT inhaler     . Vitamin D, Ergocalciferol, (DRISDOL) 50000 units CAPS capsule TAKE 1 CAP BY MOUTH ONCE A WEEK  3   No current facility-administered medications for this visit.     Musculoskeletal: Strength & Muscle Tone: UTA Gait & Station: normal Patient leans: N/A  Psychiatric Specialty Exam: Review of Systems  Psychiatric/Behavioral: Positive for sleep disturbance. The patient is nervous/anxious.   All other systems reviewed and are negative.   There were no vitals taken for this visit.There is no height or weight on file to  calculate BMI.  General Appearance: Casual  Eye Contact:  Fair  Speech:  Clear and Coherent  Volume:  Normal  Mood:  Anxious  Affect:  Congruent  Thought Process:  Goal Directed and Descriptions of Associations: Intact  Orientation:  Full (Time, Place, and Person)  Thought Content: Logical   Suicidal Thoughts:  No  Homicidal Thoughts:  No  Memory:  Immediate;   Fair Recent;   Fair Remote;   Fair  Judgement:  Fair  Insight:  Fair  Psychomotor Activity:  Normal  Concentration:  Concentration: Fair and Attention Span: Fair  Recall:  [employer] of Knowledge: Fair  Language: Fair  Akathisia:  No  Handed:  Right  AIMS (if indicated): denies tremors, rigidity  Assets:  Communication Skills Desire for Improvement Housing Social Support  ADL's:  Intact  Cognition: WNL  Sleep:  restless   Screenings:   Assessment and Plan: Evelyn Bentley is a 63 year old Caucasian female, widowed, has a history of MDD, fibromyalgia, memory changes was evaluated by telemedicine today.  Patient with psychosocial stressors of the current pandemic and job-related stressors continues to struggle with anxiety symptoms.  Discussed plan as noted below.  Plan Depression in remission Cymbalta 80 mg p.o. daily Wellbutrin as prescribed  Panic attacks-stable Cymbalta as prescribed Hydroxyzine 10 to 20 mg p.o. daily as needed for anxiety attacks  Insomnia-improving Patient reports temazepam 22.5 mg p.o. nightly does help. She however reports sleep continues to be interrupted and she wakes up several times at night. Discussed sleep hygiene techniques like limiting the amount of time she spends on laptop, phone watching TV prior to bedtime.  She does report she is always on her phone , she will start limiting time spent.  Also discussed watching her diet, caffeine intake, and so on. Discussed melatonin over-the-counter if she continues to have struggles. Also discussed reducing the dosage of  Wellbutrin.  Cognitive changes-patient had psychological testing done.  Discussed with patient to sign a release to obtain medical records.  She reports she has already done so-pending  Patient is currently in psychotherapy sessions-advised patient to continue sessions.  Rule out GAD-we will monitor closely.  Patient is interested in Gene[employer]ight testing-provided her information for the same.  She will let writer know if she is interested.  Follow-up in clinic in 3 to 4 weeks or sooner if needed.  January 19 at 1:20 PM  I have spent atleast 15 minutes non face to face with patient today. More than 50 % of the time was spent for psychoeducation and supportive psychotherapy and care coordination. This note was generated in part or whole with voice recognition software. Voice recognition is usually quite accurate but there are transcription errors that can and very often do occur. I apologize for any typographical errors that were not detected and corrected.       Ursula Alert, MD 10/30/2019, 12:30 PM

## 2019-10-30 NOTE — Telephone Encounter (Signed)
pt was called and explained this information to her.

## 2019-10-30 NOTE — Telephone Encounter (Signed)
called pharmacy and question about a prior auth for belsomra and was told that it was not going to help because her insurance will not cover at all because it not on her plan.

## 2019-11-10 ENCOUNTER — Telehealth: Payer: Self-pay

## 2019-11-10 NOTE — Telephone Encounter (Signed)
order was put in for a genesight test.

## 2019-12-02 ENCOUNTER — Telehealth: Payer: Self-pay | Admitting: Psychiatry

## 2019-12-02 DIAGNOSIS — F5105 Insomnia due to other mental disorder: Secondary | ICD-10-CM

## 2019-12-02 MED ORDER — TEMAZEPAM 22.5 MG PO CAPS: 22.5000 mg | ORAL_CAPSULE | Freq: Every evening | ORAL | 0 refills | Status: DC | PRN

## 2019-12-02 NOTE — Telephone Encounter (Signed)
Sent Temazepam to pharmacy.

## 2019-12-03 ENCOUNTER — Encounter: Payer: Self-pay | Admitting: Psychiatry

## 2019-12-03 ENCOUNTER — Ambulatory Visit (INDEPENDENT_AMBULATORY_CARE_PROVIDER_SITE_OTHER): Payer: BC Managed Care – PPO | Admitting: Psychiatry

## 2019-12-03 ENCOUNTER — Other Ambulatory Visit: Payer: Self-pay

## 2019-12-03 DIAGNOSIS — F09 Unspecified mental disorder due to known physiological condition: Secondary | ICD-10-CM

## 2019-12-03 DIAGNOSIS — F410 Panic disorder [episodic paroxysmal anxiety] without agoraphobia: Secondary | ICD-10-CM | POA: Diagnosis not present

## 2019-12-03 DIAGNOSIS — F5105 Insomnia due to other mental disorder: Secondary | ICD-10-CM

## 2019-12-03 DIAGNOSIS — F411 Generalized anxiety disorder: Secondary | ICD-10-CM | POA: Diagnosis not present

## 2019-12-03 DIAGNOSIS — F3341 Major depressive disorder, recurrent, in partial remission: Secondary | ICD-10-CM

## 2019-12-03 MED ORDER — AMITRIPTYLINE HCL 25 MG PO TABS: 25.0000 mg | ORAL_TABLET | Freq: Every day | ORAL | 1 refills | Status: DC

## 2019-12-03 NOTE — Progress Notes (Signed)
Provider Location : [facility] Patient Location : Home  Virtual Visit via Video Note  I connected with Evelyn Bentley on 12/03/19 at  1:20 PM EST by a video enabled telemedicine application and verified that I am speaking with the correct person using two identifiers.   I discussed the limitations of evaluation and management by telemedicine and the availability of in person appointments. The patient expressed understanding and agreed to proceed.     I discussed the assessment and treatment plan with the patient. The patient was provided an opportunity to ask questions and all were answered. The patient agreed with the plan and demonstrated an understanding of the instructions.   The patient was advised to call back or seek an in-person evaluation if the symptoms worsen or if the condition fails to improve as anticipated.   [place] MD OP Progress Note  12/03/2019 2:12 PM Evelyn Bentley  MRN:  [id number]  Chief Complaint:  Chief Complaint    Follow-up     HPI: Evelyn Bentley is a 64 year old Caucasian female who is widowed, has a history of MDD, lives at [facility] was evaluated by telemedicine today.  A video call was initiated however due to connection problem it had to be changed to a phone call.  Patient today reports she is currently making progress with regards to her depressive symptoms.  She however reports she continues to struggle with anxiety.  She reports she has a lot of worrying.  She worries about everything to the extreme and always comes up with the worst case scenario.  She reports this is getting worse since the past several months.  She reports this is keeping her restless and fidgety.  She reports sleep is good on the temazepam.  She reports her appetite as fair.  She continues to struggle with some short-term memory problems on and off.  Patient reports she is currently working with her therapist and is planning to see her therapist every 2 weeks or so.  She likes her  therapy sessions.  Discussed with patient her GeneSight testing report today.  Discussed with patient that based on GeneSight testing her medications can be readjusted.  She does not feel the Cymbalta is beneficial anymore.  We will taper her off of Cymbalta and start Elavil.  Provided her medication education.  Discussed with her drug to drug interaction especially with her clonidine as well as medications like temazepam.  She will monitor herself closely. Visit Diagnosis:    ICD-10-CM   1. MDD (major depressive disorder), recurrent, in partial remission ([place])  F33.41   2. Panic attack  F41.0   3. GAD (generalized anxiety disorder)  F41.1 amitriptyline (ELAVIL) 25 MG tablet  4. Insomnia due to mental condition  F51.05   5. Cognitive disorder  F09     Past Psychiatric History: Reviewed past psychiatric history from my progress note on 02/26/2018.  Past Medical History:  Past Medical History:  Diagnosis Date  . Anxiety   . Arthritis   . Bursitis    Rt hip  . Complication of anesthesia    nausea  . Depression   . Fibromyalgia   . GERD (gastroesophageal reflux disease)   . Hypercholesteremia   . Hypertension   . Hypothyroidism   . Thyroid disease     Past Surgical History:  Procedure Laterality Date  . ABDOMINAL HYSTERECTOMY    . CHOLECYSTECTOMY    . COLONOSCOPY WITH PROPOFOL N/A 11/13/2016   Procedure: COLONOSCOPY WITH PROPOFOL;  Surgeon: Hassell Done  Kassie Mends, MD;  Location: [facility] ENDOSCOPY;  Service: Endoscopy;  Laterality: N/A;  . ESOPHAGOGASTRODUODENOSCOPY (EGD) WITH PROPOFOL N/A 11/13/2016   Procedure: ESOPHAGOGASTRODUODENOSCOPY (EGD) WITH PROPOFOL;  Surgeon: Lollie Sails, MD;  Location: [facility] ENDOSCOPY;  Service: Endoscopy;  Laterality: N/A;  . ESOPHAGOGASTRODUODENOSCOPY (EGD) WITH PROPOFOL N/A 12/16/2018   Procedure: ESOPHAGOGASTRODUODENOSCOPY (EGD) WITH PROPOFOL;  Surgeon: Lollie Sails, MD;  Location: [facility] ENDOSCOPY;  Service: Endoscopy;  Laterality: N/A;  . LAPAROSCOPIC  HYSTERECTOMY Bilateral 02/28/2016   Procedure: HYSTERECTOMY TOTAL LAPAROSCOPIC / BSO;  Surgeon: Honor Loh Ward, MD;  Location: [facility] ORS;  Service: Gynecology;  Laterality: Bilateral;  . NECK SURGERY    . NISSEN FUNDOPLICATION    . TUBAL LIGATION    . URETEROSCOPY WITH HOLMIUM LASER LITHOTRIPSY Left 07/03/2016   Procedure: URETEROSCOPY WITH HOLMIUM LASER LITHOTRIPSY;  Surgeon: Royston Cowper, MD;  Location: [facility] ORS;  Service: Urology;  Laterality: Left;    Family Psychiatric History: I have reviewed family psychiatric history from my progress note on 02/26/2018.  Family History:  Family History  Problem Relation Age of Onset  . Depression Mother   . Anxiety disorder Mother   . Breast cancer Neg Hx     Social History: Reviewed social history from my progress note on 02/26/2018. Social History   Socioeconomic History  . Marital status: Widowed    Spouse name: Not on file  . Number of children: 2  . Years of education: Not on file  . Highest education level: High school graduate  Occupational History    Comment: fulltime  Tobacco Use  . Smoking status: Former Smoker    Start date: 11/14/1974    Quit date: 05/14/2014    Years since quitting: 5.5  . Smokeless tobacco: Never Used  Substance and Sexual Activity  . Alcohol use: Not Currently    Alcohol/week: 0.0 - 1.0 standard drinks    Comment: social rare  . Drug use: No  . Sexual activity: Yes    Partners: Male    Birth control/protection: Condom  Other Topics Concern  . Not on file  Social History Narrative  . Not on file   Social Determinants of Health   Financial Resource Strain:   . Difficulty of Paying Living Expenses: Not on file  Food Insecurity:   . Worried About Charity fundraiser in the Last Year: Not on file  . Ran Out of Food in the Last Year: Not on file  Transportation Needs:   . Lack of Transportation (Medical): Not on file  . Lack of Transportation (Non-Medical): Not on file  Physical Activity:   .  Days of Exercise per Week: Not on file  . Minutes of Exercise per Session: Not on file  Stress:   . Feeling of Stress : Not on file  Social Connections:   . Frequency of Communication with Friends and Family: Not on file  . Frequency of Social Gatherings with Friends and Family: Not on file  . Attends Religious Services: Not on file  . Active Member of Clubs or Organizations: Not on file  . Attends Archivist Meetings: Not on file  . Marital Status: Not on file    Allergies:  Allergies  Allergen Reactions  . Codeine Nausea Only    Metabolic Disorder Labs: No results found for: HGBA1C, MPG No results found for: PROLACTIN No results found for: CHOL, TRIG, HDL, CHOLHDL, VLDL, LDLCALC No results found for: TSH  Therapeutic Level Labs: No results found for: LITHIUM  No results found for: VALPROATE No components found for:  CBMZ  Current Medications: Current Outpatient Medications  Medication Sig Dispense Refill  . buPROPion (WELLBUTRIN XL) 300 MG 24 hr tablet Take 1 tablet (300 mg total) by mouth daily. 90 tablet 1  . Cholecalciferol (VITAMIN D3) 1000 units CAPS Take 1 capsule by mouth daily.    . cloNIDine (CATAPRES) 0.1 MG tablet Take 0.1 mg by mouth at bedtime.  4  . DEXILANT 60 MG capsule Take 60 mg by mouth daily.  2  . dicyclomine (BENTYL) 10 MG capsule Take by mouth.    . DULoxetine (CYMBALTA) 20 MG capsule Take 1 capsule (20 mg total) by mouth daily. TO BE TAKEN ALONG WITH THE 60 MG 90 capsule 1  . fluticasone (FLONASE) 50 MCG/ACT nasal spray fluticasone 50 mcg/actuation nasal spray,suspension    . gabapentin (NEURONTIN) 300 MG capsule TAKE 300 MG TWICE A DAY FOR ONE WEEK THEN INCREASE TO 300 MG THREE TIMES A DAY    . hydrOXYzine (ATARAX/VISTARIL) 10 MG tablet TAKE 1-2 TABLETS (10-20 MG TOTAL) BY MOUTH 2 (TWO) TIMES DAILY AS NEEDED (SEVERE ANXIETY SX). 360 tablet 3  . ibuprofen (ADVIL,MOTRIN) 600 MG tablet     . levothyroxine (SYNTHROID) 75 MCG tablet Take 75  mcg by mouth daily.    Marland Kitchen lisinopril-hydrochlorothiazide (PRINZIDE,ZESTORETIC) 10-12.5 MG tablet Take 1 tablet by mouth daily.   2  . nitrofurantoin (MACRODANTIN) 50 MG capsule Take by mouth daily.    . phenazopyridine (PYRIDIUM) 200 MG tablet     . rosuvastatin (CRESTOR) 20 MG tablet Take 20 mg by mouth daily.    . sucralfate (CARAFATE) 1 g tablet TAKE 1 TABLET (1 G TOTAL) BY MOUTH 4 (FOUR) TIMES DAILY BEFORE MEALS AND NIGHTLY    . temazepam (RESTORIL) 22.5 MG capsule Take 1 capsule (22.5 mg total) by mouth at bedtime as needed for sleep. 30 capsule 0  . VENTOLIN HFA 108 (90 Base) MCG/ACT inhaler     . Vitamin D, Ergocalciferol, (DRISDOL) 50000 units CAPS capsule TAKE 1 CAP BY MOUTH ONCE A WEEK  3  . amitriptyline (ELAVIL) 25 MG tablet Take 1 tablet (25 mg total) by mouth at bedtime. 30 tablet 1  . azelastine (ASTELIN) 0.1 % nasal spray Place 1 spray into both nostrils 2 (two) times daily.    . methylPREDNISolone (MEDROL DOSEPAK) 4 MG TBPK tablet TAKE 6 TABLETS ON DAY 1 AS DIRECTED ON PACKAGE AND DECREASE BY 1 TAB EACH DAY FOR A TOTAL OF 6 DAYS    . nitrofurantoin (MACRODANTIN) 100 MG capsule Take 100 mg by mouth 2 (two) times daily.  0  . predniSONE (DELTASONE) 10 MG tablet Take 6 tabs (60mg ) by mouth followed by 5 tabs (50mg ), then 4 tabs (40mg ), then 3 tabs (30mg ), then 2 tabs (20mg ), then 1 tab (10mg )     No current facility-administered medications for this visit.     Musculoskeletal: Strength & Muscle Tone: UTA Gait & Station: Reports as WNL Patient leans: N/A  Psychiatric Specialty Exam: Review of Systems  Psychiatric/Behavioral: The patient is nervous/anxious.   All other systems reviewed and are negative.   There were no vitals taken for this visit.There is no height or weight on file to calculate BMI.  General Appearance: UTA  Eye Contact:  UTA  Speech:  Clear and Coherent  Volume:  Normal  Mood:  Anxious  Affect:  UTA  Thought Process:  Goal Directed and Descriptions of  Associations: Intact  Orientation:  Full (  Time, Place, and Person)  Thought Content: Logical   Suicidal Thoughts:  No  Homicidal Thoughts:  No  Memory:  Immediate;   Fair Recent;   Fair Remote;   Fair  Judgement:  Fair  Insight:  Fair  Psychomotor Activity:  UTA  Concentration:  Concentration: Fair and Attention Span: Fair  Recall:  [employer] of Knowledge: Fair  Language: Fair  Akathisia:  No  Handed:  Right  AIMS (if indicated):UTA  Assets:  Communication Skills Desire for Improvement Housing Social Support  ADL's:  Intact  Cognition: WNL  Sleep:  Fair   Screenings:   Assessment and Plan: Naly is a 64 year old Caucasian female, widowed, has a history of MDD, fibromyalgia, memory changes was evaluated by telemedicine today.  Patient with psychosocial stressors including several deaths in the family, son died when he was 97 years old due to neurological problems, son who had muscular dystrophy who died at the age of 57, death of her husband and so on.  Patient continues to struggle with anxiety symptoms as well as attention focus and memory changes.  Patient will continue to benefit from medication readjustment and psychotherapy sessions.  Plan For depression-in remission Continue Wellbutrin extended release however will reduce the dosage to 300 mg p.o. daily. Continue CBT with her therapist.  Generalized anxiety disorder-unstable Start Elavil 25 mg p.o. nightly Taper of Cymbalta.  Discussed with patient to start taking Cymbalta 20 mg p.o. daily for the next 1 week and stop taking it. Hydroxyzine 20 mg p.o. daily as needed for anxiety attacks  Panic attacks-stable Continue CBT Hydroxyzine as prescribed  Insomnia-stable Temazepam 22.5 mg p.o. nightly. Discussed with patient drug to drug interaction between temazepam and Elavil.  She will monitor closely.   Cognitive changes-patient had psychological testing done-pending report.  Patient to continue psychotherapy  sessions with her therapist.  I have discussed and reviewed GeneSight testing report today with patient.  All medication changes today were made based on her GeneSight testing results.  Follow-up in clinic in 2 to 3 weeks or sooner if needed.  February 9 at 1:30 PM  I have spent atleast 30 minutes non face to face with patient today. More than 50 % of the time was spent for preparing to see the patient ( e.g., review of test, records ), obtaining and to review and separately obtained history , ordering medications and test ,psychoeducation and supportive psychotherapy and care coordination,as well as documenting clinical information in electronic health record,interpreting results of test and communication of results This note was generated in part or whole with voice recognition software. Voice recognition is usually quite accurate but there are transcription errors that can and very often do occur. I apologize for any typographical errors that were not detected and corrected.      Ursula Alert, MD 12/03/2019, 2:12 PM

## 2019-12-22 ENCOUNTER — Ambulatory Visit (INDEPENDENT_AMBULATORY_CARE_PROVIDER_SITE_OTHER): Payer: BC Managed Care – PPO | Admitting: Psychiatry

## 2019-12-22 ENCOUNTER — Other Ambulatory Visit: Payer: Self-pay

## 2019-12-22 ENCOUNTER — Encounter: Payer: Self-pay | Admitting: Psychiatry

## 2019-12-22 DIAGNOSIS — F09 Unspecified mental disorder due to known physiological condition: Secondary | ICD-10-CM

## 2019-12-22 DIAGNOSIS — F411 Generalized anxiety disorder: Secondary | ICD-10-CM

## 2019-12-22 DIAGNOSIS — F410 Panic disorder [episodic paroxysmal anxiety] without agoraphobia: Secondary | ICD-10-CM

## 2019-12-22 DIAGNOSIS — F3342 Major depressive disorder, recurrent, in full remission: Secondary | ICD-10-CM

## 2019-12-22 DIAGNOSIS — F5105 Insomnia due to other mental disorder: Secondary | ICD-10-CM | POA: Diagnosis not present

## 2019-12-22 MED ORDER — AMITRIPTYLINE HCL 50 MG PO TABS: 50.0000 mg | ORAL_TABLET | Freq: Every day | ORAL | 1 refills | Status: DC

## 2019-12-22 NOTE — Progress Notes (Signed)
Provider Location : [facility] Patient Location : Home  Virtual Visit via Video Note  I connected with Evelyn Bentley on 12/22/19 at  1:30 PM EST by a video enabled telemedicine application and verified that I am speaking with the correct person using two identifiers.   I discussed the limitations of evaluation and management by telemedicine and the availability of in person appointments. The patient expressed understanding and agreed to proceed.    I discussed the assessment and treatment plan with the patient. The patient was provided an opportunity to ask questions and all were answered. The patient agreed with the plan and demonstrated an understanding of the instructions.   The patient was advised to call back or seek an in-person evaluation if the symptoms worsen or if the condition fails to improve as anticipated.   [place] MD OP Progress Note  12/22/2019 3:51 PM Evelyn Bentley  MRN:  [id number]  Chief Complaint:  Chief Complaint    Follow-up     HPI: Evelyn Bentley is a 64 year old Caucasian female who is widowed, has a history of MDD, lives at [facility], was evaluated by telemedicine today.  Patient today reports she is currently doing well with regards to her depressive symptoms.  She does not have any sadness or crying spells.  She continues to be anxious.  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  She reports she is currently learning what she needs to be doing at her current job.  That can be overwhelming at times.  She hence feels anxious and nervous at times.  She reports the amitriptyline does not help her to fall asleep.  However once she falls asleep she is able to sleep through the night.  She stopped taking the temazepam since the combination made her too groggy.    She denies any other side effects from the amitriptyline other than feeling groggy when she wakes up in the morning.  Patient denies any suicidality, homicidality or perceptual disturbances.  She continues to be  in psychotherapy sessions and reports therapy sessions is going well. Visit Diagnosis:    ICD-10-CM   1. MDD (major depressive disorder), recurrent, in full remission ([place])  F33.42 amitriptyline (ELAVIL) 50 MG tablet  2. Panic attack  F41.0 amitriptyline (ELAVIL) 50 MG tablet  3. GAD (generalized anxiety disorder)  F41.1 amitriptyline (ELAVIL) 50 MG tablet  4. Insomnia due to mental condition  F51.05 amitriptyline (ELAVIL) 50 MG tablet  5. Cognitive disorder  F09     Past Psychiatric History: I have reviewed past psychiatric history from my progress note on 02/26/2018.  Past Medical History:  Past Medical History:  Diagnosis Date  . Anxiety   . Arthritis   . Bursitis    Rt hip  . Complication of anesthesia    nausea  . Depression   . Fibromyalgia   . GERD (gastroesophageal reflux disease)   . Hypercholesteremia   . Hypertension   . Hypothyroidism   . Thyroid disease     Past Surgical History:  Procedure Laterality Date  . ABDOMINAL HYSTERECTOMY    . CHOLECYSTECTOMY    . COLONOSCOPY WITH PROPOFOL N/A 11/13/2016   Procedure: COLONOSCOPY WITH PROPOFOL;  Surgeon: Lollie Sails, MD;  Location: [facility] ENDOSCOPY;  Service: Endoscopy;  Laterality: N/A;  . ESOPHAGOGASTRODUODENOSCOPY (EGD) WITH PROPOFOL N/A 11/13/2016   Procedure: ESOPHAGOGASTRODUODENOSCOPY (EGD) WITH PROPOFOL;  Surgeon: Lollie Sails, MD;  Location: [facility] ENDOSCOPY;  Service: Endoscopy;  Laterality: N/A;  . ESOPHAGOGASTRODUODENOSCOPY (EGD) WITH PROPOFOL N/A  12/16/2018   Procedure: ESOPHAGOGASTRODUODENOSCOPY (EGD) WITH PROPOFOL;  Surgeon: Lollie Sails, MD;  Location: [facility] ENDOSCOPY;  Service: Endoscopy;  Laterality: N/A;  . LAPAROSCOPIC HYSTERECTOMY Bilateral 02/28/2016   Procedure: HYSTERECTOMY TOTAL LAPAROSCOPIC / BSO;  Surgeon: Honor Loh Ward, MD;  Location: [facility] ORS;  Service: Gynecology;  Laterality: Bilateral;  . NECK SURGERY    . NISSEN FUNDOPLICATION    . TUBAL LIGATION    . URETEROSCOPY WITH HOLMIUM  LASER LITHOTRIPSY Left 07/03/2016   Procedure: URETEROSCOPY WITH HOLMIUM LASER LITHOTRIPSY;  Surgeon: Royston Cowper, MD;  Location: [facility] ORS;  Service: Urology;  Laterality: Left;    Family Psychiatric History: Reviewed family psychiatric history from my progress note on 02/26/2018.  Family History:  Family History  Problem Relation Age of Onset  . Depression Mother   . Anxiety disorder Mother   . Breast cancer Neg Hx     Social History: Reviewed social history from my progress note on 02/26/2018. Social History   Socioeconomic History  . Marital status: Widowed    Spouse name: Not on file  . Number of children: 2  . Years of education: Not on file  . Highest education level: High school graduate  Occupational History    Comment: fulltime  Tobacco Use  . Smoking status: Former Smoker    Start date: 11/14/1974    Quit date: 05/14/2014    Years since quitting: 5.6  . Smokeless tobacco: Never Used  Substance and Sexual Activity  . Alcohol use: Not Currently    Alcohol/week: 0.0 - 1.0 standard drinks    Comment: social rare  . Drug use: No  . Sexual activity: Yes    Partners: Male    Birth control/protection: Condom  Other Topics Concern  . Not on file  Social History Narrative  . Not on file   Social Determinants of Health   Financial Resource Strain:   . Difficulty of Paying Living Expenses: Not on file  Food Insecurity:   . Worried About Charity fundraiser in the Last Year: Not on file  . Ran Out of Food in the Last Year: Not on file  Transportation Needs:   . Lack of Transportation (Medical): Not on file  . Lack of Transportation (Non-Medical): Not on file  Physical Activity:   . Days of Exercise per Week: Not on file  . Minutes of Exercise per Session: Not on file  Stress:   . Feeling of Stress : Not on file  Social Connections:   . Frequency of Communication with Friends and Family: Not on file  . Frequency of Social Gatherings with Friends and Family: Not  on file  . Attends Religious Services: Not on file  . Active Member of Clubs or Organizations: Not on file  . Attends Archivist Meetings: Not on file  . Marital Status: Not on file    Allergies:  Allergies  Allergen Reactions  . Codeine Nausea Only    Metabolic Disorder Labs: No results found for: HGBA1C, MPG No results found for: PROLACTIN No results found for: CHOL, TRIG, HDL, CHOLHDL, VLDL, LDLCALC No results found for: TSH  Therapeutic Level Labs: No results found for: LITHIUM No results found for: VALPROATE No components found for:  CBMZ  Current Medications: Current Outpatient Medications  Medication Sig Dispense Refill  . amitriptyline (ELAVIL) 50 MG tablet Take 1 tablet (50 mg total) by mouth at bedtime. 30 tablet 1  . azelastine (ASTELIN) 0.1 % nasal spray Place 1 spray  into both nostrils 2 (two) times daily.    Marland Kitchen buPROPion (WELLBUTRIN XL) 300 MG 24 hr tablet Take 1 tablet (300 mg total) by mouth daily. 90 tablet 1  . Cholecalciferol (VITAMIN D3) 1000 units CAPS Take 1 capsule by mouth daily.    . cloNIDine (CATAPRES) 0.1 MG tablet Take 0.1 mg by mouth at bedtime.  4  . DEXILANT 60 MG capsule Take 60 mg by mouth daily.  2  . dicyclomine (BENTYL) 10 MG capsule Take by mouth.    . fluticasone (FLONASE) 50 MCG/ACT nasal spray fluticasone 50 mcg/actuation nasal spray,suspension    . gabapentin (NEURONTIN) 300 MG capsule TAKE 300 MG TWICE A DAY FOR ONE WEEK THEN INCREASE TO 300 MG THREE TIMES A DAY    . hydrOXYzine (ATARAX/VISTARIL) 10 MG tablet TAKE 1-2 TABLETS (10-20 MG TOTAL) BY MOUTH 2 (TWO) TIMES DAILY AS NEEDED (SEVERE ANXIETY SX). 360 tablet 3  . ibuprofen (ADVIL,MOTRIN) 600 MG tablet     . levothyroxine (SYNTHROID) 75 MCG tablet Take 75 mcg by mouth daily.    Marland Kitchen lisinopril-hydrochlorothiazide (PRINZIDE,ZESTORETIC) 10-12.5 MG tablet Take 1 tablet by mouth daily.   2  . methylPREDNISolone (MEDROL DOSEPAK) 4 MG TBPK tablet TAKE 6 TABLETS ON DAY 1 AS DIRECTED  ON PACKAGE AND DECREASE BY 1 TAB EACH DAY FOR A TOTAL OF 6 DAYS    . nitrofurantoin (MACRODANTIN) 100 MG capsule Take 100 mg by mouth 2 (two) times daily.  0  . nitrofurantoin (MACRODANTIN) 50 MG capsule Take by mouth daily.    . phenazopyridine (PYRIDIUM) 200 MG tablet     . predniSONE (DELTASONE) 10 MG tablet Take 6 tabs (60mg ) by mouth followed by 5 tabs (50mg ), then 4 tabs (40mg ), then 3 tabs (30mg ), then 2 tabs (20mg ), then 1 tab (10mg )    . rosuvastatin (CRESTOR) 20 MG tablet Take 20 mg by mouth daily.    . sucralfate (CARAFATE) 1 g tablet TAKE 1 TABLET (1 G TOTAL) BY MOUTH 4 (FOUR) TIMES DAILY BEFORE MEALS AND NIGHTLY    . VENTOLIN HFA 108 (90 Base) MCG/ACT inhaler     . Vitamin D, Ergocalciferol, (DRISDOL) 50000 units CAPS capsule TAKE 1 CAP BY MOUTH ONCE A WEEK  3   No current facility-administered medications for this visit.     Musculoskeletal: Strength & Muscle Tone: UTA Gait & Station: normal Patient leans: N/A  Psychiatric Specialty Exam: Review of Systems  Psychiatric/Behavioral: Positive for sleep disturbance. The patient is nervous/anxious.   All other systems reviewed and are negative.   There were no vitals taken for this visit.There is no height or weight on file to calculate BMI.  General Appearance: Casual  Eye Contact:  Fair  Speech:  Clear and Coherent  Volume:  Normal  Mood:  Anxious  Affect:  Appropriate  Thought Process:  Goal Directed and Descriptions of Associations: Intact  Orientation:  Full (Time, Place, and Person)  Thought Content: Logical   Suicidal Thoughts:  No  Homicidal Thoughts:  No  Memory:  Immediate;   Fair Recent;   Fair Remote;   Fair  Judgement:  Fair  Insight:  Fair  Psychomotor Activity:  Normal  Concentration:  Concentration: Fair and Attention Span: Fair  Recall:  [employer] of Knowledge: Fair  Language: Fair  Akathisia:  No  Handed:  Right  AIMS (if indicated): Denies tremors, rigidity  Assets:  Communication  Skills Desire for Improvement Housing Social Support  ADL's:  Intact  Cognition: WNL  Sleep:  Poor   Screenings:   Assessment and Plan: Jomara is a 64 year old Caucasian female, widowed, has a history of MDD, fibromyalgia, memory changes was evaluated by telemedicine today.  Patient with psychosocial stressors including several deaths in the family, job related stressors.  Patient is currently struggling with anxiety symptoms and sleep issues.  Plan as noted below.  Plan Depression in remission Wellbutrin extended release at reduced dosage of 300 mg p.o. daily Continue CBT with therapist  Generalized anxiety disorder-unstable Increase Elavil to 50 mg p.o. nightly Hydroxyzine 20 mg p.o. daily as needed for anxiety attacks  Panic attacks-stable Continue CBT Hydroxyzine as prescribed  Insomnia-restless Increase Elavil to 50 mg p.o. nightly.  Discussed taking it earlier than when she is taking it now to help her fall asleep at her bedtime. Discontinue temazepam due to interaction with Elavil .  Cognitive changes-patient had psychological testing done-pending report.  Follow-up in clinic in 2-3 weeks or sooner if needed.  February 24 at 1:30 PM  I have spent atleast 20 minutes non face to face with patient today. More than 50 % of the time was spent for ordering medications and test ,psychoeducation and supportive psychotherapy and care coordination,as well as documenting clinical information in electronic health record. This note was generated in part or whole with voice recognition software. Voice recognition is usually quite accurate but there are transcription errors that can and very often do occur. I apologize for any typographical errors that were not detected and corrected.       Ursula Alert, MD 12/22/2019, 3:51 PM

## 2019-12-24 ENCOUNTER — Other Ambulatory Visit: Payer: Self-pay | Admitting: Psychiatry

## 2019-12-24 DIAGNOSIS — F410 Panic disorder [episodic paroxysmal anxiety] without agoraphobia: Secondary | ICD-10-CM

## 2020-01-06 ENCOUNTER — Ambulatory Visit (INDEPENDENT_AMBULATORY_CARE_PROVIDER_SITE_OTHER): Payer: BC Managed Care – PPO | Admitting: Psychiatry

## 2020-01-06 ENCOUNTER — Other Ambulatory Visit: Payer: Self-pay

## 2020-01-06 ENCOUNTER — Encounter: Payer: Self-pay | Admitting: Psychiatry

## 2020-01-06 DIAGNOSIS — F3341 Major depressive disorder, recurrent, in partial remission: Secondary | ICD-10-CM | POA: Diagnosis not present

## 2020-01-06 DIAGNOSIS — F410 Panic disorder [episodic paroxysmal anxiety] without agoraphobia: Secondary | ICD-10-CM

## 2020-01-06 DIAGNOSIS — F5105 Insomnia due to other mental disorder: Secondary | ICD-10-CM | POA: Diagnosis not present

## 2020-01-06 DIAGNOSIS — F09 Unspecified mental disorder due to known physiological condition: Secondary | ICD-10-CM

## 2020-01-06 DIAGNOSIS — F411 Generalized anxiety disorder: Secondary | ICD-10-CM

## 2020-01-06 MED ORDER — BUPROPION HCL ER (XL) 300 MG PO TB24: 300.0000 mg | ORAL_TABLET | Freq: Every day | ORAL | 1 refills | Status: DC

## 2020-01-06 NOTE — Progress Notes (Signed)
Provider Location : [facility] Patient Location : Home   Virtual Visit via Video Note  I connected with Evelyn Bentley on 01/06/20 at  1:30 PM EST by a video enabled telemedicine application and verified that I am speaking with the correct person using two identifiers.   I discussed the limitations of evaluation and management by telemedicine and the availability of in person appointments. The patient expressed understanding and agreed to proceed.     I discussed the assessment and treatment plan with the patient. The patient was provided an opportunity to ask questions and all were answered. The patient agreed with the plan and demonstrated an understanding of the instructions.   The patient was advised to call back or seek an in-person evaluation if the symptoms worsen or if the condition fails to improve as anticipated.   [place] MD OP Progress Note  01/06/2020 6:06 PM ELIZAMARIE LAIDLER  MRN:  [id number]  Chief Complaint:  Chief Complaint    Follow-up     HPI: Evelyn Bentley is a 64 year old Caucasian female who is widowed, has a history of MDD, lives at [facility], was evaluated by telemedicine today.  Patient was late for her appointment today.  She reports mood wise she is doing okay.  She does not believe she is overtly anxious or depressed.  She also reports sleep is better on the Elavil.  She is no longer on the temazepam.  Patient however reports she continues to struggle with memory issues.  She is having difficulty during conversations and at work due to the same.  She believes it is getting worse.  Patient reports she tried to reach out to neurology and is awaiting a call back.  Patient denies any suicidality, homicidality or perceptual disturbances.  Patient denies any other concerns today. Visit Diagnosis:    ICD-10-CM   1. MDD (major depressive disorder), recurrent, in partial remission ([facility])  F33.41 buPROPion (WELLBUTRIN XL) 300 MG 24 hr tablet  2. Panic attack  F41.0   3.  GAD (generalized anxiety disorder)  F41.1   4. Insomnia due to mental condition  F51.05   5. Cognitive disorder  F09     Past Psychiatric History: I have reviewed past psychiatric history from my progress note on 02/26/2018.  Past Medical History:  Past Medical History:  Diagnosis Date  . Anxiety   . Arthritis   . Bursitis    Rt hip  . Complication of anesthesia    nausea  . Depression   . Fibromyalgia   . GERD (gastroesophageal reflux disease)   . Hypercholesteremia   . Hypertension   . Hypothyroidism   . Thyroid disease     Past Surgical History:  Procedure Laterality Date  . ABDOMINAL HYSTERECTOMY    . CHOLECYSTECTOMY    . COLONOSCOPY WITH PROPOFOL N/A 11/13/2016   Procedure: COLONOSCOPY WITH PROPOFOL;  Surgeon: Lollie Sails, MD;  Location[facility] ENDOSCOPY;  Service: Endoscopy;  Laterality: N/A;  . ESOPHAGOGASTRODUODENOSCOPY (EGD) WITH PROPOFOL N/A 11/13/2016   Procedure: ESOPHAGOGASTRODUODENOSCOPY (EGD) WITH PROPOFOL;  Surgeon: Lollie Sails, MD;  Location: [facility] ENDOSCOPY;  Service: Endoscopy;  Laterality: N/A;  . ESOPHAGOGASTRODUODENOSCOPY (EGD) WITH PROPOFOL N/A 12/16/2018   Procedure: ESOPHAGOGASTRODUODENOSCOPY (EGD) WITH PROPOFOL;  Surgeon: Lollie Sails, MD;  Location: [facility] ENDOSCOPY;  Service: Endoscopy;  Laterality: N/A;  . LAPAROSCOPIC HYSTERECTOMY Bilateral 02/28/2016   Procedure: HYSTERECTOMY TOTAL LAPAROSCOPIC / BSO;  Surgeon: Honor Loh Ward, MD;  Location: [facility] ORS;  Service: Gynecology;  Laterality: Bilateral;  .  NECK SURGERY    . NISSEN FUNDOPLICATION    . TUBAL LIGATION    . URETEROSCOPY WITH HOLMIUM LASER LITHOTRIPSY Left 07/03/2016   Procedure: URETEROSCOPY WITH HOLMIUM LASER LITHOTRIPSY;  Surgeon: Royston Cowper, MD;  Location: [facility] ORS;  Service: Urology;  Laterality: Left;    Family Psychiatric History: I have reviewed family psychiatric history from my progress note on 02/26/2018. Family History:  Family History  Problem Relation Age of Onset   . Depression Mother   . Anxiety disorder Mother   . Breast cancer Neg Hx     Social History: I have reviewed social history from my progress note on 02/26/2018. Social History   Socioeconomic History  . Marital status: Widowed    Spouse name: Not on file  . Number of children: 2  . Years of education: Not on file  . Highest education level: High school graduate  Occupational History    Comment: fulltime  Tobacco Use  . Smoking status: Former Smoker    Start date: 11/14/1974    Quit date: 05/14/2014    Years since quitting: 5.6  . Smokeless tobacco: Never Used  Substance and Sexual Activity  . Alcohol use: Not Currently    Alcohol/week: 0.0 - 1.0 standard drinks    Comment: social rare  . Drug use: No  . Sexual activity: Yes    Partners: Male    Birth control/protection: Condom  Other Topics Concern  . Not on file  Social History Narrative  . Not on file   Social Determinants of Health   Financial Resource Strain:   . Difficulty of Paying Living Expenses: Not on file  Food Insecurity:   . Worried About Charity fundraiser in the Last Year: Not on file  . Ran Out of Food in the Last Year: Not on file  Transportation Needs:   . Lack of Transportation (Medical): Not on file  . Lack of Transportation (Non-Medical): Not on file  Physical Activity:   . Days of Exercise per Week: Not on file  . Minutes of Exercise per Session: Not on file  Stress:   . Feeling of Stress : Not on file  Social Connections:   . Frequency of Communication with Friends and Family: Not on file  . Frequency of Social Gatherings with Friends and Family: Not on file  . Attends Religious Services: Not on file  . Active Member of Clubs or Organizations: Not on file  . Attends Archivist Meetings: Not on file  . Marital Status: Not on file    Allergies:  Allergies  Allergen Reactions  . Codeine Nausea Only    Metabolic Disorder Labs: No results found for: HGBA1C, MPG No results  found for: PROLACTIN No results found for: CHOL, TRIG, HDL, CHOLHDL, VLDL, LDLCALC No results found for: TSH  Therapeutic Level Labs: No results found for: LITHIUM No results found for: VALPROATE No components found for:  CBMZ  Current Medications: Current Outpatient Medications  Medication Sig Dispense Refill  . amitriptyline (ELAVIL) 50 MG tablet Take 1 tablet (50 mg total) by mouth at bedtime. 30 tablet 1  . azelastine (ASTELIN) 0.1 % nasal spray Place 1 spray into both nostrils 2 (two) times daily.    Marland Kitchen buPROPion (WELLBUTRIN XL) 300 MG 24 hr tablet Take 1 tablet (300 mg total) by mouth daily. 90 tablet 1  . Cholecalciferol (VITAMIN D3) 1000 units CAPS Take 1 capsule by mouth daily.    . cloNIDine (CATAPRES) 0.1 MG  tablet Take 0.1 mg by mouth at bedtime.  4  . DEXILANT 60 MG capsule Take 60 mg by mouth daily.  2  . dicyclomine (BENTYL) 10 MG capsule Take by mouth.    . fluticasone (FLONASE) 50 MCG/ACT nasal spray fluticasone 50 mcg/actuation nasal spray,suspension    . gabapentin (NEURONTIN) 300 MG capsule TAKE 300 MG TWICE A DAY FOR ONE WEEK THEN INCREASE TO 300 MG THREE TIMES A DAY    . hydrOXYzine (ATARAX/VISTARIL) 10 MG tablet TAKE 1-2 TABLETS (10-20 MG TOTAL) BY MOUTH 2 (TWO) TIMES DAILY AS NEEDED (SEVERE ANXIETY SX). 360 tablet 3  . ibuprofen (ADVIL,MOTRIN) 600 MG tablet     . levothyroxine (SYNTHROID) 75 MCG tablet Take 75 mcg by mouth daily.    Marland Kitchen lisinopril-hydrochlorothiazide (PRINZIDE,ZESTORETIC) 10-12.5 MG tablet Take 1 tablet by mouth daily.   2  . methylPREDNISolone (MEDROL DOSEPAK) 4 MG TBPK tablet TAKE 6 TABLETS ON DAY 1 AS DIRECTED ON PACKAGE AND DECREASE BY 1 TAB EACH DAY FOR A TOTAL OF 6 DAYS    . nitrofurantoin (MACRODANTIN) 100 MG capsule Take 100 mg by mouth 2 (two) times daily.  0  . nitrofurantoin (MACRODANTIN) 50 MG capsule Take by mouth daily.    . phenazopyridine (PYRIDIUM) 200 MG tablet     . predniSONE (DELTASONE) 10 MG tablet Take 6 tabs (60mg ) by mouth  followed by 5 tabs (50mg ), then 4 tabs (40mg ), then 3 tabs (30mg ), then 2 tabs (20mg ), then 1 tab (10mg )    . rosuvastatin (CRESTOR) 20 MG tablet Take 20 mg by mouth daily.    . sucralfate (CARAFATE) 1 g tablet TAKE 1 TABLET (1 G TOTAL) BY MOUTH 4 (FOUR) TIMES DAILY BEFORE MEALS AND NIGHTLY    . VENTOLIN HFA 108 (90 Base) MCG/ACT inhaler     . Vitamin D, Ergocalciferol, (DRISDOL) 50000 units CAPS capsule TAKE 1 CAP BY MOUTH ONCE A WEEK  3   No current facility-administered medications for this visit.     Musculoskeletal: Strength & Muscle Tone: UTA Gait & Station: normal Patient leans: N/A  Psychiatric Specialty Exam: Review of Systems  Psychiatric/Behavioral: The patient is nervous/anxious.   All other systems reviewed and are negative.   There were no vitals taken for this visit.There is no height or weight on file to calculate BMI.  General Appearance: Casual  Eye Contact:  Fair  Speech:  Normal Rate  Volume:  Normal  Mood:  Anxious improving  Affect:  Appropriate  Thought Process:  Goal Directed and Descriptions of Associations: Intact  Orientation:  Full (Time, Place, and Person)  Thought Content: Logical   Suicidal Thoughts:  No  Homicidal Thoughts:  No  Memory:  Immediate;   Fair Recent;   Fair Remote;   Fair short term memory issues   Judgement:  Fair  Insight:  Fair  Psychomotor Activity:  Normal  Concentration:  Concentration: Fair and Attention Span: Fair  Recall:  [employer] of Knowledge: Fair  Language: Fair  Akathisia:  No  Handed:  Right  AIMS (if indicated):   Assets:  Communication Skills Desire for Improvement Housing Social Support  ADL's:  Intact  Cognition: WNL  Sleep:  Fair   Screenings:   Assessment and Plan: Jiliana is a 64 year old Caucasian female, widowed, has a history of MDD, fibromyalgia, memory changes was evaluated by telemedicine today.  Patient with psychosocial stressors of recent job related problems and pandemic continues to  struggle with memory issues although she reports anxiety and  depression as under control.  Discussed plan as noted below.  Plan Depression in remission Wellbutrin extended release at reduced dosage of 300 mg p.o. daily Continue CBT with therapist  GAD-stable Elavil 50 mg p.o. nightly Hydroxyzine 20 mg p.o. daily as needed for anxiety attacks  Panic attacks-stable Continue CBT Hydroxyzine as prescribed  Insomnia-stable Elavil 50 mg p.o. nightly  Cognitive changes-patient had psychological testing done-I have asked patient to sign a release to obtain medical records still pending. Patient however reports she continues to follow-up with her psychologist on a regular basis. She will continue to follow-up with psychologist and neurologist.  Follow-up in clinic in 3 to 4 weeks or sooner if needed.  March 25 at 3:20 PM  I have spent atleast 20 minutes non face to face with patient today. More than 50 % of the time was spent for preparing to see the patient ( e.g., review of test, records ), obtaining and to review and separately obtained history , ordering medications and test ,psychoeducation and supportive psychotherapy and care coordination,as well as documenting clinical information in electronic health record,interpreting results of test and communication of results This note was generated in part or whole with voice recognition software. Voice recognition is usually quite accurate but there are transcription errors that can and very often do occur. I apologize for any typographical errors that were not detected and corrected.        Ursula Alert, MD 01/06/2020, 6:06 PM

## 2020-01-10 ENCOUNTER — Other Ambulatory Visit: Payer: Self-pay | Admitting: Psychiatry

## 2020-01-10 DIAGNOSIS — F410 Panic disorder [episodic paroxysmal anxiety] without agoraphobia: Secondary | ICD-10-CM

## 2020-01-13 ENCOUNTER — Other Ambulatory Visit: Payer: Self-pay | Admitting: Acute Care

## 2020-01-13 DIAGNOSIS — R200 Anesthesia of skin: Secondary | ICD-10-CM | POA: Insufficient documentation

## 2020-01-13 DIAGNOSIS — R202 Paresthesia of skin: Secondary | ICD-10-CM | POA: Insufficient documentation

## 2020-01-13 DIAGNOSIS — R4189 Other symptoms and signs involving cognitive functions and awareness: Secondary | ICD-10-CM

## 2020-01-15 ENCOUNTER — Other Ambulatory Visit: Payer: Self-pay | Admitting: Psychiatry

## 2020-01-15 DIAGNOSIS — F5105 Insomnia due to other mental disorder: Secondary | ICD-10-CM

## 2020-01-15 DIAGNOSIS — F410 Panic disorder [episodic paroxysmal anxiety] without agoraphobia: Secondary | ICD-10-CM

## 2020-01-15 DIAGNOSIS — F411 Generalized anxiety disorder: Secondary | ICD-10-CM

## 2020-01-15 DIAGNOSIS — F3342 Major depressive disorder, recurrent, in full remission: Secondary | ICD-10-CM

## 2020-01-20 ENCOUNTER — Telehealth: Payer: Self-pay

## 2020-01-20 NOTE — Telephone Encounter (Signed)
Returned call to patient however it went into voicemail. Left voicemail message for patient to reduce the amitriptyline to half tablet if she is having side effects.  Advised patient to call the clinic back up to schedule a sooner appointment.

## 2020-01-20 NOTE — Telephone Encounter (Signed)
Patient called regarding her Amitriptyline 50mg . She stated that she's in a "brain fog" with the medication. She also stated that she can't concentrate and feels "detached." She stated that the Hydroxyzine 10mg  is not helping and that she's still having bad panic attacks. Please review and advise. Thank you.

## 2020-01-25 ENCOUNTER — Ambulatory Visit
Admission: RE | Admit: 2020-01-25 | Discharge: 2020-01-25 | Disposition: A | Discharge status: Home / Self Care | Payer: BC Managed Care – PPO | Source: Ambulatory Visit | Attending: Acute Care | Admitting: Acute Care

## 2020-01-25 ENCOUNTER — Other Ambulatory Visit: Payer: Self-pay

## 2020-01-25 DIAGNOSIS — R4189 Other symptoms and signs involving cognitive functions and awareness: Secondary | ICD-10-CM | POA: Diagnosis not present

## 2020-02-04 ENCOUNTER — Ambulatory Visit (INDEPENDENT_AMBULATORY_CARE_PROVIDER_SITE_OTHER): Payer: BC Managed Care – PPO | Admitting: Psychiatry

## 2020-02-04 ENCOUNTER — Other Ambulatory Visit: Payer: Self-pay

## 2020-02-04 ENCOUNTER — Encounter: Payer: Self-pay | Admitting: Psychiatry

## 2020-02-04 DIAGNOSIS — F09 Unspecified mental disorder due to known physiological condition: Secondary | ICD-10-CM

## 2020-02-04 DIAGNOSIS — F3342 Major depressive disorder, recurrent, in full remission: Secondary | ICD-10-CM | POA: Diagnosis not present

## 2020-02-04 DIAGNOSIS — F410 Panic disorder [episodic paroxysmal anxiety] without agoraphobia: Secondary | ICD-10-CM

## 2020-02-04 DIAGNOSIS — F411 Generalized anxiety disorder: Secondary | ICD-10-CM

## 2020-02-04 DIAGNOSIS — F5105 Insomnia due to other mental disorder: Secondary | ICD-10-CM

## 2020-02-04 MED ORDER — AMITRIPTYLINE HCL 50 MG PO TABS: 25.0000 mg | ORAL_TABLET | Freq: Every day | ORAL | 1 refills | Status: DC

## 2020-02-04 MED ORDER — BUSPIRONE HCL 10 MG PO TABS: 10.0000 mg | ORAL_TABLET | Freq: Two times a day (BID) | ORAL | 1 refills | Status: DC

## 2020-02-04 NOTE — Progress Notes (Signed)
Provider Location : [facility] Patient Location : Home  Virtual Visit via Video Note  I connected with Evelyn Bentley on 02/04/20 at  3:30 PM EDT by a video enabled telemedicine application and verified that I am speaking with the correct person using two identifiers.   I discussed the limitations of evaluation and management by telemedicine and the availability of in person appointments. The patient expressed understanding and agreed to proceed.    I discussed the assessment and treatment plan with the patient. The patient was provided an opportunity to ask questions and all were answered. The patient agreed with the plan and demonstrated an understanding of the instructions.   The patient was advised to call back or seek an in-person evaluation if the symptoms worsen or if the condition fails to improve as anticipated.   Burnham MD OP Progress Note  02/04/2020 5:24 PM [name]  MRN:  [id number]  Chief Complaint:  Chief Complaint    Follow-up     HPI: Evelyn Bentley is a 64 year old Caucasian female who is widowed, has a history of MDD, lives at [facility] was evaluated by telemedicine today.  Patient today attempted to connect by video however it had to be changed to a phone call due to connection problem.  Patient today reports she is currently struggling with anxiety symptoms.  Her depressive symptoms have improved.  She in fact does not feel depressed anymore.  She however reports she continues to be worried about the stressors at work which keeps her anxious.  The Elavil does help to some extent.  She is also in psychotherapy sessions which helps.  She reports sleep as good on the Elavil.  She however reports she has been struggling with severe dry mouth.  Her primary care provider took her off of the clonidine thinking that could have been causing it.  She however reports in spite of that her dry mouth continues to be a problem.  She now wonders whether her dry mouth worsened after the  Elavil dosage was increased.  She is currently on 50 mg.  Patient denies any suicidality, homicidality or perceptual disturbances.  She continues to follow-up with her neurologist, had her MRI done for memory issues.  She reports that it came back within normal limits.  Patient denies any other concerns today. Visit Diagnosis:    ICD-10-CM   1. MDD (major depressive disorder), recurrent, in full remission ([place])  F33.42   2. Panic attack  F41.0 busPIRone (BUSPAR) 10 MG tablet    amitriptyline (ELAVIL) 50 MG tablet  3. GAD (generalized anxiety disorder)  F41.1 busPIRone (BUSPAR) 10 MG tablet    amitriptyline (ELAVIL) 50 MG tablet  4. Insomnia due to mental condition  F51.05 amitriptyline (ELAVIL) 50 MG tablet  5. Cognitive disorder  F09     Past Psychiatric History: I have reviewed past psychiatric history from my progress note on 02/26/2018.  Past Medical History:  Past Medical History:  Diagnosis Date  . Anxiety   . Arthritis   . Bursitis    Rt hip  . Complication of anesthesia    nausea  . Depression   . Fibromyalgia   . GERD (gastroesophageal reflux disease)   . Hypercholesteremia   . Hypertension   . Hypothyroidism   . Thyroid disease     Past Surgical History:  Procedure Laterality Date  . ABDOMINAL HYSTERECTOMY    . CHOLECYSTECTOMY    . COLONOSCOPY WITH PROPOFOL N/A 11/13/2016   Procedure: COLONOSCOPY WITH PROPOFOL;  Surgeon: Lollie Sails, MD;  Location: [facility] ENDOSCOPY;  Service: Endoscopy;  Laterality: N/A;  . ESOPHAGOGASTRODUODENOSCOPY (EGD) WITH PROPOFOL N/A 11/13/2016   Procedure: ESOPHAGOGASTRODUODENOSCOPY (EGD) WITH PROPOFOL;  Surgeon: Lollie Sails, MD;  Location: [facility] ENDOSCOPY;  Service: Endoscopy;  Laterality: N/A;  . ESOPHAGOGASTRODUODENOSCOPY (EGD) WITH PROPOFOL N/A 12/16/2018   Procedure: ESOPHAGOGASTRODUODENOSCOPY (EGD) WITH PROPOFOL;  Surgeon: Lollie Sails, MD;  Location: [facility] ENDOSCOPY;  Service: Endoscopy;  Laterality: N/A;  . LAPAROSCOPIC  HYSTERECTOMY Bilateral 02/28/2016   Procedure: HYSTERECTOMY TOTAL LAPAROSCOPIC / BSO;  Surgeon: Honor Loh Ward, MD;  Location: [facility] ORS;  Service: Gynecology;  Laterality: Bilateral;  . NECK SURGERY    . NISSEN FUNDOPLICATION    . TUBAL LIGATION    . URETEROSCOPY WITH HOLMIUM LASER LITHOTRIPSY Left 07/03/2016   Procedure: URETEROSCOPY WITH HOLMIUM LASER LITHOTRIPSY;  Surgeon: Royston Cowper, MD;  Location: [facility] ORS;  Service: Urology;  Laterality: Left;    Family Psychiatric History: I have reviewed family psychiatric history from my progress note on 02/26/2018.  Family History:  Family History  Problem Relation Age of Onset  . Depression Mother   . Anxiety disorder Mother   . Breast cancer Neg Hx     Social History: I have reviewed social history from my progress note on 02/26/2018. Social History   Socioeconomic History  . Marital status: Widowed    Spouse name: Not on file  . Number of children: 2  . Years of education: Not on file  . Highest education level: High school graduate  Occupational History    Comment: fulltime  Tobacco Use  . Smoking status: Former Smoker    Start date: 11/14/1974    Quit date: 05/14/2014    Years since quitting: 5.7  . Smokeless tobacco: Never Used  Substance and Sexual Activity  . Alcohol use: Not Currently    Alcohol/week: 0.0 - 1.0 standard drinks    Comment: social rare  . Drug use: No  . Sexual activity: Yes    Partners: Male    Birth control/protection: Condom  Other Topics Concern  . Not on file  Social History Narrative  . Not on file   Social Determinants of Health   Financial Resource Strain:   . Difficulty of Paying Living Expenses:   Food Insecurity:   . Worried About Charity fundraiser in the Last Year:   . Arboriculturist in the Last Year:   Transportation Needs:   . Film/video editor (Medical):   Marland Kitchen Lack of Transportation (Non-Medical):   Physical Activity:   . Days of Exercise per Week:   . Minutes of Exercise  per Session:   Stress:   . Feeling of Stress :   Social Connections:   . Frequency of Communication with Friends and Family:   . Frequency of Social Gatherings with Friends and Family:   . Attends Religious Services:   . Active Member of Clubs or Organizations:   . Attends Archivist Meetings:   Marland Kitchen Marital Status:     Allergies:  Allergies  Allergen Reactions  . Codeine Nausea Only    Metabolic Disorder Labs: No results found for: HGBA1C, MPG No results found for: PROLACTIN No results found for: CHOL, TRIG, HDL, CHOLHDL, VLDL, LDLCALC No results found for: TSH  Therapeutic Level Labs: No results found for: LITHIUM No results found for: VALPROATE No components found for:  CBMZ  Current Medications: Current Outpatient Medications  Medication Sig Dispense Refill  .  amitriptyline (ELAVIL) 50 MG tablet Take 0.5 tablets (25 mg total) by mouth at bedtime. DOSE REDUCTION 90 tablet 1  . azelastine (ASTELIN) 0.1 % nasal spray Place 1 spray into both nostrils 2 (two) times daily.    Marland Kitchen buPROPion (WELLBUTRIN XL) 300 MG 24 hr tablet Take 1 tablet (300 mg total) by mouth daily. 90 tablet 1  . busPIRone (BUSPAR) 10 MG tablet Take 1 tablet (10 mg total) by mouth 2 (two) times daily. 60 tablet 1  . Cholecalciferol (VITAMIN D3) 1000 units CAPS Take 1 capsule by mouth daily.    . cloNIDine (CATAPRES) 0.1 MG tablet Take 0.1 mg by mouth at bedtime.  4  . DEXILANT 60 MG capsule Take 60 mg by mouth daily.  2  . dicyclomine (BENTYL) 10 MG capsule Take by mouth.    . fluticasone (FLONASE) 50 MCG/ACT nasal spray fluticasone 50 mcg/actuation nasal spray,suspension    . gabapentin (NEURONTIN) 300 MG capsule TAKE 300 MG TWICE A DAY FOR ONE WEEK THEN INCREASE TO 300 MG THREE TIMES A DAY    . hydrOXYzine (ATARAX/VISTARIL) 10 MG tablet TAKE 1-2 TABLETS (10-20 MG TOTAL) BY MOUTH 2 (TWO) TIMES DAILY AS NEEDED (SEVERE ANXIETY SX). 360 tablet 3  . ibuprofen (ADVIL,MOTRIN) 600 MG tablet     .  levothyroxine (SYNTHROID) 88 MCG tablet Take 88 mcg by mouth daily.    Marland Kitchen lisinopril-hydrochlorothiazide (PRINZIDE,ZESTORETIC) 10-12.5 MG tablet Take 1 tablet by mouth daily.   2  . methylPREDNISolone (MEDROL DOSEPAK) 4 MG TBPK tablet TAKE 6 TABLETS ON DAY 1 AS DIRECTED ON PACKAGE AND DECREASE BY 1 TAB EACH DAY FOR A TOTAL OF 6 DAYS    . nitrofurantoin (MACRODANTIN) 100 MG capsule Take 100 mg by mouth 2 (two) times daily.  0  . nitrofurantoin (MACRODANTIN) 50 MG capsule Take by mouth daily.    . phenazopyridine (PYRIDIUM) 200 MG tablet     . predniSONE (DELTASONE) 10 MG tablet Take 6 tabs (60mg ) by mouth followed by 5 tabs (50mg ), then 4 tabs (40mg ), then 3 tabs (30mg ), then 2 tabs (20mg ), then 1 tab (10mg )    . rosuvastatin (CRESTOR) 20 MG tablet Take 20 mg by mouth daily.    . sucralfate (CARAFATE) 1 g tablet TAKE 1 TABLET (1 G TOTAL) BY MOUTH 4 (FOUR) TIMES DAILY BEFORE MEALS AND NIGHTLY    . VENTOLIN HFA 108 (90 Base) MCG/ACT inhaler     . Vitamin D, Ergocalciferol, (DRISDOL) 50000 units CAPS capsule TAKE 1 CAP BY MOUTH ONCE A WEEK  3   No current facility-administered medications for this visit.     Musculoskeletal: Strength & Muscle Tone: UTA Gait & Station: normal Patient leans: N/A  Psychiatric Specialty Exam: Review of Systems  HENT:       Dry mouth  Psychiatric/Behavioral: The patient is nervous/anxious.   All other systems reviewed and are negative.   There were no vitals taken for this visit.There is no height or weight on file to calculate BMI.  General Appearance: UTA  Eye Contact:  UTA  Speech:  Clear and Coherent  Volume:  Normal  Mood:  Anxious  Affect:  UTA  Thought Process:  Goal Directed and Descriptions of Associations: Intact  Orientation:  Full (Time, Place, and Person)  Thought Content: Logical   Suicidal Thoughts:  No  Homicidal Thoughts:  No  Memory:  Immediate;   Fair Recent;   Fair Remote;   Fair  Judgement:  Fair  Insight:  Fair  Psychomotor  Activity:  UTA  Concentration:  Concentration: Fair and Attention Span: Fair  Recall:  [employer] of Knowledge: Fair  Language: Fair  Akathisia:  No  Handed:  Right  AIMS (if indicated): UTA  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  Patient with psychosocial stressors of recent job related problems and pandemic continues to struggle with anxiety as well as possible adverse side effects to her medication.  Plan as noted below.  Plan Depression in remission Wellbutrin extended release at reduced dosage of 300 mg p.o. daily. Continue CBT with her therapist.  GAD-unstable Reduce Elavil to 25 mg p.o. nightly for side effects of dry mouth. Start BuSpar 10 mg p.o. twice daily. Hydroxyzine 20 mg p.o. daily as needed for anxiety attacks  Panic attacks-stable Continue CBT Hydroxyzine as prescribed  Insomnia-stable Elavil as prescribed however the dosage is being reduced due to her dry mouth. Patient to monitor herself.  Cognitive changes-patient had psychological testing done-pending report. Patient however reports she continues to follow-up with her psychologist as well as neurologist.  She had recent MRI done. I have reviewed recent MRI brain without contrast 2-3 15 2021-unremarkable.  GeneSight testing results were reviewed while making medication changes.  Follow-up in clinic in 3 weeks or sooner if needed.  I have spent atleast 20 minutes non face to face with patient today. More than 50 % of the time was spent for preparing to see the patient ( e.g., review of test, records ), obtaining and to review and separately obtained history , ordering medications and test ,psychoeducation and supportive  psychotherapy and care coordination,as well as documenting clinical information in electronic health record. This note was generated in part or whole with voice recognition software. Voice recognition is usually quite accurate but there are transcription errors that can and very often do occur. I apologize for any typographical errors that were not detected and corrected.       Ursula Alert, MD 02/04/2020, 5:24 PM

## 2020-02-26 ENCOUNTER — Other Ambulatory Visit: Payer: Self-pay | Admitting: Psychiatry

## 2020-02-26 DIAGNOSIS — F09 Unspecified mental disorder due to known physiological condition: Secondary | ICD-10-CM

## 2020-02-29 ENCOUNTER — Telehealth: Payer: Self-pay

## 2020-02-29 DIAGNOSIS — F410 Panic disorder [episodic paroxysmal anxiety] without agoraphobia: Secondary | ICD-10-CM

## 2020-02-29 DIAGNOSIS — F411 Generalized anxiety disorder: Secondary | ICD-10-CM

## 2020-02-29 MED ORDER — BUSPIRONE HCL 10 MG PO TABS: 10.0000 mg | ORAL_TABLET | Freq: Two times a day (BID) | ORAL | 1 refills | Status: DC

## 2020-02-29 NOTE — Telephone Encounter (Signed)
I have sent BuSpar to pharmacy. 

## 2020-02-29 NOTE — Telephone Encounter (Signed)
received a request for a 90 day refill for buspirone hcl 10mg . pt has appt on  03-02-20

## 2020-03-02 ENCOUNTER — Telehealth (INDEPENDENT_AMBULATORY_CARE_PROVIDER_SITE_OTHER): Payer: BC Managed Care – PPO | Admitting: Psychiatry

## 2020-03-02 ENCOUNTER — Encounter: Payer: Self-pay | Admitting: Psychiatry

## 2020-03-02 ENCOUNTER — Other Ambulatory Visit: Payer: Self-pay

## 2020-03-02 DIAGNOSIS — F411 Generalized anxiety disorder: Secondary | ICD-10-CM

## 2020-03-02 DIAGNOSIS — F410 Panic disorder [episodic paroxysmal anxiety] without agoraphobia: Secondary | ICD-10-CM | POA: Diagnosis not present

## 2020-03-02 DIAGNOSIS — F3342 Major depressive disorder, recurrent, in full remission: Secondary | ICD-10-CM | POA: Diagnosis not present

## 2020-03-02 DIAGNOSIS — F5105 Insomnia due to other mental disorder: Secondary | ICD-10-CM | POA: Diagnosis not present

## 2020-03-02 DIAGNOSIS — F09 Unspecified mental disorder due to known physiological condition: Secondary | ICD-10-CM

## 2020-03-02 MED ORDER — BUSPIRONE HCL 10 MG PO TABS: 10.0000 mg | ORAL_TABLET | Freq: Two times a day (BID) | ORAL | 0 refills | Status: DC

## 2020-03-02 MED ORDER — AMITRIPTYLINE HCL 25 MG PO TABS: 25.0000 mg | ORAL_TABLET | Freq: Every day | ORAL | 0 refills | Status: DC

## 2020-03-02 NOTE — Patient Instructions (Signed)
Follow-up in clinic on June 8, 4 PM

## 2020-03-02 NOTE — Progress Notes (Signed)
Provider Location : [facility] Patient Location : Work  Virtual Visit via Video Note  I connected with Laderricka Moland Rucci on 03/02/20 at  4:00 PM EDT by a video enabled telemedicine application and verified that I am speaking with the correct person using two identifiers.   I discussed the limitations of evaluation and management by telemedicine and the availability of in person appointments. The patient expressed understanding and agreed to proceed.    I discussed the assessment and treatment plan with the patient. The patient was provided an opportunity to ask questions and all were answered. The patient agreed with the plan and demonstrated an understanding of the instructions.   The patient was advised to call back or seek an in-person evaluation if the symptoms worsen or if the condition fails to improve as anticipated.   [place] MD OP Progress Note  03/02/2020 4:24 PM KENEDIE GAJDA  MRN:  [id number]  Chief Complaint:  Chief Complaint    Follow-up     HPI: Makiaya is a 64 year old Caucasian female who is widowed, has a history of MDD, panic attacks, GAD, cognitive disorder, lives at [facility] was evaluated by telemedicine today.  Patient today reports she is currently making progress with regards to her mood symptoms.  She reports her anxiety is more under control.  She is compliant on the Elavil as prescribed.  She is tolerating the lower dosage of Elavil and denies side effects.  She is compliant on her other medications and denies side effects.  She reports sleep is good.  She reports cognitively she feels much better than before.  She does not have any significant memory issues.  She is able to focus at work and is able to cope with work-related stressors.  She continues to follow-up with her therapist and reports therapy sessions as beneficial.  She denies any suicidality, homicidality or perceptual disturbances.  Patient denies any other concerns today.   Visit Diagnosis:     ICD-10-CM   1. MDD (major depressive disorder), recurrent, in full remission ([place])  F33.42   2. Panic attack  F41.0 busPIRone (BUSPAR) 10 MG tablet    amitriptyline (ELAVIL) 25 MG tablet  3. GAD (generalized anxiety disorder)  F41.1 busPIRone (BUSPAR) 10 MG tablet    amitriptyline (ELAVIL) 25 MG tablet  4. Insomnia due to mental condition  F51.05   5. Cognitive disorder  F09     Past Psychiatric History: I have reviewed past psychiatric history from my progress note on 02/26/2018  Past Medical History:  Past Medical History:  Diagnosis Date  . Anxiety   . Arthritis   . Bursitis    Rt hip  . Complication of anesthesia    nausea  . Depression   . Fibromyalgia   . GERD (gastroesophageal reflux disease)   . Hypercholesteremia   . Hypertension   . Hypothyroidism   . Thyroid disease     Past Surgical History:  Procedure Laterality Date  . ABDOMINAL HYSTERECTOMY    . CHOLECYSTECTOMY    . COLONOSCOPY WITH PROPOFOL N/A 11/13/2016   Procedure: COLONOSCOPY WITH PROPOFOL;  Surgeon: Lollie Sails, MD;  Location: [facility] ENDOSCOPY;  Service: Endoscopy;  Laterality: N/A;  . ESOPHAGOGASTRODUODENOSCOPY (EGD) WITH PROPOFOL N/A 11/13/2016   Procedure: ESOPHAGOGASTRODUODENOSCOPY (EGD) WITH PROPOFOL;  Surgeon: Lollie Sails, MD;  Location: [facility] ENDOSCOPY;  Service: Endoscopy;  Laterality: N/A;  . ESOPHAGOGASTRODUODENOSCOPY (EGD) WITH PROPOFOL N/A 12/16/2018   Procedure: ESOPHAGOGASTRODUODENOSCOPY (EGD) WITH PROPOFOL;  Surgeon: Lollie Sails, MD;  Location: [facility] ENDOSCOPY;  Service: Endoscopy;  Laterality: N/A;  . LAPAROSCOPIC HYSTERECTOMY Bilateral 02/28/2016   Procedure: HYSTERECTOMY TOTAL LAPAROSCOPIC / BSO;  Surgeon: Honor Loh Ward, MD;  Location: [facility] ORS;  Service: Gynecology;  Laterality: Bilateral;  . NECK SURGERY    . NISSEN FUNDOPLICATION    . TUBAL LIGATION    . URETEROSCOPY WITH HOLMIUM LASER LITHOTRIPSY Left 07/03/2016   Procedure: URETEROSCOPY WITH HOLMIUM LASER LITHOTRIPSY;   Surgeon: Royston Cowper, MD;  Location: [facility] ORS;  Service: Urology;  Laterality: Left;    Family Psychiatric History: I have reviewed family psychiatric history from my progress note on 02/26/2018  Family History:  Family History  Problem Relation Age of Onset  . Depression Mother   . Anxiety disorder Mother   . Breast cancer Neg Hx     Social History: I have reviewed social history from my progress note on 02/26/2018 Social History   Socioeconomic History  . Marital status: Widowed    Spouse name: Not on file  . Number of children: 2  . Years of education: Not on file  . Highest education level: High school graduate  Occupational History    Comment: fulltime  Tobacco Use  . Smoking status: Former Smoker    Start date: 11/14/1974    Quit date: 05/14/2014    Years since quitting: 5.8  . Smokeless tobacco: Never Used  Substance and Sexual Activity  . Alcohol use: Not Currently    Alcohol/week: 0.0 - 1.0 standard drinks    Comment: social rare  . Drug use: No  . Sexual activity: Yes    Partners: Male    Birth control/protection: Condom  Other Topics Concern  . Not on file  Social History Narrative  . Not on file   Social Determinants of Health   Financial Resource Strain:   . Difficulty of Paying Living Expenses:   Food Insecurity:   . Worried About Charity fundraiser in the Last Year:   . Arboriculturist in the Last Year:   Transportation Needs:   . Film/video editor (Medical):   Marland Kitchen Lack of Transportation (Non-Medical):   Physical Activity:   . Days of Exercise per Week:   . Minutes of Exercise per Session:   Stress:   . Feeling of Stress :   Social Connections:   . Frequency of Communication with Friends and Family:   . Frequency of Social Gatherings with Friends and Family:   . Attends Religious Services:   . Active Member of Clubs or Organizations:   . Attends Archivist Meetings:   Marland Kitchen Marital Status:     Allergies:  Allergies  Allergen  Reactions  . Codeine Nausea Only    Metabolic Disorder Labs: No results found for: HGBA1C, MPG No results found for: PROLACTIN No results found for: CHOL, TRIG, HDL, CHOLHDL, VLDL, LDLCALC No results found for: TSH  Therapeutic Level Labs: No results found for: LITHIUM No results found for: VALPROATE No components found for:  CBMZ  Current Medications: Current Outpatient Medications  Medication Sig Dispense Refill  . HYDROcodone-acetaminophen (NORCO/VICODIN) 5-325 MG tablet 1/2 to 1 tab po bid prn    . amitriptyline (ELAVIL) 25 MG tablet Take 1 tablet (25 mg total) by mouth at bedtime. 90 tablet 0  . azelastine (ASTELIN) 0.1 % nasal spray Place 1 spray into both nostrils 2 (two) times daily.    Marland Kitchen buPROPion (WELLBUTRIN XL) 300 MG 24 hr tablet Take 1 tablet (300  mg total) by mouth daily. 90 tablet 1  . busPIRone (BUSPAR) 10 MG tablet Take 1 tablet (10 mg total) by mouth 2 (two) times daily. 180 tablet 0  . Cholecalciferol (VITAMIN D3) 1000 units CAPS Take 1 capsule by mouth daily.    . cloNIDine (CATAPRES) 0.1 MG tablet Take 0.1 mg by mouth at bedtime.  4  . DEXILANT 60 MG capsule Take 60 mg by mouth daily.  2  . dicyclomine (BENTYL) 10 MG capsule Take by mouth.    . fluticasone (FLONASE) 50 MCG/ACT nasal spray fluticasone 50 mcg/actuation nasal spray,suspension    . gabapentin (NEURONTIN) 300 MG capsule TAKE 300 MG TWICE A DAY FOR ONE WEEK THEN INCREASE TO 300 MG THREE TIMES A DAY    . HYDROcodone-acetaminophen (NORCO/VICODIN) 5-325 MG tablet Take 0.5-1 tablets by mouth 2 (two) times daily.    . hydrOXYzine (ATARAX/VISTARIL) 10 MG tablet TAKE 1-2 TABLETS (10-20 MG TOTAL) BY MOUTH 2 (TWO) TIMES DAILY AS NEEDED (SEVERE ANXIETY SX). 360 tablet 3  . ibuprofen (ADVIL,MOTRIN) 600 MG tablet     . levothyroxine (SYNTHROID) 88 MCG tablet Take 88 mcg by mouth daily.    Marland Kitchen lisinopril-hydrochlorothiazide (PRINZIDE,ZESTORETIC) 10-12.5 MG tablet Take 1 tablet by mouth daily.   2  .  methylPREDNISolone (MEDROL DOSEPAK) 4 MG TBPK tablet TAKE 6 TABLETS ON DAY 1 AS DIRECTED ON PACKAGE AND DECREASE BY 1 TAB EACH DAY FOR A TOTAL OF 6 DAYS    . nitrofurantoin (MACRODANTIN) 100 MG capsule Take 100 mg by mouth 2 (two) times daily.  0  . nitrofurantoin (MACRODANTIN) 50 MG capsule Take by mouth daily.    . phenazopyridine (PYRIDIUM) 200 MG tablet     . predniSONE (DELTASONE) 10 MG tablet Take 6 tabs (60mg ) by mouth followed by 5 tabs (50mg ), then 4 tabs (40mg ), then 3 tabs (30mg ), then 2 tabs (20mg ), then 1 tab (10mg )    . rosuvastatin (CRESTOR) 20 MG tablet Take 20 mg by mouth daily.    . sucralfate (CARAFATE) 1 g tablet TAKE 1 TABLET (1 G TOTAL) BY MOUTH 4 (FOUR) TIMES DAILY BEFORE MEALS AND NIGHTLY    . VENTOLIN HFA 108 (90 Base) MCG/ACT inhaler     . Vitamin D, Ergocalciferol, (DRISDOL) 50000 units CAPS capsule TAKE 1 CAP BY MOUTH ONCE A WEEK  3   No current facility-administered medications for this visit.     Musculoskeletal: Strength & Muscle Tone: UTA Gait & Station: normal Patient leans: N/A  Psychiatric Specialty Exam: Review of Systems  Psychiatric/Behavioral: Negative for agitation, behavioral problems, confusion, decreased concentration, dysphoric mood, hallucinations, self-injury, sleep disturbance and suicidal ideas. The patient is not nervous/anxious and is not hyperactive.   All other systems reviewed and are negative.   There were no vitals taken for this visit.There is no height or weight on file to calculate BMI.  General Appearance: Casual  Eye Contact:  Fair  Speech:  Clear and Coherent  Volume:  Normal  Mood:  Euthymic  Affect:  Congruent  Thought Process:  Goal Directed and Descriptions of Associations: Intact  Orientation:  Full (Time, Place, and Person)  Thought Content: Logical   Suicidal Thoughts:  No  Homicidal Thoughts:  No  Memory:  Immediate;   Fair Recent;   Fair Remote;   Fair  Judgement:  Fair  Insight:  Fair  Psychomotor  Activity:  Normal  Concentration:  Concentration: Fair and Attention Span: Fair  Recall:  [employer] of Knowledge: Fair  Language:  Fair  Akathisia:  No  Handed:  Right  AIMS (if indicated): UTA  Assets:  Communication Skills Desire for Improvement Housing Talents/Skills Transportation Vocational/Educational  ADL's:  Intact  Cognition: WNL  Sleep:  Fair   Screenings:   Assessment and Plan: Seren is a 64 year old Caucasian female, widowed, has a history of MDD, fibromyalgia, memory changes was evaluated by telemedicine today.  Patient with psychosocial stressors of job related problems, pandemic. Patient however is making progress on the current medication regimen as well as psychotherapy sessions.  Patient will benefit from the following plan.  Plan Depression in remission Wellbutrin XL release at reduced dose of 300 mg p.o. daily  GAD-improving Elavil 25 mg p.o. daily.  She did not tolerate the higher dosage. BuSpar 10 mg p.o. twice daily. Hydroxyzine 20 mg p.o. daily as needed for anxiety attacks only.  Panic attacks-stable Continue CBT Hydroxyzine as prescribed.  Insomnia-stable Elavil as prescribed.  She is currently on 25 mg p.o. nightly.  Cognitive changes-patient had psychological testing done-pending report. Patient will continue to follow-up with her psychologist and neurologist. Discussed with patient to sign a release to obtain medical records from her psychological testing.     GeneSight testing results were reviewed while making medication changes.  Follow-up in clinic in 6 weeks or sooner if needed.  I have spent atleast 20 minutes non face to face with patient today. More than 50 % of the time was spent for preparing to see the patient ( e.g., review of test, records ),  ordering medications and test ,psychoeducation and supportive psychotherapy and care coordination,as well as documenting clinical information in electronic health record. This note was  generated in part or whole with voice recognition software. Voice recognition is usually quite accurate but there are transcription errors that can and very often do occur. I apologize for any typographical errors that were not detected and corrected.         Ursula Alert, MD 03/02/2020, 4:24 PM

## 2020-04-19 ENCOUNTER — Other Ambulatory Visit: Payer: Self-pay

## 2020-04-19 ENCOUNTER — Telehealth (INDEPENDENT_AMBULATORY_CARE_PROVIDER_SITE_OTHER): Payer: BC Managed Care – PPO | Admitting: Psychiatry

## 2020-04-19 ENCOUNTER — Encounter: Payer: Self-pay | Admitting: Psychiatry

## 2020-04-19 DIAGNOSIS — F410 Panic disorder [episodic paroxysmal anxiety] without agoraphobia: Secondary | ICD-10-CM

## 2020-04-19 DIAGNOSIS — F5105 Insomnia due to other mental disorder: Secondary | ICD-10-CM | POA: Diagnosis not present

## 2020-04-19 DIAGNOSIS — F3342 Major depressive disorder, recurrent, in full remission: Secondary | ICD-10-CM

## 2020-04-19 DIAGNOSIS — F411 Generalized anxiety disorder: Secondary | ICD-10-CM

## 2020-04-19 DIAGNOSIS — F09 Unspecified mental disorder due to known physiological condition: Secondary | ICD-10-CM

## 2020-04-19 NOTE — Progress Notes (Signed)
Provider Location : [facility] Patient Location : Home  Virtual Visit via Video Note  I connected with Evelyn Bentley on 04/19/20 at  4:00 PM EDT by a video enabled telemedicine application and verified that I am speaking with the correct person using two identifiers.   I discussed the limitations of evaluation and management by telemedicine and the availability of in person appointments. The patient expressed understanding and agreed to proceed.    I discussed the assessment and treatment plan with the patient. The patient was provided an opportunity to ask questions and all were answered. The patient agreed with the plan and demonstrated an understanding of the instructions.   The patient was advised to call back or seek an in-person evaluation if the symptoms worsen or if the condition fails to improve as anticipated.   [place] MD OP Progress Note  04/19/2020 4:13 PM Evelyn Bentley  MRN:  [id number]  Chief Complaint:  Chief Complaint    Follow-up     HPI: Evelyn Bentley is a 64 year old female who is widowed, has a history of MDD, panic attacks, GAD, cognitive disorder lives in [place] was evaluated by telemedicine today.  Patient today reports she is currently making progress on the current medication regimen.  Her anxiety and depressive symptoms are under control.  She is compliant on medications as prescribed.  Denies side effects.  She reports sleep is better.  She does report some fatigue during the day and is currently following up with her primary care provider and having  work-up done.  She also had recent sleep study completed and is awaiting report for the same.  She continues to follow-up with her therapist and reports therapy sessions as beneficial.  She reports work is currently busy and she so far has been able to manage work even though it is a stressor for her.  Patient denies any suicidality, homicidality or perceptual disturbances.  Patient denies any other  concerns today.    Visit Diagnosis:    ICD-10-CM   1. MDD (major depressive disorder), recurrent, in full remission ([place])  F33.42   2. Panic attack  F41.0   3. GAD (generalized anxiety disorder)  F41.1   4. Insomnia due to mental condition  F51.05   5. Cognitive disorder  F09     Past Psychiatric History: I have reviewed past psychiatric history from my progress note on 02/26/2018  Past Medical History:  Past Medical History:  Diagnosis Date  . Anxiety   . Arthritis   . Bursitis    Rt hip  . Complication of anesthesia    nausea  . Depression   . Fibromyalgia   . GERD (gastroesophageal reflux disease)   . Hypercholesteremia   . Hypertension   . Hypothyroidism   . Thyroid disease     Past Surgical History:  Procedure Laterality Date  . ABDOMINAL HYSTERECTOMY    . CHOLECYSTECTOMY    . COLONOSCOPY WITH PROPOFOL N/A 11/13/2016   Procedure: COLONOSCOPY WITH PROPOFOL;  Surgeon: Lollie Sails, MD;  Location: [facility] ENDOSCOPY;  Service: Endoscopy;  Laterality: N/A;  . ESOPHAGOGASTRODUODENOSCOPY (EGD) WITH PROPOFOL N/A 11/13/2016   Procedure: ESOPHAGOGASTRODUODENOSCOPY (EGD) WITH PROPOFOL;  Surgeon: Lollie Sails, MD;  Location: [facility] ENDOSCOPY;  Service: Endoscopy;  Laterality: N/A;  . ESOPHAGOGASTRODUODENOSCOPY (EGD) WITH PROPOFOL N/A 12/16/2018   Procedure: ESOPHAGOGASTRODUODENOSCOPY (EGD) WITH PROPOFOL;  Surgeon: Lollie Sails, MD;  Location: [facility] ENDOSCOPY;  Service: Endoscopy;  Laterality: N/A;  . LAPAROSCOPIC HYSTERECTOMY Bilateral 02/28/2016  Procedure: HYSTERECTOMY TOTAL LAPAROSCOPIC / BSO;  Surgeon: Honor Loh Ward, MD;  Location: [facility] ORS;  Service: Gynecology;  Laterality: Bilateral;  . NECK SURGERY    . NISSEN FUNDOPLICATION    . TUBAL LIGATION    . URETEROSCOPY WITH HOLMIUM LASER LITHOTRIPSY Left 07/03/2016   Procedure: URETEROSCOPY WITH HOLMIUM LASER LITHOTRIPSY;  Surgeon: Royston Cowper, MD;  Location: [facility] ORS;  Service: Urology;  Laterality: Left;     Family Psychiatric History: Reviewed family psychiatric history from my progress note on 02/26/2018  Family History:  Family History  Problem Relation Age of Onset  . Depression Mother   . Anxiety disorder Mother   . Breast cancer Neg Hx     Social History: Reviewed social history from my progress note on 02/26/2018 Social History   Socioeconomic History  . Marital status: Widowed    Spouse name: Not on file  . Number of children: 2  . Years of education: Not on file  . Highest education level: High school graduate  Occupational History    Comment: fulltime  Tobacco Use  . Smoking status: Former Smoker    Start date: 11/14/1974    Quit date: 05/14/2014    Years since quitting: 5.9  . Smokeless tobacco: Never Used  Substance and Sexual Activity  . Alcohol use: Not Currently    Alcohol/week: 0.0 - 1.0 standard drinks    Comment: social rare  . Drug use: No  . Sexual activity: Yes    Partners: Male    Birth control/protection: Condom  Other Topics Concern  . Not on file  Social History Narrative  . Not on file   Social Determinants of Health   Financial Resource Strain:   . Difficulty of Paying Living Expenses:   Food Insecurity:   . Worried About Charity fundraiser in the Last Year:   . Arboriculturist in the Last Year:   Transportation Needs:   . Film/video editor (Medical):   Marland Kitchen Lack of Transportation (Non-Medical):   Physical Activity:   . Days of Exercise per Week:   . Minutes of Exercise per Session:   Stress:   . Feeling of Stress :   Social Connections:   . Frequency of Communication with Friends and Family:   . Frequency of Social Gatherings with Friends and Family:   . Attends Religious Services:   . Active Member of Clubs or Organizations:   . Attends Archivist Meetings:   Marland Kitchen Marital Status:     Allergies:  Allergies  Allergen Reactions  . Codeine Nausea Only    Metabolic Disorder Labs: No results found for: HGBA1C, MPG No  results found for: PROLACTIN No results found for: CHOL, TRIG, HDL, CHOLHDL, VLDL, LDLCALC No results found for: TSH  Therapeutic Level Labs: No results found for: LITHIUM No results found for: VALPROATE No components found for:  CBMZ  Current Medications: Current Outpatient Medications  Medication Sig Dispense Refill  . amitriptyline (ELAVIL) 25 MG tablet Take 1 tablet (25 mg total) by mouth at bedtime. 90 tablet 0  . azelastine (ASTELIN) 0.1 % nasal spray Place 1 spray into both nostrils 2 (two) times daily.    Marland Kitchen buPROPion (WELLBUTRIN XL) 300 MG 24 hr tablet Take 1 tablet (300 mg total) by mouth daily. 90 tablet 1  . busPIRone (BUSPAR) 10 MG tablet Take 1 tablet (10 mg total) by mouth 2 (two) times daily. 180 tablet 0  . Cholecalciferol (VITAMIN D3) 1000 units  CAPS Take 1 capsule by mouth daily.    Marland Kitchen DEXILANT 60 MG capsule Take 60 mg by mouth daily.  2  . dicyclomine (BENTYL) 10 MG capsule Take by mouth.    . fluticasone (FLONASE) 50 MCG/ACT nasal spray fluticasone 50 mcg/actuation nasal spray,suspension    . gabapentin (NEURONTIN) 300 MG capsule TAKE 300 MG TWICE A DAY FOR ONE WEEK THEN INCREASE TO 300 MG THREE TIMES A DAY    . HYDROcodone-acetaminophen (NORCO/VICODIN) 5-325 MG tablet 1/2 to 1 tab po bid prn    . HYDROcodone-acetaminophen (NORCO/VICODIN) 5-325 MG tablet Take 0.5-1 tablets by mouth 2 (two) times daily.    . hydrOXYzine (ATARAX/VISTARIL) 10 MG tablet TAKE 1-2 TABLETS (10-20 MG TOTAL) BY MOUTH 2 (TWO) TIMES DAILY AS NEEDED (SEVERE ANXIETY SX). 360 tablet 3  . ibuprofen (ADVIL,MOTRIN) 600 MG tablet     . levothyroxine (SYNTHROID) 88 MCG tablet Take 88 mcg by mouth daily.    Marland Kitchen lisinopril-hydrochlorothiazide (PRINZIDE,ZESTORETIC) 10-12.5 MG tablet Take 1 tablet by mouth daily.   2  . methylPREDNISolone (MEDROL DOSEPAK) 4 MG TBPK tablet TAKE 6 TABLETS ON DAY 1 AS DIRECTED ON PACKAGE AND DECREASE BY 1 TAB EACH DAY FOR A TOTAL OF 6 DAYS    . nitrofurantoin (MACRODANTIN) 100 MG  capsule Take 100 mg by mouth 2 (two) times daily.  0  . nitrofurantoin (MACRODANTIN) 50 MG capsule Take by mouth daily.    . phenazopyridine (PYRIDIUM) 200 MG tablet     . predniSONE (DELTASONE) 10 MG tablet Take 6 tabs (60mg ) by mouth followed by 5 tabs (50mg ), then 4 tabs (40mg ), then 3 tabs (30mg ), then 2 tabs (20mg ), then 1 tab (10mg )    . rosuvastatin (CRESTOR) 20 MG tablet Take 20 mg by mouth daily.    . sucralfate (CARAFATE) 1 g tablet TAKE 1 TABLET (1 G TOTAL) BY MOUTH 4 (FOUR) TIMES DAILY BEFORE MEALS AND NIGHTLY    . VENTOLIN HFA 108 (90 Base) MCG/ACT inhaler     . Vitamin D, Ergocalciferol, (DRISDOL) 50000 units CAPS capsule TAKE 1 CAP BY MOUTH ONCE A WEEK  3   No current facility-administered medications for this visit.     Musculoskeletal: Strength & Muscle Tone: UTA Gait & Station: normal Patient leans: N/A  Psychiatric Specialty Exam: Review of Systems  Constitutional: Positive for fatigue.  Psychiatric/Behavioral: Negative for agitation, behavioral problems, confusion, decreased concentration, dysphoric mood, hallucinations, self-injury, sleep disturbance and suicidal ideas. The patient is not nervous/anxious and is not hyperactive.   All other systems reviewed and are negative.   There were no vitals taken for this visit.There is no height or weight on file to calculate BMI.  General Appearance: Casual  Eye Contact:  Fair  Speech:  Clear and Coherent  Volume:  Normal  Mood:  Euthymic  Affect:  Congruent  Thought Process:  Goal Directed and Descriptions of Associations: Intact  Orientation:  Full (Time, Place, and Person)  Thought Content: Logical   Suicidal Thoughts:  No  Homicidal Thoughts:  No  Memory:  Immediate;   Fair Recent;   Fair Remote;   Fair  Judgement:  Fair  Insight:  Good  Psychomotor Activity:  Normal  Concentration:  Concentration: Fair and Attention Span: Fair  Recall:  [employer] of Knowledge: Fair  Language: Fair  Akathisia:  No   Handed:  Right  AIMS (if indicated): UTA  Assets:  Communication Skills Desire for Improvement Housing Social Support  ADL's:  Intact  Cognition: WNL  Sleep:  Fair   Screenings:   Assessment and Plan: Evelyn Bentley is a 64 year old Caucasian female, widowed, has a history of MDD, fibromyalgia, memory changes was evaluated by telemedicine today.  Patient with psychosocial stressors of job related problems, pandemic.  Patient is currently making progress except for fatigue during the day.  Plan as noted below.  Plan Depression in remission Wellbutrin XL 300 mg p.o. daily-reduce dosage.  GAD-improving Elavil 25 mg p.o. daily.  She did not tolerate higher dosage. BuSpar 10 mg p.o. twice daily Hydroxyzine 20 mg p.o. daily as needed for anxiety attacks  Panic attacks-stable Continue CBT Hydroxyzine as prescribed  Insomnia-stable Elavil as prescribed.  Cognitive changes-patient had psychological testing done-pending report.  She will continue to follow-up with psychologist / neurologist.  Patient was advised to sign a release to obtain medical records from her psychological testing-pending.  GeneSight testing results were reviewed while making medication changes today.  Sleep study completed recently-pending report.  Discussed with patient to sign a release so her sleep study report can be released to [place].    Follow-up in clinic in 6 to 8 weeks or sooner if needed.  I have spent atleast 20 minutes non face to face with patient today. More than 50 % of the time was spent for preparing to see the patient ( e.g., review of test, records ), obtaining and to review and separately obtained history , ordering medications and test ,psychoeducation and supportive psychotherapy and care coordination,as well as documenting clinical information in electronic health record. This note was generated in part or whole with voice recognition software. Voice recognition is usually quite accurate  but there are transcription errors that can and very often do occur. I apologize for any typographical errors that were not detected and corrected.       Ursula Alert, MD 04/19/2020, 4:13 PM

## 2020-05-10 ENCOUNTER — Other Ambulatory Visit: Payer: Self-pay | Admitting: Internal Medicine

## 2020-05-10 DIAGNOSIS — Z1231 Encounter for screening mammogram for malignant neoplasm of breast: Secondary | ICD-10-CM

## 2020-06-06 ENCOUNTER — Other Ambulatory Visit: Payer: Self-pay | Admitting: Psychiatry

## 2020-06-06 DIAGNOSIS — F411 Generalized anxiety disorder: Secondary | ICD-10-CM

## 2020-06-06 DIAGNOSIS — F5105 Insomnia due to other mental disorder: Secondary | ICD-10-CM

## 2020-06-06 DIAGNOSIS — F3342 Major depressive disorder, recurrent, in full remission: Secondary | ICD-10-CM

## 2020-06-06 DIAGNOSIS — F410 Panic disorder [episodic paroxysmal anxiety] without agoraphobia: Secondary | ICD-10-CM

## 2020-06-16 ENCOUNTER — Other Ambulatory Visit: Payer: Self-pay

## 2020-06-16 ENCOUNTER — Ambulatory Visit
Admission: RE | Admit: 2020-06-16 | Discharge: 2020-06-16 | Disposition: A | Discharge status: Home / Self Care | Payer: BC Managed Care – PPO | Source: Ambulatory Visit | Attending: Internal Medicine | Admitting: Internal Medicine

## 2020-06-16 DIAGNOSIS — Z1231 Encounter for screening mammogram for malignant neoplasm of breast: Secondary | ICD-10-CM | POA: Insufficient documentation

## 2020-06-27 ENCOUNTER — Other Ambulatory Visit: Payer: Self-pay | Admitting: Psychiatry

## 2020-06-27 DIAGNOSIS — F410 Panic disorder [episodic paroxysmal anxiety] without agoraphobia: Secondary | ICD-10-CM

## 2020-06-27 DIAGNOSIS — F411 Generalized anxiety disorder: Secondary | ICD-10-CM

## 2020-07-04 ENCOUNTER — Encounter: Payer: Self-pay | Admitting: Psychiatry

## 2020-07-04 ENCOUNTER — Other Ambulatory Visit: Payer: Self-pay

## 2020-07-04 ENCOUNTER — Telehealth (INDEPENDENT_AMBULATORY_CARE_PROVIDER_SITE_OTHER): Payer: BC Managed Care – PPO | Admitting: Psychiatry

## 2020-07-04 DIAGNOSIS — F3342 Major depressive disorder, recurrent, in full remission: Secondary | ICD-10-CM | POA: Diagnosis not present

## 2020-07-04 DIAGNOSIS — F411 Generalized anxiety disorder: Secondary | ICD-10-CM | POA: Diagnosis not present

## 2020-07-04 DIAGNOSIS — F410 Panic disorder [episodic paroxysmal anxiety] without agoraphobia: Secondary | ICD-10-CM | POA: Diagnosis not present

## 2020-07-04 DIAGNOSIS — F5105 Insomnia due to other mental disorder: Secondary | ICD-10-CM

## 2020-07-04 DIAGNOSIS — F09 Unspecified mental disorder due to known physiological condition: Secondary | ICD-10-CM

## 2020-07-04 MED ORDER — AMITRIPTYLINE HCL 25 MG PO TABS: 25.0000 mg | ORAL_TABLET | Freq: Every day | ORAL | 0 refills | Status: DC

## 2020-07-04 MED ORDER — BUPROPION HCL ER (XL) 300 MG PO TB24: 300.0000 mg | ORAL_TABLET | Freq: Every day | ORAL | 1 refills | Status: DC

## 2020-07-04 NOTE — Progress Notes (Signed)
Provider Location : [facility] Patient Location : Work  Participants: Patient , Provider  Virtual Visit via Video Note  I connected with Evelyn Bentley on 07/04/20 at  4:00 PM EDT by a video enabled telemedicine application and verified that I am speaking with the correct person using two identifiers.   I discussed the limitations of evaluation and management by telemedicine and the availability of in person appointments. The patient expressed understanding and agreed to proceed.    I discussed the assessment and treatment plan with the patient. The patient was provided an opportunity to ask questions and all were answered. The patient agreed with the plan and demonstrated an understanding of the instructions.   The patient was advised to call back or seek an in-person evaluation if the symptoms worsen or if the condition fails to improve as anticipated.   [place] MD OP Progress Note  07/04/2020 5:36 PM Evelyn Bentley  MRN:  [id number]  Chief Complaint:  Chief Complaint    Follow-up     HPI: Evelyn Bentley is a 64 year old female, widowed, has a history of MDD, panic attacks, GAD, cognitive disorder, lives in [place] was evaluated by telemedicine today.  Patient today reports mood wise she is doing okay.  She however reports that she is currently at this new position at work, and that does overwhelm her on and off.  She however reports she is coping with it okay.  She reports she continues to struggle with fatigue on and off.  She also reports recent muscle spasms which gets worse at night.  She agrees to contact her neurologist to be evaluated.  She did have nerve conduction test done in the past which she reports was abnormal.  Patient denies any suicidality, homicidality or perceptual disturbances.  Patient is compliant on medications.  Patient denies any other concerns today.  Visit Diagnosis:    ICD-10-CM   1. MDD (major depressive disorder), recurrent, in full remission  ([place])  F33.42 buPROPion (WELLBUTRIN XL) 300 MG 24 hr tablet  2. Panic attack  F41.0 amitriptyline (ELAVIL) 25 MG tablet  3. GAD (generalized anxiety disorder)  F41.1 amitriptyline (ELAVIL) 25 MG tablet  4. Insomnia due to mental condition  F51.05   5. Cognitive disorder  F09     Past Psychiatric History: I have reviewed past psychiatric history from my progress note on 02/26/2018  Past Medical History:  Past Medical History:  Diagnosis Date   Anxiety    Arthritis    Bursitis    Rt hip   Complication of anesthesia    nausea   Depression    Fibromyalgia    GERD (gastroesophageal reflux disease)    Hypercholesteremia    Hypertension    Hypothyroidism    Thyroid disease     Past Surgical History:  Procedure Laterality Date   ABDOMINAL HYSTERECTOMY     CHOLECYSTECTOMY     COLONOSCOPY WITH PROPOFOL N/A 11/13/2016   Procedure: COLONOSCOPY WITH PROPOFOL;  Surgeon: Lollie Sails, MD;  Location: [facility] ENDOSCOPY;  Service: Endoscopy;  Laterality: N/A;   ESOPHAGOGASTRODUODENOSCOPY (EGD) WITH PROPOFOL N/A 11/13/2016   Procedure: ESOPHAGOGASTRODUODENOSCOPY (EGD) WITH PROPOFOL;  Surgeon: Lollie Sails, MD;  Location: [facility] ENDOSCOPY;  Service: Endoscopy;  Laterality: N/A;   ESOPHAGOGASTRODUODENOSCOPY (EGD) WITH PROPOFOL N/A 12/16/2018   Procedure: ESOPHAGOGASTRODUODENOSCOPY (EGD) WITH PROPOFOL;  Surgeon: Lollie Sails, MD;  Location: [facility] ENDOSCOPY;  Service: Endoscopy;  Laterality: N/A;   LAPAROSCOPIC HYSTERECTOMY Bilateral 02/28/2016   Procedure: HYSTERECTOMY TOTAL LAPAROSCOPIC /  BSO;  Surgeon: Honor Loh Ward, MD;  Location: [facility] ORS;  Service: Gynecology;  Laterality: Bilateral;   NECK SURGERY     NISSEN FUNDOPLICATION     TUBAL LIGATION     URETEROSCOPY WITH HOLMIUM LASER LITHOTRIPSY Left 07/03/2016   Procedure: URETEROSCOPY WITH HOLMIUM LASER LITHOTRIPSY;  Surgeon: Royston Cowper, MD;  Location: [facility] ORS;  Service: Urology;  Laterality: Left;    Family  Psychiatric History: I have reviewed family psychiatric history from my progress note on 02/26/2018  Family History:  Family History  Problem Relation Age of Onset   Depression Mother    Anxiety disorder Mother    Breast cancer Neg Hx     Social History: I have reviewed social history from my progress note on 02/26/2018 Social History   Socioeconomic History   Marital status: Widowed    Spouse name: Not on file   Number of children: 2   Years of education: Not on file   Highest education level: High school graduate  Occupational History    Comment: fulltime  Tobacco Use   Smoking status: Former Smoker    Start date: 11/14/1974    Quit date: 05/14/2014    Years since quitting: 6.1   Smokeless tobacco: Never Used  Vaping Use   Vaping Use: Every day  Substance and Sexual Activity   Alcohol use: Not Currently    Alcohol/week: 0.0 - 1.0 standard drinks    Comment: social rare   Drug use: No   Sexual activity: Yes    Partners: Male    Birth control/protection: Condom  Other Topics Concern   Not on file  Social History Narrative   Not on file   Social Determinants of Health   Financial Resource Strain:    Difficulty of Paying Living Expenses: Not on file  Food Insecurity:    Worried About Charity fundraiser in the Last Year: Not on file   [employer] of Food in the Last Year: Not on file  Transportation Needs:    Lack of Transportation (Medical): Not on file   Lack of Transportation (Non-Medical): Not on file  Physical Activity:    Days of Exercise per Week: Not on file   Minutes of Exercise per Session: Not on file  Stress:    Feeling of Stress : Not on file  Social Connections:    Frequency of Communication with Friends and Family: Not on file   Frequency of Social Gatherings with Friends and Family: Not on file   Attends Religious Services: Not on file   Active Member of Clubs or Organizations: Not on file   Attends Archivist  Meetings: Not on file   Marital Status: Not on file    Allergies:  Allergies  Allergen Reactions   Codeine Nausea Only    Metabolic Disorder Labs: No results found for: HGBA1C, MPG No results found for: PROLACTIN No results found for: CHOL, TRIG, HDL, CHOLHDL, VLDL, LDLCALC No results found for: TSH  Therapeutic Level Labs: No results found for: LITHIUM No results found for: VALPROATE No components found for:  CBMZ  Current Medications: Current Outpatient Medications  Medication Sig Dispense Refill   amitriptyline (ELAVIL) 25 MG tablet Take 1 tablet (25 mg total) by mouth at bedtime. 90 tablet 0   azelastine (ASTELIN) 0.1 % nasal spray Place 1 spray into both nostrils 2 (two) times daily.     buPROPion (WELLBUTRIN XL) 300 MG 24 hr tablet Take 1 tablet (300 mg  total) by mouth daily. 90 tablet 1   busPIRone (BUSPAR) 10 MG tablet TAKE 1 TABLET BY MOUTH TWICE A DAY 180 tablet 0   Calcium Carbonate-Vitamin D 600-400 MG-UNIT tablet Take 1 tablet by mouth 2 (two) times daily.     Cholecalciferol (VITAMIN D3) 1000 units CAPS Take 1 capsule by mouth daily.     DEXILANT 60 MG capsule Take 60 mg by mouth daily.  2   dicyclomine (BENTYL) 10 MG capsule Take by mouth.     fluticasone (FLONASE) 50 MCG/ACT nasal spray fluticasone 50 mcg/actuation nasal spray,suspension     gabapentin (NEURONTIN) 300 MG capsule TAKE 300 MG TWICE A DAY FOR ONE WEEK THEN INCREASE TO 300 MG THREE TIMES A DAY     HYDROcodone-acetaminophen (NORCO/VICODIN) 5-325 MG tablet 1/2 to 1 tab po bid prn     HYDROcodone-acetaminophen (NORCO/VICODIN) 5-325 MG tablet Take 0.5-1 tablets by mouth 2 (two) times daily.     hydrOXYzine (ATARAX/VISTARIL) 10 MG tablet TAKE 1-2 TABLETS (10-20 MG TOTAL) BY MOUTH 2 (TWO) TIMES DAILY AS NEEDED (SEVERE ANXIETY SX). 360 tablet 3   ibuprofen (ADVIL,MOTRIN) 600 MG tablet      levothyroxine (SYNTHROID) 88 MCG tablet Take 88 mcg by mouth daily.      lisinopril-hydrochlorothiazide (PRINZIDE,ZESTORETIC) 10-12.5 MG tablet Take 1 tablet by mouth daily.   2   methylPREDNISolone (MEDROL DOSEPAK) 4 MG TBPK tablet TAKE 6 TABLETS ON DAY 1 AS DIRECTED ON PACKAGE AND DECREASE BY 1 TAB EACH DAY FOR A TOTAL OF 6 DAYS     nitrofurantoin (MACRODANTIN) 100 MG capsule Take 100 mg by mouth 2 (two) times daily.  0   nitrofurantoin (MACRODANTIN) 50 MG capsule Take by mouth daily.     phenazopyridine (PYRIDIUM) 200 MG tablet      predniSONE (DELTASONE) 10 MG tablet Take 6 tabs (60mg ) by mouth followed by 5 tabs (50mg ), then 4 tabs (40mg ), then 3 tabs (30mg ), then 2 tabs (20mg ), then 1 tab (10mg )     rosuvastatin (CRESTOR) 20 MG tablet Take 20 mg by mouth daily.     sucralfate (CARAFATE) 1 g tablet TAKE 1 TABLET (1 G TOTAL) BY MOUTH 4 (FOUR) TIMES DAILY BEFORE MEALS AND NIGHTLY     VENTOLIN HFA 108 (90 Base) MCG/ACT inhaler      Vitamin D, Ergocalciferol, (DRISDOL) 50000 units CAPS capsule TAKE 1 CAP BY MOUTH ONCE A WEEK  3   No current facility-administered medications for this visit.     Musculoskeletal: Strength & Muscle Tone: UTA Gait & Station: normal Patient leans: N/A  Psychiatric Specialty Exam: Review of Systems  Musculoskeletal:       Muscle spasms  Psychiatric/Behavioral: The patient is nervous/anxious.   All other systems reviewed and are negative.   There were no vitals taken for this visit.There is no height or weight on file to calculate BMI.  General Appearance: Casual  Eye Contact:  Fair  Speech:  Normal Rate  Volume:  Normal  Mood:  Anxious  Affect:  Congruent  Thought Process:  Goal Directed and Descriptions of Associations: Intact  Orientation:  Full (Time, Place, and Person)  Thought Content: Logical   Suicidal Thoughts:  No  Homicidal Thoughts:  No  Memory:  Immediate;   Fair Recent;   Fair Remote;   Fair  Judgement:  Fair  Insight:  Fair  Psychomotor Activity:  Normal  Concentration:  Concentration: Fair  and Attention Span: Fair  Recall:  [employer] of Knowledge: Fair  Language: Fair  Akathisia:  No  Handed:  Right  AIMS (if indicated): UTA  Assets:  Communication Skills Desire for Improvement Housing Social Support Talents/Skills Transportation Vocational/Educational  ADL's:  Intact  Cognition: WNL  Sleep:  Fair   Screenings:   Assessment and Plan: Evelyn Bentley is a 64 year old Caucasian female, widowed, has a history of MDD, fibromyalgia, memory changes was evaluated by telemedicine today.  Patient with psychosocial stressors of job related problems, pandemic.  Patient currently reports mood symptoms as stable however does report muscle spasms since the past few weeks.  Discussed plan as noted below.  Plan Depression in remission Wellbutrin XL 300 mg p.o. daily-reduced dosage  GAD-improving Elavil 25 mg p.o. daily.  She did not tolerate the higher dosage BuSpar 10 mg p.o. twice daily Hydroxyzine 20 mg p.o. daily as needed for anxiety attacks  Panic attacks-stable Continue CBT Continue hydroxyzine and BuSpar as prescribed  Insomnia-stable Elavil as prescribed   Cognitive changes-patient had psychological testing done-pending report.  She will continue to follow-up with psychologist/neurologist.  [facility] testing results were reviewed while making medication changes.  Sleep study completed-pending report.  Patient with muscle spasms, advised patient to follow-up with neurologist.  She does have a history of fibromyalgia as well as is on multiple medications.  She reports nerve conduction test was abnormal previously.  Also discussed with patient that muscle spasms could be due to anxiety as well as medication induced.  Follow-up in clinic in 6 to 8 weeks or sooner if needed.  I have spent atleast 20 minutes face to face with patient today. More than 50 % of the time was spent for preparing to see the patient ( e.g., review of test, records ), ordering  medications and test ,psychoeducation and supportive psychotherapy and care coordination,as well as documenting clinical information in electronic health record. This note was generated in part or whole with voice recognition software. Voice recognition is usually quite accurate but there are transcription errors that can and very often do occur. I apologize for any typographical errors that were not detected and corrected.       Ursula Alert, MD 07/04/2020, 5:36 PM

## 2020-07-16 ENCOUNTER — Other Ambulatory Visit: Payer: Self-pay | Admitting: Psychiatry

## 2020-07-16 DIAGNOSIS — F410 Panic disorder [episodic paroxysmal anxiety] without agoraphobia: Secondary | ICD-10-CM

## 2020-07-16 DIAGNOSIS — F331 Major depressive disorder, recurrent, moderate: Secondary | ICD-10-CM

## 2020-08-02 ENCOUNTER — Other Ambulatory Visit: Payer: Self-pay | Admitting: Psychiatry

## 2020-08-02 DIAGNOSIS — F331 Major depressive disorder, recurrent, moderate: Secondary | ICD-10-CM

## 2020-08-02 DIAGNOSIS — F410 Panic disorder [episodic paroxysmal anxiety] without agoraphobia: Secondary | ICD-10-CM

## 2020-08-05 ENCOUNTER — Other Ambulatory Visit: Payer: Self-pay | Admitting: Orthopedic Surgery

## 2020-08-15 ENCOUNTER — Other Ambulatory Visit: Payer: Self-pay | Admitting: Psychiatry

## 2020-08-15 DIAGNOSIS — F331 Major depressive disorder, recurrent, moderate: Secondary | ICD-10-CM

## 2020-08-15 DIAGNOSIS — F410 Panic disorder [episodic paroxysmal anxiety] without agoraphobia: Secondary | ICD-10-CM

## 2020-08-16 ENCOUNTER — Other Ambulatory Visit: Payer: Self-pay

## 2020-08-16 ENCOUNTER — Encounter: Payer: Self-pay | Admitting: Psychiatry

## 2020-08-16 ENCOUNTER — Telehealth (INDEPENDENT_AMBULATORY_CARE_PROVIDER_SITE_OTHER): Payer: BC Managed Care – PPO | Admitting: Psychiatry

## 2020-08-16 DIAGNOSIS — F5105 Insomnia due to other mental disorder: Secondary | ICD-10-CM | POA: Diagnosis not present

## 2020-08-16 DIAGNOSIS — F3342 Major depressive disorder, recurrent, in full remission: Secondary | ICD-10-CM | POA: Diagnosis not present

## 2020-08-16 DIAGNOSIS — F410 Panic disorder [episodic paroxysmal anxiety] without agoraphobia: Secondary | ICD-10-CM | POA: Diagnosis not present

## 2020-08-16 DIAGNOSIS — F411 Generalized anxiety disorder: Secondary | ICD-10-CM

## 2020-08-16 DIAGNOSIS — F09 Unspecified mental disorder due to known physiological condition: Secondary | ICD-10-CM

## 2020-08-16 MED ORDER — BUSPIRONE HCL 10 MG PO TABS: 10.0000 mg | ORAL_TABLET | Freq: Two times a day (BID) | ORAL | 0 refills | Status: DC

## 2020-08-16 NOTE — Progress Notes (Signed)
Provider Location : [facility] Patient Location : Home  Participants: Patient , Provider  Virtual Visit via Video Note  I connected with Evelyn Bentley on 08/16/20 at  4:00 PM EDT by a video enabled telemedicine application and verified that I am speaking with the correct person using two identifiers.   I discussed the limitations of evaluation and management by telemedicine and the availability of in person appointments. The patient expressed understanding and agreed to proceed.     I discussed the assessment and treatment plan with the patient. The patient was provided an opportunity to ask questions and all were answered. The patient agreed with the plan and demonstrated an understanding of the instructions.   The patient was advised to call back or seek an in-person evaluation if the symptoms worsen or if the condition fails to improve as anticipated.  Video connection was lost at less than 50% of the duration of the visit, at which time the remainder of the visit was completed through audio only   [facility] MD OP Progress Note  08/16/2020 5:16 PM Evelyn Bentley  MRN:  [id number]  Chief Complaint:  Chief Complaint    Follow-up     HPI: Evelyn Bentley is a 64 year old female, widowed, has a history of MDD, panic attacks, GAD, cognitive disorder currently lives in [place], has a history of MDD, panic attacks, GAD, insomnia, cognitive disorder was evaluated by telemedicine today.  Patient reports she is currently building a house close to her son.  She reports with the moving and all the situational stressors she does have some anxiety.  She however is coping okay.  She continues to struggle with physical problems of pain.  She does have leg cramps as well as cramps in her hands.  She reports she is currently under the care of providers for the same and has upcoming appointment scheduled for further management.  She reports she just got her cataract surgery done and is recovering  well.  Patient is compliant on medications.  Denies side effects.  She denies any suicidality, homicidality or perceptual disturbances.  Patient continues to follow-up with her therapist.  She denies any other concerns today. Visit Diagnosis:    ICD-10-CM   1. MDD (major depressive disorder), recurrent, in full remission ([place])  F33.42   2. Panic attack  F41.0 busPIRone (BUSPAR) 10 MG tablet  3. GAD (generalized anxiety disorder)  F41.1 busPIRone (BUSPAR) 10 MG tablet  4. Insomnia due to mental condition  F51.05   5. Cognitive disorder  F09     Past Psychiatric History: I have reviewed past psychiatric history from my progress note on 02/26/2018  Past Medical History:  Past Medical History:  Diagnosis Date  . Anxiety   . Arthritis   . Bursitis    Rt hip  . Complication of anesthesia    nausea  . Depression   . Fibromyalgia   . GERD (gastroesophageal reflux disease)   . Hypercholesteremia   . Hypertension   . Hypothyroidism   . Thyroid disease     Past Surgical History:  Procedure Laterality Date  . ABDOMINAL HYSTERECTOMY    . CHOLECYSTECTOMY    . COLONOSCOPY WITH PROPOFOL N/A 11/13/2016   Procedure: COLONOSCOPY WITH PROPOFOL;  Surgeon: Lollie Sails, MD;  Location: [facility] ENDOSCOPY;  Service: Endoscopy;  Laterality: N/A;  . ESOPHAGOGASTRODUODENOSCOPY (EGD) WITH PROPOFOL N/A 11/13/2016   Procedure: ESOPHAGOGASTRODUODENOSCOPY (EGD) WITH PROPOFOL;  Surgeon: Lollie Sails, MD;  Location: [facility] ENDOSCOPY;  Service: Endoscopy;  Laterality: N/A;  . ESOPHAGOGASTRODUODENOSCOPY (EGD) WITH PROPOFOL N/A 12/16/2018   Procedure: ESOPHAGOGASTRODUODENOSCOPY (EGD) WITH PROPOFOL;  Surgeon: Lollie Sails, MD;  Location: [facility] ENDOSCOPY;  Service: Endoscopy;  Laterality: N/A;  . LAPAROSCOPIC HYSTERECTOMY Bilateral 02/28/2016   Procedure: HYSTERECTOMY TOTAL LAPAROSCOPIC / BSO;  Surgeon: Honor Loh Ward, MD;  Location: [facility] ORS;  Service: Gynecology;  Laterality: Bilateral;  . NECK SURGERY     . NISSEN FUNDOPLICATION    . TUBAL LIGATION    . URETEROSCOPY WITH HOLMIUM LASER LITHOTRIPSY Left 07/03/2016   Procedure: URETEROSCOPY WITH HOLMIUM LASER LITHOTRIPSY;  Surgeon: Royston Cowper, MD;  Location: [facility] ORS;  Service: Urology;  Laterality: Left;    Family Psychiatric History: I have reviewed family psychiatric history from my progress note on 02/26/2018  Family History:  Family History  Problem Relation Age of Onset  . Depression Mother   . Anxiety disorder Mother   . Breast cancer Neg Hx     Social History: Reviewed social history from my progress note on 02/26/2018 Social History   Socioeconomic History  . Marital status: Widowed    Spouse name: Not on file  . Number of children: 2  . Years of education: Not on file  . Highest education level: High school graduate  Occupational History    Comment: fulltime  Tobacco Use  . Smoking status: Former Smoker    Start date: 11/14/1974    Quit date: 05/14/2014    Years since quitting: 6.2  . Smokeless tobacco: Never Used  Vaping Use  . Vaping Use: Every day  Substance and Sexual Activity  . Alcohol use: Not Currently    Alcohol/week: 0.0 - 1.0 standard drinks    Comment: social rare  . Drug use: No  . Sexual activity: Yes    Partners: Male    Birth control/protection: Condom  Other Topics Concern  . Not on file  Social History Narrative  . Not on file   Social Determinants of Health   Financial Resource Strain:   . Difficulty of Paying Living Expenses: Not on file  Food Insecurity:   . Worried About Charity fundraiser in the Last Year: Not on file  . Ran Out of Food in the Last Year: Not on file  Transportation Needs:   . Lack of Transportation (Medical): Not on file  . Lack of Transportation (Non-Medical): Not on file  Physical Activity:   . Days of Exercise per Week: Not on file  . Minutes of Exercise per Session: Not on file  Stress:   . Feeling of Stress : Not on file  Social Connections:   .  Frequency of Communication with Friends and Family: Not on file  . Frequency of Social Gatherings with Friends and Family: Not on file  . Attends Religious Services: Not on file  . Active Member of Clubs or Organizations: Not on file  . Attends Archivist Meetings: Not on file  . Marital Status: Not on file    Allergies:  Allergies  Allergen Reactions  . Codeine Nausea Only    Metabolic Disorder Labs: No results found for: HGBA1C, MPG No results found for: PROLACTIN No results found for: CHOL, TRIG, HDL, CHOLHDL, VLDL, LDLCALC No results found for: TSH  Therapeutic Level Labs: No results found for: LITHIUM No results found for: VALPROATE No components found for:  CBMZ  Current Medications: Current Outpatient Medications  Medication Sig Dispense Refill  . Calcium Carbonate-Vit D-Min (CALCIUM 600+D PLUS MINERALS) 600-400 MG-UNIT  TABS Take by mouth.    . fexofenadine-pseudoephedrine (ALLEGRA-D) 60-120 MG 12 hr tablet Take 1 tablet by mouth daily.    Marland Kitchen amitriptyline (ELAVIL) 25 MG tablet Take 1 tablet (25 mg total) by mouth at bedtime. 90 tablet 0  . azelastine (ASTELIN) 0.1 % nasal spray Place 1 spray into both nostrils 2 (two) times daily.    Marland Kitchen buPROPion (WELLBUTRIN XL) 300 MG 24 hr tablet Take 1 tablet (300 mg total) by mouth daily. 90 tablet 1  . busPIRone (BUSPAR) 10 MG tablet Take 1 tablet (10 mg total) by mouth 2 (two) times daily. 180 tablet 0  . Calcium Carbonate-Vitamin D 600-400 MG-UNIT tablet Take 1 tablet by mouth 2 (two) times daily.    . Cholecalciferol (VITAMIN D3) 1000 units CAPS Take 1 capsule by mouth daily.    Marland Kitchen DEXILANT 60 MG capsule Take 60 mg by mouth daily.  2  . dicyclomine (BENTYL) 10 MG capsule Take by mouth.    . fluticasone (FLONASE) 50 MCG/ACT nasal spray fluticasone 50 mcg/actuation nasal spray,suspension    . gabapentin (NEURONTIN) 300 MG capsule TAKE 300 MG TWICE A DAY FOR ONE WEEK THEN INCREASE TO 300 MG THREE TIMES A DAY    .  HYDROcodone-acetaminophen (NORCO/VICODIN) 5-325 MG tablet 1/2 to 1 tab po bid prn    . HYDROcodone-acetaminophen (NORCO/VICODIN) 5-325 MG tablet Take 0.5-1 tablets by mouth 2 (two) times daily.    . hydrOXYzine (ATARAX/VISTARIL) 10 MG tablet TAKE 1-2 TABLETS (10-20 MG TOTAL) BY MOUTH 2 (TWO) TIMES DAILY AS NEEDED (SEVERE ANXIETY SX). 120 tablet 11  . ibuprofen (ADVIL,MOTRIN) 600 MG tablet     . levothyroxine (SYNTHROID) 88 MCG tablet Take 88 mcg by mouth daily.    Marland Kitchen lisinopril-hydrochlorothiazide (PRINZIDE,ZESTORETIC) 10-12.5 MG tablet Take 1 tablet by mouth daily.   2  . meloxicam (MOBIC) 15 MG tablet daily.    . methylPREDNISolone (MEDROL DOSEPAK) 4 MG TBPK tablet TAKE 6 TABLETS ON DAY 1 AS DIRECTED ON PACKAGE AND DECREASE BY 1 TAB EACH DAY FOR A TOTAL OF 6 DAYS    . nitrofurantoin (MACRODANTIN) 100 MG capsule Take 100 mg by mouth 2 (two) times daily.  0  . nitrofurantoin (MACRODANTIN) 50 MG capsule Take by mouth daily.    . phenazopyridine (PYRIDIUM) 200 MG tablet     . predniSONE (DELTASONE) 10 MG tablet Take 6 tabs (60mg ) by mouth followed by 5 tabs (50mg ), then 4 tabs (40mg ), then 3 tabs (30mg ), then 2 tabs (20mg ), then 1 tab (10mg )    . rosuvastatin (CRESTOR) 20 MG tablet Take 20 mg by mouth daily.    . sucralfate (CARAFATE) 1 g tablet TAKE 1 TABLET (1 G TOTAL) BY MOUTH 4 (FOUR) TIMES DAILY BEFORE MEALS AND NIGHTLY    . VENTOLIN HFA 108 (90 Base) MCG/ACT inhaler     . Vitamin D, Ergocalciferol, (DRISDOL) 50000 units CAPS capsule TAKE 1 CAP BY MOUTH ONCE A WEEK  3   No current facility-administered medications for this visit.     Musculoskeletal: Strength & Muscle Tone: UTA Gait & Station: normal Patient leans: N/A  Psychiatric Specialty Exam: Review of Systems  Eyes:       S/P Rt sided eye surgery for cataract  Musculoskeletal: Positive for arthralgias and myalgias.  Psychiatric/Behavioral: The patient is nervous/anxious.   All other systems reviewed and are negative.   There  were no vitals taken for this visit.There is no height or weight on file to calculate BMI.  General Appearance:  Casual  Eye Contact:  Fair  Speech:  Clear and Coherent  Volume:  Normal  Mood:  Anxious  Affect:  Congruent  Thought Process:  Goal Directed and Descriptions of Associations: Intact  Orientation:  Full (Time, Place, and Person)  Thought Content: Logical   Suicidal Thoughts:  No  Homicidal Thoughts:  No  Memory:  Immediate;   Fair Recent;   Fair Remote;   Fair  Judgement:  Fair  Insight:  Fair  Psychomotor Activity:  Normal  Concentration:  Concentration: Fair and Attention Span: Fair  Recall:  [employer] of Knowledge: Fair  Language: Fair  Akathisia:  No  Handed:  Right  AIMS (if indicated):UTA  Assets:  Communication Skills Desire for Improvement Housing Social Support  ADL's:  Intact  Cognition: WNL  Sleep:  Fair   Screenings:   Assessment and Plan: TYONNA TALERICO is a 64 year old Caucasian female, widowed, has a history of MDD, fibromyalgia, memory changes was evaluated by telemedicine today.  Patient with psychosocial stressors of job related problems the pandemic and her own health issues.  Patient however is currently coping okay and is stable on medications.  Plan as noted below.  Plan Depression in remission Wellbutrin XL 300 mg p.o. daily-reduced dosage.  GAD-improving Elavil 25 mg p.o. daily.  She did not tolerate the higher dosage. BuSpar 10 mg p.o. twice daily Hydroxyzine 20 mg p.o. daily as needed for severe anxiety attacks  Panic attacks-stable Continue CBT Continue BuSpar as prescribed  Insomnia-stable Elavil as prescribed  [employer] testing results were reviewed while making medication changes.  Cognitive changes-patient will continue to follow-up with her psychologist and neurologist.  She did have psychological testing done in the past.  Sleep study completed-pending report.  Follow-up in clinic in 2 months or sooner if  needed.  I have spent atleast 20 minutes non face to face  with patient today. More than 50 % of the time was spent for preparing to see the patient ( e.g., review of test, records ), ordering medications and test ,psychoeducation and supportive psychotherapy and care coordination,as well as documenting clinical information in electronic health record. This note was generated in part or whole with voice recognition software. Voice recognition is usually quite accurate but there are transcription errors that can and very often do occur. I apologize for any typographical errors that were not detected and corrected.      Ursula Alert, MD 08/17/2020, 8:15 AM

## 2020-08-25 ENCOUNTER — Encounter
Admission: RE | Admit: 2020-08-25 | Discharge: 2020-08-25 | Disposition: A | Discharge status: Home / Self Care | Payer: BC Managed Care – PPO | Source: Ambulatory Visit | Attending: Orthopedic Surgery | Admitting: Orthopedic Surgery

## 2020-08-25 ENCOUNTER — Other Ambulatory Visit: Payer: Self-pay

## 2020-08-25 HISTORY — DX: Family history of other specified conditions: Z84.89

## 2020-08-25 NOTE — Patient Instructions (Addendum)
Your procedure is scheduled on: Thursday September 01, 2020. Report to Day Surgery inside [facility] 2nd floor. To find out your arrival time please call [phone] between 1PM - 3PM on Wednesday August 31, 2020.  Remember: Instructions that are not followed completely may result in serious medical risk,  up to and including death, or upon the discretion of your surgeon and anesthesiologist your  surgery may need to be rescheduled.     _X__ 1. Do not eat food after midnight the night before your procedure.                 No chewing gum or hard candies. You may drink clear liquids up to 2 hours                 before you are scheduled to arrive for your surgery- DO not drink clear                 liquids within 2 hours of the start of your surgery.                 Clear Liquids include:  water, apple juice without pulp, clear Gatorade, G2 or                  Gatorade Zero (avoid Red/Purple/Blue), Black Coffee or Tea (Do not add                 anything to coffee or tea).  __X__2.   Complete the "Ensure Clear Pre-surgery Clear Carbohydrate Drink" provided to you, 2 hours before arrival.  __X__3.  On the morning of surgery brush your teeth with toothpaste and water, you                may rinse your mouth with mouthwash if you wish.  Do not swallow any toothpaste of mouthwash.     _X__ 4.  No Alcohol for 24 hours before or after surgery.   _X__ 5.  Do Not Smoke or use e-cigarettes For 24 Hours Prior to Your Surgery.                 Do not use any chewable tobacco products for at least 6 hours prior to                 Surgery.  _X__  6.  Do not use any recreational drugs (marijuana, cocaine, heroin, ecstasy, MDMA or other)                For at least one week prior to your surgery.  Combination of these drugs with anesthesia                May have life threatening results.  __x__  7.  Notify your doctor if there is any change in your medical condition       (cold, fever, infections).     Do not wear jewelry, make-up, hairpins, clips or nail polish. Do not wear lotions, powders, or perfumes. You may wear deodorant. Do not shave 48 hours prior to surgery. Men may shave face and neck. Do not bring valuables to the hospital.    [facility] is not responsible for any belongings or valuables.  Contacts, dentures or bridgework may not be worn into surgery. Leave your suitcase in the car. After surgery it may be brought to your room. For patients admitted to the hospital, discharge time is determined by your treatment team.   Patients  discharged the day of surgery will not be allowed to drive home.   Make arrangements for someone to be with you for the first 24 hours of your Same Day Discharge.   __X__ Take these medicines the morning of surgery with A SIP OF WATER:    1. busPIRone (BUSPAR) 10 MG  2. gabapentin (NEURONTIN) 300 MG   3. levothyroxine (SYNTHROID) 100 MCG   4. hydrOXYzine (ATARAX/VISTARIL) 10 MG   5. DEXILANT 60 MG   __X__ Use CHG Soap (or wipes) as directed  __x__ Use inhalers on the day of surgery   fluticasone (FLONASE) 50 MCG/ACT nasal spray  __x__ Stop Anti-inflammatories such as ibuprofen (ADVIL), Aleve, naproxen, aspirin and or BC powders.    __x__ Stop supplements until after surgery.    __x__ Do not start any herbal supplements before your procedure.    If you have any questions regarding your pre-procedure instructions,  Please call Pre-admit Testing at [phone].

## 2020-08-30 ENCOUNTER — Other Ambulatory Visit: Payer: Self-pay

## 2020-08-30 ENCOUNTER — Encounter
Admission: RE | Admit: 2020-08-30 | Discharge: 2020-08-30 | Disposition: A | Discharge status: Home / Self Care | Payer: BC Managed Care – PPO | Source: Ambulatory Visit | Attending: Orthopedic Surgery | Admitting: Orthopedic Surgery

## 2020-08-30 DIAGNOSIS — Z01818 Encounter for other preprocedural examination: Secondary | ICD-10-CM | POA: Diagnosis present

## 2020-08-30 DIAGNOSIS — I10 Essential (primary) hypertension: Secondary | ICD-10-CM | POA: Diagnosis not present

## 2020-08-30 DIAGNOSIS — Z20822 Contact with and (suspected) exposure to covid-19: Secondary | ICD-10-CM | POA: Diagnosis not present

## 2020-08-30 LAB — BASIC METABOLIC PANEL
Anion gap: 11 (ref 5–15)
BUN: 13 mg/dL (ref 8–23)
CO2: 24 mmol/L (ref 22–32)
Calcium: 9.5 mg/dL (ref 8.9–10.3)
Chloride: 102 mmol/L (ref 98–111)
Creatinine, Ser: 0.92 mg/dL (ref 0.44–1.00)
GFR, Estimated: >60 mL/min (ref >60)
Glucose, Bld: 102 mg/dL — ABNORMAL HIGH (ref 70–99)
Potassium: 3.5 mmol/L (ref 3.5–5.1)
Sodium: 137 mmol/L (ref 135–145)

## 2020-08-30 LAB — CBC
HCT: 39.9 % (ref 36.0–46.0)
Hemoglobin: 14 g/dL (ref 12.0–15.0)
MCH: 31.5 pg (ref 26.0–34.0)
MCHC: 35.1 g/dL (ref 30.0–36.0)
MCV: 89.9 fL (ref 80.0–100.0)
Platelets: 321 10*3/uL (ref 150–400)
RBC: 4.44 MIL/uL (ref 3.87–5.11)
RDW: 12.9 % (ref 11.5–15.5)
WBC: 7.8 10*3/uL (ref 4.0–10.5)
nRBC: 0 % (ref 0.0–0.2)

## 2020-08-30 LAB — SARS CORONAVIRUS 2 (TAT 6-24 HRS): SARS Coronavirus 2: NEGATIVE

## 2020-08-31 MED ORDER — CEFAZOLIN SODIUM-DEXTROSE 2-4 GM/100ML-% IV SOLN
2.0000 g | INTRAVENOUS | Status: AC
  Administered 2020-09-01: 2 g via INTRAVENOUS

## 2020-08-31 MED ORDER — CHLORHEXIDINE GLUCONATE 0.12 % MT SOLN
15.0000 mL | Freq: Once | OROMUCOSAL | Status: AC
  Administered 2020-09-01: 15 mL via OROMUCOSAL

## 2020-08-31 MED ORDER — LACTATED RINGERS IV SOLN: INTRAVENOUS | Status: DC

## 2020-08-31 MED ORDER — ORAL CARE MOUTH RINSE: 15.0000 mL | Freq: Once | OROMUCOSAL | Status: AC

## 2020-09-01 ENCOUNTER — Encounter: Payer: Self-pay | Admitting: Orthopedic Surgery

## 2020-09-01 ENCOUNTER — Ambulatory Visit: Payer: BC Managed Care – PPO | Admitting: Anesthesiology

## 2020-09-01 ENCOUNTER — Ambulatory Visit
Admission: RE | Admit: 2020-09-01 | Discharge: 2020-09-01 | Disposition: A | Discharge status: Home / Self Care | Payer: BC Managed Care – PPO | Attending: Orthopedic Surgery | Admitting: Orthopedic Surgery

## 2020-09-01 ENCOUNTER — Other Ambulatory Visit: Payer: Self-pay

## 2020-09-01 ENCOUNTER — Encounter
Admission: RE | Disposition: A | Discharge status: Home / Self Care | Payer: Self-pay | Source: Home / Self Care | Attending: Orthopedic Surgery

## 2020-09-01 DIAGNOSIS — G5623 Lesion of ulnar nerve, bilateral upper limbs: Secondary | ICD-10-CM | POA: Insufficient documentation

## 2020-09-01 DIAGNOSIS — Z87891 Personal history of nicotine dependence: Secondary | ICD-10-CM | POA: Insufficient documentation

## 2020-09-01 DIAGNOSIS — E039 Hypothyroidism, unspecified: Secondary | ICD-10-CM | POA: Diagnosis not present

## 2020-09-01 DIAGNOSIS — Z79890 Hormone replacement therapy (postmenopausal): Secondary | ICD-10-CM | POA: Diagnosis not present

## 2020-09-01 DIAGNOSIS — F32A Depression, unspecified: Secondary | ICD-10-CM | POA: Insufficient documentation

## 2020-09-01 DIAGNOSIS — E7800 Pure hypercholesterolemia, unspecified: Secondary | ICD-10-CM | POA: Insufficient documentation

## 2020-09-01 DIAGNOSIS — I10 Essential (primary) hypertension: Secondary | ICD-10-CM | POA: Diagnosis not present

## 2020-09-01 DIAGNOSIS — Z791 Long term (current) use of non-steroidal anti-inflammatories (NSAID): Secondary | ICD-10-CM | POA: Insufficient documentation

## 2020-09-01 DIAGNOSIS — F419 Anxiety disorder, unspecified: Secondary | ICD-10-CM | POA: Insufficient documentation

## 2020-09-01 DIAGNOSIS — K219 Gastro-esophageal reflux disease without esophagitis: Secondary | ICD-10-CM | POA: Insufficient documentation

## 2020-09-01 DIAGNOSIS — G5603 Carpal tunnel syndrome, bilateral upper limbs: Secondary | ICD-10-CM | POA: Insufficient documentation

## 2020-09-01 DIAGNOSIS — Z79899 Other long term (current) drug therapy: Secondary | ICD-10-CM | POA: Insufficient documentation

## 2020-09-01 DIAGNOSIS — M797 Fibromyalgia: Secondary | ICD-10-CM | POA: Insufficient documentation

## 2020-09-01 HISTORY — PX: CARPAL TUNNEL RELEASE: SHX101

## 2020-09-01 SURGERY — CARPAL TUNNEL RELEASE
Anesthesia: General | Laterality: Right

## 2020-09-01 MED ORDER — PROPOFOL 10 MG/ML IV BOLUS
INTRAVENOUS | Status: AC
  Filled 2020-09-01: qty 20

## 2020-09-01 MED ORDER — DEXMEDETOMIDINE (PRECEDEX) IN NS 20 MCG/5ML (4 MCG/ML) IV SYRINGE
PREFILLED_SYRINGE | INTRAVENOUS | Status: DC | PRN
  Administered 2020-09-01: 8 ug via INTRAVENOUS
  Administered 2020-09-01: 12 ug via INTRAVENOUS

## 2020-09-01 MED ORDER — FENTANYL CITRATE (PF) 100 MCG/2ML IJ SOLN
INTRAMUSCULAR | Status: AC
  Filled 2020-09-01: qty 2

## 2020-09-01 MED ORDER — DEXMEDETOMIDINE (PRECEDEX) IN NS 20 MCG/5ML (4 MCG/ML) IV SYRINGE
PREFILLED_SYRINGE | INTRAVENOUS | Status: AC
  Filled 2020-09-01: qty 5

## 2020-09-01 MED ORDER — ONDANSETRON HCL 4 MG/2ML IJ SOLN
INTRAMUSCULAR | Status: AC
  Filled 2020-09-01: qty 2

## 2020-09-01 MED ORDER — CHLORHEXIDINE GLUCONATE 0.12 % MT SOLN
OROMUCOSAL | Status: AC
  Filled 2020-09-01: qty 15

## 2020-09-01 MED ORDER — KETAMINE HCL 10 MG/ML IJ SOLN
INTRAMUSCULAR | Status: DC | PRN
  Administered 2020-09-01: 30 mg via INTRAVENOUS

## 2020-09-01 MED ORDER — LIDOCAINE HCL (CARDIAC) PF 100 MG/5ML IV SOSY
PREFILLED_SYRINGE | INTRAVENOUS | Status: DC | PRN
  Administered 2020-09-01: 80 mg via INTRAVENOUS

## 2020-09-01 MED ORDER — PROPOFOL 500 MG/50ML IV EMUL
INTRAVENOUS | Status: DC | PRN
  Administered 2020-09-01: 150 ug/kg/min via INTRAVENOUS

## 2020-09-01 MED ORDER — CEFAZOLIN SODIUM-DEXTROSE 2-4 GM/100ML-% IV SOLN
INTRAVENOUS | Status: AC
  Filled 2020-09-01: qty 100

## 2020-09-01 MED ORDER — FENTANYL CITRATE (PF) 100 MCG/2ML IJ SOLN
INTRAMUSCULAR | Status: AC
  Administered 2020-09-01: 25 ug via INTRAVENOUS
  Filled 2020-09-01: qty 2

## 2020-09-01 MED ORDER — ONDANSETRON HCL 4 MG/2ML IJ SOLN: 4.0000 mg | Freq: Once | INTRAMUSCULAR | Status: DC | PRN

## 2020-09-01 MED ORDER — DEXAMETHASONE SODIUM PHOSPHATE 10 MG/ML IJ SOLN
INTRAMUSCULAR | Status: AC
  Filled 2020-09-01: qty 1

## 2020-09-01 MED ORDER — DEXAMETHASONE SODIUM PHOSPHATE 10 MG/ML IJ SOLN
INTRAMUSCULAR | Status: DC | PRN
  Administered 2020-09-01: 6 mg via INTRAVENOUS

## 2020-09-01 MED ORDER — BUPIVACAINE HCL (PF) 0.5 % IJ SOLN
INTRAMUSCULAR | Status: DC | PRN
  Administered 2020-09-01: 10 mL

## 2020-09-01 MED ORDER — FENTANYL CITRATE (PF) 100 MCG/2ML IJ SOLN
25.0000 ug | INTRAMUSCULAR | Status: DC | PRN
  Administered 2020-09-01 (×2): 25 ug via INTRAVENOUS

## 2020-09-01 MED ORDER — MEPERIDINE HCL 50 MG/ML IJ SOLN
INTRAMUSCULAR | Status: AC
  Administered 2020-09-01: 6.25 mg via INTRAVENOUS
  Filled 2020-09-01: qty 1

## 2020-09-01 MED ORDER — ONDANSETRON HCL 4 MG/2ML IJ SOLN
INTRAMUSCULAR | Status: DC | PRN
  Administered 2020-09-01: 4 mg via INTRAVENOUS

## 2020-09-01 MED ORDER — OXYCODONE-ACETAMINOPHEN 5-325 MG PO TABS
ORAL_TABLET | ORAL | Status: AC
  Administered 2020-09-01: 1 via ORAL
  Filled 2020-09-01: qty 1

## 2020-09-01 MED ORDER — KETOROLAC TROMETHAMINE 15 MG/ML IJ SOLN: 15.0000 mg | Freq: Once | INTRAMUSCULAR | Status: AC

## 2020-09-01 MED ORDER — OXYCODONE-ACETAMINOPHEN 5-325 MG PO TABS: 1.0000 | ORAL_TABLET | ORAL | Status: DC | PRN

## 2020-09-01 MED ORDER — PROPOFOL 10 MG/ML IV BOLUS
INTRAVENOUS | Status: DC | PRN
  Administered 2020-09-01: 150 mg via INTRAVENOUS
  Administered 2020-09-01: 50 mg via INTRAVENOUS

## 2020-09-01 MED ORDER — METOCLOPRAMIDE HCL 5 MG/ML IJ SOLN: 5.0000 mg | Freq: Three times a day (TID) | INTRAMUSCULAR | Status: DC | PRN

## 2020-09-01 MED ORDER — METOCLOPRAMIDE HCL 10 MG PO TABS: 5.0000 mg | ORAL_TABLET | Freq: Three times a day (TID) | ORAL | Status: DC | PRN

## 2020-09-01 MED ORDER — MEPERIDINE HCL 50 MG/ML IJ SOLN: 6.2500 mg | INTRAMUSCULAR | Status: DC | PRN

## 2020-09-01 MED ORDER — ACETAMINOPHEN 10 MG/ML IV SOLN
1000.0000 mg | Freq: Once | INTRAVENOUS | Status: DC | PRN
  Administered 2020-09-01: 1000 mg via INTRAVENOUS

## 2020-09-01 MED ORDER — SODIUM CHLORIDE 0.9 % IV SOLN: INTRAVENOUS | Status: DC

## 2020-09-01 MED ORDER — PROPOFOL 500 MG/50ML IV EMUL
INTRAVENOUS | Status: AC
  Filled 2020-09-01: qty 50

## 2020-09-01 MED ORDER — ONDANSETRON HCL 4 MG PO TABS: 4.0000 mg | ORAL_TABLET | Freq: Four times a day (QID) | ORAL | Status: DC | PRN

## 2020-09-01 MED ORDER — ONDANSETRON HCL 4 MG/2ML IJ SOLN: 4.0000 mg | Freq: Four times a day (QID) | INTRAMUSCULAR | Status: DC | PRN

## 2020-09-01 MED ORDER — OXYCODONE-ACETAMINOPHEN 5-325 MG PO TABS
1.0000 | ORAL_TABLET | ORAL | 0 refills | Status: DC | PRN
Start: 2020-09-01 — End: 2020-12-14

## 2020-09-01 MED ORDER — FENTANYL CITRATE (PF) 100 MCG/2ML IJ SOLN
INTRAMUSCULAR | Status: DC | PRN
  Administered 2020-09-01 (×4): 25 ug via INTRAVENOUS

## 2020-09-01 MED ORDER — ACETAMINOPHEN 10 MG/ML IV SOLN
INTRAVENOUS | Status: AC
  Filled 2020-09-01: qty 100

## 2020-09-01 MED ORDER — KETOROLAC TROMETHAMINE 15 MG/ML IJ SOLN
INTRAMUSCULAR | Status: AC
  Administered 2020-09-01: 15 mg via INTRAVENOUS
  Filled 2020-09-01: qty 1

## 2020-09-01 SURGICAL SUPPLY — 33 items
APL PRP STRL LF DISP 70% ISPRP (MISCELLANEOUS) ×1
BNDG ELASTIC 3X5.8 VLCR STR LF (GAUZE/BANDAGES/DRESSINGS) ×3 IMPLANT
BNDG ESMARK 4X12 TAN STRL LF (GAUZE/BANDAGES/DRESSINGS) ×3 IMPLANT
CANISTER SUCT 1200ML W/VALVE (MISCELLANEOUS) ×3 IMPLANT
CHLORAPREP W/TINT 26 (MISCELLANEOUS) ×3 IMPLANT
COVER WAND RF STERILE (DRAPES) ×3 IMPLANT
CUFF TOURN SGL QUICK 18X4 (TOURNIQUET CUFF) IMPLANT
ELECT CAUTERY NDL 2.0 MIC (NEEDLE) ×1 IMPLANT
ELECT CAUTERY NEEDLE 2.0 MIC (NEEDLE) ×3 IMPLANT
ELECT CAUTERY NEEDLE TIP 1.0 (MISCELLANEOUS) ×3
ELECTRODE CAUTERY NEDL TIP 1.0 (MISCELLANEOUS) ×1 IMPLANT
GAUZE SPONGE 4X4 12PLY STRL (GAUZE/BANDAGES/DRESSINGS) ×3 IMPLANT
GAUZE XEROFORM 1X8 LF (GAUZE/BANDAGES/DRESSINGS) ×3 IMPLANT
GLOVE BIOGEL PI IND STRL 9 (GLOVE) ×1 IMPLANT
GLOVE BIOGEL PI INDICATOR 9 (GLOVE) ×2
GLOVE SURG ORTHO 9.0 STRL STRW (GLOVE) ×3 IMPLANT
GLOVE SURG SYN 9.0  PF PI (GLOVE) ×3
GLOVE SURG SYN 9.0 PF PI (GLOVE) ×1 IMPLANT
GOWN SRG 2XL LVL 4 RGLN SLV (GOWNS) ×1 IMPLANT
GOWN STRL NON-REIN 2XL LVL4 (GOWNS) ×3
GOWN STRL REUS W/ TWL LRG LVL3 (GOWN DISPOSABLE) ×1 IMPLANT
GOWN STRL REUS W/TWL LRG LVL3 (GOWN DISPOSABLE) ×3
KIT TURNOVER KIT A (KITS) ×3 IMPLANT
NS IRRIG 500ML POUR BTL (IV SOLUTION) ×3 IMPLANT
PACK EXTREMITY (MISCELLANEOUS) ×3 IMPLANT
PAD CAST CTTN 4X4 STRL (SOFTGOODS) ×1 IMPLANT
PADDING CAST COTTON 4X4 STRL (SOFTGOODS) ×3
SCALPEL PROTECTED #15 DISP (BLADE) ×6 IMPLANT
STOCKINETTE 48X4 2 PLY STRL (GAUZE/BANDAGES/DRESSINGS) ×1 IMPLANT
STOCKINETTE STRL 4IN 9604848 (GAUZE/BANDAGES/DRESSINGS) ×3 IMPLANT
SUT ETHILON 4-0 (SUTURE) ×3
SUT ETHILON 4-0 FS2 18XMFL BLK (SUTURE) ×1
SUTURE ETHLN 4-0 FS2 18XMF BLK (SUTURE) ×1 IMPLANT

## 2020-09-01 NOTE — Anesthesia Postprocedure Evaluation (Signed)
Anesthesia Post Note  Patient: Evelyn Bentley  Procedure(s) Performed: Right carpal tunnel release (Right ) Right cubital tunnel release (Right )  Patient location during evaluation: PACU Anesthesia Type: General Level of consciousness: awake and alert Pain management: pain level controlled Vital Signs Assessment: post-procedure vital signs reviewed and stable Respiratory status: spontaneous breathing, nonlabored ventilation, respiratory function stable and patient connected to nasal cannula oxygen Cardiovascular status: blood pressure returned to baseline and stable Postop Assessment: no apparent nausea or vomiting Anesthetic complications: no   No complications documented.   Last Vitals:  Vitals:   09/01/20 1341 09/01/20 1400  BP: (!) 116/58 121/64  Pulse: 65 72  Resp: 11 16  Temp:  (!) 36.2 C  SpO2: 99% 98%    Last Pain:  Vitals:   09/01/20 1400  TempSrc: Temporal  PainSc:                  [name]

## 2020-09-01 NOTE — Anesthesia Preprocedure Evaluation (Signed)
Anesthesia Evaluation  Patient identified by MRN, date of birth, ID band Patient awake    Reviewed: Allergy & Precautions, NPO status , Patient's Chart, lab work & pertinent test results  History of Anesthesia Complications Negative for: history of anesthetic complications  Airway Mallampati: II  TM Distance: >3 FB Neck ROM: Limited    Dental no notable dental hx. (+) Teeth Intact   Pulmonary neg pulmonary ROS, neg sleep apnea, neg COPD, Patient abstained from smoking.Not current smoker, former smoker,    Pulmonary exam normal breath sounds clear to auscultation       Cardiovascular Exercise Tolerance: Good METShypertension, (-) CAD and (-) Past MI (-) dysrhythmias  Rhythm:Regular Rate:Normal - Systolic murmurs    Neuro/Psych PSYCHIATRIC DISORDERS Anxiety Depression S/p cervical spine fusions  Neuromuscular disease    GI/Hepatic GERD  Medicated and Controlled,(+)     (-) substance abuse  ,   Endo/Other  neg diabetesHypothyroidism   Renal/GU negative Renal ROS     Musculoskeletal  (+) Fibromyalgia -  Abdominal   Peds  Hematology   Anesthesia Other Findings Past Medical History: No date: Anxiety No date: Arthritis No date: Bursitis     Comment:  Rt hip No date: Complication of anesthesia     Comment:  nausea No date: Depression No date: Family history of adverse reaction to anesthesia     Comment:  mother, extensive vomiting  No date: Fibromyalgia No date: GERD (gastroesophageal reflux disease) No date: Hypercholesteremia No date: Hypertension No date: Hypothyroidism No date: Thyroid disease  Reproductive/Obstetrics                             Anesthesia Physical Anesthesia Plan  ASA: II  Anesthesia Plan: General   Post-op Pain Management:    Induction: Intravenous  PONV Risk Score and Plan: 3 and Ondansetron, Dexamethasone and Midazolam  Airway Management Planned:  LMA  Additional Equipment: None  Intra-op Plan:   Post-operative Plan: Extubation in OR  Informed Consent: I have reviewed the patients History and Physical, chart, labs and discussed the procedure including the risks, benefits and alternatives for the proposed anesthesia with the patient or authorized representative who has indicated his/her understanding and acceptance.     Dental advisory given  Plan Discussed with: CRNA and Surgeon  Anesthesia Plan Comments: (Discussed risks of anesthesia with patient, including PONV, sore throat, lip/dental damage. Rare risks discussed as well, such as cardiorespiratory and neurological sequelae. Patient understands.)        Anesthesia Quick Evaluation

## 2020-09-01 NOTE — Transfer of Care (Signed)
Immediate Anesthesia Transfer of Care Note  Patient: Evelyn Bentley  Procedure(s) Performed: Right carpal tunnel release (Right ) Right cubital tunnel release (Right )  Patient Location: PACU  Anesthesia Type:General  Level of Consciousness: drowsy  Airway & Oxygen Therapy: Patient Spontanous Breathing and Patient connected to face mask oxygen  Post-op Assessment: Report given to RN and Post -op Vital signs reviewed and stable  Post vital signs: Reviewed and stable  Last Vitals:  Vitals Value Taken Time  BP 124/74 09/01/20 1157  Temp 36.4 C 09/01/20 1157  Pulse 73 09/01/20 1201  Resp 18 09/01/20 1201  SpO2 99 % 09/01/20 1201  Vitals shown include unvalidated device data.  Last Pain:  Vitals:   09/01/20 1157  TempSrc:   PainSc: 8          Complications: No complications documented.

## 2020-09-01 NOTE — Anesthesia Procedure Notes (Signed)
Procedure Name: LMA Insertion Date/Time: 09/01/2020 11:01 AM Performed by: Lia Foyer, CRNA Pre-anesthesia Checklist: Patient identified, Emergency Drugs available, Suction available and Patient being monitored Patient Re-evaluated:Patient Re-evaluated prior to induction Oxygen Delivery Method: Circle system utilized Preoxygenation: Pre-oxygenation with 100% oxygen Induction Type: IV induction Ventilation: Mask ventilation without difficulty LMA: LMA inserted LMA Size: 3.5 Tube type: Oral Number of attempts: 1 Airway Equipment and Method: Oral airway and Patient positioned with wedge pillow Placement Confirmation: positive ETCO2 and breath sounds checked- equal and bilateral Tube secured with: Tape Dental Injury: Teeth and Oropharynx as per pre-operative assessment

## 2020-09-01 NOTE — Discharge Instructions (Addendum)
Pain medicine as directed. Loosen Ace wrap at the wrist prior to dismissal and if fingers swell. Sling for comfort. Keep dressings clean and dry until return visit. Work on finger motion is much as you can. Call office if you are having problems.   AMBULATORY SURGERY  DISCHARGE INSTRUCTIONS   1) The drugs that you were given will stay in your system until tomorrow so for the next 24 hours you should not:  A) Drive an automobile B) Make any legal decisions C) Drink any alcoholic beverage   2) You may resume regular meals tomorrow.  Today it is better to start with liquids and gradually work up to solid foods.  You may eat anything you prefer, but it is better to start with liquids, then soup and crackers, and gradually work up to solid foods.   3) Please notify your doctor immediately if you have any unusual bleeding, trouble breathing, redness and pain at the surgery site, drainage, fever, or pain not relieved by medication.    4) Additional Instructions:        Please contact your physician with any problems or Same Day Surgery at [phone], Monday through Friday 6 am to 4 pm, or [place] at [facility] number at [phone].

## 2020-09-01 NOTE — Op Note (Signed)
09/01/2020  11:55 AM  PATIENT:  Evelyn Bentley  64 y.o. female  PRE-OPERATIVE DIAGNOSIS:  Carpal tunnel syndrome, bilateral upper limbs G56.03 Cubital tunnel syndrome, bilateral G56.23  POST-OPERATIVE DIAGNOSIS:  Carpal tunnel syndrome, bilateral upper limbs G56.03  PROCEDURE:  Procedure(s): Right carpal tunnel release (Right) Right cubital tunnel release (Right)  SURGEON: Laurene Footman, MD  ASSISTANTS: None  ANESTHESIA:   general  EBL:  Total I/O In: 100 [IV Piggyback:100] Out: -   BLOOD ADMINISTERED:none  DRAINS: none   LOCAL MEDICATIONS USED:  MARCAINE     SPECIMEN:  No Specimen  DISPOSITION OF SPECIMEN:  N/A  COUNTS:  YES  TOURNIQUET:   Total Tourniquet Time Documented: Upper Arm (Right) - 12 minutes Total: Upper Arm (Right) - 12 minutes   IMPLANTS: None  DICTATION: .Dragon Dictation patient was brought to the operating room and after adequate general anesthesia was obtained the right arm was prepped and draped in the usual sterile fashion with a tourniquet applied the upper arm.  After patient identification and timeout procedure was carried out incision was made just posterior to the medial epicondyle between the medial epicondyle and the olecranon.  Skin and subcutaneous tissue spread preserving cutaneous nerves much as possible.  The ulnar nerve could be palpated and the was unroofed and opened proximally then going distally the nerve was noted to have apparent compression at the level of the medial epicondyle distally the nerve appeared normal and was identified and visualized to the muscle when it penetrated distally.  Going proximally there did not appear to be compression beyond about 2 cm proximal to the medial epicondyle with release no further more proximal decompression was required.  The wound is irrigated and then closed with simple erupted 3-0 nylon skin sutures.  Tourniquet was raised at this point for the carpal tunnel release with a 2 cm incision  made in line with the ring metacarpal the skin and subcutaneous tissue spread transverse carpal ligament identified with some aberrant thenar musculature overlying the ligament this was elevated radially.  The ligament was then opened and a vascular hemostat placed deep to protect the underlying structures with release carried out distally until fat was noted around the nerve and then proximally to about 2 cm proximal to the wrist flexion crease there appeared to be an area of compression in the mid carpal tunnel approximately a centimeter in length where the nerve was initially blanched but after release there is good vascular blush there is mild flexor tenosynovitis present and no masses noted.  The wound was infiltrated with 10 cc of half percent Sensorcaine closed with simple interrupted nylon skin sutures Xeroform 4 x 4's web roll and Ace wrap applied to both incisions.  Tourniquet let down at the close of the case.  PLAN OF CARE: Discharge to home after PACU  PATIENT DISPOSITION:  PACU - hemodynamically stable.

## 2020-09-01 NOTE — Progress Notes (Signed)
   09/01/20 0800  Clinical Encounter Type  Visited With Patient and family together  Visit Type Initial  Referral From Chaplain  Consult/Referral To Chaplain  While rounding SDS waiting area, chaplain briefly visited with PT and her daughter to see how they were doing. Pt said she was fine and daughter said likewise. Chaplain wished them well and left.

## 2020-09-01 NOTE — H&P (Signed)
Chief Complaint  Patient presents with  . Right Hand - Pain  . Left Hand - Pain   [place] is a 64 y.o. female who presents today for evaluation of bilateral hand pain numbness and tingling. Patient states for 1 year she had progressive pain numbness and tingling in both hands. She describes constant tingling throughout the day throughout all 5 digits. Symptoms are worse with activities such as sleeping and driving and working with her hands. 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 Patient has tried bracing with mild intermittent relief. Her hands will awaken her at night a couple nights a week. She works in an office setting performs ladder repetitive activities. She is right-hand dominant. She notices weakness with writing, lifting. She has a hard time buttoning close due to not being able to feel the buttons.  12/22/2018 EMG IMPRESSION: Abnormal study. 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    Past Medical History: Past Medical History:  Diagnosis Date  . Arthritis  . Bursitis  . Chronic gastritis 11/13/2016  . Depression  . Diverticulosis 11/13/2016  . Duodenitis 11/13/2016  . Erosive esophagitis 11/13/2016  . Gastric ulcer without hemorrhage or perforation 11/13/2016  . Hyperplastic colon polyp 11/13/2016  . Hypertension  . Hypothyroid, unspecified  . Migraine  . Tubular adenoma of colon, unspecified 11/13/2016   Past Surgical History: Past Surgical History:  Procedure Laterality Date  . 2 neck surgeries  . CHOLECYSTECTOMY  . COLONOSCOPY 11/13/2016  Tubular adenoma of colon/Hyperplastic colon polyp/Diverticulosis/Repeat 73yrs/MUS   . EGD 11/13/2016  Chronic gastritis/Duodenitis/Non-bleeding gastric ulcer/Erosive esophagitis/No Repeat/MUS  . EGD 12/16/2018  GERD/No Repeat/MUS  . FUNDOPLASTY TRANSTHORACIC   Past Family History: Family History  Problem Relation Age of Onset  . Thyroid disease Mother  . Diabetes Mother  . Gallbladder disease Mother  . Neck pain Mother  . Arthritis Mother  . Fibromyalgia Mother  . No Known Problems Father  . Arthritis Maternal Grandmother  . Diabetes Maternal Grandmother  . No Known Problems Maternal Grandfather  . No Known Problems Paternal Grandmother  . No Known Problems Paternal Grandfather  . Charcot-Marie-Tooth disease Daughter  . Colon cancer Neg Hx  . Colon polyps Neg Hx  . Liver disease Neg Hx  . Rectal cancer Neg Hx  . Ulcers Neg Hx   Medications: Current Outpatient Medications Ordered in Epic  Medication Sig Dispense Refill  . ALLERGY RELIEF-D,FEXOFENADINE, 60-120 mg ER tablet Take 1 tablet by mouth once daily  . amitriptyline (ELAVIL) 25 MG tablet Take 1 tablet by mouth nightly At bed time  . azelastine (ASTELIN) 137 mcg nasal spray by Nasal route  . buPROPion (WELLBUTRIN XL) 300 MG XL tablet Take by mouth  . busPIRone (BUSPAR) 10 MG tablet TAKE 1 TABLET BY MOUTH TWICE A DAY  . calcium carbonate-vit D3-min 600 mg calcium- 400 unit Tab Take by mouth  . dexlansoprazole (DEXILANT) 60 mg DR capsule Take 1 capsule by mouth once daily  . DULoxetine (CYMBALTA) 60 MG DR capsule TAKE 1 CAPSULE (60 MG TOTAL) BY MOUTH AT BEDTIME. TO BE TAKEN ALONG WITH 20 MG  . ergocalciferol, vitamin D2, 50,000 unit capsule Take 1 capsule (50,000 Units total) by mouth twice a week. (Patient taking differently: Take 50,000 Units by mouth  once a week.  ) 8 capsule 3  . fluticasone (FLONASE) 50 mcg/actuation nasal spray Place 2 sprays into both nostrils once daily.  Marland Kitchen gabapentin (NEURONTIN) 300 MG capsule Take 1 capsule (300 mg total) by mouth 2 (two) times daily 180 capsule 1  .  hydrOXYzine HCl (ATARAX) 10 MG tablet Take 1 tablet by mouth 2 (two) times daily  . ibuprofen (ADVIL,MOTRIN) 600 MG tablet Take 1 tablet by mouth every 6 (six) hours as needed 3  . levothyroxine (SYNTHROID) 125 MCG tablet Take 125 mcg by mouth once daily  . lisinopril-hydrochlorothiazide (PRINZIDE,ZESTORETIC) 10-12.5 mg tablet Take 1 tablet by mouth once daily.  . meloxicam (MOBIC) 15 MG tablet daily  . methylPREDNISolone (MEDROL DOSEPACK) 4 mg tablet TAKE 6 TABLETS ON DAY 1 AS DIRECTED ON PACKAGE AND DECREASE BY 1 TAB EACH DAY FOR A TOTAL OF 6 DAYS  . rosuvastatin (CRESTOR) 10 MG tablet Take 1 tablet by mouth once daily  . VENTOLIN HFA 90 mcg/actuation inhaler TAKE 1 TABLET BY MOUTH EVERY 4 TO 6 HOURS AS NEEDED   No current Epic-ordered facility-administered medications on file.   Allergies: Allergies  Allergen Reactions  . Codeine Nausea and Vomiting    Review of Systems:  A comprehensive 14 point ROS was performed, reviewed by me today, and the pertinent orthopaedic findings are documented in the HPI.  Exam: BP 112/84  Ht 152.4 cm (5')  Wt 80.9 kg (178 lb 6.4 oz)  BMI 34.84 kg/m  General:  Well developed, well nourished, no apparent distress, normal affect, normal gait with no antalgic component.   HEENT: Head normocephalic, atraumatic, PERRL.   Abdomen: Soft, non tender, non distended, Bowel sounds present.  Heart: Examination of the heart reveals regular, rate, and rhythm. There is no murmur noted on ascultation. There is a normal apical pulse.  Lungs: Lungs are clear to auscultation. There is no wheeze, rhonchi, or crackles. There is normal expansion of bilateral chest walls.   Bilateral upper extremities: Examination of bilateral upper extremities show patient has no swelling warmth or erythema. She has full range of motion of the elbows wrist and digits. She has positive Tinel's sign bilaterally with tenderness along the cubital tunnels reproducing pain into the  fourth and fifth digits and ulnar aspect of the forearm. She has slight thenar atrophy bilaterally. She has slight decrease sensation in the median nerve distribution bilaterally. No noticeable weakness with grip strength biceps, triceps or interosseous strength. Positive Tinel's and Phalen sign of the right greater than left wrist.  12/22/2018 EMG IMPRESSION: Abnormal study. 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    Impression: Carpal tunnel syndrome, bilateral upper limbs [G56.03] Carpal tunnel syndrome, bilateral upper limbs (primary encounter diagnosis) Cubital tunnel syndrome, bilateral  Plan:  101. 64 year old female with moderate to severe carpal tunnel and ulnar neuropathy bilaterally. Symptoms greater than 1 year progressively getting worse despite conservative treatment with bracing. She is beginning to have weakness and constant numbness with sensation loss. Physical exam history and nerve conduction studies from October 2020 consistent with carpal tunnel and cubital tunnel syndrome. Risks, benefits, complications of a right carpal tunnel release and right cubital tunnel release have been discussed with the patient. Patient has agreed and consented to the procedure. Would recommend considering left-sided carpal tunnel and cubital tunnel release 6 weeks following right sided release.  This note was generated in part with voice recognition  software and I apologize for any typographical errors that were not detected and corrected.  Nunzio Cobbs Gaines MPA-C    Reviewedr H+P No changes noted.

## 2020-09-02 ENCOUNTER — Encounter: Payer: Self-pay | Admitting: Orthopedic Surgery

## 2020-10-18 ENCOUNTER — Other Ambulatory Visit: Payer: Self-pay

## 2020-10-18 ENCOUNTER — Telehealth (INDEPENDENT_AMBULATORY_CARE_PROVIDER_SITE_OTHER): Payer: BC Managed Care – PPO | Admitting: Psychiatry

## 2020-10-18 ENCOUNTER — Encounter: Payer: Self-pay | Admitting: Psychiatry

## 2020-10-18 DIAGNOSIS — F411 Generalized anxiety disorder: Secondary | ICD-10-CM | POA: Diagnosis not present

## 2020-10-18 DIAGNOSIS — F410 Panic disorder [episodic paroxysmal anxiety] without agoraphobia: Secondary | ICD-10-CM

## 2020-10-18 DIAGNOSIS — F3342 Major depressive disorder, recurrent, in full remission: Secondary | ICD-10-CM

## 2020-10-18 DIAGNOSIS — F5105 Insomnia due to other mental disorder: Secondary | ICD-10-CM | POA: Diagnosis not present

## 2020-10-18 MED ORDER — AMITRIPTYLINE HCL 25 MG PO TABS: 25.0000 mg | ORAL_TABLET | Freq: Every day | ORAL | 0 refills | Status: DC

## 2020-10-18 MED ORDER — BUSPIRONE HCL 10 MG PO TABS: 10.0000 mg | ORAL_TABLET | Freq: Two times a day (BID) | ORAL | 0 refills | Status: DC

## 2020-10-18 NOTE — Progress Notes (Signed)
Virtual Visit via Video Note  I connected with Evelyn Bentley on 10/18/20 at  4:00 PM EST by a video enabled telemedicine application and verified that I am speaking with the correct person using two identifiers.  Location Provider Location : [facility] Patient Location : Home  Participants: Patient , Provider   I discussed the limitations of evaluation and management by telemedicine and the availability of in person appointments. The patient expressed understanding and agreed to proceed.   I discussed the assessment and treatment plan with the patient. The patient was provided an opportunity to ask questions and all were answered. The patient agreed with the plan and demonstrated an understanding of the instructions.   The patient was advised to call back or seek an in-person evaluation if the symptoms worsen or if the condition fails to improve as anticipated.   [place] MD OP Progress Note  10/18/2020 4:24 PM Evelyn Bentley  MRN:  [id number]  Chief Complaint:  Chief Complaint    Follow-up     HPI: Evelyn Bentley is a 64 year old female, widowed, employed has a history of MDD, panic attacks, GAD, cognitive disorder currently lives in [place], was evaluated by telemedicine today.  Patient today reports she is currently struggling with upper respiratory tract infection symptoms.  She reports she feels congested and also has runny nose.  She got tested for COVID-19 a week ago and it came back negative.  She is currently on medications which does seem to help some.  Patient reports overall mood wise she is doing okay.  She denies any mood swings.  She reports sleep is good on the amitriptyline.  Patient reports work is busy which can be stressful at times.  She however has been coping okay.  She denies any suicidality, homicidality and perceptual disturbances.  Patient reports she had bilateral cataract surgery and she recovered well.  She denies any other concerns today.  Visit  Diagnosis:    ICD-10-CM   1. MDD (major depressive disorder), recurrent, in full remission ([place])  F33.42   2. Panic attack  F41.0 amitriptyline (ELAVIL) 25 MG tablet    busPIRone (BUSPAR) 10 MG tablet  3. GAD (generalized anxiety disorder)  F41.1 amitriptyline (ELAVIL) 25 MG tablet    busPIRone (BUSPAR) 10 MG tablet  4. Insomnia due to mental condition  F51.05     Past Psychiatric History: I have reviewed past psychiatric history from my progress note on 02/26/2018  Past Medical History:  Past Medical History:  Diagnosis Date  . Anxiety   . Arthritis   . Bursitis    Rt hip  . Complication of anesthesia    nausea  . Depression   . Family history of adverse reaction to anesthesia    mother, extensive vomiting   . Fibromyalgia   . GERD (gastroesophageal reflux disease)   . Hypercholesteremia   . Hypertension   . Hypothyroidism   . Thyroid disease     Past Surgical History:  Procedure Laterality Date  . ABDOMINAL HYSTERECTOMY    . CARPAL TUNNEL RELEASE Right 09/01/2020   Procedure: Right carpal tunnel release;  Surgeon: Hessie Knows, MD;  Location: [facility] ORS;  Service: Orthopedics;  Laterality: Right;  . CHOLECYSTECTOMY    . COLONOSCOPY WITH PROPOFOL N/A 11/13/2016   Procedure: COLONOSCOPY WITH PROPOFOL;  Surgeon: Lollie Sails, MD;  Location: [facility] ENDOSCOPY;  Service: Endoscopy;  Laterality: N/A;  . ESOPHAGOGASTRODUODENOSCOPY (EGD) WITH PROPOFOL N/A 11/13/2016   Procedure: ESOPHAGOGASTRODUODENOSCOPY (EGD) WITH PROPOFOL;  Surgeon:  Lollie Sails, MD;  Location: [facility] ENDOSCOPY;  Service: Endoscopy;  Laterality: N/A;  . ESOPHAGOGASTRODUODENOSCOPY (EGD) WITH PROPOFOL N/A 12/16/2018   Procedure: ESOPHAGOGASTRODUODENOSCOPY (EGD) WITH PROPOFOL;  Surgeon: Lollie Sails, MD;  Location: [facility] ENDOSCOPY;  Service: Endoscopy;  Laterality: N/A;  . LAPAROSCOPIC HYSTERECTOMY Bilateral 02/28/2016   Procedure: HYSTERECTOMY TOTAL LAPAROSCOPIC / BSO;  Surgeon: Honor Loh Ward, MD;  Location:  [facility] ORS;  Service: Gynecology;  Laterality: Bilateral;  . NECK SURGERY    . NISSEN FUNDOPLICATION    . TUBAL LIGATION    . URETEROSCOPY WITH HOLMIUM LASER LITHOTRIPSY Left 07/03/2016   Procedure: URETEROSCOPY WITH HOLMIUM LASER LITHOTRIPSY;  Surgeon: Royston Cowper, MD;  Location: [facility] ORS;  Service: Urology;  Laterality: Left;    Family Psychiatric History: I have reviewed family psychiatric history from my progress note on 02/26/2018  Family History:  Family History  Problem Relation Age of Onset  . Depression Mother   . Anxiety disorder Mother   . Breast cancer Neg Hx     Social History: I have reviewed social history from my progress note on 02/26/2018 Social History   Socioeconomic History  . Marital status: Widowed    Spouse name: Not on file  . Number of children: 2  . Years of education: Not on file  . Highest education level: High school graduate  Occupational History    Comment: fulltime  Tobacco Use  . Smoking status: Former Smoker    Start date: 11/14/1974    Quit date: 05/14/2014    Years since quitting: 6.4  . Smokeless tobacco: Never Used  Vaping Use  . Vaping Use: Every day  Substance and Sexual Activity  . Alcohol use: Not Currently    Alcohol/week: 0.0 - 1.0 standard drinks  . Drug use: No  . Sexual activity: Yes    Partners: Male    Birth control/protection: Condom  Other Topics Concern  . Not on file  Social History Narrative  . Not on file   Social Determinants of Health   Financial Resource Strain:   . Difficulty of Paying Living Expenses: Not on file  Food Insecurity:   . Worried About Charity fundraiser in the Last Year: Not on file  . Ran Out of Food in the Last Year: Not on file  Transportation Needs:   . Lack of Transportation (Medical): Not on file  . Lack of Transportation (Non-Medical): Not on file  Physical Activity:   . Days of Exercise per Week: Not on file  . Minutes of Exercise per Session: Not on file  Stress:   . Feeling  of Stress : Not on file  Social Connections:   . Frequency of Communication with Friends and Family: Not on file  . Frequency of Social Gatherings with Friends and Family: Not on file  . Attends Religious Services: Not on file  . Active Member of Clubs or Organizations: Not on file  . Attends Archivist Meetings: Not on file  . Marital Status: Not on file    Allergies:  Allergies  Allergen Reactions  . Codeine Nausea Only    Metabolic Disorder Labs: No results found for: HGBA1C, MPG No results found for: PROLACTIN No results found for: CHOL, TRIG, HDL, CHOLHDL, VLDL, LDLCALC No results found for: TSH  Therapeutic Level Labs: No results found for: LITHIUM No results found for: VALPROATE No components found for:  CBMZ  Current Medications: Current Outpatient Medications  Medication Sig Dispense Refill  . prednisoLONE  acetate (PRED FORTE) 1 % ophthalmic suspension     . acetaminophen (TYLENOL) 650 MG CR tablet Take 1,300 mg by mouth every 8 (eight) hours as needed for pain.    Marland Kitchen amitriptyline (ELAVIL) 25 MG tablet Take 1 tablet (25 mg total) by mouth at bedtime. 90 tablet 0  . azelastine (ASTELIN) 0.1 % nasal spray Place 1 spray into both nostrils 2 (two) times daily as needed for allergies.     Marland Kitchen buPROPion (WELLBUTRIN XL) 300 MG 24 hr tablet Take 1 tablet (300 mg total) by mouth daily. 90 tablet 1  . busPIRone (BUSPAR) 10 MG tablet Take 1 tablet (10 mg total) by mouth 2 (two) times daily. 180 tablet 0  . Calcium Carbonate-Vitamin D 600-400 MG-UNIT tablet Take 1 tablet by mouth 2 (two) times daily.    . chlorpheniramine (CHLOR-TRIMETON) 4 MG tablet Take 4 mg by mouth 2 (two) times daily as needed for allergies.    . DEXILANT 60 MG capsule Take 60 mg by mouth daily.  2  . fluticasone (FLONASE) 50 MCG/ACT nasal spray Place 1 spray into both nostrils daily as needed for allergies.     Marland Kitchen gabapentin (NEURONTIN) 300 MG capsule Take 300 mg by mouth 2 (two) times daily.     .  hydrOXYzine (ATARAX/VISTARIL) 10 MG tablet TAKE 1-2 TABLETS (10-20 MG TOTAL) BY MOUTH 2 (TWO) TIMES DAILY AS NEEDED (SEVERE ANXIETY SX). (Patient taking differently: Take 10-20 mg by mouth 2 (two) times daily as needed for anxiety. ) 120 tablet 11  . ibuprofen (ADVIL) 200 MG tablet Take 400 mg by mouth every 6 (six) hours as needed for headache or moderate pain.    Marland Kitchen levothyroxine (SYNTHROID) 100 MCG tablet Take 100 mcg by mouth daily.    Marland Kitchen lisinopril-hydrochlorothiazide (PRINZIDE,ZESTORETIC) 10-12.5 MG tablet Take 1 tablet by mouth daily.   2  . oxyCODONE-acetaminophen (PERCOCET) 5-325 MG tablet Take 1 tablet by mouth every 4 (four) hours as needed for severe pain. 20 tablet 0  . PREDNISOLON-MOXIFLOX-NEPAFENAC OP Place 1 drop into the right eye See admin instructions. Instill 1 drop 3 times daily for 1 week then 2 times daily for 1 week then 1 time daily for 1 week then stop    . prednisoLONE acetate (PRED FORTE) 1 % ophthalmic suspension SMARTSIG:In Eye(s)    . rosuvastatin (CRESTOR) 20 MG tablet Take 20 mg by mouth daily.    . Vitamin D, Ergocalciferol, (DRISDOL) 50000 units CAPS capsule Take 50,000 Units by mouth every Monday.   3   No current facility-administered medications for this visit.     Musculoskeletal: Strength & Muscle Tone: UTA Gait & Station: normal Patient leans: N/A  Psychiatric Specialty Exam: Review of Systems  HENT: Positive for congestion, rhinorrhea and sneezing.   Psychiatric/Behavioral: The patient is nervous/anxious.   All other systems reviewed and are negative.   There were no vitals taken for this visit.There is no height or weight on file to calculate BMI.  General Appearance: Casual  Eye Contact:  Fair  Speech:  Clear and Coherent  Volume:  Normal  Mood:  Anxious coping well  Affect:  Congruent  Thought Process:  Goal Directed and Descriptions of Associations: Intact  Orientation:  Full (Time, Place, and Person)  Thought Content: Logical   Suicidal  Thoughts:  No  Homicidal Thoughts:  No  Memory:  Immediate;   Fair Recent;   Fair Remote;   Fair  Judgement:  Fair  Insight:  Fair  Psychomotor Activity:  Normal  Concentration:  Concentration: Fair and Attention Span: Fair  Recall:  [employer] of Knowledge: Fair  Language: Fair  Akathisia:  No  Handed:  Right  AIMS (if indicated): UTA  Assets:  Communication Skills Desire for Improvement Housing Social Support  ADL's:  Intact  Cognition: WNL  Sleep:  Fair   Screenings:   Assessment and Plan: GENECIS VELEY is a 64 year old Caucasian female, widowed, has a history of MDD, fibromyalgia, memory changes was evaluated by telemedicine today.  Patient with psychosocial stressors of job related problems, pandemic, her own health issues.  Patient is currently stable on current medication regimen.  Plan as noted below.  Plan Depression in remission Wellbutrin XL 300 mg p.o. daily-reduced dosage.  GAD-stable Elavil 25 mg p.o. daily. She did not tolerate the higher dosage. Eisbach 10 mg p.o. twice daily Hydroxyzine 20 mg p.o. daily as needed for severe anxiety attacks  Panic attacks-stable Continue CBT Continue BuSpar as prescribed  GeneSight testing results were reviewed while making medication changes.  For cognitive changes - stable, continue to follow-up with psychologist and neurologist.  Follow-up in clinic in 1 to 2 months or sooner if needed.  I have spent atleast 20 minutes face to face by video with patient today. More than 50 % of the time was spent for preparing to see the patient ( e.g., review of test, records ), ordering medications and test ,psychoeducation and supportive psychotherapy and care coordination,as well as documenting clinical information in electronic health record. This note was generated in part or whole with voice recognition software. Voice recognition is usually quite accurate but there are transcription errors that can and very often do  occur. I apologize for any typographical errors that were not detected and corrected.      Ursula Alert, MD 10/19/2020, 7:50 AM

## 2020-11-14 ENCOUNTER — Encounter: Payer: Self-pay | Admitting: Podiatry

## 2020-11-14 ENCOUNTER — Ambulatory Visit (INDEPENDENT_AMBULATORY_CARE_PROVIDER_SITE_OTHER): Payer: BC Managed Care – PPO | Admitting: Podiatry

## 2020-11-14 ENCOUNTER — Other Ambulatory Visit: Payer: Self-pay

## 2020-11-14 DIAGNOSIS — G600 Hereditary motor and sensory neuropathy: Secondary | ICD-10-CM | POA: Diagnosis not present

## 2020-11-14 DIAGNOSIS — E1142 Type 2 diabetes mellitus with diabetic polyneuropathy: Secondary | ICD-10-CM

## 2020-11-14 MED ORDER — GABAPENTIN 100 MG PO CAPS: ORAL_CAPSULE | ORAL | 11 refills | Status: DC

## 2020-11-14 NOTE — Progress Notes (Signed)
She presents today after having seen Dr. Collene Schlichter for nail dystrophy in the past.  She has a history of CMT with neuropathy.  She also has osteoarthritis fibromyalgia.  Currently she is taking amitriptyline and gabapentin for her pain.  Objective: Vital signs are stable alert and oriented x3.  There is no erythema edema cellulitis drainage odor hallux nails are thick due to an extension at the hallux interphalangeal joint and chronic irritation.  She has no skin breakdown she does have some reactive hyperkeratosis due to most likely to forefoot pressures being abnormal on the left side.  But there are no open lesions or wounds.  Assessment: Neuropathy associated with CMT.  Thick dystrophic hallux nails bilateral.  Plan: I increased her gabapentin by 100 mg increasing it to 400 mg 3 times a day one third the maximum dose.  She understands this and is amenable amendable to it.  I also debrided her nails and thickness I recommend that she continue to do so.  If this fails to alleviate her symptoms then we will consider nail avulsion with matrixectomy's.

## 2020-12-08 ENCOUNTER — Other Ambulatory Visit: Payer: Self-pay

## 2020-12-08 ENCOUNTER — Telehealth (INDEPENDENT_AMBULATORY_CARE_PROVIDER_SITE_OTHER): Payer: BC Managed Care – PPO | Admitting: Psychiatry

## 2020-12-08 ENCOUNTER — Encounter: Payer: Self-pay | Admitting: Psychiatry

## 2020-12-08 DIAGNOSIS — F3342 Major depressive disorder, recurrent, in full remission: Secondary | ICD-10-CM

## 2020-12-08 DIAGNOSIS — F411 Generalized anxiety disorder: Secondary | ICD-10-CM | POA: Diagnosis not present

## 2020-12-08 DIAGNOSIS — F5105 Insomnia due to other mental disorder: Secondary | ICD-10-CM | POA: Diagnosis not present

## 2020-12-08 DIAGNOSIS — F410 Panic disorder [episodic paroxysmal anxiety] without agoraphobia: Secondary | ICD-10-CM

## 2020-12-08 NOTE — Progress Notes (Signed)
Virtual Visit via Video Note  I connected with Cynia Abruzzo Cripps on 12/08/20 at  3:40 PM EST by a video enabled telemedicine application and verified that I am speaking with the correct person using two identifiers.  Location Provider Location : [facility] Patient Location : Work  Participants: Patient , Provider   I discussed the limitations of evaluation and management by telemedicine and the availability of in person appointments. The patient expressed understanding and agreed to proceed.    I discussed the assessment and treatment plan with the patient. The patient was provided an opportunity to ask questions and all were answered. The patient agreed with the plan and demonstrated an understanding of the instructions.   The patient was advised to call back or seek an in-person evaluation if the symptoms worsen or if the condition fails to improve as anticipated.   [facility] MD OP Progress Note  12/08/2020 3:53 PM LOUCILE POSNER  MRN:  [id number]  Chief Complaint:  Chief Complaint    Follow-up     HPI: RIYAH BARDON is a 65 year old female, widowed, employed, has a history of MDD, panic attacks, GAD, cognitive disorder currently lives in [place] was evaluated by telemedicine today.  Patient today reports she is currently making progress with regards to her mood symptoms.  Denies any significant depressive symptoms.  She reports she has been feeling motivated to do her job as well as has been able to focus better.  She reports memory problems and concentration as getting better.  Patient reports sleep as good.  She does have anxiety symptoms on and off given the situational stressors of the pandemic.  She reports she was recently exposed to COVID-19 however did not contract it.  Patient reports however overall she is able to cope with her anxiety symptoms better than before.  She continues to follow-up with her therapist on a monthly basis and that is beneficial.  Patient is  compliant on medications and denies side effects.  Patient denies any suicidality, homicidality or perceptual disturbances.  Patient denies any other concerns today.    Visit Diagnosis:    ICD-10-CM   1. MDD (major depressive disorder), recurrent, in full remission ([place])  F33.42   2. Panic attack  F41.0   3. GAD (generalized anxiety disorder)  F41.1   4. Insomnia due to mental condition  F51.05     Past Psychiatric History: I have reviewed past psychiatric history from my progress note on 02/26/2018.  Past Medical History:  Past Medical History:  Diagnosis Date   Anxiety    Arthritis    Bursitis    Rt hip   Complication of anesthesia    nausea   Depression    Family history of adverse reaction to anesthesia    mother, extensive vomiting    Fibromyalgia    GERD (gastroesophageal reflux disease)    Hypercholesteremia    Hypertension    Hypothyroidism    Thyroid disease     Past Surgical History:  Procedure Laterality Date   ABDOMINAL HYSTERECTOMY     CARPAL TUNNEL RELEASE Right 09/01/2020   Procedure: Right carpal tunnel release;  Surgeon: Hessie Knows, MD;  Location: [facility] ORS;  Service: Orthopedics;  Laterality: Right;   CHOLECYSTECTOMY     COLONOSCOPY WITH PROPOFOL N/A 11/13/2016   Procedure: COLONOSCOPY WITH PROPOFOL;  Surgeon: Lollie Sails, MD;  Location: [facility] ENDOSCOPY;  Service: Endoscopy;  Laterality: N/A;   ESOPHAGOGASTRODUODENOSCOPY (EGD) WITH PROPOFOL N/A 11/13/2016   Procedure: ESOPHAGOGASTRODUODENOSCOPY (EGD) WITH  PROPOFOL;  Surgeon: Lollie Sails, MD;  Location: [facility] ENDOSCOPY;  Service: Endoscopy;  Laterality: N/A;   ESOPHAGOGASTRODUODENOSCOPY (EGD) WITH PROPOFOL N/A 12/16/2018   Procedure: ESOPHAGOGASTRODUODENOSCOPY (EGD) WITH PROPOFOL;  Surgeon: Lollie Sails, MD;  Location: [facility] ENDOSCOPY;  Service: Endoscopy;  Laterality: N/A;   LAPAROSCOPIC HYSTERECTOMY Bilateral 02/28/2016   Procedure: HYSTERECTOMY TOTAL LAPAROSCOPIC / BSO;   Surgeon: Honor Loh Ward, MD;  Location: [facility] ORS;  Service: Gynecology;  Laterality: Bilateral;   NECK SURGERY     NISSEN FUNDOPLICATION     TUBAL LIGATION     URETEROSCOPY WITH HOLMIUM LASER LITHOTRIPSY Left 07/03/2016   Procedure: URETEROSCOPY WITH HOLMIUM LASER LITHOTRIPSY;  Surgeon: Royston Cowper, MD;  Location: [facility] ORS;  Service: Urology;  Laterality: Left;    Family Psychiatric History: I have reviewed family psychiatric history from my progress note on 02/26/2018.  Family History:  Family History  Problem Relation Age of Onset   Depression Mother    Anxiety disorder Mother    Breast cancer Neg Hx     Social History: Reviewed social history from my progress note on 02/26/2018. Social History   Socioeconomic History   Marital status: Widowed    Spouse name: Not on file   Number of children: 2   Years of education: Not on file   Highest education level: High school graduate  Occupational History    Comment: fulltime  Tobacco Use   Smoking status: Former Smoker    Start date: 11/14/1974    Quit date: 05/14/2014    Years since quitting: 6.5   Smokeless tobacco: Never Used  Vaping Use   Vaping Use: Every day  Substance and Sexual Activity   Alcohol use: Not Currently    Alcohol/week: 0.0 - 1.0 standard drinks   Drug use: No   Sexual activity: Yes    Partners: Male    Birth control/protection: Condom  Other Topics Concern   Not on file  Social History Narrative   Not on file   Social Determinants of Health   Financial Resource Strain: Not on file  Food Insecurity: Not on file  Transportation Needs: Not on file  Physical Activity: Not on file  Stress: Not on file  Social Connections: Not on file    Allergies:  Allergies  Allergen Reactions   Codeine Nausea Only    Metabolic Disorder Labs: No results found for: HGBA1C, MPG No results found for: PROLACTIN No results found for: CHOL, TRIG, HDL, CHOLHDL, VLDL, LDLCALC No results found  for: TSH  Therapeutic Level Labs: No results found for: LITHIUM No results found for: VALPROATE No components found for:  CBMZ  Current Medications: Current Outpatient Medications  Medication Sig Dispense Refill   HYDROcodone-acetaminophen (NORCO/VICODIN) 5-325 MG tablet Take by mouth.     acetaminophen (TYLENOL) 650 MG CR tablet Take 1,300 mg by mouth every 8 (eight) hours as needed for pain.     amitriptyline (ELAVIL) 25 MG tablet Take 1 tablet (25 mg total) by mouth at bedtime. 90 tablet 0   amoxicillin-clavulanate (AUGMENTIN) 875-125 MG tablet Take 1 tablet by mouth 2 (two) times daily.     azelastine (ASTELIN) 0.1 % nasal spray Place 1 spray into both nostrils 2 (two) times daily as needed for allergies.      buPROPion (WELLBUTRIN XL) 300 MG 24 hr tablet Take 1 tablet (300 mg total) by mouth daily. 90 tablet 1   busPIRone (BUSPAR) 10 MG tablet Take 1 tablet (10 mg total) by mouth 2 (  two) times daily. 180 tablet 0   Calcium Carbonate-Vitamin D 600-400 MG-UNIT tablet Take 1 tablet by mouth 2 (two) times daily.     chlorpheniramine (CHLOR-TRIMETON) 4 MG tablet Take 4 mg by mouth 2 (two) times daily as needed for allergies.     DEXILANT 60 MG capsule Take 60 mg by mouth daily.  2   fluticasone (FLONASE) 50 MCG/ACT nasal spray Place 1 spray into both nostrils daily as needed for allergies.      gabapentin (NEURONTIN) 100 MG capsule Take 4 capsules TID 360 capsule 11   HYDROcodone-acetaminophen (NORCO/VICODIN) 5-325 MG tablet Take 1 tablet by mouth 2 (two) times daily as needed.     hydrOXYzine (ATARAX/VISTARIL) 10 MG tablet TAKE 1-2 TABLETS (10-20 MG TOTAL) BY MOUTH 2 (TWO) TIMES DAILY AS NEEDED (SEVERE ANXIETY SX). (Patient taking differently: Take 10-20 mg by mouth 2 (two) times daily as needed for anxiety. ) 120 tablet 11   ibuprofen (ADVIL) 200 MG tablet Take 400 mg by mouth every 6 (six) hours as needed for headache or moderate pain.     levothyroxine (SYNTHROID) 100 MCG  tablet Take 100 mcg by mouth daily.     levothyroxine (SYNTHROID) 125 MCG tablet Take 125 mcg by mouth daily.     lisinopril-hydrochlorothiazide (PRINZIDE,ZESTORETIC) 10-12.5 MG tablet Take 1 tablet by mouth daily.   2   mupirocin ointment (BACTROBAN) 2 % SMARTSIG:1 Application Topical 2-3 Times Daily     oxyCODONE-acetaminophen (PERCOCET) 5-325 MG tablet Take 1 tablet by mouth every 4 (four) hours as needed for severe pain. 20 tablet 0   PREDNISOLON-MOXIFLOX-NEPAFENAC OP Place 1 drop into the right eye See admin instructions. Instill 1 drop 3 times daily for 1 week then 2 times daily for 1 week then 1 time daily for 1 week then stop     prednisoLONE acetate (PRED FORTE) 1 % ophthalmic suspension SMARTSIG:In Eye(s)     prednisoLONE acetate (PRED FORTE) 1 % ophthalmic suspension      rosuvastatin (CRESTOR) 20 MG tablet Take 20 mg by mouth daily.     Vitamin D, Ergocalciferol, (DRISDOL) 50000 units CAPS capsule Take 50,000 Units by mouth every Monday.   3   No current facility-administered medications for this visit.     Musculoskeletal: Strength & Muscle Tone: UTA Gait & Station: UTA Patient leans: N/A  Psychiatric Specialty Exam: Review of Systems  Psychiatric/Behavioral: The patient is nervous/anxious.   All other systems reviewed and are negative.   There were no vitals taken for this visit.There is no height or weight on file to calculate BMI.  General Appearance: Casual  Eye Contact:  Fair  Speech:  Clear and Coherent  Volume:  Normal  Mood:  Anxious Improving  Affect:  Congruent  Thought Process:  Goal Directed and Descriptions of Associations: Intact  Orientation:  Full (Time, Place, and Person)  Thought Content: Logical   Suicidal Thoughts:  No  Homicidal Thoughts:  No  Memory:  Immediate;   Fair Recent;   Fair Remote;   Fair  Judgement:  Fair  Insight:  Fair  Psychomotor Activity:  Normal  Concentration:  Concentration: Fair and Attention Span: Fair   Recall:  [employer] of Knowledge: Fair  Language: Fair  Akathisia:  No  Handed:  Right  AIMS (if indicated): UTA  Assets:  Communication Skills Desire for Improvement Housing Social Support  ADL's:  Intact  Cognition: WNL  Sleep:  Fair   Screenings:   Assessment and Plan: Collie Siad  Bonesteel is a 65 year old Caucasian female, widowed, has a history of MDD, fibromyalgia, memory changes was evaluated by telemedicine today.  Patient with psychosocial stressors of current pandemic however is currently making progress.  Plan as noted below.  Plan Depression in remission Wellbutrin XL 300 mg p.o. daily-reduced dosage  GAD-stable Elavil 25 mg p.o. daily BuSpar 10 mg p.o. twice daily Hydroxyzine 20 mg p.o. daily as needed for severe anxiety attacks  Panic attacks-stable Continue CBT Continue BuSpar as prescribed  [facility] testing results were reviewed while making medication changes .  Follow-up in clinic in 3 months or sooner if needed.  I have spent atleast 20 minutes face to face by video with patient today. More than 50 % of the time was spent for preparing to see the patient ( e.g., review of test, records ),  ordering medications and test ,psychoeducation and supportive psychotherapy and care coordination,as well as documenting clinical information in electronic health record. This note was generated in part or whole with voice recognition software. Voice recognition is usually quite accurate but there are transcription errors that can and very often do occur. I apologize for any typographical errors that were not detected and corrected.        Ursula Alert, MD 12/09/2020, 1:20 PM

## 2020-12-13 ENCOUNTER — Other Ambulatory Visit: Payer: Self-pay | Admitting: Orthopedic Surgery

## 2020-12-15 ENCOUNTER — Encounter
Admission: RE | Admit: 2020-12-15 | Discharge: 2020-12-15 | Disposition: A | Discharge status: Home / Self Care | Payer: BC Managed Care – PPO | Source: Ambulatory Visit | Attending: Orthopedic Surgery | Admitting: Orthopedic Surgery

## 2020-12-15 ENCOUNTER — Other Ambulatory Visit: Payer: Self-pay

## 2020-12-15 HISTORY — DX: Personal history of urinary calculi: Z87.442

## 2020-12-15 HISTORY — DX: Other specified postprocedural states: Z98.890

## 2020-12-15 HISTORY — DX: Nausea with vomiting, unspecified: R11.2

## 2020-12-15 HISTORY — DX: Sleep apnea, unspecified: G47.30

## 2020-12-15 NOTE — Patient Instructions (Signed)
Your procedure is scheduled on: December 22, 2020 THURSDAY Report to the Registration Desk on the 1st floor of the [facility]. To find out your arrival time, please call [phone] between 1PM - 3PM on: December 21, 2020 [facility]  REMEMBER: Instructions that are not followed completely may result in serious medical risk, up to and including death; or upon the discretion of your surgeon and anesthesiologist your surgery may need to be rescheduled.  Do not eat food after midnight the night before surgery.  No gum chewing, lozengers or hard candies.  You may however, drink CLEAR liquids up to 2 hours before you are scheduled to arrive for your surgery. Do not drink anything within 2 hours of your scheduled arrival time.  Clear liquids include: - water  - apple juice without pulp - gatorade (not RED, PURPLE, OR BLUE) - black coffee or tea (Do NOT add milk or creamers to the coffee or tea) Do NOT drink anything that is not on this list.  Type 1 and Type 2 diabetics should only drink water.  In addition, your doctor has ordered for you to drink the provided  Ensure Pre-Surgery Clear Carbohydrate Drink  Drinking this carbohydrate drink up to two hours before surgery helps to reduce insulin resistance and improve patient outcomes. Please complete drinking 2 hours prior to scheduled arrival time.  TAKE THESE MEDICATIONS THE MORNING OF SURGERY WITH A SIP OF WATER: ROSUVASTATIN BUPROPION CHLOR-TRIMETON GABAPENTIN LEVOTHYROXINE DEXILANT  USE FLONASE  DO NOT TAKE LISINOPRIL-HYDROCHLOROTHIAZIDE MORNING OF SURGERY  One week prior to surgery: Stop Anti-inflammatories (NSAIDS) such as Advil, Aleve, Ibuprofen, Motrin, Naproxen, Naprosyn and ASPIRIN OR Aspirin based products such as Excedrin, Goodys Powder, BC Powder. Stop ANY OVER THE COUNTER supplements until after surgery. (However, you may continue taking Vitamin D, Vitamin B, and multivitamin up until the day before surgery.)  No  Alcohol for 24 hours before or after surgery.  No Smoking including e-cigarettes for 24 hours prior to surgery.  No chewable tobacco products for at least 6 hours prior to surgery.  No nicotine patches on the day of surgery.  Do not use any "recreational" drugs for at least a week prior to your surgery.  Please be advised that the combination of cocaine and anesthesia may have negative outcomes, up to and including death. If you test positive for cocaine, your surgery will be cancelled.  On the morning of surgery brush your teeth with toothpaste and water, you may rinse your mouth with mouthwash if you wish. Do not swallow any toothpaste or mouthwash.  Do not wear jewelry, make-up, hairpins, clips or nail polish.  Do not wear lotions, powders, or perfumes OR DEODORANT   Do not shave body from the neck down 48 hours prior to surgery just in case you cut yourself which could leave a site for infection.  Also, freshly shaved skin may become irritated if using the CHG soap.  Contact lenses, hearing aids and dentures may not be worn into surgery.  Do not bring valuables to the hospital. [facility] is not responsible for any missing/lost belongings or valuables.   Use CHG Soap as directed on instruction sheet.  Notify your doctor if there is any change in your medical condition (cold, fever, infection).  Wear comfortable clothing (specific to your surgery type) to the hospital.  Plan for stool softeners for home use; pain medications have a tendency to cause constipation. You can also help prevent constipation by eating foods high in fiber such  as fruits and vegetables and drinking plenty of fluids as your diet allows.  After surgery, you can help prevent lung complications by doing breathing exercises.  Take deep breaths and cough every 1-2 hours. Your doctor may order a device called an Incentive Spirometer to help you take deep breaths. When coughing or sneezing, hold a pillow firmly  against your incision with both hands. This is called "splinting." Doing this helps protect your incision. It also decreases belly discomfort.  If you are being discharged the day of surgery, you will not be allowed to drive home. You will need a responsible adult (18 years or older) to drive you home and stay with you that night.   Please call the [facility] at [phone] if you have any questions about these instructions.  Visitation Policy:  Patients undergoing a surgery or procedure may have one family member or support person with them as long as that person is not COVID-19 positive or experiencing its symptoms.  That person may remain in the waiting area during the procedure.  Inpatient Visitation:    Visiting hours are 7 a.m. to 8 p.m. Patients will be allowed one visitor. The visitor may change daily. The visitor must pass COVID-19 screenings, use hand sanitizer when entering and exiting the patient's room and wear a mask at all times, including in the patient's room. Patients must also wear a mask when staff or their visitor are in the room. Masking is required regardless of vaccination status. Systemwide, no visitors 17 or younger.

## 2020-12-20 ENCOUNTER — Other Ambulatory Visit: Payer: Self-pay

## 2020-12-20 ENCOUNTER — Other Ambulatory Visit
Admission: RE | Admit: 2020-12-20 | Discharge: 2020-12-20 | Disposition: A | Discharge status: Home / Self Care | Payer: BC Managed Care – PPO | Source: Ambulatory Visit | Attending: Orthopedic Surgery | Admitting: Orthopedic Surgery

## 2020-12-20 DIAGNOSIS — U071 COVID-19: Secondary | ICD-10-CM | POA: Insufficient documentation

## 2020-12-20 DIAGNOSIS — Z8616 Personal history of COVID-19: Secondary | ICD-10-CM

## 2020-12-20 DIAGNOSIS — Z01812 Encounter for preprocedural laboratory examination: Secondary | ICD-10-CM | POA: Diagnosis present

## 2020-12-20 HISTORY — DX: Personal history of COVID-19: Z86.16

## 2020-12-20 LAB — SARS CORONAVIRUS 2 (TAT 6-24 HRS): SARS Coronavirus 2: POSITIVE — AB

## 2020-12-21 ENCOUNTER — Other Ambulatory Visit
Admission: RE | Admit: 2020-12-21 | Discharge: 2020-12-21 | Disposition: A | Discharge status: Home / Self Care | Payer: BC Managed Care – PPO | Source: Ambulatory Visit | Attending: Orthopedic Surgery | Admitting: Orthopedic Surgery

## 2020-12-21 ENCOUNTER — Telehealth: Payer: Self-pay | Admitting: *Deleted

## 2020-12-21 DIAGNOSIS — Z01812 Encounter for preprocedural laboratory examination: Secondary | ICD-10-CM | POA: Insufficient documentation

## 2020-12-21 DIAGNOSIS — U071 COVID-19: Secondary | ICD-10-CM | POA: Diagnosis not present

## 2020-12-21 LAB — SARS CORONAVIRUS 2 BY RT PCR (HOSPITAL ORDER, PERFORMED IN ~~LOC~~ HOSPITAL LAB): SARS Coronavirus 2: POSITIVE — AB

## 2020-12-21 NOTE — Telephone Encounter (Signed)
Called to discuss with patient about COVID-19 symptoms and the use of one of the available treatments for those with mild to moderate Covid symptoms and at a high risk of hospitalization.  Pt appears to qualify for outpatient treatment due to co-morbid conditions and/or a member of an at-risk group in accordance with the FDA Emergency Use Authorization.    Symptom onset: Per chart note today the patient has no symptoms. Vaccinated: Yes Booster? Yes Immunocompromised?  Qualifiers:   Patient is currently having repeat PCR Covid test. No symptoms-does not qualify.  Evelyn Bentley

## 2020-12-21 NOTE — Progress Notes (Signed)
  [facility] Perioperative Services: Pre-Admission/Anesthesia Testing  Abnormal Lab Notification   Date: 12/21/20  Name: Evelyn Bentley MRN:   [id number]  Re: Abnormal labs noted during PAT appointment   Provider(s) Notified: Hessie Knows, MD Notification mode: Routed and/or faxed via [facility]   ABNORMAL LAB VALUE(S): Lab Results  Component Value Date   [place] (A) 12/21/2020   Notes:  Patient is scheduled for a CARPEL TUNNEL RELEASE on 12/22/2020.  Call placed to primary attending surgeon's office; spoke with surgery scheduler Ebony Hail) to make her aware.  Of note, patient was tested yesterday, however she questioned the possibility of a false positive result given the fact that she is asymptomatic.  Repeat testing today was again positive. Results released with note indicating positive result via MyChart.  I indicated that surgeons office would be in contact with her to discuss plans for rescheduling procedure.   This is a Community education officer; no formal response is required.  Honor Loh, MSN, APRN, FNP-C, CEN [facility]  Peri-operative Services Nurse Practitioner Phone: [phone] Fax: [phone] 12/21/20 1:37 PM

## 2021-01-03 ENCOUNTER — Other Ambulatory Visit: Admission: RE | Admit: 2021-01-03 | Payer: BC Managed Care – PPO | Source: Ambulatory Visit

## 2021-01-05 ENCOUNTER — Ambulatory Visit
Admission: RE | Admit: 2021-01-05 | Discharge: 2021-01-05 | Disposition: A | Discharge status: Home / Self Care | Payer: BC Managed Care – PPO | Attending: Orthopedic Surgery | Admitting: Orthopedic Surgery

## 2021-01-05 ENCOUNTER — Other Ambulatory Visit: Payer: Self-pay

## 2021-01-05 ENCOUNTER — Ambulatory Visit: Payer: BC Managed Care – PPO | Admitting: Certified Registered Nurse Anesthetist

## 2021-01-05 ENCOUNTER — Ambulatory Visit: Payer: BC Managed Care – PPO | Admitting: Urgent Care

## 2021-01-05 ENCOUNTER — Encounter: Payer: Self-pay | Admitting: Orthopedic Surgery

## 2021-01-05 ENCOUNTER — Encounter
Admission: RE | Disposition: A | Discharge status: Home / Self Care | Payer: Self-pay | Source: Home / Self Care | Attending: Orthopedic Surgery

## 2021-01-05 DIAGNOSIS — G5622 Lesion of ulnar nerve, left upper limb: Secondary | ICD-10-CM | POA: Insufficient documentation

## 2021-01-05 DIAGNOSIS — Z79899 Other long term (current) drug therapy: Secondary | ICD-10-CM | POA: Insufficient documentation

## 2021-01-05 DIAGNOSIS — G5602 Carpal tunnel syndrome, left upper limb: Secondary | ICD-10-CM | POA: Insufficient documentation

## 2021-01-05 DIAGNOSIS — Z885 Allergy status to narcotic agent status: Secondary | ICD-10-CM | POA: Insufficient documentation

## 2021-01-05 DIAGNOSIS — Z79890 Hormone replacement therapy (postmenopausal): Secondary | ICD-10-CM | POA: Insufficient documentation

## 2021-01-05 HISTORY — PX: ANTERIOR INTEROSSEOUS NERVE DECOMPRESSION: SHX5735

## 2021-01-05 HISTORY — PX: CARPAL TUNNEL RELEASE: SHX101

## 2021-01-05 SURGERY — CARPAL TUNNEL RELEASE
Anesthesia: General | Laterality: Left

## 2021-01-05 MED ORDER — CHLORHEXIDINE GLUCONATE 0.12 % MT SOLN
OROMUCOSAL | Status: AC
  Administered 2021-01-05: 15 mL via OROMUCOSAL
  Filled 2021-01-05: qty 15

## 2021-01-05 MED ORDER — PROPOFOL 500 MG/50ML IV EMUL
INTRAVENOUS | Status: AC
  Filled 2021-01-05: qty 50

## 2021-01-05 MED ORDER — FENTANYL CITRATE (PF) 100 MCG/2ML IJ SOLN
INTRAMUSCULAR | Status: AC
  Administered 2021-01-05: 50 ug via INTRAVENOUS
  Filled 2021-01-05: qty 2

## 2021-01-05 MED ORDER — SODIUM CHLORIDE 0.9 % IV SOLN: INTRAVENOUS | Status: DC

## 2021-01-05 MED ORDER — ONDANSETRON HCL 4 MG/2ML IJ SOLN
INTRAMUSCULAR | Status: DC | PRN
  Administered 2021-01-05: 4 mg via INTRAVENOUS

## 2021-01-05 MED ORDER — OXYCODONE HCL 5 MG PO TABS
5.0000 mg | ORAL_TABLET | Freq: Once | ORAL | Status: AC
  Administered 2021-01-05: 5 mg via ORAL

## 2021-01-05 MED ORDER — DEXAMETHASONE SODIUM PHOSPHATE 10 MG/ML IJ SOLN
INTRAMUSCULAR | Status: DC | PRN
  Administered 2021-01-05: 10 mg via INTRAVENOUS

## 2021-01-05 MED ORDER — MEPERIDINE HCL 50 MG/ML IJ SOLN
INTRAMUSCULAR | Status: AC
  Administered 2021-01-05: 12.5 mg via INTRAVENOUS
  Filled 2021-01-05: qty 1

## 2021-01-05 MED ORDER — ONDANSETRON HCL 4 MG PO TABS: 4.0000 mg | ORAL_TABLET | Freq: Four times a day (QID) | ORAL | Status: DC | PRN

## 2021-01-05 MED ORDER — FENTANYL CITRATE (PF) 100 MCG/2ML IJ SOLN
INTRAMUSCULAR | Status: AC
  Filled 2021-01-05: qty 2

## 2021-01-05 MED ORDER — ACETAMINOPHEN 10 MG/ML IV SOLN
INTRAVENOUS | Status: AC
  Filled 2021-01-05: qty 100

## 2021-01-05 MED ORDER — FENTANYL CITRATE (PF) 100 MCG/2ML IJ SOLN
INTRAMUSCULAR | Status: DC | PRN
  Administered 2021-01-05: 50 ug via INTRAVENOUS
  Administered 2021-01-05 (×2): 25 ug via INTRAVENOUS

## 2021-01-05 MED ORDER — PROMETHAZINE HCL 25 MG/ML IJ SOLN: 6.2500 mg | INTRAMUSCULAR | Status: DC | PRN

## 2021-01-05 MED ORDER — ORAL CARE MOUTH RINSE: 15.0000 mL | Freq: Once | OROMUCOSAL | Status: AC

## 2021-01-05 MED ORDER — METOCLOPRAMIDE HCL 5 MG/ML IJ SOLN: 5.0000 mg | Freq: Three times a day (TID) | INTRAMUSCULAR | Status: DC | PRN

## 2021-01-05 MED ORDER — ACETAMINOPHEN 10 MG/ML IV SOLN
INTRAVENOUS | Status: DC | PRN
  Administered 2021-01-05: 1000 mg via INTRAVENOUS

## 2021-01-05 MED ORDER — FENTANYL CITRATE (PF) 100 MCG/2ML IJ SOLN
25.0000 ug | INTRAMUSCULAR | Status: AC | PRN
  Administered 2021-01-05: 25 ug via INTRAVENOUS
  Administered 2021-01-05: 50 ug via INTRAVENOUS
  Administered 2021-01-05 (×2): 25 ug via INTRAVENOUS

## 2021-01-05 MED ORDER — BUPIVACAINE HCL 0.5 % IJ SOLN
INTRAMUSCULAR | Status: DC | PRN
  Administered 2021-01-05: 10 mL

## 2021-01-05 MED ORDER — CHLORHEXIDINE GLUCONATE 0.12 % MT SOLN: 15.0000 mL | Freq: Once | OROMUCOSAL | Status: AC

## 2021-01-05 MED ORDER — METOCLOPRAMIDE HCL 10 MG PO TABS: 5.0000 mg | ORAL_TABLET | Freq: Three times a day (TID) | ORAL | Status: DC | PRN

## 2021-01-05 MED ORDER — MEPERIDINE HCL 50 MG/ML IJ SOLN
6.2500 mg | INTRAMUSCULAR | Status: DC | PRN
  Administered 2021-01-05: 6.25 mg via INTRAVENOUS

## 2021-01-05 MED ORDER — CEFAZOLIN SODIUM-DEXTROSE 2-4 GM/100ML-% IV SOLN
2.0000 g | INTRAVENOUS | Status: AC
  Administered 2021-01-05: 2 g via INTRAVENOUS

## 2021-01-05 MED ORDER — CEFAZOLIN SODIUM-DEXTROSE 2-4 GM/100ML-% IV SOLN
INTRAVENOUS | Status: AC
  Filled 2021-01-05: qty 100

## 2021-01-05 MED ORDER — LIDOCAINE HCL (CARDIAC) PF 100 MG/5ML IV SOSY
PREFILLED_SYRINGE | INTRAVENOUS | Status: DC | PRN
  Administered 2021-01-05: 80 mg via INTRAVENOUS

## 2021-01-05 MED ORDER — FENTANYL CITRATE (PF) 100 MCG/2ML IJ SOLN
INTRAMUSCULAR | Status: AC
  Administered 2021-01-05: 25 ug via INTRAVENOUS
  Filled 2021-01-05: qty 2

## 2021-01-05 MED ORDER — OXYCODONE HCL 5 MG PO TABS
ORAL_TABLET | ORAL | Status: AC
  Filled 2021-01-05: qty 1

## 2021-01-05 MED ORDER — PROPOFOL 10 MG/ML IV BOLUS
INTRAVENOUS | Status: AC
  Filled 2021-01-05: qty 20

## 2021-01-05 MED ORDER — LACTATED RINGERS IV SOLN: INTRAVENOUS | Status: DC

## 2021-01-05 MED ORDER — PROPOFOL 10 MG/ML IV BOLUS
INTRAVENOUS | Status: DC | PRN
  Administered 2021-01-05: 150 mg via INTRAVENOUS

## 2021-01-05 MED ORDER — OXYCODONE-ACETAMINOPHEN 5-325 MG PO TABS: 1.0000 | ORAL_TABLET | ORAL | 0 refills | Status: AC | PRN

## 2021-01-05 MED ORDER — ONDANSETRON HCL 4 MG/2ML IJ SOLN: 4.0000 mg | Freq: Four times a day (QID) | INTRAMUSCULAR | Status: DC | PRN

## 2021-01-05 SURGICAL SUPPLY — 26 items
APL PRP STRL LF DISP 70% ISPRP (MISCELLANEOUS) ×1
BNDG ELASTIC 3X5.8 VLCR STR LF (GAUZE/BANDAGES/DRESSINGS) ×2 IMPLANT
CANISTER SUCT 1200ML W/VALVE (MISCELLANEOUS) ×2 IMPLANT
CHLORAPREP W/TINT 26 (MISCELLANEOUS) ×2 IMPLANT
COVER WAND RF STERILE (DRAPES) ×2 IMPLANT
CUFF TOURN SGL QUICK 18X4 (TOURNIQUET CUFF) IMPLANT
ELECT CAUTERY NDL 2.0 MIC (NEEDLE) IMPLANT
ELECT CAUTERY NEEDLE 2.0 MIC (NEEDLE) IMPLANT
GAUZE SPONGE 4X4 12PLY STRL (GAUZE/BANDAGES/DRESSINGS) ×2 IMPLANT
GAUZE XEROFORM 1X8 LF (GAUZE/BANDAGES/DRESSINGS) ×2 IMPLANT
GLOVE SURG SYN 9.0  PF PI (GLOVE) ×2
GLOVE SURG SYN 9.0 PF PI (GLOVE) ×1 IMPLANT
GOWN SRG 2XL LVL 4 RGLN SLV (GOWNS) ×1 IMPLANT
GOWN STRL NON-REIN 2XL LVL4 (GOWNS) ×2
GOWN STRL REUS W/ TWL LRG LVL3 (GOWN DISPOSABLE) ×1 IMPLANT
GOWN STRL REUS W/TWL LRG LVL3 (GOWN DISPOSABLE) ×2
KIT TURNOVER KIT A (KITS) ×2 IMPLANT
MANIFOLD NEPTUNE II (INSTRUMENTS) ×2 IMPLANT
NS IRRIG 500ML POUR BTL (IV SOLUTION) ×2 IMPLANT
PACK EXTREMITY ARMC (MISCELLANEOUS) ×2 IMPLANT
PAD CAST CTTN 4X4 STRL (SOFTGOODS) ×1 IMPLANT
PADDING CAST COTTON 4X4 STRL (SOFTGOODS) ×2
SCALPEL PROTECTED #15 DISP (BLADE) ×4 IMPLANT
SUT ETHILON 4-0 (SUTURE) ×4
SUT ETHILON 4-0 FS2 18XMFL BLK (SUTURE) ×2
SUTURE ETHLN 4-0 FS2 18XMF BLK (SUTURE) ×1 IMPLANT

## 2021-01-05 NOTE — Anesthesia Procedure Notes (Signed)
Procedure Name: LMA Insertion Date/Time: 01/05/2021 11:09 AM Performed by: Lowry Bowl, CRNA Pre-anesthesia Checklist: Patient identified, Emergency Drugs available, Suction available and Patient being monitored Patient Re-evaluated:Patient Re-evaluated prior to induction Oxygen Delivery Method: Circle system utilized Preoxygenation: Pre-oxygenation with 100% oxygen Induction Type: IV induction LMA: LMA inserted LMA Size: 3.5 Number of attempts: 1 Placement Confirmation: positive ETCO2 and breath sounds checked- equal and bilateral Tube secured with: Tape Dental Injury: Teeth and Oropharynx as per pre-operative assessment

## 2021-01-05 NOTE — Anesthesia Postprocedure Evaluation (Signed)
Anesthesia Post Note  Patient: [name]  Procedure(s) Performed: Carpal tunnel release, left (Left ) Cubital tunnel release, left (Left )  Patient location during evaluation: PACU Anesthesia Type: General Level of consciousness: awake and alert Pain management: pain level controlled Vital Signs Assessment: post-procedure vital signs reviewed and stable Respiratory status: spontaneous breathing, nonlabored ventilation, respiratory function stable and patient connected to nasal cannula oxygen Cardiovascular status: blood pressure returned to baseline and stable Postop Assessment: no apparent nausea or vomiting Anesthetic complications: no   No complications documented.   Last Vitals:  Vitals:   01/05/21 1245 01/05/21 1300  BP: (!) 131/91 140/75  Pulse: 85 93  Resp: 20 17  Temp:  (!) 36.4 C  SpO2: 97% 97%    Last Pain:  Vitals:   01/05/21 1245  TempSrc:   PainSc: 5                  Precious Haws Tisha Cline

## 2021-01-05 NOTE — H&P (Signed)
Chief Complaint  Patient presents with  . Pre-op Exam  scheduled for Carpal tunnel release, left   Evelyn Bentley is a 65 y.o. female who presents today for history and physical for left cubital tunnel and carpal tunnel release with Dr. Hessie Knows on 12/22/2020. She has underwent successful right carpal tunnel with cubital tunnel release back in October 2021 is doing very well. 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 She has had persistent carpal tunnel and cubital tunnel symptoms in the left upper extremity x6 years that has failed treatment with bracing. She has nighttime awakening in the left hand median nerve distribution. She has some occasional pain along the ulnar aspect of the elbow with numbness and tingling in the ulnar nerve distribution. She has numbness and tingling throughout the day in the left hand in the median nerve distribution. Occasionally she will feel as if the left hand is weak.  Past Medical History: Past Medical History:  Diagnosis Date  . Arthritis  . Bursitis  . Chronic gastritis 11/13/2016  . Depression  . Diverticulosis 11/13/2016  . Duodenitis 11/13/2016  . Erosive esophagitis 11/13/2016  . Gastric ulcer without hemorrhage or perforation 11/13/2016  . Hyperplastic colon polyp 11/13/2016  . Hypertension  . Hypothyroid, unspecified  . Migraine  . Tubular adenoma of colon, unspecified 11/13/2016   Past Surgical History: Past Surgical History:  Procedure Laterality Date  . 2 neck surgeries  . CHOLECYSTECTOMY  . COLONOSCOPY 11/13/2016  Tubular adenoma of colon/Hyperplastic colon polyp/Diverticulosis/Repeat 52yrs/MUS  . CUBITAL TUNNEL RELEASE Right 09/01/2020  Dr. Rudene Christians  . EGD 11/13/2016  Chronic gastritis/Duodenitis/Non-bleeding gastric ulcer/Erosive esophagitis/No Repeat/MUS  . EGD 12/16/2018  GERD/No Repeat/MUS  . ENDOSCOPIC CARPAL TUNNEL RELEASE Right 09/01/2020   Dr. Rudene Christians  . EYE SURGERY Bilateral 2021  cataract surgery  . FUNDOPLASTY TRANSTHORACIC   Past Family History: Family History  Problem Relation Age of Onset  . Thyroid disease Mother  . Diabetes Mother  . Gallbladder disease Mother  . Neck pain Mother  . Arthritis Mother  . Fibromyalgia Mother  . No Known Problems Father  . Arthritis Maternal Grandmother  . Diabetes Maternal Grandmother  . No Known Problems Maternal Grandfather  . No Known Problems Paternal Grandmother  . No Known Problems Paternal Grandfather  . Charcot-Marie-Tooth disease Daughter  . Arthritis Paternal Aunt  . Colon cancer Neg Hx  . Colon polyps Neg Hx  . Liver disease Neg Hx  . Rectal cancer Neg Hx  . Ulcers Neg Hx   Medications: Current Outpatient Medications Ordered in Epic  Medication Sig Dispense Refill  . acetaminophen (TYLENOL) 650 MG ER tablet Take by mouth  . ALLERGY RELIEF-D,FEXOFENADINE, 60-120 mg ER tablet Take 1 tablet by mouth once daily  . amitriptyline (ELAVIL) 25 MG tablet Take 1 tablet by mouth nightly At bed time  . azelastine (ASTELIN) 137 mcg nasal spray Place into one nostril as needed  . buPROPion (WELLBUTRIN XL) 300 MG XL tablet Take by mouth  . busPIRone (BUSPAR) 10 MG tablet TAKE 1 TABLET BY MOUTH TWICE A DAY  . calcium carbonate-vit D3-min 600 mg calcium- 400 unit Tab Take by mouth  . dexlansoprazole (DEXILANT) 60 mg DR capsule Take 1 capsule by mouth once daily  . ergocalciferol, vitamin D2, 50,000 unit capsule Take 1 capsule (50,000 Units total) by mouth twice a week. (Patient taking differently: Take 50,000 Units by mouth once a week.  ) 8 capsule 3  .  fluticasone (FLONASE) 50 mcg/actuation nasal spray Place 2 sprays into both nostrils once daily.  Marland Kitchen gabapentin (NEURONTIN) 300 MG capsule Take 1 capsule (300 mg total) by mouth 2 (two) times daily 180 capsule 1  . HYDROcodone-acetaminophen (NORCO) 5-325 mg tablet Take 1 tablet by mouth 2 (two) times daily as needed 30 tablet 0   . hydrOXYzine HCl (ATARAX) 10 MG tablet Take 1 tablet by mouth 2 (two) times daily as needed  . ibuprofen (ADVIL,MOTRIN) 600 MG tablet Take 1 tablet by mouth every 6 (six) hours as needed 3  . levothyroxine (SYNTHROID) 125 MCG tablet Take 125 mcg by mouth once daily  . lisinopril-hydrochlorothiazide (PRINZIDE,ZESTORETIC) 10-12.5 mg tablet Take 1 tablet by mouth once daily.  . naltrexone HCl (NALTREXONE ORAL) Take 2.5 mg by mouth once daily  . rosuvastatin (CRESTOR) 10 MG tablet Take 1 tablet by mouth once daily  . VENTOLIN HFA 90 mcg/actuation inhaler TAKE 1 TABLET BY MOUTH EVERY 4 TO 6 HOURS AS NEEDED   No current Epic-ordered facility-administered medications on file.   Allergies: Allergies  Allergen Reactions  . Codeine Nausea and Vomiting    Review of Systems:  A comprehensive 14 point ROS was performed, reviewed by me today, and the pertinent orthopaedic findings are documented in the HPI.  Exam: BP (!) 142/82  Ht 152.4 cm (5')  Wt 74.9 kg (165 lb 3.2 oz)  BMI 32.26 kg/m  General:  Well developed, well nourished, no apparent distress, normal affect, normal gait with no antalgic component.   HEENT: Head normocephalic, atraumatic, PERRL.   Abdomen: Soft, non tender, non distended, Bowel sounds present.  Heart: Examination of the heart reveals regular, rate, and rhythm. There is no murmur noted on ascultation. There is a normal apical pulse.  Lungs: Lungs are clear to auscultation. There is no wheeze, rhonchi, or crackles. There is normal expansion of bilateral chest walls.  Collected  Examination of the left upper extremity shows tenderness along the left elbow cubital tunnel with positive Tinel's. She has normal sensation throughout the forearm. Slight decrease sensation in median nerve distribution. She has full composite fist. Positive Tinel's and Phalen sign. Very mild thenar atrophy. 2+ radial pulse 2+ cap refill.  Nerve conduction studies from October 2020 This  is an abnormal study. There is an electrodiagnostic and sonographic evidence of bilateral median neuropathies at the wrists (i.e carpal tunnel syndrome) and electrodiagnostic and sonographic evidence of bilateral ulnar neuropathies at the elbows (i.e cubital tunnel syndrome).   Impression: Left carpal tunnel syndrome [G56.02] Left carpal tunnel syndrome (primary encounter diagnosis) Cubital tunnel syndrome on left  Plan:  1. Risks, benefits, complications of a left carpal tunnel and left cubital tunnel release have been discussed with the patient. Patient has agreed and consented to procedure with Dr. Hessie Knows on 12/22/2020..  This note was generated in part with voice recognition software and I apologize for any typographical errors that were not detected and corrected.  Feliberto Gottron MPA-C    Electronically signed by Feliberto Gottron, PA at 12/19/2020 3:48 PM EST    Reviewed  H+P. No changes noted.

## 2021-01-05 NOTE — Anesthesia Preprocedure Evaluation (Signed)
Anesthesia Evaluation  Patient identified by MRN, date of birth, ID band Patient awake    Reviewed: Allergy & Precautions, H&P , NPO status , Patient's Chart, lab work & pertinent test results  History of Anesthesia Complications (+) PONV, Family history of anesthesia reaction and history of anesthetic complications  Airway Mallampati: III  TM Distance: <3 FB Neck ROM: full    Dental  (+) Chipped   Pulmonary neg shortness of breath, sleep apnea , former smoker,    Pulmonary exam normal        Cardiovascular Exercise Tolerance: Good hypertension, (-) angina(-) Past MI and (-) DOE Normal cardiovascular exam     Neuro/Psych PSYCHIATRIC DISORDERS  Neuromuscular disease    GI/Hepatic Neg liver ROS, GERD  Medicated and Controlled,  Endo/Other  Hypothyroidism   Renal/GU Renal disease     Musculoskeletal  (+) Arthritis , Fibromyalgia -  Abdominal   Peds  Hematology negative hematology ROS (+)   Anesthesia Other Findings Past Medical History: No date: Anxiety No date: Arthritis No date: Bursitis     Comment:  Rt hip No date: Complication of anesthesia     Comment:  nausea, BLOOD PRESSURE DROPPED  08/2020 No date: Depression No date: Family history of adverse reaction to anesthesia     Comment:  mother, extensive vomiting  No date: Fibromyalgia No date: GERD (gastroesophageal reflux disease) No date: History of kidney stones No date: Hypercholesteremia No date: Hypertension No date: Hypothyroidism No date: PONV (postoperative nausea and vomiting) No date: Sleep apnea     Comment:  MILD- DOESN'T NEED cpap No date: Thyroid disease  Past Surgical History: No date: ABDOMINAL HYSTERECTOMY 09/01/2020: CARPAL TUNNEL RELEASE; Right     Comment:  Procedure: Right carpal tunnel release;  Surgeon: Hessie Knows, MD;  Location: [facility] ORS;  Service: Orthopedics;               Laterality: Right; No date:  CATARACT EXTRACTION, BILATERAL No date: CHOLECYSTECTOMY 11/13/2016: COLONOSCOPY WITH PROPOFOL; N/A     Comment:  Procedure: COLONOSCOPY WITH PROPOFOL;  Surgeon: Lollie Sails, MD;  Location: [facility] ENDOSCOPY;  Service:               Endoscopy;  Laterality: N/A; 11/13/2016: ESOPHAGOGASTRODUODENOSCOPY (EGD) WITH PROPOFOL; N/A     Comment:  Procedure: ESOPHAGOGASTRODUODENOSCOPY (EGD) WITH               PROPOFOL;  Surgeon: Lollie Sails, MD;  Location:               [facility] ENDOSCOPY;  Service: Endoscopy;  Laterality: N/A; 12/16/2018: ESOPHAGOGASTRODUODENOSCOPY (EGD) WITH PROPOFOL; N/A     Comment:  Procedure: ESOPHAGOGASTRODUODENOSCOPY (EGD) WITH               PROPOFOL;  Surgeon: Lollie Sails, MD;  Location:               [facility] ENDOSCOPY;  Service: Endoscopy;  Laterality: N/A; 02/28/2016: LAPAROSCOPIC HYSTERECTOMY; Bilateral     Comment:  Procedure: HYSTERECTOMY TOTAL LAPAROSCOPIC / BSO;                Surgeon: Honor Loh Ward, MD;  Location: [facility] ORS;                Service: Gynecology;  Laterality: Bilateral; No date: NECK SURGERY     Comment:  X 2 No date: NISSEN FUNDOPLICATION No date: TUBAL LIGATION 07/03/2016: URETEROSCOPY WITH HOLMIUM LASER LITHOTRIPSY; Left     Comment:  Procedure: URETEROSCOPY WITH HOLMIUM LASER LITHOTRIPSY;               Surgeon: Royston Cowper, MD;  Location: [facility] ORS;                Service: Urology;  Laterality: Left;  BMI    Body Mass Index: 33.20 kg/m      Reproductive/Obstetrics negative OB ROS                             Anesthesia Physical Anesthesia Plan  ASA: III  Anesthesia Plan: General LMA   Post-op Pain Management:    Induction: Intravenous  PONV Risk Score and Plan: Dexamethasone, Ondansetron, Midazolam and Treatment may vary due to age or medical condition  Airway Management Planned: LMA  Additional Equipment:   Intra-op Plan:   Post-operative Plan: Extubation in OR  Informed Consent:  I have reviewed the patients History and Physical, chart, labs and discussed the procedure including the risks, benefits and alternatives for the proposed anesthesia with the patient or authorized representative who has indicated his/her understanding and acceptance.     Dental Advisory Given  Plan Discussed with: Anesthesiologist, CRNA and Surgeon  Anesthesia Plan Comments: (Patient consented for risks of anesthesia including but not limited to:  - adverse reactions to medications - damage to eyes, teeth, lips or other oral mucosa - nerve damage due to positioning  - sore throat or hoarseness - Damage to heart, brain, nerves, lungs, other parts of body or loss of life  Patient voiced understanding.)        Anesthesia Quick Evaluation

## 2021-01-05 NOTE — Transfer of Care (Signed)
Immediate Anesthesia Transfer of Care Note  Patient: Shakari Qazi Fitzhenry  Procedure(s) Performed: Carpal tunnel release, left (Left ) Cubital tunnel release, left (Left )  Patient Location: PACU  Anesthesia Type:General  Level of Consciousness: awake, alert  and oriented  Airway & Oxygen Therapy: Patient Spontanous Breathing and Patient connected to face mask oxygen  Post-op Assessment: Report given to RN and Post -op Vital signs reviewed and stable  Post vital signs: Reviewed and stable  Last Vitals:  Vitals Value Taken Time  BP 163/84 01/05/21 1203  Temp 36.2 C 01/05/21 1203  Pulse 80 01/05/21 1203  Resp 18 01/05/21 1203  SpO2 100 % 01/05/21 1203    Last Pain:  Vitals:   01/05/21 0918  TempSrc: Temporal  PainSc: 0-No pain         Complications: No complications documented.

## 2021-01-05 NOTE — Discharge Instructions (Signed)
Keep arm elevated through the weekend.  Work on finger and elbow range of motion as tolerated. Pain medicine as directed. Loosen Ace wrap if fingers swell.  Leave underlying cotton roll alone. Call office if you are having problems.

## 2021-01-05 NOTE — Op Note (Signed)
01/05/2021  12:00 PM  PATIENT:  Evelyn Bentley  65 y.o. female  PRE-OPERATIVE DIAGNOSIS:  Left carpal tunnel syndrome G56.02 Cubital tunnel syndrome on left G56.22  POST-OPERATIVE DIAGNOSIS:  Left carpal tunnel syndrome G56.02  PROCEDURE:  Procedure(s): Carpal tunnel release, left (Left) Cubital tunnel release, left (Left)  SURGEON: Laurene Footman, MD  ASSISTANTS: None  ANESTHESIA:   general  EBL:  Total I/O In: 700 [I.V.:500; IV Piggyback:200] Out: 2 [Blood:2]  BLOOD ADMINISTERED:none  DRAINS: none   LOCAL MEDICATIONS USED:  MARCAINE     SPECIMEN:  No Specimen  DISPOSITION OF SPECIMEN:  N/A  COUNTS:  YES  TOURNIQUET:   Total Tourniquet Time Documented: Upper Arm (Left) - 29 minutes Total: Upper Arm (Left) - 29 minutes   IMPLANTS: None  DICTATION: .Dragon Dictation  patient was brought to the operating room and after adequate general anesthesia was obtained the left arm was prepped and draped in the usual sterile fashion with a tourniquet applied the upper arm.  After patient identification and timeout procedure was carried out, tourniquet was raised.  Incision was made just posterior to the medial epicondyle between the medial epicondyle and the olecranon.  Skin and subcutaneous tissue spread preserving cutaneous nerves much as possible.  The ulnar nerve could be palpated and the was unroofed and opened proximally then going distally the nerve was noted to have apparent compression at the level of the medial epicondyle distally the nerve appeared normal and was identified and visualized to the muscle when it penetrated distally.  Going proximally there did not appear to be compression beyond about 2 cm proximal to the medial epicondyle with release no further more proximal decompression was required.  The wound is irrigated and then closed with simple erupted 3-0 nylon skin sutures.   A 2 cm incision made in line with the ring metacarpal the skin and subcutaneous tissue  spread transverse carpal ligament identified with some aberrant thenar musculature overlying the ligament this was elevated radially.  The ligament was then opened and a vascular hemostat placed deep to protect the underlying structures with release carried out distally until fat was noted around the nerve and then proximally to about 2 cm proximal to the wrist flexion crease there appeared to be an area of compression in the mid carpal tunnel approximately a centimeter in length where the nerve was initially blanched but after release there is good vascular blush there is mild flexor tenosynovitis present and no masses noted.  The wound was infiltrated with 10 cc of half percent Sensorcaine closed with simple interrupted nylon skin sutures Xeroform 4 x 4's web roll and Ace wrap applied to both incisions.  Tourniquet let down at the close of the case.  PLAN OF CARE: Discharge to home after PACU  PATIENT DISPOSITION:  PACU - hemodynamically stable.

## 2021-01-06 ENCOUNTER — Encounter: Payer: Self-pay | Admitting: Orthopedic Surgery

## 2021-01-18 ENCOUNTER — Ambulatory Visit: Payer: BC Managed Care – PPO | Admitting: Podiatry

## 2021-02-14 ENCOUNTER — Other Ambulatory Visit: Payer: Self-pay | Admitting: Psychiatry

## 2021-02-14 ENCOUNTER — Other Ambulatory Visit: Payer: Self-pay

## 2021-02-14 ENCOUNTER — Telehealth (INDEPENDENT_AMBULATORY_CARE_PROVIDER_SITE_OTHER): Payer: BC Managed Care – PPO | Admitting: Psychiatry

## 2021-02-14 ENCOUNTER — Encounter: Payer: Self-pay | Admitting: Psychiatry

## 2021-02-14 DIAGNOSIS — F411 Generalized anxiety disorder: Secondary | ICD-10-CM | POA: Diagnosis not present

## 2021-02-14 DIAGNOSIS — F3342 Major depressive disorder, recurrent, in full remission: Secondary | ICD-10-CM

## 2021-02-14 DIAGNOSIS — F5105 Insomnia due to other mental disorder: Secondary | ICD-10-CM | POA: Diagnosis not present

## 2021-02-14 DIAGNOSIS — F410 Panic disorder [episodic paroxysmal anxiety] without agoraphobia: Secondary | ICD-10-CM | POA: Diagnosis not present

## 2021-02-14 MED ORDER — AMITRIPTYLINE HCL 25 MG PO TABS: 25.0000 mg | ORAL_TABLET | Freq: Every day | ORAL | 0 refills | Status: DC

## 2021-02-14 MED ORDER — BUSPIRONE HCL 10 MG PO TABS: 10.0000 mg | ORAL_TABLET | Freq: Every day | ORAL | 0 refills | Status: DC | PRN

## 2021-02-14 NOTE — Progress Notes (Signed)
Virtual Visit via Video Note  I connected with Evelyn Bentley on 02/14/21 at  4:20 PM EDT by a video enabled telemedicine application and verified that I am speaking with the correct person using two identifiers.  Location Provider Location : [facility] Patient Location : Car  Participants: Patient , Provider   I discussed the limitations of evaluation and management by telemedicine and the availability of in person appointments. The patient expressed understanding and agreed to proceed.   I discussed the assessment and treatment plan with the patient. The patient was provided an opportunity to ask questions and all were answered. The patient agreed with the plan and demonstrated an understanding of the instructions.   The patient was advised to call back or seek an in-person evaluation if the symptoms worsen or if the condition fails to improve as anticipated.   [place] MD OP Progress Note  02/14/2021 4:40 PM Evelyn Bentley  MRN:  [id number]  Chief Complaint:  Chief Complaint    Follow-up; Anxiety     HPI: Evelyn Bentley is a 65 year old female, widowed, employed, has a history of MDD, panic attacks, cognitive disorder, GAD, currently lives in [place] was evaluated by telemedicine today.  Patient today reports she is currently doing well.  Denies any significant depression or anxiety symptoms.  She reports she does not feel sad.  She reports her concentration is good.  She has been able to function at work.  She has not been getting lost like she used to before.  She has not had any memory problems at all.  She is able to better cope with anxiety symptoms.  Does not worry like she used to before.  She reports sleep is good.  She reports work is stressful however she has been coping okay.  Patient denies any suicidality, homicidality or perceptual disturbances.  She has been taking the BuSpar only as needed and wants to stay on it as needed.  She is compliant on her other medications.   Denies side effects.  Patient denies any other concerns today.  Visit Diagnosis:    ICD-10-CM   1. MDD (major depressive disorder), recurrent, in full remission ([place])  F33.42   2. Panic attack  F41.0 amitriptyline (ELAVIL) 25 MG tablet    busPIRone (BUSPAR) 10 MG tablet  3. GAD (generalized anxiety disorder)  F41.1 amitriptyline (ELAVIL) 25 MG tablet    busPIRone (BUSPAR) 10 MG tablet  4. Insomnia due to mental condition  F51.05     Past Psychiatric History: I have reviewed past psychiatric history from my progress note on 02/26/2018.  Past Medical History:  Past Medical History:  Diagnosis Date  . Anxiety   . Arthritis   . Bursitis    Rt hip  . Complication of anesthesia    nausea, BLOOD PRESSURE DROPPED  08/2020  . Depression   . Family history of adverse reaction to anesthesia    mother, extensive vomiting   . Fibromyalgia   . GERD (gastroesophageal reflux disease)   . History of kidney stones   . Hypercholesteremia   . Hypertension   . Hypothyroidism   . PONV (postoperative nausea and vomiting)   . Sleep apnea    MILD- DOESN'T NEED cpap  . Thyroid disease     Past Surgical History:  Procedure Laterality Date  . ABDOMINAL HYSTERECTOMY    . ANTERIOR INTEROSSEOUS NERVE DECOMPRESSION Left 01/05/2021   Procedure: Cubital tunnel release, left;  Surgeon: Hessie Knows, MD;  Location: [facility] ORS;  Service: Orthopedics;  Laterality: Left;  . CARPAL TUNNEL RELEASE Right 09/01/2020   Procedure: Right carpal tunnel release;  Surgeon: Hessie Knows, MD;  Location: [facility] ORS;  Service: Orthopedics;  Laterality: Right;  . CARPAL TUNNEL RELEASE Left 01/05/2021   Procedure: Carpal tunnel release, left;  Surgeon: Hessie Knows, MD;  Location: [facility] ORS;  Service: Orthopedics;  Laterality: Left;  . CATARACT EXTRACTION, BILATERAL    . CHOLECYSTECTOMY    . COLONOSCOPY WITH PROPOFOL N/A 11/13/2016   Procedure: COLONOSCOPY WITH PROPOFOL;  Surgeon: Lollie Sails, MD;  Location: [facility]  ENDOSCOPY;  Service: Endoscopy;  Laterality: N/A;  . ESOPHAGOGASTRODUODENOSCOPY (EGD) WITH PROPOFOL N/A 11/13/2016   Procedure: ESOPHAGOGASTRODUODENOSCOPY (EGD) WITH PROPOFOL;  Surgeon: Lollie Sails, MD;  Location: [facility] ENDOSCOPY;  Service: Endoscopy;  Laterality: N/A;  . ESOPHAGOGASTRODUODENOSCOPY (EGD) WITH PROPOFOL N/A 12/16/2018   Procedure: ESOPHAGOGASTRODUODENOSCOPY (EGD) WITH PROPOFOL;  Surgeon: Lollie Sails, MD;  Location: [facility] ENDOSCOPY;  Service: Endoscopy;  Laterality: N/A;  . LAPAROSCOPIC HYSTERECTOMY Bilateral 02/28/2016   Procedure: HYSTERECTOMY TOTAL LAPAROSCOPIC / BSO;  Surgeon: Honor Loh Ward, MD;  Location: [facility] ORS;  Service: Gynecology;  Laterality: Bilateral;  . NECK SURGERY     X 2  . NISSEN FUNDOPLICATION    . TUBAL LIGATION    . URETEROSCOPY WITH HOLMIUM LASER LITHOTRIPSY Left 07/03/2016   Procedure: URETEROSCOPY WITH HOLMIUM LASER LITHOTRIPSY;  Surgeon: Royston Cowper, MD;  Location: [facility] ORS;  Service: Urology;  Laterality: Left;    Family Psychiatric History: I have reviewed family psychiatric history from my progress note on 02/26/2018.  Family History:  Family History  Problem Relation Age of Onset  . Depression Mother   . Anxiety disorder Mother   . Breast cancer Neg Hx     Social History: Reviewed social history from my progress note on 02/26/2018. Social History   Socioeconomic History  . Marital status: Widowed    Spouse name: Not on file  . Number of children: 2  . Years of education: Not on file  . Highest education level: High school graduate  Occupational History    Comment: fulltime  Tobacco Use  . Smoking status: Former Smoker    Start date: 11/14/1974    Quit date: 05/14/2014    Years since quitting: 6.7  . Smokeless tobacco: Never Used  Vaping Use  . Vaping Use: Every day  Substance and Sexual Activity  . Alcohol use: Not Currently    Alcohol/week: 0.0 - 1.0 standard drinks  . Drug use: No  . Sexual activity: Yes    Partners:  Male    Birth control/protection: Condom  Other Topics Concern  . Not on file  Social History Narrative  . Not on file   Social Determinants of Health   Financial Resource Strain: Not on file  Food Insecurity: Not on file  Transportation Needs: Not on file  Physical Activity: Not on file  Stress: Not on file  Social Connections: Not on file    Allergies:  Allergies  Allergen Reactions  . Codeine Nausea Only    Metabolic Disorder Labs: No results found for: HGBA1C, MPG No results found for: PROLACTIN No results found for: CHOL, TRIG, HDL, CHOLHDL, VLDL, LDLCALC No results found for: TSH  Therapeutic Level Labs: No results found for: LITHIUM No results found for: VALPROATE No components found for:  CBMZ  Current Medications: Current Outpatient Medications  Medication Sig Dispense Refill  . acetaminophen (TYLENOL) 650 MG CR tablet Take 1,300 mg by mouth every  8 (eight) hours as needed for pain.    Marland Kitchen amitriptyline (ELAVIL) 25 MG tablet Take 1 tablet (25 mg total) by mouth at bedtime. 90 tablet 0  . azelastine (ASTELIN) 0.1 % nasal spray Place 1 spray into both nostrils 2 (two) times daily as needed for allergies.     Marland Kitchen buPROPion (WELLBUTRIN XL) 300 MG 24 hr tablet TAKE 1 TABLET BY MOUTH EVERY DAY 90 tablet 1  . busPIRone (BUSPAR) 10 MG tablet Take 1 tablet (10 mg total) by mouth daily as needed. For anxiety 90 tablet 0  . chlorpheniramine (CHLOR-TRIMETON) 4 MG tablet Take 4 mg by mouth 2 (two) times daily as needed for allergies.    . DEXILANT 60 MG capsule Take 60 mg by mouth daily.  2  . fluticasone (FLONASE) 50 MCG/ACT nasal spray Place 1 spray into both nostrils daily.    Marland Kitchen gabapentin (NEURONTIN) 100 MG capsule Take 4 capsules TID (Patient taking differently: Take 400 mg by mouth 2 (two) times daily.) 360 capsule 11  . gabapentin (NEURONTIN) 300 MG capsule Take 1 capsule by mouth 2 (two) times daily. (Patient not taking: Reported on 02/14/2021)    . hydrOXYzine  (ATARAX/VISTARIL) 10 MG tablet TAKE 1-2 TABLETS (10-20 MG TOTAL) BY MOUTH 2 (TWO) TIMES DAILY AS NEEDED (SEVERE ANXIETY SX). (Patient taking differently: Take 10 mg by mouth daily as needed for anxiety.) 120 tablet 11  . ibuprofen (ADVIL) 200 MG tablet Take 400 mg by mouth every 6 (six) hours as needed for headache or moderate pain.    Marland Kitchen levothyroxine (SYNTHROID) 100 MCG tablet Take 100 mcg by mouth daily.    Marland Kitchen levothyroxine (SYNTHROID) 125 MCG tablet Take 125 mcg by mouth daily before breakfast.    . lisinopril-hydrochlorothiazide (PRINZIDE,ZESTORETIC) 10-12.5 MG tablet Take 1 tablet by mouth daily.   2  . Multiple Vitamins-Minerals (MULTIVITAMIN WITH MINERALS) tablet Take 1 tablet by mouth daily.    . mupirocin ointment (BACTROBAN) 2 % Apply 1 application topically daily as needed (Wound). (Patient not taking: Reported on 01/05/2021)    . oxyCODONE-acetaminophen (PERCOCET) 5-325 MG tablet Take 1 tablet by mouth every 4 (four) hours as needed for severe pain. 20 tablet 0  . rosuvastatin (CRESTOR) 20 MG tablet Take 20 mg by mouth daily.    . TURMERIC PO Take 100 mg by mouth daily.    . Vitamin D, Ergocalciferol, (DRISDOL) 50000 units CAPS capsule Take 50,000 Units by mouth every Wednesday.  3   No current facility-administered medications for this visit.     Musculoskeletal: Strength & Muscle Tone: UTA Gait & Station: UTA Patient leans: N/A  Psychiatric Specialty Exam: Review of Systems  Psychiatric/Behavioral: Negative for agitation, behavioral problems, confusion, decreased concentration, dysphoric mood, hallucinations, self-injury, sleep disturbance and suicidal ideas. The patient is not nervous/anxious and is not hyperactive.   All other systems reviewed and are negative.   There were no vitals taken for this visit.There is no height or weight on file to calculate BMI.  General Appearance: Casual  Eye Contact:  Fair  Speech:  Clear and Coherent  Volume:  Normal  Mood:  Euthymic   Affect:  Congruent  Thought Process:  Goal Directed and Descriptions of Associations: Intact  Orientation:  Full (Time, Place, and Person)  Thought Content: Logical   Suicidal Thoughts:  No  Homicidal Thoughts:  No  Memory:  Immediate;   Fair Recent;   Fair Remote;   Fair  Judgement:  Fair  Insight:  Fair  Psychomotor  Activity:  Normal  Concentration:  Concentration: Fair and Attention Span: Fair  Recall:  [employer] of Knowledge: Fair  Language: Fair  Akathisia:  No  Handed:  Right  AIMS (if indicated):UTA  Assets:  Communication Skills Desire for Improvement Housing Social Support Talents/Skills Transportation Vocational/Educational  ADL's:  Intact  Cognition: WNL  Sleep:  Fair   Screenings: GAD-7   Flowsheet Row Video Visit from 02/14/2021 in [facility]  Total GAD-7 Score 0    PHQ2-9   Flowsheet Row Video Visit from 02/14/2021 in [facility]  PHQ-2 Total Score 0    Flowsheet Row Video Visit from 02/14/2021 in [facility] [facility] (Discharged) from 01/05/2021 in [facility] Testing 45 from 12/15/2020 in [facility]  C-SSRS RISK CATEGORY No Risk No Risk No Risk       Assessment and Plan: SHAELEE FORNI is a 65 year old Caucasian female, widowed, has a history of MDD, fibromyalgia, memory changes who was evaluated by telemedicine today.  Patient with psychosocial stressors of the current pandemic, job related stressors however is currently stable.  Plan as noted below.  Plan Depression in remission Wellbutrin XL 300 mg p.o. daily.  Reduced dosage.  GAD-stable Elavil 25 mg p.o. daily Change BuSpar 10 mg p.o. daily as needed for severe anxiety.  She has been using it as needed only and wants to stay on it as needed. Hydroxyzine 20 mg p.o. daily as needed for severe anxiety  attacks  Panic attacks-stable Continue CBT  [facility] testing results reviewed while making medication changes  Follow-up in clinic in 3 months or sooner if needed.  This note was generated in part or whole with voice recognition software. Voice recognition is usually quite accurate but there are transcription errors that can and very often do occur. I apologize for any typographical errors that were not detected and corrected.       Evelyn Alert, MD 02/15/2021, 8:26 AM

## 2021-03-17 ENCOUNTER — Other Ambulatory Visit: Payer: Self-pay | Admitting: Psychiatry

## 2021-03-17 DIAGNOSIS — F411 Generalized anxiety disorder: Secondary | ICD-10-CM

## 2021-03-17 DIAGNOSIS — F410 Panic disorder [episodic paroxysmal anxiety] without agoraphobia: Secondary | ICD-10-CM

## 2021-03-29 ENCOUNTER — Encounter: Payer: Self-pay | Admitting: Podiatry

## 2021-03-29 ENCOUNTER — Other Ambulatory Visit: Payer: Self-pay

## 2021-03-29 ENCOUNTER — Ambulatory Visit (INDEPENDENT_AMBULATORY_CARE_PROVIDER_SITE_OTHER): Payer: BC Managed Care – PPO | Admitting: Podiatry

## 2021-03-29 DIAGNOSIS — Q828 Other specified congenital malformations of skin: Secondary | ICD-10-CM | POA: Diagnosis not present

## 2021-03-29 DIAGNOSIS — E1142 Type 2 diabetes mellitus with diabetic polyneuropathy: Secondary | ICD-10-CM

## 2021-03-29 DIAGNOSIS — M722 Plantar fascial fibromatosis: Secondary | ICD-10-CM | POA: Diagnosis not present

## 2021-03-29 DIAGNOSIS — G600 Hereditary motor and sensory neuropathy: Secondary | ICD-10-CM

## 2021-03-29 MED ORDER — TRIAMCINOLONE ACETONIDE 40 MG/ML IJ SUSP
20.0000 mg | Freq: Once | INTRAMUSCULAR | Status: AC
  Administered 2021-03-29: 20 mg

## 2021-03-29 NOTE — Progress Notes (Signed)
She presents today for follow-up of her diabetic neuropathy states that her right heel is starting to bother her and she just does not have any padding left under the knuckles of her feet.  She states that the gabapentin 400 mg is working well for her she is happy with that dosage at this point.  Objective: Vital signs are stable she is alert and oriented x3.  Pulses are palpable neurologic sensorium is unchanged.  She has pain on palpation medial calcaneal tubercle of the right heel.  No open lesions or wounds are noted.  Benign skin lesion subfirst left.  Assessment: Pain in limb with [place] fasciitis right.  Pain in limb with neuropathy.  Metatarsalgia also fat pad.  Plan: Discussed etiology pathology conservative versus surgical therapies.  Discussed appropriate shoe gear and ice therapy.  Placed her in a silicone forefoot pad bilaterally.  I injected her right heel today with 20 mg Kenalog 5 mg Marcaine.  I debrided the reactive benign skin lesions of the first metatarsal left foot.  Follow-up with her as needed.

## 2021-04-05 DIAGNOSIS — M5136 Other intervertebral disc degeneration, lumbar region: Secondary | ICD-10-CM | POA: Insufficient documentation

## 2021-04-05 DIAGNOSIS — M51369 Other intervertebral disc degeneration, lumbar region without mention of lumbar back pain or lower extremity pain: Secondary | ICD-10-CM | POA: Insufficient documentation

## 2021-04-09 ENCOUNTER — Other Ambulatory Visit: Payer: Self-pay | Admitting: Psychiatry

## 2021-04-09 DIAGNOSIS — F411 Generalized anxiety disorder: Secondary | ICD-10-CM

## 2021-04-09 DIAGNOSIS — F410 Panic disorder [episodic paroxysmal anxiety] without agoraphobia: Secondary | ICD-10-CM

## 2021-04-26 ENCOUNTER — Other Ambulatory Visit: Payer: Self-pay | Admitting: Family Medicine

## 2021-04-26 DIAGNOSIS — M5412 Radiculopathy, cervical region: Secondary | ICD-10-CM

## 2021-05-09 ENCOUNTER — Ambulatory Visit: Payer: BC Managed Care – PPO

## 2021-05-10 ENCOUNTER — Other Ambulatory Visit: Payer: Self-pay

## 2021-05-10 ENCOUNTER — Ambulatory Visit
Admission: RE | Admit: 2021-05-10 | Discharge: 2021-05-10 | Disposition: A | Discharge status: Home / Self Care | Payer: BC Managed Care – PPO | Source: Ambulatory Visit | Attending: Family Medicine | Admitting: Family Medicine

## 2021-05-10 DIAGNOSIS — M5412 Radiculopathy, cervical region: Secondary | ICD-10-CM | POA: Insufficient documentation

## 2021-05-22 ENCOUNTER — Other Ambulatory Visit: Payer: Self-pay | Admitting: Psychiatry

## 2021-05-22 DIAGNOSIS — F411 Generalized anxiety disorder: Secondary | ICD-10-CM

## 2021-05-22 DIAGNOSIS — F410 Panic disorder [episodic paroxysmal anxiety] without agoraphobia: Secondary | ICD-10-CM

## 2021-05-23 ENCOUNTER — Other Ambulatory Visit: Payer: Self-pay

## 2021-05-23 ENCOUNTER — Encounter: Payer: Self-pay | Admitting: Psychiatry

## 2021-05-23 ENCOUNTER — Telehealth (INDEPENDENT_AMBULATORY_CARE_PROVIDER_SITE_OTHER): Payer: BC Managed Care – PPO | Admitting: Psychiatry

## 2021-05-23 DIAGNOSIS — F5105 Insomnia due to other mental disorder: Secondary | ICD-10-CM | POA: Diagnosis not present

## 2021-05-23 DIAGNOSIS — F411 Generalized anxiety disorder: Secondary | ICD-10-CM | POA: Diagnosis not present

## 2021-05-23 DIAGNOSIS — F3342 Major depressive disorder, recurrent, in full remission: Secondary | ICD-10-CM

## 2021-05-23 NOTE — Progress Notes (Signed)
Virtual Visit via Video Note  I connected with Sabah Zucco Lutes on 05/24/21 at  4:40 PM EDT by a video enabled telemedicine application and verified that I am speaking with the correct person using two identifiers.  Location Provider Location : [facility] Patient Location : Work  Participants: Patient , Provider   I discussed the limitations of evaluation and management by telemedicine and the availability of in person appointments. The patient expressed understanding and agreed to proceed.   I discussed the assessment and treatment plan with the patient. The patient was provided an opportunity to ask questions and all were answered. The patient agreed with the plan and demonstrated an understanding of the instructions.   The patient was advised to call back or seek an in-person evaluation if the symptoms worsen or if the condition fails to improve as anticipated.  Video connection was lost at less than 50% of the duration of the visit, at which time the remainder of the visit was completed through audio only   [facility] MD OP Progress Note  05/24/2021 2:50 PM REYNE FALCONI  MRN:  [id number]  Chief Complaint:  Chief Complaint   Follow-up; Anxiety; Depression    HPI: JONNY LONGINO is a 65 year old female, widowed, employed, has a history of MDD, panic attacks, cognitive disorder, GAD, currently lives in [place] was evaluated by telemedicine today.  Patient today reports she is currently doing well with regards to her mood.  Denies any significant depressive symptoms.  Patient reports anxiety as under control.  She reports sleep as good.  She continues to be able to function at work.  She reports her concentration and memory as improving.  She continues to follow-up with her psychologist and reports she had repeat neuropsychological testing completed recently and was told that she is making progress.  She agrees to sign a release to obtain medical records and her testing  report.  Patient denies any suicidality, homicidality or perceptual disturbances.  Patient denies any other concerns today.  Visit Diagnosis:    ICD-10-CM   1. MDD (major depressive disorder), recurrent, in full remission ([place])  F33.42     2. GAD (generalized anxiety disorder)  F41.1     3. Insomnia due to mental condition  F51.05    anxiety      Past Psychiatric History: I have reviewed past psychiatric history from progress note on 02/26/2018  Past Medical History:  Past Medical History:  Diagnosis Date   Anxiety    Arthritis    Bursitis    Rt hip   Complication of anesthesia    nausea, BLOOD PRESSURE DROPPED  08/2020   Depression    Family history of adverse reaction to anesthesia    mother, extensive vomiting    Fibromyalgia    GERD (gastroesophageal reflux disease)    History of kidney stones    Hypercholesteremia    Hypertension    Hypothyroidism    PONV (postoperative nausea and vomiting)    Sleep apnea    MILD- DOESN'T NEED cpap   Thyroid disease     Past Surgical History:  Procedure Laterality Date   ABDOMINAL HYSTERECTOMY     ANTERIOR INTEROSSEOUS NERVE DECOMPRESSION Left 01/05/2021   Procedure: Cubital tunnel release, left;  Surgeon: Hessie Knows, MD;  Location: [facility] ORS;  Service: Orthopedics;  Laterality: Left;   CARPAL TUNNEL RELEASE Right 09/01/2020   Procedure: Right carpal tunnel release;  Surgeon: Hessie Knows, MD;  Location: [facility]S;  Service: Orthopedics;  Laterality: Right;  CARPAL TUNNEL RELEASE Left 01/05/2021   Procedure: Carpal tunnel release, left;  Surgeon: Hessie Knows, MD;  Location: [facility] ORS;  Service: Orthopedics;  Laterality: Left;   CATARACT EXTRACTION, BILATERAL     CHOLECYSTECTOMY     COLONOSCOPY WITH PROPOFOL N/A 11/13/2016   Procedure: COLONOSCOPY WITH PROPOFOL;  Surgeon: Lollie Sails, MD;  Location: [facility] ENDOSCOPY;  Service: Endoscopy;  Laterality: N/A;   ESOPHAGOGASTRODUODENOSCOPY (EGD) WITH PROPOFOL N/A 11/13/2016    Procedure: ESOPHAGOGASTRODUODENOSCOPY (EGD) WITH PROPOFOL;  Surgeon: Lollie Sails, MD;  Location: [facility] ENDOSCOPY;  Service: Endoscopy;  Laterality: N/A;   ESOPHAGOGASTRODUODENOSCOPY (EGD) WITH PROPOFOL N/A 12/16/2018   Procedure: ESOPHAGOGASTRODUODENOSCOPY (EGD) WITH PROPOFOL;  Surgeon: Lollie Sails, MD;  Location: [facility] ENDOSCOPY;  Service: Endoscopy;  Laterality: N/A;   LAPAROSCOPIC HYSTERECTOMY Bilateral 02/28/2016   Procedure: HYSTERECTOMY TOTAL LAPAROSCOPIC / BSO;  Surgeon: Honor Loh Ward, MD;  Location: [facility] ORS;  Service: Gynecology;  Laterality: Bilateral;   NECK SURGERY     X 2   NISSEN FUNDOPLICATION     TUBAL LIGATION     URETEROSCOPY WITH HOLMIUM LASER LITHOTRIPSY Left 07/03/2016   Procedure: URETEROSCOPY WITH HOLMIUM LASER LITHOTRIPSY;  Surgeon: Royston Cowper, MD;  Location: [facility] ORS;  Service: Urology;  Laterality: Left;    Family Psychiatric History: I have reviewed family psychiatric history from progress note on 02/26/2018  Family History:  Family History  Problem Relation Age of Onset   Depression Mother    Anxiety disorder Mother    Breast cancer Neg Hx     Social History: I have reviewed social history from progress note on 02/26/2018 Social History   Socioeconomic History   Marital status: Widowed    Spouse name: Not on file   Number of children: 2   Years of education: Not on file   Highest education level: High school graduate  Occupational History    Comment: fulltime  Tobacco Use   Smoking status: Former    Pack years: 0.00    Types: Cigarettes    Start date: 11/14/1974    Quit date: 05/14/2014    Years since quitting: 7.0   Smokeless tobacco: Never  Vaping Use   Vaping Use: Every day  Substance and Sexual Activity   Alcohol use: Not Currently    Alcohol/week: 0.0 - 1.0 standard drinks   Drug use: No   Sexual activity: Yes    Partners: Male    Birth control/protection: Condom  Other Topics Concern   Not on file  Social History Narrative    Not on file   Social Determinants of Health   Financial Resource Strain: Not on file  Food Insecurity: Not on file  Transportation Needs: Not on file  Physical Activity: Not on file  Stress: Not on file  Social Connections: Not on file    Allergies:  Allergies  Allergen Reactions   Codeine Nausea Only    Metabolic Disorder Labs: No results found for: HGBA1C, MPG No results found for: PROLACTIN No results found for: CHOL, TRIG, HDL, CHOLHDL, VLDL, LDLCALC No results found for: TSH  Therapeutic Level Labs: No results found for: LITHIUM No results found for: VALPROATE No components found for:  CBMZ  Current Medications: Current Outpatient Medications  Medication Sig Dispense Refill   gabapentin (NEURONTIN) 300 MG capsule Take 1 capsule by mouth 3 (three) times daily.     acetaminophen (TYLENOL) 650 MG CR tablet Take 1,300 mg by mouth every 8 (eight) hours as needed for pain.  amitriptyline (ELAVIL) 25 MG tablet TAKE 1 TABLET BY MOUTH EVERYDAY AT BEDTIME 90 tablet 0   azelastine (ASTELIN) 0.1 % nasal spray Place 1 spray into both nostrils 2 (two) times daily as needed for allergies.      buPROPion (WELLBUTRIN XL) 300 MG 24 hr tablet TAKE 1 TABLET BY MOUTH EVERY DAY 90 tablet 1   busPIRone (BUSPAR) 10 MG tablet TAKE 1 TABLET (10 MG TOTAL) BY MOUTH DAILY AS NEEDED. FOR ANXIETY 90 tablet 0   chlorpheniramine (CHLOR-TRIMETON) 4 MG tablet Take 4 mg by mouth 2 (two) times daily as needed for allergies.     DEXILANT 60 MG capsule Take 60 mg by mouth daily.  2   fluticasone (FLONASE) 50 MCG/ACT nasal spray Place 1 spray into both nostrils daily.     gabapentin (NEURONTIN) 100 MG capsule Take 4 capsules TID (Patient taking differently: Take 400 mg by mouth 2 (two) times daily.) 360 capsule 11   gabapentin (NEURONTIN) 300 MG capsule Take 1 capsule by mouth 2 (two) times daily. (Patient not taking: Reported on 02/14/2021)     hydrOXYzine (ATARAX/VISTARIL) 10 MG tablet TAKE 1-2 TABLETS  (10-20 MG TOTAL) BY MOUTH 2 (TWO) TIMES DAILY AS NEEDED (SEVERE ANXIETY SX). (Patient taking differently: Take 10 mg by mouth daily as needed for anxiety.) 120 tablet 11   ibuprofen (ADVIL) 200 MG tablet Take 400 mg by mouth every 6 (six) hours as needed for headache or moderate pain.     levothyroxine (SYNTHROID) 100 MCG tablet Take 100 mcg by mouth daily.     levothyroxine (SYNTHROID) 125 MCG tablet Take 125 mcg by mouth daily before breakfast. (Patient not taking: Reported on 05/23/2021)     levothyroxine (SYNTHROID) 137 MCG tablet Take 137 mcg by mouth every morning. (Patient not taking: Reported on 05/23/2021)     lisinopril-hydrochlorothiazide (PRINZIDE,ZESTORETIC) 10-12.5 MG tablet Take 1 tablet by mouth daily.   2   Multiple Vitamins-Minerals (MULTIVITAMIN WITH MINERALS) tablet Take 1 tablet by mouth daily.     mupirocin ointment (BACTROBAN) 2 % Apply 1 application topically daily as needed (Wound). (Patient not taking: Reported on 01/05/2021)     oxyCODONE-acetaminophen (PERCOCET) 5-325 MG tablet Take 1 tablet by mouth every 4 (four) hours as needed for severe pain. 20 tablet 0   rosuvastatin (CRESTOR) 20 MG tablet Take 20 mg by mouth daily.     TURMERIC PO Take 100 mg by mouth daily.     Vitamin D, Ergocalciferol, (DRISDOL) 50000 units CAPS capsule Take 50,000 Units by mouth every Wednesday.  3   No current facility-administered medications for this visit.     Musculoskeletal: Strength & Muscle Tone:  UTA Gait & Station:  UTA Patient leans: N/A  Psychiatric Specialty Exam: Review of Systems  Psychiatric/Behavioral:  The patient is nervous/anxious.   All other systems reviewed and are negative.  There were no vitals taken for this visit.There is no height or weight on file to calculate BMI.  General Appearance: Casual  Eye Contact:  Good  Speech:  Clear and Coherent  Volume:  Normal  Mood:  Anxious coping well  Affect:  Appropriate  Thought Process:  Goal Directed and  Descriptions of Associations: Intact  Orientation:  Full (Time, Place, and Person)  Thought Content: Logical   Suicidal Thoughts:  No  Homicidal Thoughts:  No  Memory:  Immediate;   Fair Recent;   Fair Remote;   Fair  Judgement:  Fair  Insight:  Fair  Psychomotor Activity:  Normal  Concentration:  Concentration: Fair and Attention Span: Fair  Recall:  [employer] of Knowledge: Fair  Language: Fair  Akathisia:  No  Handed:  Right  AIMS (if indicated): not done  Assets:  Communication Skills Desire for Improvement Housing Social Support  ADL's:  Intact  Cognition: WNL  Sleep:  Fair   Screenings: GAD-7    Flowsheet Row Video Visit from 05/23/2021 in [place] Video Visit from 02/14/2021 in [place]  Total GAD-7 Score 0 0      PHQ2-9    Flowsheet Row Video Visit from 05/23/2021 in [place] Video Visit from 02/14/2021 in [place]  PHQ-2 Total Score 0 0      Flowsheet Row Video Visit from 02/14/2021 in [place] [facility] (Discharged) from 01/05/2021 in [place] [facility] from 12/15/2020 in [place] TESTING  C-SSRS RISK CATEGORY No Risk No Risk No Risk        Assessment and Plan: JENNIAH BHAVSAR is a 65 year old Caucasian female, widowed, has a history of MDD, fibromyalgia, memory changes was evaluated by telemedicine today.  Patient is currently stable.  Plan Depression in remission Wellbutrin XL 300 mg p.o. daily-reduced dosage  GAD-stable Elavil 25 mg p.o. daily BuSpar 10 mg p.o. daily as needed for severe anxiety Hydroxyzine 20 mg p.o. daily as needed for severe anxiety attacks  Insomnia due to mental disorder-stable Will monitor closely Continue Elavil which helps.  Patient with history of memory problems-had repeat  neuropsychological testing done.  Discussed to sign a release to obtain medical records  [facility] testing results reviewed while making medication changes.  Follow-up in clinic in 3 months or sooner in office.   I have spent at least 18 minutes non face to face with patient today .   This note was generated in part or whole with voice recognition software. Voice recognition is usually quite accurate but there are transcription errors that can and very often do occur. I apologize for any typographical errors that were not detected and corrected.     Ursula Alert, MD 05/24/2021, 2:50 PM

## 2021-06-12 ENCOUNTER — Encounter: Payer: BC Managed Care – PPO | Admitting: Podiatry

## 2021-07-13 ENCOUNTER — Other Ambulatory Visit: Payer: Self-pay | Admitting: Psychiatry

## 2021-07-13 DIAGNOSIS — F3342 Major depressive disorder, recurrent, in full remission: Secondary | ICD-10-CM

## 2021-07-17 ENCOUNTER — Other Ambulatory Visit: Payer: Self-pay | Admitting: Psychiatry

## 2021-07-17 DIAGNOSIS — F411 Generalized anxiety disorder: Secondary | ICD-10-CM

## 2021-07-17 DIAGNOSIS — F410 Panic disorder [episodic paroxysmal anxiety] without agoraphobia: Secondary | ICD-10-CM

## 2021-07-21 ENCOUNTER — Other Ambulatory Visit: Payer: Self-pay | Admitting: Family Medicine

## 2021-07-21 DIAGNOSIS — R1031 Right lower quadrant pain: Secondary | ICD-10-CM

## 2021-07-30 ENCOUNTER — Ambulatory Visit
Admission: RE | Admit: 2021-07-30 | Discharge: 2021-07-30 | Disposition: A | Discharge status: Home / Self Care | Payer: BC Managed Care – PPO | Source: Ambulatory Visit | Attending: Family Medicine | Admitting: Family Medicine

## 2021-07-30 ENCOUNTER — Other Ambulatory Visit: Payer: Self-pay

## 2021-07-30 DIAGNOSIS — R1031 Right lower quadrant pain: Secondary | ICD-10-CM | POA: Diagnosis present

## 2021-08-11 ENCOUNTER — Other Ambulatory Visit: Payer: Self-pay | Admitting: Internal Medicine

## 2021-08-11 DIAGNOSIS — Z1231 Encounter for screening mammogram for malignant neoplasm of breast: Secondary | ICD-10-CM

## 2021-08-22 ENCOUNTER — Encounter: Payer: Self-pay | Admitting: Psychiatry

## 2021-08-22 ENCOUNTER — Other Ambulatory Visit: Payer: Self-pay

## 2021-08-22 ENCOUNTER — Telehealth (INDEPENDENT_AMBULATORY_CARE_PROVIDER_SITE_OTHER): Payer: BC Managed Care – PPO | Admitting: Psychiatry

## 2021-08-22 DIAGNOSIS — F3342 Major depressive disorder, recurrent, in full remission: Secondary | ICD-10-CM

## 2021-08-22 DIAGNOSIS — G4701 Insomnia due to medical condition: Secondary | ICD-10-CM

## 2021-08-22 DIAGNOSIS — F410 Panic disorder [episodic paroxysmal anxiety] without agoraphobia: Secondary | ICD-10-CM

## 2021-08-22 DIAGNOSIS — F411 Generalized anxiety disorder: Secondary | ICD-10-CM

## 2021-08-22 DIAGNOSIS — F5105 Insomnia due to other mental disorder: Secondary | ICD-10-CM

## 2021-08-22 MED ORDER — AMITRIPTYLINE HCL 25 MG PO TABS: ORAL_TABLET | ORAL | 0 refills | Status: DC

## 2021-08-22 NOTE — Progress Notes (Signed)
Virtual Visit via Video Note  I connected with Evelyn Bentley on 08/22/21 at  4:00 PM EDT by a video enabled telemedicine application and verified that I am speaking with the correct person using two identifiers.  Location Provider Location : [facility] Patient Location : Work  Participants: Patient , Provider   I discussed the limitations of evaluation and management by telemedicine and the availability of in person appointments. The patient expressed understanding and agreed to proceed.   I discussed the assessment and treatment plan with the patient. The patient was provided an opportunity to ask questions and all were answered. The patient agreed with the plan and demonstrated an understanding of the instructions.   The patient was advised to call back or seek an in-person evaluation if the symptoms worsen or if the condition fails to improve as anticipated.   Erin MD OP Progress Note  08/22/2021 5:18 PM Evelyn Bentley  MRN:  [id number]  Chief Complaint:  Chief Complaint   Follow-up; Anxiety; Depression    HPI: Evelyn Bentley is a 65 year old female , widowed, employed, has a history of MDD, cognitive disorder, GAD, insomnia, chronic pain, lives in [place] was evaluated by telemedicine today.  Patient was supposed to come into the office today however this appointment was changed to virtual visit since patient did not show up, patient was contacted by phone, patient agreed to do a video visit.  Patient today reports that overall her mood symptoms are okay.  She denies any significant depression.  She does report anxiety symptoms especially about her pain and upcoming surgery.  She is scheduled to have a hip replacement surgery in November.  She is currently in a lot of pain.  She reports the pain does have an impact on her sleep.  She goes to bed at around 9 PM however lays awake trying to find a comfortable position to sleep at least the next 3 hours or so.  She has been trying to  take ibuprofen at night recently which helps to some extent.  She reports once she is able to fall asleep the amitriptyline helps her to stay asleep.  She does not want to increase the dosage of amitriptyline since it makes her groggy if she takes it late.  She also has hydroxyzine available which helps her with her sleep.  She uses it as needed.  Patient denies any suicidality, homicidality or perceptual disturbances.  Patient is currently following up with the weight loss clinic and has lost 35 pounds in the past 3 months.  Patient denies any other concerns today.  Visit Diagnosis:    ICD-10-CM   1. MDD (major depressive disorder), recurrent, in full remission ([place])  F33.42     2. GAD (generalized anxiety disorder)  F41.1 amitriptyline (ELAVIL) 25 MG tablet    3. Insomnia due to medical condition  G47.01    pain      Past Psychiatric History: Reviewed past psychiatric history from progress note on 02/26/2018  Past Medical History:  Past Medical History:  Diagnosis Date   Anxiety    Arthritis    Bursitis    Rt hip   Complication of anesthesia    nausea, BLOOD PRESSURE DROPPED  08/2020   Depression    Family history of adverse reaction to anesthesia    mother, extensive vomiting    Fibromyalgia    GERD (gastroesophageal reflux disease)    History of kidney stones    Hypercholesteremia    Hypertension  Hypothyroidism    PONV (postoperative nausea and vomiting)    Sleep apnea    MILD- DOESN'T NEED cpap   Thyroid disease     Past Surgical History:  Procedure Laterality Date   ABDOMINAL HYSTERECTOMY     ANTERIOR INTEROSSEOUS NERVE DECOMPRESSION Left 01/05/2021   Procedure: Cubital tunnel release, left;  Surgeon: Hessie Knows, MD;  Location: [facility] ORS;  Service: Orthopedics;  Laterality: Left;   CARPAL TUNNEL RELEASE Right 09/01/2020   Procedure: Right carpal tunnel release;  Surgeon: Hessie Knows, MD;  Location: [facility] ORS;  Service: Orthopedics;  Laterality: Right;    CARPAL TUNNEL RELEASE Left 01/05/2021   Procedure: Carpal tunnel release, left;  Surgeon: Hessie Knows, MD;  Location: [facility] ORS;  Service: Orthopedics;  Laterality: Left;   CATARACT EXTRACTION, BILATERAL     CHOLECYSTECTOMY     COLONOSCOPY WITH PROPOFOL N/A 11/13/2016   Procedure: COLONOSCOPY WITH PROPOFOL;  Surgeon: Lollie Sails, MD;  Location: [facility] ENDOSCOPY;  Service: Endoscopy;  Laterality: N/A;   ESOPHAGOGASTRODUODENOSCOPY (EGD) WITH PROPOFOL N/A 11/13/2016   Procedure: ESOPHAGOGASTRODUODENOSCOPY (EGD) WITH PROPOFOL;  Surgeon: Lollie Sails, MD;  Location: [facility] ENDOSCOPY;  Service: Endoscopy;  Laterality: N/A;   ESOPHAGOGASTRODUODENOSCOPY (EGD) WITH PROPOFOL N/A 12/16/2018   Procedure: ESOPHAGOGASTRODUODENOSCOPY (EGD) WITH PROPOFOL;  Surgeon: Lollie Sails, MD;  Location: [facility] ENDOSCOPY;  Service: Endoscopy;  Laterality: N/A;   LAPAROSCOPIC HYSTERECTOMY Bilateral 02/28/2016   Procedure: HYSTERECTOMY TOTAL LAPAROSCOPIC / BSO;  Surgeon: Honor Loh Ward, MD;  Location: [facility] ORS;  Service: Gynecology;  Laterality: Bilateral;   NECK SURGERY     X 2   NISSEN FUNDOPLICATION     TUBAL LIGATION     URETEROSCOPY WITH HOLMIUM LASER LITHOTRIPSY Left 07/03/2016   Procedure: URETEROSCOPY WITH HOLMIUM LASER LITHOTRIPSY;  Surgeon: Royston Cowper, MD;  Location: [facility] ORS;  Service: Urology;  Laterality: Left;    Family Psychiatric History: Reviewed family psychiatric history from progress note on 02/26/2018  Family History:  Family History  Problem Relation Age of Onset   Depression Mother    Anxiety disorder Mother    Breast cancer Neg Hx     Social History: Reviewed social history from progress note on 02/26/2018 Social History   Socioeconomic History   Marital status: Widowed    Spouse name: Not on file   Number of children: 2   Years of education: Not on file   Highest education level: High school graduate  Occupational History    Comment: fulltime  Tobacco Use   Smoking status:  Former    Types: Cigarettes    Start date: 11/14/1974    Quit date: 05/14/2014    Years since quitting: 7.2   Smokeless tobacco: Never  Vaping Use   Vaping Use: Every day  Substance and Sexual Activity   Alcohol use: Not Currently    Alcohol/week: 0.0 - 1.0 standard drinks   Drug use: No   Sexual activity: Yes    Partners: Male    Birth control/protection: Condom  Other Topics Concern   Not on file  Social History Narrative   Not on file   Social Determinants of Health   Financial Resource Strain: Not on file  Food Insecurity: Not on file  Transportation Needs: Not on file  Physical Activity: Not on file  Stress: Not on file  Social Connections: Not on file    Allergies:  Allergies  Allergen Reactions   Codeine Nausea Only    Metabolic Disorder Labs: No results found for: HGBA1C,  MPG No results found for: PROLACTIN No results found for: CHOL, TRIG, HDL, CHOLHDL, VLDL, LDLCALC No results found for: TSH  Therapeutic Level Labs: No results found for: LITHIUM No results found for: VALPROATE No components found for:  CBMZ  Current Medications: Current Outpatient Medications  Medication Sig Dispense Refill   acetaminophen (TYLENOL) 650 MG CR tablet Take 1,300 mg by mouth every 8 (eight) hours as needed for pain.     amitriptyline (ELAVIL) 25 MG tablet TAKE 1 TABLET BY MOUTH EVERYDAY AT BEDTIME 90 tablet 0   azelastine (ASTELIN) 0.1 % nasal spray Place 1 spray into both nostrils 2 (two) times daily as needed for allergies.      buPROPion (WELLBUTRIN XL) 300 MG 24 hr tablet TAKE 1 TABLET BY MOUTH EVERY DAY 90 tablet 1   busPIRone (BUSPAR) 10 MG tablet TAKE 1 TABLET (10 MG TOTAL) BY MOUTH DAILY AS NEEDED. FOR ANXIETY 90 tablet 0   chlorpheniramine (CHLOR-TRIMETON) 4 MG tablet Take 4 mg by mouth 2 (two) times daily as needed for allergies.     DEXILANT 60 MG capsule Take 60 mg by mouth daily.  2   fluticasone (FLONASE) 50 MCG/ACT nasal spray Place 1 spray into both nostrils  daily.     gabapentin (NEURONTIN) 100 MG capsule Take 4 capsules TID (Patient taking differently: Take 400 mg by mouth 2 (two) times daily.) 360 capsule 11   gabapentin (NEURONTIN) 300 MG capsule Take 1 capsule by mouth 2 (two) times daily. (Patient not taking: Reported on 02/14/2021)     gabapentin (NEURONTIN) 300 MG capsule Take 1 capsule by mouth 3 (three) times daily.     hydrOXYzine (ATARAX/VISTARIL) 10 MG tablet TAKE 1-2 TABLETS (10-20 MG TOTAL) BY MOUTH 2 (TWO) TIMES DAILY AS NEEDED (SEVERE ANXIETY SX). (Patient taking differently: Take 10 mg by mouth daily as needed for anxiety.) 120 tablet 11   ibuprofen (ADVIL) 200 MG tablet Take 400 mg by mouth every 6 (six) hours as needed for headache or moderate pain.     levothyroxine (SYNTHROID) 100 MCG tablet Take 100 mcg by mouth daily.     levothyroxine (SYNTHROID) 125 MCG tablet Take 125 mcg by mouth daily before breakfast. (Patient not taking: Reported on 05/23/2021)     levothyroxine (SYNTHROID) 137 MCG tablet Take 137 mcg by mouth every morning. (Patient not taking: Reported on 05/23/2021)     lisinopril-hydrochlorothiazide (PRINZIDE,ZESTORETIC) 10-12.5 MG tablet Take 1 tablet by mouth daily.   2   Multiple Vitamins-Minerals (MULTIVITAMIN WITH MINERALS) tablet Take 1 tablet by mouth daily.     mupirocin ointment (BACTROBAN) 2 % Apply 1 application topically daily as needed (Wound). (Patient not taking: Reported on 01/05/2021)     oxyCODONE-acetaminophen (PERCOCET) 5-325 MG tablet Take 1 tablet by mouth every 4 (four) hours as needed for severe pain. 20 tablet 0   rosuvastatin (CRESTOR) 20 MG tablet Take 20 mg by mouth daily.     TURMERIC PO Take 100 mg by mouth daily.     Vitamin D, Ergocalciferol, (DRISDOL) 50000 units CAPS capsule Take 50,000 Units by mouth every Wednesday.  3   No current facility-administered medications for this visit.     Musculoskeletal: Strength & Muscle Tone:  UTA Gait & Station: normal Patient leans:  N/A  Psychiatric Specialty Exam: Review of Systems  Musculoskeletal:        Rt. Hip pain  Psychiatric/Behavioral:  Positive for sleep disturbance. Negative for agitation, behavioral problems, confusion, decreased concentration and hallucinations. The patient is  not nervous/anxious.   All other systems reviewed and are negative.  There were no vitals taken for this visit.There is no height or weight on file to calculate BMI.  General Appearance: Casual  Eye Contact:  Fair  Speech:  Clear and Coherent  Volume:  Normal  Mood:  Euthymic  Affect:  Congruent  Thought Process:  Goal Directed and Descriptions of Associations: Intact  Orientation:  Full (Time, Place, and Person)  Thought Content: Logical   Suicidal Thoughts:  No  Homicidal Thoughts:  No  Memory:  Immediate;   Fair Recent;   Fair Remote;   Fair  Judgement:  Fair  Insight:  Fair  Psychomotor Activity:  Normal  Concentration:  Concentration: Fair and Attention Span: Fair  Recall:  [employer] of Knowledge: Fair  Language: Fair  Akathisia:  No  Handed:  Right  AIMS (if indicated): done  Assets:  Communication Skills Desire for Improvement Housing Social Support  ADL's:  Intact  Cognition: WNL  Sleep:  restless   Screenings: GAD-7    Flowsheet Row Video Visit from 05/23/2021 in [facility] Video Visit from 02/14/2021 in [facility]  Total GAD-7 Score 0 0      PHQ2-9    Flowsheet Row Video Visit from 05/23/2021 in [facility] Video Visit from 02/14/2021 in [place]  PHQ-2 Total Score 0 0      Flowsheet Row Video Visit from 02/14/2021 in [facility] Admission (Discharged) from 01/05/2021 in [facility] from 12/15/2020 in [facility] [place] TESTING  C-SSRS RISK CATEGORY No Risk No Risk  No Risk        Assessment and Plan: Evelyn Bentley is a 65 year old Caucasian female, widowed, has a history of MDD, fibromyalgia, chronic pain was evaluated by telemedicine today.  Patient is currently struggling with pain which does have an impact on her sleep, otherwise doing fairly well with regards to her mood.  Plan as noted below.  Plan Depression in remission Wellbutrin XL 300 mg p.o. daily-reduced dosage  GAD-stable Elavil 25 mg p.o. daily BuSpar 10 mg p.o. daily as needed for severe anxiety Hydroxyzine 20 mg p.o. daily as needed for severe anxiety attacks  Insomnia due to medical problems likely pain-unstable Continue Elavil. She is not interested in dosage increase Continue hydroxyzine 20 mg p.o. at bedtime as needed.  She takes it at times and helps. Continued sleep hygiene techniques as well as she will need sufficient pain management.  Patient with right-sided hip pain, was told she needs hip replacement surgery.  Patient likely will have her surgery end of November.  Follow-up in clinic in 2 months or sooner if needed.  This note was generated in part or whole with voice recognition software. Voice recognition is usually quite accurate but there are transcription errors that can and very often do occur. I apologize for any typographical errors that were not detected and corrected.     Ursula Alert, MD 08/23/2021, 8:30 AM

## 2021-09-21 ENCOUNTER — Other Ambulatory Visit: Payer: Self-pay | Admitting: Orthopedic Surgery

## 2021-09-29 ENCOUNTER — Other Ambulatory Visit: Admission: RE | Admit: 2021-09-29 | Payer: BC Managed Care – PPO | Source: Ambulatory Visit

## 2021-10-09 ENCOUNTER — Other Ambulatory Visit: Payer: BC Managed Care – PPO

## 2021-10-17 ENCOUNTER — Other Ambulatory Visit: Admission: RE | Admit: 2021-10-17 | Payer: BC Managed Care – PPO | Source: Ambulatory Visit

## 2021-10-25 ENCOUNTER — Other Ambulatory Visit: Payer: BC Managed Care – PPO

## 2021-10-27 ENCOUNTER — Ambulatory Visit: Admit: 2021-10-27 | Payer: BC Managed Care – PPO | Admitting: Orthopedic Surgery

## 2021-10-27 SURGERY — ARTHROPLASTY, HIP, TOTAL, ANTERIOR APPROACH
Anesthesia: Choice | Site: Hip | Laterality: Right

## 2021-11-29 ENCOUNTER — Other Ambulatory Visit: Payer: Self-pay | Admitting: Orthopedic Surgery

## 2021-12-06 DIAGNOSIS — M19041 Primary osteoarthritis, right hand: Secondary | ICD-10-CM | POA: Insufficient documentation

## 2021-12-06 DIAGNOSIS — M150 Primary generalized (osteo)arthritis: Secondary | ICD-10-CM | POA: Insufficient documentation

## 2021-12-22 ENCOUNTER — Other Ambulatory Visit
Admission: RE | Admit: 2021-12-22 | Discharge: 2021-12-22 | Disposition: A | Discharge status: Home / Self Care | Payer: BC Managed Care – PPO | Source: Ambulatory Visit | Attending: Orthopedic Surgery | Admitting: Orthopedic Surgery

## 2021-12-22 ENCOUNTER — Other Ambulatory Visit: Payer: Self-pay

## 2021-12-22 VITALS — BP 145/67 | HR 74 | Temp 98.0°F | Resp 20 | Ht 60.0 in | Wt 149.1 lb

## 2021-12-22 DIAGNOSIS — Z01818 Encounter for other preprocedural examination: Secondary | ICD-10-CM | POA: Insufficient documentation

## 2021-12-22 DIAGNOSIS — Z1389 Encounter for screening for other disorder: Secondary | ICD-10-CM | POA: Insufficient documentation

## 2021-12-22 DIAGNOSIS — Z01812 Encounter for preprocedural laboratory examination: Secondary | ICD-10-CM

## 2021-12-22 HISTORY — DX: Prediabetes: R73.03

## 2021-12-22 LAB — CBC WITH DIFFERENTIAL/PLATELET
Abs Immature Granulocytes: 0.02 10*3/uL (ref 0.00–0.07)
Basophils Absolute: 0 10*3/uL (ref 0.0–0.1)
Basophils Relative: 1 %
Eosinophils Absolute: 0.2 10*3/uL (ref 0.0–0.5)
Eosinophils Relative: 2 %
HCT: 40.5 % (ref 36.0–46.0)
Hemoglobin: 13.7 g/dL (ref 12.0–15.0)
Immature Granulocytes: 0 %
Lymphocytes Relative: 25 %
Lymphs Abs: 2.1 10*3/uL (ref 0.7–4.0)
MCH: 31.3 pg (ref 26.0–34.0)
MCHC: 33.8 g/dL (ref 30.0–36.0)
MCV: 92.5 fL (ref 80.0–100.0)
Monocytes Absolute: 0.7 10*3/uL (ref 0.1–1.0)
Monocytes Relative: 8 %
Neutro Abs: 5.4 10*3/uL (ref 1.7–7.7)
Neutrophils Relative %: 64 %
Platelets: 328 10*3/uL (ref 150–400)
RBC: 4.38 MIL/uL (ref 3.87–5.11)
RDW: 12.2 % (ref 11.5–15.5)
WBC: 8.3 10*3/uL (ref 4.0–10.5)
nRBC: 0 % (ref 0.0–0.2)

## 2021-12-22 LAB — COMPREHENSIVE METABOLIC PANEL
ALT: 13 U/L (ref 0–44)
AST: 19 U/L (ref 15–41)
Albumin: 4.3 g/dL (ref 3.5–5.0)
Alkaline Phosphatase: 76 U/L (ref 38–126)
Anion gap: 11 (ref 5–15)
BUN: 13 mg/dL (ref 8–23)
CO2: 28 mmol/L (ref 22–32)
Calcium: 9.1 mg/dL (ref 8.9–10.3)
Chloride: 101 mmol/L (ref 98–111)
Creatinine, Ser: 0.86 mg/dL (ref 0.44–1.00)
GFR, Estimated: >60 mL/min (ref >60)
Glucose, Bld: 84 mg/dL (ref 70–99)
Potassium: 3.7 mmol/L (ref 3.5–5.1)
Sodium: 140 mmol/L (ref 135–145)
Total Bilirubin: 0.9 mg/dL (ref 0.3–1.2)
Total Protein: 7 g/dL (ref 6.5–8.1)

## 2021-12-22 LAB — URINALYSIS, ROUTINE W REFLEX MICROSCOPIC
Bilirubin Urine: NEGATIVE
Glucose, UA: NEGATIVE mg/dL
Hgb urine dipstick: NEGATIVE
Ketones, ur: NEGATIVE mg/dL
Nitrite: POSITIVE — AB
Protein, ur: NEGATIVE mg/dL
Specific Gravity, Urine: 1.006 (ref 1.005–1.030)
Squamous Epithelial / HPF: NONE SEEN (ref 0–5)
pH: 6 (ref 5.0–8.0)

## 2021-12-22 LAB — SURGICAL PCR SCREEN
MRSA, PCR: NEGATIVE
Staphylococcus aureus: POSITIVE — AB

## 2021-12-22 LAB — TYPE AND SCREEN
ABO/RH(D): O POS
Antibody Screen: NEGATIVE

## 2021-12-22 NOTE — Patient Instructions (Signed)
Your procedure is scheduled on: Tuesday January 02, 2022. Report to Day Surgery inside [facility] 2nd floor, stop by admissions desk before getting on elevator.  To find out your arrival time please call [phone] between 1PM - 3PM on Monday January 01, 2022.  Remember: Instructions that are not followed completely may result in serious medical risk,  up to and including death, or upon the discretion of your surgeon and anesthesiologist your  surgery may need to be rescheduled.     _X__ 1. Do not eat food after midnight the night before your procedure.                 No chewing gum or hard candies. You may drink clear liquids up to 2 hours                 before you are scheduled to arrive for your surgery- DO not drink clear                 liquids within 2 hours of the start of your surgery.                 Clear Liquids include:  water, apple juice without pulp, clear Gatorade, G2 or                  Gatorade Zero (avoid Red/Purple/Blue), Black Coffee or Tea (Do not add                 anything to coffee or tea).   __X__2.   Complete the "Ensure Clear Pre-surgery Clear Carbohydrate Drink" provided to you, 2 hours before arrival. **If you are diabetic you will be provided with an alternative drink, Gatorade Zero or G2.  __X__3.  On the morning of surgery brush your teeth with toothpaste and water, you                may rinse your mouth with mouthwash if you wish.  Do not swallow any toothpaste of mouthwash.     _X__ 4.  No Alcohol for 24 hours before or after surgery.   _X__ 5.  Do Not Smoke or use e-cigarettes For 24 Hours Prior to Your Surgery.                 Do not use any chewable tobacco products for at least 6 hours prior to                 Surgery.  _X__  6.  Do not use any recreational drugs (marijuana, cocaine, heroin, ecstasy, MDMA or other)                For at least one week prior to your surgery.  Combination of these drugs with  anesthesia                May have life threatening results.  ____  7.  Bring all medications with you on the day of surgery if instructed.   __X__ 8.  Notify your doctor if there is any change in your medical condition      (cold, fever, infections).     Do not wear jewelry, make-up, hairpins, clips or nail polish. Do not wear lotions, powders, or perfumes. You may wear deodorant. Do not shave 48 hours prior to surgery. Men may shave face and neck. Do not bring valuables to the hospital.    [facility] is not responsible for any belongings or valuables.  Contacts, dentures or bridgework may not be worn into surgery. Leave your suitcase in the car. After surgery it may be brought to your room. For patients admitted to the hospital, discharge time is determined by your treatment team.   Patients discharged the day of surgery will not be allowed to drive home.   Make arrangements for someone to be with you for the first 24 hours of your Same Day Discharge.   __x__ Take these medicines the morning of surgery with A SIP OF WATER:    1. buPROPion (WELLBUTRIN XL) 300 MG   2. gabapentin (NEURONTIN) 400 MG  3. DEXILANT 60 MG  4. chlorpheniramine (CHLOR-TRIMETON) 4 MG  5. levothyroxine (SYNTHROID) 125 MCG  6. rosuvastatin (CRESTOR) 20 MG  7. NALTREXONE HCL  ____ Fleet Enema (as directed)   __X__ Use CHG Soap (or wipes) as directed  ____ Use Benzoyl Peroxide Gel as instructed  ____ Use inhalers on the day of surgery  ____ Stop metformin 2 days prior to surgery    ____ Take 1/2 of usual insulin dose the night before surgery. No insulin the morning          of surgery.   ____ Call your PCP, cardiologist, or Pulmonologist if taking Coumadin/Plavix/aspirin and ask when to stop before your surgery.   __X__ One Week prior to surgery- Stop Anti-inflammatories such as Ibuprofen, Aleve, Advil, Motrin, meloxicam (MOBIC), diclofenac, etodolac, ketorolac, Toradol, Daypro, piroxicam,  Goody's or BC powders. OK TO USE TYLENOL IF NEEDED   __X__ Stop supplements until after surgery.    ____ Bring C-Pap to the hospital.    If you have any questions regarding your pre-procedure instructions,  Please call Pre-admit Testing at [phone]

## 2021-12-24 ENCOUNTER — Encounter: Payer: Self-pay | Admitting: Orthopedic Surgery

## 2021-12-25 DIAGNOSIS — N390 Urinary tract infection, site not specified: Secondary | ICD-10-CM

## 2021-12-25 DIAGNOSIS — Z01812 Encounter for preprocedural laboratory examination: Secondary | ICD-10-CM

## 2021-12-25 LAB — URINE CULTURE: Culture: >=100000 — AB

## 2021-12-25 MED ORDER — SULFAMETHOXAZOLE-TRIMETHOPRIM 800-160 MG PO TABS: 1.0000 | ORAL_TABLET | Freq: Two times a day (BID) | ORAL | 0 refills | Status: AC

## 2021-12-25 MED ORDER — SULFAMETHOXAZOLE-TRIMETHOPRIM 800-160 MG PO TABS
1.0000 | ORAL_TABLET | Freq: Two times a day (BID) | ORAL | 0 refills | Status: DC
Start: 2021-12-25 — End: 2021-12-25

## 2021-12-25 NOTE — Addendum Note (Signed)
Addended by: Honor Loh on: 12/25/2021 08:43 AM   Modules accepted: Orders

## 2021-12-25 NOTE — Progress Notes (Signed)
°  [facility] Perioperative Services: Pre-Admission/Anesthesia Testing  Abnormal Lab Notification and Treatment Plan of Care   Date: 12/25/21  Name: Evelyn Bentley MRN:   [id number]  Re: Abnormal labs noted during PAT appointment   Notified:  Provider Name Provider Role Notification Mode  Hessie Knows, MD Orthopedics Routed and/or faxed via [facility]   Abnormal Lab Value(s):   Lab Results  Component Value Date   COLORURINE YELLOW (A) 12/22/2021   APPEARANCEUR HAZY (A) 12/22/2021   LABSPEC 1.006 12/22/2021   PHURINE 6.0 12/22/2021   GLUCOSEU NEGATIVE 12/22/2021   HGBUR NEGATIVE 12/22/2021   BILIRUBINUR NEGATIVE 12/22/2021   KETONESUR NEGATIVE 12/22/2021   PROTEINUR NEGATIVE 12/22/2021   NITRITE POSITIVE (A) 12/22/2021   LEUKOCYTESUR TRACE (A) 12/22/2021   EPIU NONE SEEN 12/22/2021   WBCU 6-10 12/22/2021   RBCU 0-5 12/22/2021   BACTERIA MANY (A) 12/22/2021   CULT >=100,000 COLONIES/mL ESCHERICHIA COLI (A) 12/22/2021    Clinical Information and Notes:  Patient is scheduled for a RIGHT TOTAL HIP ARTHROPLASTY on 01/02/2022.    UA performed in PAT consistent with/concerning for infection.  No leukocytosis noted on CBC; WBC 8300 Renal function: Estimated Creatinine Clearance: 56.7 mL/min (by C-G formula based on SCr of 0.86 mg/dL). Urine C&S added to assess for pathogenically significant growth.  Impression and Plan:  Evelyn Bentley with a UA that was (+) for infection; reflex culture sent. Contacted patient to discuss. Patient reporting that she is experiencing minimal symptoms. Suspect early infection as urine is both nitrite and PE (+). Patient with surgery scheduled soon. In efforts to avoid delaying patient's procedure, or have her experience any potentially significant perioperative complications related to the aforementioned, I would like to proceed with empiric treatment for urinary tract infection.  Allergies reviewed. Culture report also reviewed  to ensure culture appropriate coverage is being provided. Will treat with a 3 day course of SMZ-TMP DS. Patient encouraged to complete the entire course of antibiotics even if she begins to feel better.   Meds ordered this encounter  Medications   sulfamethoxazole-trimethoprim (BACTRIM DS) 800-160 MG tablet    Sig: Take 1 tablet by mouth 2 (two) times daily for 3 days. Increase WATER intake while taking this medication.    Dispense:  6 tablet    Refill:  0   Patient encouraged to increase her fluid intake as much as possible. Discussed that water is always best to flush the urinary tract. She was advised to avoid caffeine containing fluids until her infections clears, as caffeine can cause her to experience painful bladder spasms.   May use Tylenol as needed for pain/fever should she experience these symptoms.   Patient instructed to call surgeon's office or PAT with any questions or concerns related to the above outlined course of treatment. Additionally, she was instructed to call if she feels like she is getting worse overall while on treatment. Results and treatment plan of care forwarded to primary attending surgeon to make them aware.   Honor Loh, MSN, APRN, FNP-C, CEN [facility]  Peri-operative Services Nurse Practitioner Phone: [phone] Fax: [phone] 12/25/21 8:36 AM

## 2021-12-29 ENCOUNTER — Other Ambulatory Visit
Admission: RE | Admit: 2021-12-29 | Discharge: 2021-12-29 | Disposition: A | Discharge status: Home / Self Care | Payer: BC Managed Care – PPO | Source: Ambulatory Visit | Attending: Orthopedic Surgery | Admitting: Orthopedic Surgery

## 2021-12-29 DIAGNOSIS — Z01812 Encounter for preprocedural laboratory examination: Secondary | ICD-10-CM | POA: Insufficient documentation

## 2021-12-29 DIAGNOSIS — Z20822 Contact with and (suspected) exposure to covid-19: Secondary | ICD-10-CM | POA: Diagnosis not present

## 2021-12-29 LAB — SARS CORONAVIRUS 2 (TAT 6-24 HRS): SARS Coronavirus 2: NEGATIVE

## 2022-01-02 ENCOUNTER — Ambulatory Visit: Payer: BC Managed Care – PPO

## 2022-01-02 ENCOUNTER — Observation Stay
Admission: RE | Admit: 2022-01-02 | Discharge: 2022-01-03 | Disposition: A | Discharge status: Home Health | Payer: BC Managed Care – PPO | Attending: Orthopedic Surgery | Admitting: Orthopedic Surgery

## 2022-01-02 ENCOUNTER — Observation Stay: Payer: BC Managed Care – PPO

## 2022-01-02 ENCOUNTER — Encounter: Payer: Self-pay | Admitting: Orthopedic Surgery

## 2022-01-02 ENCOUNTER — Other Ambulatory Visit: Payer: Self-pay

## 2022-01-02 ENCOUNTER — Encounter
Admission: RE | Disposition: A | Discharge status: Home Health | Payer: Self-pay | Source: Home / Self Care | Attending: Orthopedic Surgery

## 2022-01-02 ENCOUNTER — Ambulatory Visit: Payer: BC Managed Care – PPO | Admitting: Urgent Care

## 2022-01-02 DIAGNOSIS — Z96641 Presence of right artificial hip joint: Secondary | ICD-10-CM

## 2022-01-02 DIAGNOSIS — I10 Essential (primary) hypertension: Secondary | ICD-10-CM | POA: Diagnosis not present

## 2022-01-02 DIAGNOSIS — M1611 Unilateral primary osteoarthritis, right hip: Principal | ICD-10-CM | POA: Insufficient documentation

## 2022-01-02 DIAGNOSIS — Z419 Encounter for procedure for purposes other than remedying health state, unspecified: Secondary | ICD-10-CM

## 2022-01-02 DIAGNOSIS — E039 Hypothyroidism, unspecified: Secondary | ICD-10-CM | POA: Diagnosis not present

## 2022-01-02 DIAGNOSIS — Z79899 Other long term (current) drug therapy: Secondary | ICD-10-CM | POA: Diagnosis not present

## 2022-01-02 DIAGNOSIS — G8918 Other acute postprocedural pain: Secondary | ICD-10-CM

## 2022-01-02 HISTORY — DX: Presence of right artificial hip joint: Z96.641

## 2022-01-02 HISTORY — PX: TOTAL HIP ARTHROPLASTY: SHX124

## 2022-01-02 LAB — CBC
HCT: 38.8 % (ref 36.0–46.0)
Hemoglobin: 13.1 g/dL (ref 12.0–15.0)
MCH: 30.8 pg (ref 26.0–34.0)
MCHC: 33.8 g/dL (ref 30.0–36.0)
MCV: 91.1 fL (ref 80.0–100.0)
Platelets: 287 10*3/uL (ref 150–400)
RBC: 4.26 MIL/uL (ref 3.87–5.11)
RDW: 11.9 % (ref 11.5–15.5)
WBC: 14.4 10*3/uL — ABNORMAL HIGH (ref 4.0–10.5)
nRBC: 0 % (ref 0.0–0.2)

## 2022-01-02 LAB — GLUCOSE, CAPILLARY
Glucose-Capillary: 205 mg/dL — ABNORMAL HIGH (ref 70–99)
Glucose-Capillary: 158 mg/dL — ABNORMAL HIGH (ref 70–99)

## 2022-01-02 LAB — CREATININE, SERUM
Creatinine, Ser: 0.86 mg/dL (ref 0.44–1.00)
GFR, Estimated: >60 mL/min (ref >60)

## 2022-01-02 SURGERY — ARTHROPLASTY, HIP, TOTAL, ANTERIOR APPROACH
Anesthesia: Spinal | Site: Hip | Laterality: Right

## 2022-01-02 MED ORDER — CEFAZOLIN SODIUM-DEXTROSE 2-4 GM/100ML-% IV SOLN
2.0000 g | INTRAVENOUS | Status: AC
  Administered 2022-01-02: 2 g via INTRAVENOUS

## 2022-01-02 MED ORDER — LEVOTHYROXINE SODIUM 125 MCG PO TABS
125.0000 ug | ORAL_TABLET | Freq: Every day | ORAL | Status: DC
  Administered 2022-01-03: 125 ug via ORAL
  Filled 2022-01-02: qty 1

## 2022-01-02 MED ORDER — SODIUM CHLORIDE 0.9 % IR SOLN: Status: DC | PRN

## 2022-01-02 MED ORDER — FENTANYL CITRATE (PF) 100 MCG/2ML IJ SOLN
INTRAMUSCULAR | Status: AC
  Filled 2022-01-02: qty 2

## 2022-01-02 MED ORDER — BUPIVACAINE HCL (PF) 0.5 % IJ SOLN
INTRAMUSCULAR | Status: DC | PRN
  Administered 2022-01-02: 1.5 mL via INTRATHECAL

## 2022-01-02 MED ORDER — ONDANSETRON HCL 4 MG PO TABS: 4.0000 mg | ORAL_TABLET | Freq: Four times a day (QID) | ORAL | Status: DC | PRN

## 2022-01-02 MED ORDER — LACTATED RINGERS IV SOLN
INTRAVENOUS | Status: DC
Start: 2022-01-02 — End: 2022-01-02

## 2022-01-02 MED ORDER — MORPHINE SULFATE (PF) 2 MG/ML IV SOLN
INTRAVENOUS | Status: AC
  Filled 2022-01-02: qty 1

## 2022-01-02 MED ORDER — SODIUM CHLORIDE FLUSH 0.9 % IV SOLN
INTRAVENOUS | Status: AC
  Filled 2022-01-02: qty 80

## 2022-01-02 MED ORDER — PROPOFOL 500 MG/50ML IV EMUL
INTRAVENOUS | Status: DC | PRN
  Administered 2022-01-02: 100 ug/kg/min via INTRAVENOUS

## 2022-01-02 MED ORDER — MEPERIDINE HCL 50 MG PO TABS
50.0000 mg | ORAL_TABLET | ORAL | Status: DC | PRN
  Administered 2022-01-02 – 2022-01-03 (×3): 50 mg via ORAL
  Filled 2022-01-02 (×5): qty 1

## 2022-01-02 MED ORDER — PROPOFOL 1000 MG/100ML IV EMUL
INTRAVENOUS | Status: AC
  Filled 2022-01-02: qty 100

## 2022-01-02 MED ORDER — NORTRIPTYLINE HCL 25 MG PO CAPS
25.0000 mg | ORAL_CAPSULE | Freq: Every day | ORAL | Status: DC
  Administered 2022-01-02: 25 mg via ORAL
  Filled 2022-01-02 (×2): qty 1

## 2022-01-02 MED ORDER — CEFAZOLIN SODIUM-DEXTROSE 2-4 GM/100ML-% IV SOLN
INTRAVENOUS | Status: AC
  Filled 2022-01-02: qty 100

## 2022-01-02 MED ORDER — SODIUM CHLORIDE 0.9 % IV SOLN: INTRAVENOUS | Status: DC

## 2022-01-02 MED ORDER — GABAPENTIN 300 MG PO CAPS
300.0000 mg | ORAL_CAPSULE | Freq: Every morning | ORAL | Status: DC
Start: 2022-01-03 — End: 2022-01-03

## 2022-01-02 MED ORDER — GABAPENTIN 400 MG PO CAPS
400.0000 mg | ORAL_CAPSULE | Freq: Two times a day (BID) | ORAL | Status: DC
  Administered 2022-01-02 – 2022-01-03 (×3): 400 mg via ORAL
  Filled 2022-01-02 (×3): qty 1

## 2022-01-02 MED ORDER — BUPIVACAINE HCL (PF) 0.5 % IJ SOLN
INTRAMUSCULAR | Status: AC
  Filled 2022-01-02: qty 10

## 2022-01-02 MED ORDER — APREPITANT 40 MG PO CAPS
40.0000 mg | ORAL_CAPSULE | Freq: Once | ORAL | Status: AC
Start: 2022-01-02 — End: 2022-01-02

## 2022-01-02 MED ORDER — METOCLOPRAMIDE HCL 5 MG PO TABS
5.0000 mg | ORAL_TABLET | Freq: Three times a day (TID) | ORAL | Status: DC | PRN
  Filled 2022-01-02: qty 2

## 2022-01-02 MED ORDER — ALUM & MAG HYDROXIDE-SIMETH 200-200-20 MG/5ML PO SUSP: 30.0000 mL | ORAL | Status: DC | PRN

## 2022-01-02 MED ORDER — MIDAZOLAM HCL 5 MG/5ML IJ SOLN
INTRAMUSCULAR | Status: DC | PRN
Start: 2022-01-02 — End: 2022-01-02
  Administered 2022-01-02: 2 mg via INTRAVENOUS

## 2022-01-02 MED ORDER — AZELASTINE HCL 0.1 % NA SOLN
1.0000 | Freq: Two times a day (BID) | NASAL | Status: DC | PRN
  Filled 2022-01-02: qty 30

## 2022-01-02 MED ORDER — ONDANSETRON HCL 4 MG/2ML IJ SOLN
INTRAMUSCULAR | Status: AC
  Filled 2022-01-02: qty 2

## 2022-01-02 MED ORDER — HYDROXYZINE HCL 10 MG PO TABS
10.0000 mg | ORAL_TABLET | Freq: Two times a day (BID) | ORAL | Status: DC | PRN
  Filled 2022-01-02: qty 2

## 2022-01-02 MED ORDER — PROPOFOL 10 MG/ML IV BOLUS
INTRAVENOUS | Status: DC | PRN
  Administered 2022-01-02: 30 mg via INTRAVENOUS

## 2022-01-02 MED ORDER — PROPOFOL 10 MG/ML IV BOLUS
INTRAVENOUS | Status: AC
  Filled 2022-01-02: qty 20

## 2022-01-02 MED ORDER — FENTANYL CITRATE (PF) 100 MCG/2ML IJ SOLN
INTRAMUSCULAR | Status: AC
  Administered 2022-01-02: 25 ug via INTRAVENOUS
  Filled 2022-01-02: qty 2

## 2022-01-02 MED ORDER — POLYETHYLENE GLYCOL 3350 17 G PO PACK
17.0000 g | PACK | Freq: Every day | ORAL | Status: DC | PRN
  Administered 2022-01-03: 17 g via ORAL
  Filled 2022-01-02 (×2): qty 1

## 2022-01-02 MED ORDER — ZOLPIDEM TARTRATE 5 MG PO TABS: 5.0000 mg | ORAL_TABLET | Freq: Every evening | ORAL | Status: DC | PRN

## 2022-01-02 MED ORDER — LISINOPRIL-HYDROCHLOROTHIAZIDE 10-12.5 MG PO TABS: 1.0000 | ORAL_TABLET | Freq: Every day | ORAL | Status: DC

## 2022-01-02 MED ORDER — FENTANYL CITRATE (PF) 100 MCG/2ML IJ SOLN
25.0000 ug | INTRAMUSCULAR | Status: DC | PRN
  Administered 2022-01-02 (×5): 25 ug via INTRAVENOUS

## 2022-01-02 MED ORDER — ONDANSETRON HCL 4 MG/2ML IJ SOLN: 4.0000 mg | Freq: Four times a day (QID) | INTRAMUSCULAR | Status: DC | PRN

## 2022-01-02 MED ORDER — METHOCARBAMOL 500 MG PO TABS
ORAL_TABLET | ORAL | Status: AC
  Administered 2022-01-02: 500 mg via ORAL
  Filled 2022-01-02: qty 1

## 2022-01-02 MED ORDER — PHENYLEPHRINE HCL (PRESSORS) 10 MG/ML IV SOLN
INTRAVENOUS | Status: DC | PRN
Start: 2022-01-02 — End: 2022-01-02
  Administered 2022-01-02 (×2): 120 ug via INTRAVENOUS
  Administered 2022-01-02: 80 ug via INTRAVENOUS

## 2022-01-02 MED ORDER — HYDROCHLOROTHIAZIDE 12.5 MG PO TABS
12.5000 mg | ORAL_TABLET | Freq: Every day | ORAL | Status: DC
Start: 2022-01-03 — End: 2022-01-03
  Administered 2022-01-03: 12.5 mg via ORAL
  Filled 2022-01-02: qty 1

## 2022-01-02 MED ORDER — ONDANSETRON HCL 4 MG/2ML IJ SOLN: 4.0000 mg | Freq: Once | INTRAMUSCULAR | Status: DC | PRN

## 2022-01-02 MED ORDER — PANTOPRAZOLE SODIUM 40 MG PO TBEC
40.0000 mg | DELAYED_RELEASE_TABLET | Freq: Every day | ORAL | Status: DC
  Administered 2022-01-02 – 2022-01-03 (×2): 40 mg via ORAL
  Filled 2022-01-02 (×2): qty 1

## 2022-01-02 MED ORDER — FLEET ENEMA 7-19 GM/118ML RE ENEM: 1.0000 | ENEMA | Freq: Once | RECTAL | Status: DC | PRN

## 2022-01-02 MED ORDER — SODIUM CHLORIDE (PF) 0.9 % IJ SOLN
INTRAMUSCULAR | Status: DC | PRN
  Administered 2022-01-02: 90 mL via INTRAMUSCULAR

## 2022-01-02 MED ORDER — HEMOSTATIC AGENTS (NO CHARGE) OPTIME
TOPICAL | Status: DC | PRN
  Administered 2022-01-02: 2

## 2022-01-02 MED ORDER — ORAL CARE MOUTH RINSE: 15.0000 mL | Freq: Once | OROMUCOSAL | Status: AC

## 2022-01-02 MED ORDER — POLYVINYL ALCOHOL 1.4 % OP SOLN
1.0000 [drp] | OPHTHALMIC | Status: DC | PRN
  Filled 2022-01-02: qty 15

## 2022-01-02 MED ORDER — CHLORHEXIDINE GLUCONATE 0.12 % MT SOLN: 15.0000 mL | Freq: Once | OROMUCOSAL | Status: AC

## 2022-01-02 MED ORDER — BISACODYL 5 MG PO TBEC
5.0000 mg | DELAYED_RELEASE_TABLET | Freq: Every day | ORAL | Status: DC | PRN
  Filled 2022-01-02: qty 1

## 2022-01-02 MED ORDER — MENTHOL 3 MG MT LOZG
1.0000 | LOZENGE | OROMUCOSAL | Status: DC | PRN
  Filled 2022-01-02: qty 9

## 2022-01-02 MED ORDER — METHOCARBAMOL 500 MG PO TABS
500.0000 mg | ORAL_TABLET | Freq: Four times a day (QID) | ORAL | Status: DC | PRN
  Administered 2022-01-02 – 2022-01-03 (×3): 500 mg via ORAL
  Filled 2022-01-02 (×3): qty 1

## 2022-01-02 MED ORDER — BUPIVACAINE HCL (PF) 0.25 % IJ SOLN
INTRAMUSCULAR | Status: AC
  Filled 2022-01-02: qty 60

## 2022-01-02 MED ORDER — METHOCARBAMOL 1000 MG/10ML IJ SOLN
500.0000 mg | Freq: Four times a day (QID) | INTRAVENOUS | Status: DC | PRN
  Filled 2022-01-02: qty 5

## 2022-01-02 MED ORDER — ROSUVASTATIN CALCIUM 20 MG PO TABS
20.0000 mg | ORAL_TABLET | Freq: Every day | ORAL | Status: DC
Start: 2022-01-02 — End: 2022-01-03
  Administered 2022-01-02 – 2022-01-03 (×2): 20 mg via ORAL
  Filled 2022-01-02 (×2): qty 1

## 2022-01-02 MED ORDER — DEXAMETHASONE SODIUM PHOSPHATE 10 MG/ML IJ SOLN
INTRAMUSCULAR | Status: AC
  Filled 2022-01-02: qty 1

## 2022-01-02 MED ORDER — BUPIVACAINE LIPOSOME 1.3 % IJ SUSP
INTRAMUSCULAR | Status: AC
  Filled 2022-01-02: qty 40

## 2022-01-02 MED ORDER — MORPHINE SULFATE (PF) 2 MG/ML IV SOLN
2.0000 mg | INTRAVENOUS | Status: DC | PRN
  Administered 2022-01-02 – 2022-01-03 (×2): 2 mg via INTRAVENOUS
  Filled 2022-01-02 (×2): qty 1

## 2022-01-02 MED ORDER — GLYCOPYRROLATE 0.2 MG/ML IJ SOLN
INTRAMUSCULAR | Status: AC
  Filled 2022-01-02: qty 1

## 2022-01-02 MED ORDER — BUPROPION HCL ER (XL) 300 MG PO TB24
300.0000 mg | ORAL_TABLET | Freq: Every day | ORAL | Status: DC
  Administered 2022-01-02 – 2022-01-03 (×2): 300 mg via ORAL
  Filled 2022-01-02 (×2): qty 1

## 2022-01-02 MED ORDER — METOCLOPRAMIDE HCL 5 MG/ML IJ SOLN: 5.0000 mg | Freq: Three times a day (TID) | INTRAMUSCULAR | Status: DC | PRN

## 2022-01-02 MED ORDER — APREPITANT 40 MG PO CAPS
ORAL_CAPSULE | ORAL | Status: AC
Start: 2022-01-02 — End: 2022-01-02
  Administered 2022-01-02: 40 mg via ORAL
  Filled 2022-01-02: qty 1

## 2022-01-02 MED ORDER — LISINOPRIL 10 MG PO TABS
10.0000 mg | ORAL_TABLET | Freq: Every morning | ORAL | Status: DC
Start: 2022-01-03 — End: 2022-01-03
  Administered 2022-01-03: 10 mg via ORAL
  Filled 2022-01-02: qty 1

## 2022-01-02 MED ORDER — PHENYLEPHRINE HCL-NACL 20-0.9 MG/250ML-% IV SOLN
INTRAVENOUS | Status: AC
  Filled 2022-01-02: qty 250

## 2022-01-02 MED ORDER — BUSPIRONE HCL 10 MG PO TABS
10.0000 mg | ORAL_TABLET | Freq: Every day | ORAL | Status: DC | PRN
  Filled 2022-01-02: qty 1

## 2022-01-02 MED ORDER — ADULT MULTIVITAMIN W/MINERALS CH
1.0000 | ORAL_TABLET | Freq: Every day | ORAL | Status: DC
  Administered 2022-01-02 – 2022-01-03 (×2): 1 via ORAL
  Filled 2022-01-02 (×4): qty 1

## 2022-01-02 MED ORDER — FENTANYL CITRATE (PF) 100 MCG/2ML IJ SOLN
INTRAMUSCULAR | Status: DC | PRN
Start: 2022-01-02 — End: 2022-01-02
  Administered 2022-01-02: 50 ug via INTRAVENOUS

## 2022-01-02 MED ORDER — PHENOL 1.4 % MT LIQD
1.0000 | OROMUCOSAL | Status: DC | PRN
  Filled 2022-01-02: qty 177

## 2022-01-02 MED ORDER — VITAMIN D (ERGOCALCIFEROL) 1.25 MG (50000 UNIT) PO CAPS: 50000.0000 [IU] | ORAL_CAPSULE | ORAL | Status: DC

## 2022-01-02 MED ORDER — FLUTICASONE PROPIONATE 50 MCG/ACT NA SUSP
1.0000 | Freq: Every day | NASAL | Status: DC | PRN
  Filled 2022-01-02: qty 16

## 2022-01-02 MED ORDER — DEXAMETHASONE SODIUM PHOSPHATE 10 MG/ML IJ SOLN
INTRAMUSCULAR | Status: DC | PRN
  Administered 2022-01-02: 10 mg via INTRAVENOUS

## 2022-01-02 MED ORDER — DOCUSATE SODIUM 100 MG PO CAPS
100.0000 mg | ORAL_CAPSULE | Freq: Two times a day (BID) | ORAL | Status: DC
  Administered 2022-01-02 – 2022-01-03 (×3): 100 mg via ORAL
  Filled 2022-01-02 (×3): qty 1

## 2022-01-02 MED ORDER — PHENYLEPHRINE HCL (PRESSORS) 10 MG/ML IV SOLN
INTRAVENOUS | Status: AC
  Filled 2022-01-02: qty 1

## 2022-01-02 MED ORDER — ENOXAPARIN SODIUM 40 MG/0.4ML IJ SOSY
40.0000 mg | PREFILLED_SYRINGE | INTRAMUSCULAR | Status: DC
  Administered 2022-01-03: 40 mg via SUBCUTANEOUS
  Filled 2022-01-02: qty 0.4

## 2022-01-02 MED ORDER — PHENYLEPHRINE HCL-NACL 20-0.9 MG/250ML-% IV SOLN
INTRAVENOUS | Status: DC | PRN
  Administered 2022-01-02: 50 ug/min via INTRAVENOUS

## 2022-01-02 MED ORDER — MIDAZOLAM HCL 2 MG/2ML IJ SOLN
INTRAMUSCULAR | Status: AC
  Filled 2022-01-02: qty 2

## 2022-01-02 MED ORDER — CEFAZOLIN SODIUM-DEXTROSE 2-4 GM/100ML-% IV SOLN
2.0000 g | Freq: Four times a day (QID) | INTRAVENOUS | Status: AC
  Administered 2022-01-02 (×2): 2 g via INTRAVENOUS
  Filled 2022-01-02 (×2): qty 100

## 2022-01-02 MED ORDER — DIPHENHYDRAMINE HCL 25 MG PO TABS
25.0000 mg | ORAL_TABLET | Freq: Four times a day (QID) | ORAL | Status: DC
  Administered 2022-01-03: 25 mg via ORAL
  Filled 2022-01-02 (×9): qty 1

## 2022-01-02 MED ORDER — NEOMYCIN-POLYMYXIN B GU 40-200000 IR SOLN
Status: AC
  Filled 2022-01-02: qty 8

## 2022-01-02 MED ORDER — CHLORHEXIDINE GLUCONATE 0.12 % MT SOLN
OROMUCOSAL | Status: AC
  Administered 2022-01-02: 15 mL via OROMUCOSAL
  Filled 2022-01-02: qty 15

## 2022-01-02 SURGICAL SUPPLY — 62 items
APL PRP STRL LF DISP 70% ISPRP (MISCELLANEOUS) ×1
BLADE SAGITTAL AGGR TOOTH XLG (BLADE) ×3 IMPLANT
BNDG COHESIVE 6X5 TAN ST LF (GAUZE/BANDAGES/DRESSINGS) ×9 IMPLANT
CANISTER WOUND CARE 500ML ATS (WOUND CARE) ×3 IMPLANT
CHLORAPREP W/TINT 26 (MISCELLANEOUS) ×3 IMPLANT
COVER BACK TABLE REUSABLE LG (DRAPES) ×3 IMPLANT
DRAPE 3/4 80X56 (DRAPES) ×8 IMPLANT
DRAPE C-ARM XRAY 36X54 (DRAPES) ×3 IMPLANT
DRAPE INCISE IOBAN 66X60 STRL (DRAPES) IMPLANT
DRAPE POUCH INSTRU U-SHP 10X18 (DRAPES) ×3 IMPLANT
DRESSING SURGICEL FIBRLLR 1X2 (HEMOSTASIS) ×4 IMPLANT
DRSG MEPILEX SACRM 8.7X9.8 (GAUZE/BANDAGES/DRESSINGS) ×3 IMPLANT
DRSG OPSITE POSTOP 4X8 (GAUZE/BANDAGES/DRESSINGS) ×4 IMPLANT
DRSG SURGICEL FIBRILLAR 1X2 (HEMOSTASIS) ×4
ELECT BLADE 6.5 EXT (BLADE) ×3 IMPLANT
ELECT REM PT RETURN 9FT ADLT (ELECTROSURGICAL) ×2
ELECTRODE REM PT RTRN 9FT ADLT (ELECTROSURGICAL) ×2 IMPLANT
GLOVE SURG SYN 9.0  PF PI (GLOVE) ×4
GLOVE SURG SYN 9.0 PF PI (GLOVE) ×4 IMPLANT
GLOVE SURG UNDER POLY LF SZ9 (GLOVE) ×3 IMPLANT
GOWN SRG 2XL LVL 4 RGLN SLV (GOWNS) ×2 IMPLANT
GOWN STRL NON-REIN 2XL LVL4 (GOWNS) ×2
GOWN STRL REUS W/ TWL LRG LVL3 (GOWN DISPOSABLE) ×2 IMPLANT
GOWN STRL REUS W/TWL LRG LVL3 (GOWN DISPOSABLE) ×2
HEAD FEMORAL 28MM SZ S (Head) ×1 IMPLANT
HEMOVAC 400CC 10FR (MISCELLANEOUS) IMPLANT
HOLDER FOLEY CATH W/STRAP (MISCELLANEOUS) ×3 IMPLANT
KIT PREVENA INCISION MGT 13 (CANNISTER) ×3 IMPLANT
LINER DUAL MOB 50MM (Liner) ×1 IMPLANT
MANIFOLD NEPTUNE II (INSTRUMENTS) ×3 IMPLANT
MAT ABSORB  FLUID 56X50 GRAY (MISCELLANEOUS) ×2
MAT ABSORB FLUID 56X50 GRAY (MISCELLANEOUS) ×2 IMPLANT
NDL SAFETY ECLIPSE 18X1.5 (NEEDLE) ×2 IMPLANT
NDL SPNL 20GX3.5 QUINCKE YW (NEEDLE) ×4 IMPLANT
NEEDLE HYPO 18GX1.5 SHARP (NEEDLE)
NEEDLE SPNL 20GX3.5 QUINCKE YW (NEEDLE) ×4 IMPLANT
NS IRRIG 1000ML POUR BTL (IV SOLUTION) ×3 IMPLANT
PACK HIP COMPR (MISCELLANEOUS) ×3 IMPLANT
SCALPEL PROTECTED #10 DISP (BLADE) ×6 IMPLANT
SHELL ACETABULAR SZ0 50 DME (Shell) ×1 IMPLANT
SOL PREP PVP 2OZ (MISCELLANEOUS)
SOLUTION PREP PVP 2OZ (MISCELLANEOUS) ×2 IMPLANT
SOLUTION PRONTOSAN WOUND 350ML (IRRIGATION / IRRIGATOR) IMPLANT
SPONGE DRAIN TRACH 4X4 STRL 2S (GAUZE/BANDAGES/DRESSINGS) ×2 IMPLANT
STAPLER SKIN PROX 35W (STAPLE) ×3 IMPLANT
STEM FEMORAL 4 STD COLLARED (Stem) ×1 IMPLANT
STRAP SAFETY 5IN WIDE (MISCELLANEOUS) ×2 IMPLANT
SUT DVC 2 QUILL PDO  T11 36X36 (SUTURE) ×2
SUT DVC 2 QUILL PDO T11 36X36 (SUTURE) ×2 IMPLANT
SUT SILK 0 (SUTURE) ×2
SUT SILK 0 30XBRD TIE 6 (SUTURE) ×2 IMPLANT
SUT V-LOC 90 ABS DVC 3-0 CL (SUTURE) ×3 IMPLANT
SUT VIC AB 1 CT1 36 (SUTURE) ×3 IMPLANT
SYR 20ML LL LF (SYRINGE) ×3 IMPLANT
SYR 30ML LL (SYRINGE) ×3 IMPLANT
SYR 50ML LL SCALE MARK (SYRINGE) ×6 IMPLANT
SYR BULB IRRIG 60ML STRL (SYRINGE) ×3 IMPLANT
TAPE MICROFOAM 4IN (TAPE) ×2 IMPLANT
TOWEL OR 17X26 4PK STRL BLUE (TOWEL DISPOSABLE) ×2 IMPLANT
TRAY FOLEY MTR SLVR 16FR STAT (SET/KITS/TRAYS/PACK) ×3 IMPLANT
WATER STERILE IRR 1000ML POUR (IV SOLUTION) ×1 IMPLANT
WATER STERILE IRR 500ML POUR (IV SOLUTION) ×2 IMPLANT

## 2022-01-02 NOTE — Progress Notes (Signed)
Updated pt's daughter that pt still in PACU and waiting for room assignment.  Informed daughter pt is resting, VSS.

## 2022-01-02 NOTE — TOC Progression Note (Signed)
Transition of Care [facility]) - Progression Note    Patient Details  Name: LERONDA LEWERS MRN: [id number] Date of Birth: 04-07-1956  Transition of [facility]) CM/SW [place], RN Phone Number: 01/02/2022, 4:12 PM  Clinical Narrative:   [facility] is not able to staff for [facility] services         Expected Discharge Plan and Services                                                 Social Determinants of Health (SDOH) Interventions    Readmission Risk Interventions No flowsheet data found.

## 2022-01-02 NOTE — Transfer of Care (Signed)
Immediate Anesthesia Transfer of Care Note  Patient: Evelyn Bentley  Procedure(s) Performed: TOTAL HIP ARTHROPLASTY ANTERIOR APPROACH (Right: Hip)  Patient Location: PACU  Anesthesia Type:Spinal  Level of Consciousness: sedated and drowsy  Airway & Oxygen Therapy: Patient Spontanous Breathing and Patient connected to face mask oxygen  Post-op Assessment: Report given to RN and Post -op Vital signs reviewed and unstable, Anesthesiologist notified  Post vital signs: Reviewed and stable  Last Vitals:  Vitals Value Taken Time  BP 122/76 01/02/22 0839  Temp 36.3 C 01/02/22 0840  Pulse 77 01/02/22 0843  Resp 18 01/02/22 0843  SpO2 100 % 01/02/22 0843  Vitals shown include unvalidated device data.  Last Pain:  Vitals:   01/02/22 0618  TempSrc: Temporal  PainSc: 0-No pain         Complications: No notable events documented.

## 2022-01-02 NOTE — Discharge Instructions (Signed)
ANTERIOR APPROACH TOTAL HIP REPLACEMENT POSTOPERATIVE DIRECTIONS   Hip Rehabilitation, Guidelines Following Surgery  The results of a hip operation are greatly improved after range of motion and muscle strengthening exercises. Follow all safety measures which are given to protect your hip. If any of these exercises cause increased pain or swelling in your joint, decrease the amount until you are comfortable again. Then slowly increase the exercises. Call your caregiver if you have problems or questions.   HOME CARE INSTRUCTIONS  Remove items at home which could result in a fall. This includes throw rugs or furniture in walking pathways.  ICE to the affected hip every three hours for 30 minutes at a time and then as needed for pain and swelling.  Continue to use ice on the hip for pain and swelling from surgery. You may notice swelling that will progress down to the foot and ankle.  This is normal after surgery.  Elevate the leg when you are not up walking on it.   Continue to use the breathing machine which will help keep your temperature down.  It is common for your temperature to cycle up and down following surgery, especially at night when you are not up moving around and exerting yourself.  The breathing machine keeps your lungs expanded and your temperature down. Do not place pillow under knee, focus on keeping the knee straight while resting  DIET You may resume your previous home diet once your are discharged from the hospital.  DRESSING / WOUND CARE / SHOWERING Please remove provena negative pressure dressing on 01/10/2022 and apply honey comb dressing. Keep dressing clean and dry at all times.   ACTIVITY Walk with your walker as instructed. Use walker as long as suggested by your caregivers. Avoid periods of inactivity such as sitting longer than an hour when not asleep. This helps prevent blood clots.  You may resume a sexual relationship in one month or when given the OK by your  doctor.  You may return to work once you are cleared by your doctor.  Do not drive a car for 6 weeks or until released by you surgeon.  Do not drive while taking narcotics.  WEIGHT BEARING Weight bearing as tolerated. Use walker/cane as needed for at least 4 weeks post op.  POSTOPERATIVE CONSTIPATION PROTOCOL Constipation - defined medically as fewer than three stools per week and severe constipation as less than one stool per week.  One of the most common issues patients have following surgery is constipation.  Even if you have a regular bowel pattern at home, your normal regimen is likely to be disrupted due to multiple reasons following surgery.  Combination of anesthesia, postoperative narcotics, change in appetite and fluid intake all can affect your bowels.  In order to avoid complications following surgery, here are some recommendations in order to help you during your recovery period.  Colace (docusate) - Pick up an over-the-counter form of Colace or another stool softener and take twice a day as long as you are requiring postoperative pain medications.  Take with a full glass of water daily.  If you experience loose stools or diarrhea, hold the colace until you stool forms back up.  If your symptoms do not get better within 1 week or if they get worse, check with your doctor.  Dulcolax (bisacodyl) - Pick up over-the-counter and take as directed by the product packaging as needed to assist with the movement of your bowels.  Take with a full glass of  water.  Use this product as needed if not relieved by Colace only.  ° °MiraLax (polyethylene glycol) - Pick up over-the-counter to have on hand.  MiraLax is a solution that will increase the amount of water in your bowels to assist with bowel movements.  Take as directed and can mix with a glass of water, juice, soda, coffee, or tea.  Take if you go more than two days without a movement. °Do not use MiraLax more than once per day. Call your doctor  if you are still constipated or irregular after using this medication for 7 days in a row. ° °If you continue to have problems with postoperative constipation, please contact the office for further assistance and recommendations.  If you experience "the worst abdominal pain ever" or develop nausea or vomiting, please contact the office immediatly for further recommendations for treatment. ° °ITCHING ° If you experience itching with your medications, try taking only a single pain pill, or even half a pain pill at a time.  You can also use Benadryl over the counter for itching or also to help with sleep.  ° °TED HOSE STOCKINGS °Wear the elastic stockings on both legs for six weeks following surgery during the day but you may remove then at night for sleeping. ° °MEDICATIONS °See your medication summary on the “After Visit Summary” that the nursing staff will review with you prior to discharge.  You may have some home medications which will be placed on hold until you complete the course of blood thinner medication.  It is important for you to complete the blood thinner medication as prescribed by your surgeon.  Continue your approved medications as instructed at time of discharge. ° °PRECAUTIONS °If you experience chest pain or shortness of breath - call 911 immediately for transfer to the hospital emergency department.  °If you develop a fever greater that 101 F, purulent drainage from wound, increased redness or drainage from wound, foul odor from the wound/dressing, or calf pain - CONTACT YOUR SURGEON.   °                                                °FOLLOW-UP APPOINTMENTS °Make sure you keep all of your appointments after your operation with your surgeon and caregivers. You should call the office at the above phone number and make an appointment for approximately two weeks after the date of your surgery or on the date instructed by your surgeon outlined in the "After Visit Summary". ° °RANGE OF MOTION AND  STRENGTHENING EXERCISES  °These exercises are designed to help you keep full movement of your hip joint. Follow your caregiver's or physical therapist's instructions. Perform all exercises about fifteen times, three times per day or as directed. Exercise both hips, even if you have had only one joint replacement. These exercises can be done on a training (exercise) mat, on the floor, on a table or on a bed. Use whatever works the best and is most comfortable for you. Use music or television while you are exercising so that the exercises are a pleasant break in your day. This will make your life better with the exercises acting as a break in routine you can look forward to.  °Lying on your back, slowly slide your foot toward your buttocks, raising your knee up off the floor. Then slowly slide your   foot back down until your leg is straight again.  °Lying on your back spread your legs as far apart as you can without causing discomfort.  °Lying on your side, raise your upper leg and foot straight up from the floor as far as is comfortable. Slowly lower the leg and repeat.  °Lying on your back, tighten up the muscle in the front of your thigh (quadriceps muscles). You can do this by keeping your leg straight and trying to raise your heel off the floor. This helps strengthen the largest muscle supporting your knee.  °Lying on your back, tighten up the muscles of your buttocks both with the legs straight and with the knee bent at a comfortable angle while keeping your heel on the floor.  ° °IF YOU ARE TRANSFERRED TO A SKILLED REHAB FACILITY °If the patient is transferred to a skilled rehab facility following release from the hospital, a list of the current medications will be sent to the facility for the patient to continue.  When discharged from the skilled rehab facility, please have the facility set up the patient's Home Health Physical Therapy prior to being released. Also, the skilled facility will be responsible for  providing the patient with their medications at time of release from the facility to include their pain medication, the muscle relaxants, and their blood thinner medication. If the patient is still at the rehab facility at time of the two week follow up appointment, the skilled rehab facility will also need to assist the patient in arranging follow up appointment in our office and any transportation needs. ° °MAKE SURE YOU:  °Understand these instructions.  °Get help right away if you are not doing well or get worse.  ° ° °Pick up stool softner and laxative for home use following surgery while on pain medications. °Continue to use ice for pain and swelling after surgery. °Do not use any lotions or creams on the incision until instructed by your surgeon. ° °

## 2022-01-02 NOTE — Discharge Summary (Signed)
Physician Discharge Summary  Patient ID: Evelyn Bentley MRN: [id number] DOB/AGE: September 18, 1956 66 y.o.  Admit date: 01/02/2022 Discharge date: 01/03/2022  Admission Diagnoses:  Status post total hip replacement, right [Z96.641]   Discharge Diagnoses: Patient Active Problem List   Diagnosis Date Noted   Status post total hip replacement, 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   Lumbar degenerative disc disease 04/05/2021   MDD (major depressive disorder), recurrent, in full remission ([place]) 03/02/2020   Numbness and tingling 01/13/2020   GAD (generalized anxiety disorder) 12/22/2019   Cognitive changes 08/27/2019   Cognitive disorder 08/21/2019   MDD (major depressive disorder), recurrent, in partial remission ([place]) 08/21/2019   Nail dystrophy 08/13/2019   Pain due to onychomycosis of toenails of both feet 08/13/2019   Corns and callosities 08/13/2019   Bilateral carpal tunnel syndrome 07/21/2019   Neuropathy 07/21/2019   Panic attack 07/01/2019   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   Neuropathy of hand 02/05/2019   Bilateral hand numbness 12/24/2018   Tingling 12/17/2018   Bursitis of hip 05/06/2017   Closed fracture of patella 05/06/2017   Dizziness 05/06/2017   Eustachian tube dysfunction 05/06/2017   Knee pain 05/06/2017   Knee stiff 05/06/2017   Bilateral sensorineural hearing loss 05/06/2017   Sprain of hip 05/06/2017   Weakness of limb 05/06/2017   Gastroesophageal reflux disease 10/30/2016   Ureterolithiasis 02/72/5366   Renal colic on left side 44/01/4741   Sepsis due to urinary tract infection ([place]) 07/02/2016   Hydronephrosis with obstructing calculus 07/02/2016   History of migraine headaches 05/02/2015   Anxiety 04/18/2015   MDD (major depressive disorder), recurrent episode, moderate ([place]) 04/18/2015   H/O: hypothyroidism 03/06/2015   Fibromyalgia 03/06/2015   H/O arthritis 03/06/2015   Benign essential hypertension 01/08/2011   Hypothyroidism 11/23/2010    Past  Medical History:  Diagnosis Date   Anxiety    Arthritis    Bursitis    Rt hip   Complication of anesthesia    a.) PONV. b.) intra/postoperative hypotension 08/2020   Depression    Family history of adverse reaction to anesthesia    a.) mother - severe PONV   Fibromyalgia    GERD (gastroesophageal reflux disease)    History of 2019 novel coronavirus disease (COVID-19) 12/20/2020   a.) preoperative PCR (+) on 12/20/2020; retested following day per pt/surgeon request; PCR once again (+) on 12/21/2020   History of kidney stones    Hypercholesteremia    Hypertension    Hypothyroidism    PONV (postoperative nausea and vomiting)    Pre-diabetes    Sleep apnea    MILD- DOESN'T NEED cpap   Thyroid disease      Transfusion: none   Consultants (if any):   Discharged Condition: Improved  Hospital Course: Evelyn Bentley is an 66 y.o. female who was admitted 01/02/2022 with a diagnosis of Status post total hip replacement, right and went to the operating room on 01/02/2022 and underwent the above named procedures.    Surgeries: Procedure(s): TOTAL HIP ARTHROPLASTY ANTERIOR APPROACH on 01/02/2022 Patient tolerated the surgery well. Taken to PACU where she was stabilized and then transferred to the orthopedic floor.  Started on Lovenox 40 mg q 24 hrs. Foot pumps applied bilaterally at 80 mm. Heels elevated on bed with rolled towels. No evidence of DVT. Negative Homan. Physical therapy started on day #1 for gait training and transfer. OT started day #1 for ADL and assisted devices.  Patient's foley was d/c on day #1. Patient's IV was  d/c on day #1.  On post op day #1 patient was stable and ready for discharge to home with HHPT.    She was given perioperative antibiotics:  Anti-infectives (From admission, onward)    Start     Dose/Rate Route Frequency Ordered Stop   01/02/22 1330  ceFAZolin (ANCEF) IVPB 2g/100 mL premix        2 g 200 mL/hr over 30 Minutes Intravenous Every 6 hours  01/02/22 1214 01/02/22 2046   01/02/22 0626  ceFAZolin (ANCEF) 2-4 GM/100ML-% IVPB       Note to Pharmacy: Trudie Reed S: cabinet override      01/02/22 0626 01/02/22 1829   01/02/22 0600  ceFAZolin (ANCEF) IVPB 2g/100 mL premix        2 g 200 mL/hr over 30 Minutes Intravenous On call to O.R. 01/02/22 0151 01/02/22 0755     .  She was given sequential compression devices, early ambulation, and Lovenox TEDs for DVT prophylaxis.  She benefited maximally from the hospital stay and there were no complications.    Recent vital signs:  Vitals:   01/03/22 0720 01/03/22 1130  BP: 118/72 114/63  Pulse: 95 100  Resp: 16 16  Temp: 98.2 F (36.8 C) 98 F (36.7 C)  SpO2: 99% 99%    Recent laboratory studies:  Lab Results  Component Value Date   HGB 12.1 01/03/2022   HGB 13.1 01/02/2022   HGB 13.7 12/22/2021   Lab Results  Component Value Date   WBC 14.6 (H) 01/03/2022   PLT 286 01/03/2022   Lab Results  Component Value Date   INR 1.07 07/02/2016   Lab Results  Component Value Date   NA 139 01/03/2022   K 4.2 01/03/2022   CL 106 01/03/2022   CO2 27 01/03/2022   BUN 12 01/03/2022   CREATININE 0.66 01/03/2022   GLUCOSE 105 (H) 01/03/2022    Discharge Medications:   Allergies as of 01/03/2022       Reactions   Codeine Nausea Only        Medication List     STOP taking these medications    amitriptyline 25 MG tablet Commonly known as: ELAVIL       TAKE these medications    acetaminophen 650 MG CR tablet Commonly known as: TYLENOL Take 1,950 mg by mouth every 8 (eight) hours as needed for pain.   azelastine 0.1 % nasal spray Commonly known as: ASTELIN Place 1 spray into both nostrils 2 (two) times daily as needed for allergies.   bisacodyl 5 MG EC tablet Commonly known as: DULCOLAX Take 1 tablet (5 mg total) by mouth daily as needed for moderate constipation.   buPROPion 300 MG 24 hr tablet Commonly known as: WELLBUTRIN XL TAKE 1 TABLET BY MOUTH  EVERY DAY   busPIRone 10 MG tablet Commonly known as: BUSPAR TAKE 1 TABLET (10 MG TOTAL) BY MOUTH DAILY AS NEEDED. FOR ANXIETY   celecoxib 100 MG capsule Commonly known as: CELEBREX Take 100 mg by mouth daily.   chlorpheniramine 4 MG tablet Commonly known as: CHLOR-TRIMETON Take 4 mg by mouth 2 (two) times daily as needed for allergies.   Dexilant 60 MG capsule Generic drug: dexlansoprazole Take 60 mg by mouth daily.   enoxaparin 40 MG/0.4ML injection Commonly known as: LOVENOX Inject 0.4 mLs (40 mg total) into the skin daily for 14 days. Start taking on: January 04, 2022   fluticasone 50 MCG/ACT nasal spray Commonly known as: FLONASE Place 1  spray into both nostrils daily.   gabapentin 100 MG capsule Commonly known as: NEURONTIN Take 4 capsules TID What changed:  how much to take how to take this when to take this additional instructions   gabapentin 300 MG capsule Commonly known as: NEURONTIN Take 300 mg by mouth in the morning. What changed: Another medication with the same name was changed. Make sure you understand how and when to take each.   hydrOXYzine 10 MG tablet Commonly known as: ATARAX TAKE 1-2 TABLETS (10-20 MG TOTAL) BY MOUTH 2 (TWO) TIMES DAILY AS NEEDED (SEVERE ANXIETY SX).   levothyroxine 125 MCG tablet Commonly known as: SYNTHROID Take 125 mcg by mouth daily before breakfast.   lisinopril-hydrochlorothiazide 10-12.5 MG tablet Commonly known as: ZESTORETIC Take 1 tablet by mouth daily.   meperidine 50 MG tablet Commonly known as: DEMEROL Take 2 tablets (100 mg total) by mouth every 4 (four) hours as needed for moderate pain.   methocarbamol 500 MG tablet Commonly known as: ROBAXIN Take 1 tablet (500 mg total) by mouth every 6 (six) hours as needed for muscle spasms.   multivitamin with minerals tablet Take 1 tablet by mouth daily.   NALTREXONE HCL PO Take 4.5 mg by mouth daily.   nortriptyline 25 MG capsule Commonly known as:  PAMELOR Take 25 mg by mouth at bedtime.   oxyCODONE-acetaminophen 5-325 MG tablet Commonly known as: Percocet Take 1 tablet by mouth every 4 (four) hours as needed for severe pain.   polyethylene glycol 17 g packet Commonly known as: MIRALAX / GLYCOLAX Take 17 g by mouth daily as needed for mild constipation.   polyvinyl alcohol 1.4 % ophthalmic solution Commonly known as: LIQUIFILM TEARS Place 1 drop into both eyes as needed for dry eyes.   rosuvastatin 20 MG tablet Commonly known as: CRESTOR Take 20 mg by mouth daily.   TURMERIC PO Take 100 mg by mouth daily.   Vitamin D (Ergocalciferol) 1.25 MG (50000 UNIT) Caps capsule Commonly known as: DRISDOL Take 50,000 Units by mouth every Wednesday.               Durable Medical Equipment  (From admission, onward)           Start     Ordered   01/02/22 0920  DME Walker rolling  Once       Question Answer Comment  Walker: With 5 Inch Wheels   Patient needs a walker to treat with the following condition Status post total hip replacement, right      01/02/22 0919   01/02/22 0920  DME 3 n 1  Once        01/02/22 0919   01/02/22 0920  DME Bedside commode  Once       Question:  Patient needs a bedside commode to treat with the following condition  Answer:  Status post total hip replacement, right   01/02/22 0919            Diagnostic Studies: DG C-Arm 1-60 Min-No Report  Result Date: 01/02/2022 CLINICAL DATA:  Right hip replacement. EXAM: DG HIP (WITH OR WITHOUT PELVIS) 1V RIGHT; DG C-ARM 1-60 MIN-NO REPORT COMPARISON:  MRI of the right hip 07/30/2021. FINDINGS: 3 intraoperative images are submitted. Right total hip arthroplasty is demonstrated. No acute fractures are present. The prosthesis appears well positioned in this single view. IMPRESSION: Right total hip arthroplasty without radiographic evidence for complication. Electronically Signed   By: San Morelle M.D.   On: 01/02/2022 08:43  DG C-Arm 1-60  Min-No Report  Result Date: 01/02/2022 CLINICAL DATA:  Right hip replacement. EXAM: DG HIP (WITH OR WITHOUT PELVIS) 1V RIGHT; DG C-ARM 1-60 MIN-NO REPORT COMPARISON:  MRI of the right hip 07/30/2021. FINDINGS: 3 intraoperative images are submitted. Right total hip arthroplasty is demonstrated. No acute fractures are present. The prosthesis appears well positioned in this single view. IMPRESSION: Right total hip arthroplasty without radiographic evidence for complication. Electronically Signed   By: San Morelle M.D.   On: 01/02/2022 08:43   DG HIP UNILAT WITH PELVIS 1V RIGHT  Result Date: 01/02/2022 CLINICAL DATA:  Right hip replacement. EXAM: DG HIP (WITH OR WITHOUT PELVIS) 1V RIGHT; DG C-ARM 1-60 MIN-NO REPORT COMPARISON:  MRI of the right hip 07/30/2021. FINDINGS: 3 intraoperative images are submitted. Right total hip arthroplasty is demonstrated. No acute fractures are present. The prosthesis appears well positioned in this single view. IMPRESSION: Right total hip arthroplasty without radiographic evidence for complication. Electronically Signed   By: San Morelle M.D.   On: 01/02/2022 08:43   DG HIP UNILAT W OR W/O PELVIS 2-3 VIEWS RIGHT  Result Date: 01/02/2022 CLINICAL DATA:  Status post right hip replacement.  Postop pain. EXAM: DG HIP (WITH OR WITHOUT PELVIS) 2-3V RIGHT COMPARISON:  Intraoperative images earlier today FINDINGS: AP and lateral views demonstrate right hip arthroplasty with overlying soft tissue gas and surgical staples. No hardware complication or periprosthetic fracture. IMPRESSION: Expected appearance after right hip arthroplasty. Electronically Signed   By: Abigail Miyamoto M.D.   On: 01/02/2022 09:29    Disposition:      Follow-up Information     Duanne Guess, PA-C Follow up in 2 week(s).   Specialties: Orthopedic Surgery, Emergency Medicine Contact information: [place] [address] [phone]                   Signed: Feliberto Gottron 01/03/2022, 12:00 PM

## 2022-01-02 NOTE — Anesthesia Preprocedure Evaluation (Signed)
Anesthesia Evaluation  Patient identified by MRN, date of birth, ID band Patient awake    Reviewed: Allergy & Precautions, H&P , NPO status , Patient's Chart, lab work & pertinent test results, reviewed documented beta blocker date and time   History of Anesthesia Complications (+) PONV and Family history of anesthesia reaction  Airway Mallampati: II   Neck ROM: full    Dental  (+) Poor Dentition   Pulmonary sleep apnea , former smoker,    Pulmonary exam normal        Cardiovascular Exercise Tolerance: Good hypertension, On Medications negative cardio ROS Normal cardiovascular exam Rhythm:regular Rate:Normal     Neuro/Psych PSYCHIATRIC DISORDERS Anxiety Depression  Neuromuscular disease    GI/Hepatic Neg liver ROS, GERD  Medicated,  Endo/Other  Hypothyroidism   Renal/GU Renal disease  negative genitourinary   Musculoskeletal   Abdominal   Peds  Hematology negative hematology ROS (+)   Anesthesia Other Findings Past Medical History: No date: Anxiety No date: Arthritis No date: Bursitis     Comment:  Rt hip No date: Complication of anesthesia     Comment:  a.) PONV. b.) intra/postoperative hypotension 08/2020 No date: Depression No date: Family history of adverse reaction to anesthesia     Comment:  a.) mother - severe PONV No date: Fibromyalgia No date: GERD (gastroesophageal reflux disease) 12/20/2020: History of 2019 novel coronavirus disease (COVID-19)     Comment:  a.) preoperative PCR (+) on 12/20/2020; retested               following day per pt/surgeon request; PCR once again (+)               on 12/21/2020 No date: History of kidney stones No date: Hypercholesteremia No date: Hypertension No date: Hypothyroidism No date: PONV (postoperative nausea and vomiting) No date: Pre-diabetes No date: Sleep apnea     Comment:  MILD- DOESN'T NEED cpap No date: Thyroid disease Past Surgical History: No  date: ABDOMINAL HYSTERECTOMY 01/05/2021: ANTERIOR INTEROSSEOUS NERVE DECOMPRESSION; Left     Comment:  Procedure: Cubital tunnel release, left;  Surgeon: Hessie Knows, MD;  Location: [facility] ORS;  Service: Orthopedics;               Laterality: Left; 09/01/2020: CARPAL TUNNEL RELEASE; Right     Comment:  Procedure: Right carpal tunnel release;  Surgeon: Hessie Knows, MD;  Location: [facility];  Service: Orthopedics;               Laterality: Right; 01/05/2021: CARPAL TUNNEL RELEASE; Left     Comment:  Procedure: Carpal tunnel release, left;  Surgeon: Hessie Knows, MD;  Location: [facility];  Service: Orthopedics;               Laterality: Left; No date: CATARACT EXTRACTION, BILATERAL No date: CHOLECYSTECTOMY 11/13/2016: COLONOSCOPY WITH PROPOFOL; N/A     Comment:  Procedure: COLONOSCOPY WITH PROPOFOL;  Surgeon: Lollie Sails, MD;  Location: [facility] ENDOSCOPY;  Service:               Endoscopy;  Laterality: N/A; 11/13/2016: ESOPHAGOGASTRODUODENOSCOPY (EGD) WITH PROPOFOL; N/A     Comment:  Procedure: ESOPHAGOGASTRODUODENOSCOPY (EGD) WITH  PROPOFOL;  Surgeon: Lollie Sails, MD;  Location:               [facility] ENDOSCOPY;  Service: Endoscopy;  Laterality: N/A; 12/16/2018: ESOPHAGOGASTRODUODENOSCOPY (EGD) WITH PROPOFOL; N/A     Comment:  Procedure: ESOPHAGOGASTRODUODENOSCOPY (EGD) WITH               PROPOFOL;  Surgeon: Lollie Sails, MD;  Location:               [facility] ENDOSCOPY;  Service: Endoscopy;  Laterality: N/A; 2094: fundoflication     Comment:  at [facility] hospital 02/28/2016: LAPAROSCOPIC HYSTERECTOMY; Bilateral     Comment:  Procedure: HYSTERECTOMY TOTAL LAPAROSCOPIC / BSO;                Surgeon: Honor Loh Ward, MD;  Location: [facility] ORS;                Service: Gynecology;  Laterality: Bilateral; No date: NECK SURGERY     Comment:  X 2 No date: NISSEN FUNDOPLICATION No date: TUBAL LIGATION 07/03/2016:  URETEROSCOPY WITH HOLMIUM LASER LITHOTRIPSY; Left     Comment:  Procedure: URETEROSCOPY WITH HOLMIUM LASER LITHOTRIPSY;               Surgeon: Royston Cowper, MD;  Location: [facility] ORS;                Service: Urology;  Laterality: Left;   Reproductive/Obstetrics negative OB ROS                             Anesthesia Physical Anesthesia Plan  ASA: 3  Anesthesia Plan: Spinal   Post-op Pain Management:    Induction:   PONV Risk Score and Plan: 4 or greater  Airway Management Planned:   Additional Equipment:   Intra-op Plan:   Post-operative Plan:   Informed Consent: I have reviewed the patients History and Physical, chart, labs and discussed the procedure including the risks, benefits and alternatives for the proposed anesthesia with the patient or authorized representative who has indicated his/her understanding and acceptance.     Dental Advisory Given  Plan Discussed with: CRNA  Anesthesia Plan Comments: (Hx of post op shivering discussed with patient. )        Anesthesia Quick Evaluation

## 2022-01-02 NOTE — Evaluation (Signed)
Physical Therapy Evaluation Patient Details Name: Evelyn Bentley MRN: [id number] DOB: [birth year] Today's Date: 01/02/2022  History of Present Illness  admitted for acute hospitalization s/p R THA, anterior approach, WBAT (01/02/22)  Clinical Impression  Patient resting in bed upon arrival to room; alert and oriented, follows commands, slightly anxious about mobility efforts.  Daughter present at bedside throughout session; very present, hands-on and encouraging to patient.  Rates R hip pain 6/10; meds received just prior to session. Demonstrates fair strength (3-/5) and ROM to R hip; requires act assist from therapist to complete movement all planes, generally limited by pain in all directions.  Currently requiring min assist for bed mobility; min assist for sit/stand, bed/chair transfer with RW. Heavy WBing bilat UEs with limited loading/WBing R LE (constant cuing for R foot flat and R TKE as tolerated).  Additional gait deferred due to pain; will continue to assess/progress as appropriate next session. Would benefit from skilled PT to address above deficits and promote optimal return to PLOF.; Recommend transition to HHPT upon discharge from acute hospitalization.        Recommendations for follow up therapy are one component of a multi-disciplinary discharge planning process, led by the attending physician.  Recommendations may be updated based on patient status, additional functional criteria and insurance authorization.  Follow Up Recommendations Home health PT    Assistance Recommended at Discharge PRN  Patient can return home with the following  A little help with walking and/or transfers;A little help with bathing/dressing/bathroom;Assistance with cooking/housework    Equipment Recommendations Rolling walker (2 wheels);BSC/3in1  Recommendations for Other Services       Functional Status Assessment Patient has had a recent decline in their functional status and demonstrates the  ability to make significant improvements in function in a reasonable and predictable amount of time.     Precautions / Restrictions Precautions Precautions: Fall;Anterior Hip Restrictions Weight Bearing Restrictions: Yes RLE Weight Bearing: Weight bearing as tolerated      Mobility  Bed Mobility Overal bed mobility: Needs Assistance Bed Mobility: Supine to Sit     Supine to sit: Min assist          Transfers Overall transfer level: Needs assistance Equipment used: Rolling walker (2 wheels) Transfers: Sit to/from Stand, Bed to chair/wheelchair/BSC Sit to Stand: Min assist Stand pivot transfers: Min assist         General transfer comment: cuing for hand/foot placement; increased time, encouragement to complete    Ambulation/Gait               General Gait Details: deferred due to pain  Stairs            Wheelchair Mobility    Modified Rankin (Stroke Patients Only)       Balance Overall balance assessment: Needs assistance Sitting-balance support: No upper extremity supported, Feet supported Sitting balance-Leahy Scale: Good     Standing balance support: Bilateral upper extremity supported Standing balance-Leahy Scale: Fair                               Pertinent Vitals/Pain Pain Assessment Pain Assessment: 0-10 Pain Score: 6  Pain Location: R hip Pain Descriptors / Indicators: Aching, Guarding, Grimacing Pain Intervention(s): Limited activity within patient's tolerance, Monitored during session, Premedicated before session, Repositioned    Home Living Family/patient expects to be discharged to:: Private residence Living Arrangements: Children Available Help at Discharge: Family;Available PRN/intermittently Type of Home: House  Home Access: Level entry       Home Layout: One level Home Equipment: None      Prior Function Prior Level of Function : Independent/Modified Independent             Mobility Comments:  Indep with ADLs, household and community mobilization; + driving; working full-time in [employer]        Extremity/Trunk Assessment   Upper Extremity Assessment Upper Extremity Assessment: Overall WFL for tasks assessed    Lower Extremity Assessment Lower Extremity Assessment:  (R hip grossly 3-/5 throughout, R knee 2+ to 3-/5 (limited by pain), R ankle 4-/5.  L LE grossly WFL)       Communication      Cognition Arousal/Alertness: Awake/alert Behavior During Therapy: WFL for tasks assessed/performed Overall Cognitive Status: Within Functional Limits for tasks assessed                                          General Comments      Exercises Other Exercises Other Exercises: Reviewed role of PT and progressive mobility, R hip precautions and WBing recommendations; voiced understanding and agreement. Other Exercises: Supine R LE therex, 1x10, act assist ROM: ankle pumps, quad sets, SAQs, heel slides and hip abduct/adduct.  Generally guarded and pain-limited, requiring act assist from therapist for movement throughout all planes.   Assessment/Plan    PT Assessment Patient needs continued PT services  PT Problem List Decreased strength;Decreased range of motion;Decreased activity tolerance;Decreased balance;Decreased mobility;Decreased knowledge of use of DME;Decreased safety awareness;Decreased knowledge of precautions;Decreased skin integrity;Pain       PT Treatment Interventions DME instruction;Gait training;Stair training;Functional mobility training;Therapeutic activities;Therapeutic exercise;Balance training;Patient/family education    PT Goals (Current goals can be found in the Care Plan section)  Acute Rehab PT Goals Patient Stated Goal: to get OOB, to return home PT Goal Formulation: With patient/family Time For Goal Achievement: 01/16/22 Potential to Achieve Goals: Good    Frequency BID     Co-evaluation                AM-PAC PT "6 Clicks" Mobility  Outcome Measure Help needed turning from your back to your side while in a flat bed without using bedrails?: None Help needed moving from lying on your back to sitting on the side of a flat bed without using bedrails?: A Little Help needed moving to and from a bed to a chair (including a wheelchair)?: A Little Help needed standing up from a chair using your arms (e.g., wheelchair or bedside chair)?: A Little Help needed to walk in hospital room?: A Little Help needed climbing 3-5 steps with a railing? : A Lot 6 Click Score: 18    End of Session Equipment Utilized During Treatment: Gait belt Activity Tolerance: Patient tolerated treatment well Patient left: in chair;with call bell/phone within reach;with family/visitor present;with chair alarm set Nurse Communication: Mobility status PT Visit Diagnosis: Other abnormalities of gait and mobility (R26.89);Pain;Muscle weakness (generalized) (M62.81) Pain - Right/Left: Right Pain - part of body: Hip    Time: 1610-9604 PT Time Calculation (min) (ACUTE ONLY): 38 min   Charges:   PT Evaluation $PT Eval Moderate Complexity: 1 Mod PT Treatments $Therapeutic Exercise: 8-22 mins $Therapeutic Activity: 8-22 mins        Belkis Norbeck H. Owens Shark, PT, DPT, NCS 01/02/22, 5:03 PM [phone]

## 2022-01-02 NOTE — Anesthesia Postprocedure Evaluation (Signed)
Anesthesia Post Note  Patient: Evelyn Bentley  Procedure(s) Performed: TOTAL HIP ARTHROPLASTY ANTERIOR APPROACH (Right: Hip)  Patient location during evaluation: PACU Anesthesia Type: Spinal Level of consciousness: awake and alert Pain management: pain level controlled Vital Signs Assessment: post-procedure vital signs reviewed and stable Respiratory status: spontaneous breathing, nonlabored ventilation, respiratory function stable and patient connected to nasal cannula oxygen Cardiovascular status: blood pressure returned to baseline and stable Postop Assessment: no apparent nausea or vomiting Anesthetic complications: no   No notable events documented.   Last Vitals:  Vitals:   01/02/22 0942 01/02/22 0945  BP:  122/67  Pulse: 79 78  Resp: 13 12  Temp:    SpO2: 98% 98%    Last Pain:  Vitals:   01/02/22 0942  TempSrc:   PainSc: Bloxom Serai Tukes

## 2022-01-02 NOTE — H&P (Signed)
Chief Complaint  Patient presents with   Right Hip - Follow-up  H&P- THA on 01/02/22 by Dr. Rudene Christians   Reason for Visit Evelyn Bentley is a 66 y.o. who presents today for history and physical for right total hip arthroplasty with Dr. Hessie Knows on 01/02/2022. She describes severe right groin and thigh pain for several years that has been increasing despite conservative treatment. She has been seen by the physiatrist and had a intra-articular cortisone injection several years ago which worked well for 1 to 2 years but the pain has been increasing over the last year. She was seen by Dr. Alba Destine 1 month ago and received intra-articular cortisone injection which lasted a week. Symptoms returned to the level that they were prior to the injection. Patient has pain in her right groin, thigh, buttocks that increased with weightbearing activity, activities involving hip flexion. She gets no relief with Celebrex. She is having take Norco a few times daily. Pain is moderate to severe and interferes with activities of daily living such as bending over to pick up things off the floor, getting in and out of a low vehicle and putting on her socks and shoes.   Past Medical History Past Medical History:  Diagnosis Date   Arthritis   Bursitis   Chronic gastritis 11/13/2016   Depression   Diverticulosis 11/13/2016   Duodenitis 11/13/2016   Erosive esophagitis 11/13/2016   Gastric ulcer without hemorrhage or perforation 11/13/2016   Hyperplastic colon polyp 11/13/2016   Hypertension   Hypothyroid, unspecified   Migraine   Tubular adenoma of colon 11/13/2016   Past Surgical History Past Surgical History:  Procedure Laterality Date   COLONOSCOPY 11/13/2016  Tubular adenoma of colon/Hyperplastic colon polyp/Diverticulosis/Repeat 78yrs/MUS   EGD 11/13/2016  Chronic gastritis/Duodenitis/Non-bleeding gastric ulcer/Erosive esophagitis/No Repeat/MUS   EGD 12/16/2018  GERD/No Repeat/MUS   EYE SURGERY Bilateral  2021  cataract surgery   ENDOSCOPIC CARPAL TUNNEL RELEASE Right 09/01/2020  Dr. Sonnie Alamo TUNNEL RELEASE Right 09/01/2020  Dr. Rudene Christians   2 neck surgeries   CHOLECYSTECTOMY   FUNDOPLASTY TRANSTHORACIC   Past Family History Family History  Problem Relation Age of Onset   Thyroid disease Mother   Diabetes Mother   Gallbladder disease Mother   Neck pain Mother   Arthritis Mother   Fibromyalgia Mother   No Known Problems Father   Arthritis Maternal Grandmother   Diabetes Maternal Grandmother   No Known Problems Maternal Grandfather   No Known Problems Paternal Grandmother   No Known Problems Paternal Grandfather   Charcot-Marie-Tooth disease Daughter   Arthritis Paternal Aunt   Colon cancer Neg Hx   Colon polyps Neg Hx   Liver disease Neg Hx   Rectal cancer Neg Hx   Ulcers Neg Hx   Medications Current Outpatient Medications Ordered in Epic  Medication Sig Dispense Refill   acetaminophen (TYLENOL) 650 MG ER tablet Take by mouth   ALLERGY RELIEF-D,FEXOFENADINE, 60-120 mg ER tablet Take 1 tablet by mouth once daily   azelastine (ASTELIN) 137 mcg nasal spray Place into one nostril as needed   buPROPion (WELLBUTRIN XL) 300 MG XL tablet Take by mouth   busPIRone (BUSPAR) 10 MG tablet TAKE 1 TABLET BY MOUTH TWICE A DAY   calcium carbonate-vit D3-min 600 mg calcium- 400 unit Tab Take by mouth   celecoxib (CELEBREX) 100 MG capsule Take 1 capsule (100 mg total) by mouth once daily 30 capsule 5   dexlansoprazole (DEXILANT) 60 mg DR capsule Take 1 capsule by  mouth once daily   fluticasone (FLONASE) 50 mcg/actuation nasal spray Place 2 sprays into both nostrils once daily.   gabapentin (NEURONTIN) 300 MG capsule Take 1 capsule (300 mg total) by mouth 3 (three) times daily 270 capsule 1   HYDROcodone-acetaminophen (NORCO) 5-325 mg tablet Take 1 tablet by mouth every 6 (six) hours as needed 40 tablet 0   hydrOXYzine HCl (ATARAX) 10 MG tablet Take 1 tablet by mouth 2 (two) times daily as  needed   levothyroxine (SYNTHROID) 125 MCG tablet Take 125 mcg by mouth once daily   lisinopril-hydrochlorothiazide (PRINZIDE,ZESTORETIC) 10-12.5 mg tablet Take 1 tablet by mouth once daily.   nortriptyline (PAMELOR) 25 MG capsule Take 1 capsule (25 mg total) by mouth nightly 30 capsule 3   rosuvastatin (CRESTOR) 10 MG tablet Take 1 tablet by mouth once daily   VITAMIN D3 ORAL Take by mouth once daily   No current Epic-ordered facility-administered medications on file.   Allergies Allergies  Allergen Reactions   Codeine Nausea and Vomiting    Review of Systems A comprehensive 14 point ROS was performed, reviewed, and the pertinent orthopaedic findings are documented in the HPI.  Exam BP 122/82 (BP Location: Left upper arm, Patient Position: Sitting)   Ht 152.4 cm (5')   Wt 67.4 kg (148 lb 9.6 oz)   BMI 29.02 kg/m  General:  Well developed, well nourished, no apparent distress, normal affect, and gait.  HEENT: Head normocephalic, atraumatic, PERRL.   Abdomen: Soft, non tender, non distended, Bowel sounds present.  Heart: Examination of the heart reveals regular, rate, and rhythm. There is no murmur noted on ascultation. There is a normal apical pulse.  Lungs: Lungs are clear to auscultation. There is no wheeze, rhonchi, or crackles. There is normal expansion of bilateral chest walls.   Right lower extremity: Right lower extremity shows pain with hip flexion with motion being approximately 100 degrees. Severe pain with right hip internal rotation, 20 degrees of internal rotation with significant stiffness. External rotation was 50 degrees with no pain. Straight leg raise were negative in the sitting and supine position. Patient is able to actively straight leg raise. Ankle plantarflexion dorsiflexion is intact. No swelling erythema or edema throughout the right lower extremity.  X-ray  X-rays of the right hip reviewed by me from 06/23/2021 show advanced central joint space  narrowing with large cam. She has large superior acetabular spurring with sclerotic changes and decreased superior acetabular joint space. No evidence of acute bony abnormality or abnormal bony lesions.  Impression  1. Degenerative arthrosis right hip  Plan   65. 66 year old female with advanced right hip osteoarthritis. Pain has been present for years and pain has been progressing to the point where she is no longer receiving relief with conservative treatment. Pain interferes with her activities of daily living and quality of life. Risks, benefits, complications of a right total hip arthroplasty have been discussed with the patient. Patient has agreed and consent the procedure Dr. Hessie Knows on 01/02/2022. This note was generated in part with voice recognition software and I apologize for any typographical errors that were not detected and corrected    Electronically signed by Feliberto Gottron, PA at 12/20/2021 9:35 AM EST  Reviewed  H+P. No changes noted.

## 2022-01-02 NOTE — Plan of Care (Signed)

## 2022-01-02 NOTE — Op Note (Signed)
01/02/2022  8:47 AM  PATIENT:  Evelyn Bentley  66 y.o. female  PRE-OPERATIVE DIAGNOSIS:  Right groin pain  R10.31 Primary osteoarthritis of right hip M16.11  POST-OPERATIVE DIAGNOSIS:  Right groin pain  R10.31 Primary osteoarthritis of right hip M16.11  PROCEDURE:  Procedure(s): TOTAL HIP ARTHROPLASTY ANTERIOR APPROACH (Right)  SURGEON: Laurene Footman, MD  ASSISTANTS: None  ANESTHESIA:   spinal  EBL:  Total I/O In: 900 [I.V.:800; IV Piggyback:100] Out: 575 [Urine:425; Blood:150]  BLOOD ADMINISTERED:none  DRAINS:  Incisional wound VAC    LOCAL MEDICATIONS USED:  MARCAINE    and OTHER Exparel  SPECIMEN: Right femoral head  DISPOSITION OF SPECIMEN:  PATHOLOGY  COUNTS:  YES  TOURNIQUET:  * No tourniquets in log *  IMPLANTS: Medacta AMIS 4 standard stem, 50 mm Mpact DM cup and liner with ceramic S 28 mm head  DICTATION: .Dragon Dictation   The patient was brought to the operating room and after spinal anesthesia was obtained patient was placed on the operative table with the ipsilateral foot into the Medacta attachment, contralateral leg on a well-padded table. C-arm was brought in and preop template x-ray taken. After prepping and draping in usual sterile fashion appropriate patient identification and timeout procedures were completed. Anterior approach to the hip was obtained and centered over the greater trochanter and TFL muscle. The subcutaneous tissue was incised hemostasis being achieved by electrocautery. TFL fascia was incised and the muscle retracted laterally deep retractor placed. The lateral femoral circumflex vessels were identified and ligated. The anterior capsule was exposed and a capsulotomy performed. The neck was identified and a femoral neck cut carried out with a saw. The head was removed without difficulty and showed sclerotic femoral head and acetabulum. Reaming was carried out to 50 mm and a 50 mm cup trial gave appropriate tightness to the acetabular  component a 50 DM cup was impacted into position. The leg was then externally rotated and ischiofemoral and pubofemoral releases carried out. The femur was sequentially broached to a size 4, size 4 standard with S head trials were placed and the final components chosen. The 4 standard stem was inserted along with a ceramic S 28 mm head and 50 mm liner. The hip was reduced and was stable the wound was thoroughly irrigated with fibrillar placed along the posterior capsule and medial neck. The deep fascia ws closed using a heavy Quill after infiltration of 30 cc of quarter percent Sensorcaine with epinephrine diluted with Exparel throughout the case .3-0 V-loc to close the skin with skin staples.  Incisional wound VAC applied and patient was sent to recovery in stable condition.   PLAN OF CARE: Admit for overnight observation

## 2022-01-03 ENCOUNTER — Encounter: Payer: Self-pay | Admitting: Orthopedic Surgery

## 2022-01-03 DIAGNOSIS — M1611 Unilateral primary osteoarthritis, right hip: Secondary | ICD-10-CM | POA: Diagnosis not present

## 2022-01-03 LAB — CBC
HCT: 35.6 % — ABNORMAL LOW (ref 36.0–46.0)
Hemoglobin: 12.1 g/dL (ref 12.0–15.0)
MCH: 30.6 pg (ref 26.0–34.0)
MCHC: 34 g/dL (ref 30.0–36.0)
MCV: 90.1 fL (ref 80.0–100.0)
Platelets: 286 10*3/uL (ref 150–400)
RBC: 3.95 MIL/uL (ref 3.87–5.11)
RDW: 11.9 % (ref 11.5–15.5)
WBC: 14.6 10*3/uL — ABNORMAL HIGH (ref 4.0–10.5)
nRBC: 0 % (ref 0.0–0.2)

## 2022-01-03 LAB — BASIC METABOLIC PANEL
Anion gap: 6 (ref 5–15)
BUN: 12 mg/dL (ref 8–23)
CO2: 27 mmol/L (ref 22–32)
Calcium: 8.6 mg/dL — ABNORMAL LOW (ref 8.9–10.3)
Chloride: 106 mmol/L (ref 98–111)
Creatinine, Ser: 0.66 mg/dL (ref 0.44–1.00)
GFR, Estimated: >60 mL/min (ref >60)
Glucose, Bld: 105 mg/dL — ABNORMAL HIGH (ref 70–99)
Potassium: 4.2 mmol/L (ref 3.5–5.1)
Sodium: 139 mmol/L (ref 135–145)

## 2022-01-03 LAB — GLUCOSE, CAPILLARY: Glucose-Capillary: 92 mg/dL (ref 70–99)

## 2022-01-03 MED ORDER — METHOCARBAMOL 500 MG PO TABS
500.0000 mg | ORAL_TABLET | Freq: Four times a day (QID) | ORAL | 0 refills | Status: DC | PRN
Start: 2022-01-03 — End: 2023-03-04

## 2022-01-03 MED ORDER — MEPERIDINE HCL 50 MG PO TABS
100.0000 mg | ORAL_TABLET | ORAL | Status: DC | PRN
  Administered 2022-01-03: 100 mg via ORAL
  Filled 2022-01-03: qty 2

## 2022-01-03 MED ORDER — ENOXAPARIN SODIUM 40 MG/0.4ML IJ SOSY: 40.0000 mg | PREFILLED_SYRINGE | INTRAMUSCULAR | 0 refills | Status: DC

## 2022-01-03 MED ORDER — BISACODYL 5 MG PO TBEC
5.0000 mg | DELAYED_RELEASE_TABLET | Freq: Every day | ORAL | 0 refills | Status: DC | PRN
Start: 2022-01-03 — End: 2023-03-04

## 2022-01-03 MED ORDER — POLYETHYLENE GLYCOL 3350 17 G PO PACK
17.0000 g | PACK | Freq: Every day | ORAL | 0 refills | Status: DC | PRN
Start: 2022-01-03 — End: 2024-10-01

## 2022-01-03 MED ORDER — MEPERIDINE HCL 50 MG PO TABS: 100.0000 mg | ORAL_TABLET | ORAL | 0 refills | Status: DC | PRN

## 2022-01-03 NOTE — Progress Notes (Signed)
Physical Therapy Treatment Patient Details Name: Evelyn Bentley MRN: [id number] DOB: [birth year] Today's Date: 01/03/2022   History of Present Illness 66 yo F s/p R THA, anterior approach, WBAT (01/02/22). PMH significant for arthritis, depression, HTN, hypothyroid    PT Comments    Pt received upright in recliner agreeable to PT services. Pt progressing in therapy with ability to stand with minguard and min VC's for hand placement amb ~45-50' with RW and step to pattern.  Pt remained steady throughout with supervision but unable to progress to step through pattern at this time. Able to return to supine in bed with supervision and education on home modification in assisting RLE into bed. HEP packet provided and performed reps/sets as listed below. Due to pain and proximal hip weakness, some AAROM was need with SAQ and SLR on RLE. Education provided to pt and daughter on how to modify at current function until pt able to perform independently. Pt appropriate to d/c home with [facility] PT services at this time. Will continue to follow acutely until d/c.   Recommendations for follow up therapy are one component of a multi-disciplinary discharge planning process, led by the attending physician.  Recommendations may be updated based on patient status, additional functional criteria and insurance authorization.  Follow Up Recommendations  Home health PT     Assistance Recommended at Discharge PRN  Patient can return home with the following A little help with walking and/or transfers;A little help with bathing/dressing/bathroom;Assistance with cooking/housework   Equipment Recommendations  Rolling walker (2 wheels);BSC/3in1    Recommendations for Other Services       Precautions / Restrictions Precautions Precautions: Fall;Anterior Hip Precaution Booklet Issued: Yes (comment) Restrictions Weight Bearing Restrictions: Yes RLE Weight Bearing: Weight bearing as tolerated     Mobility  Bed  Mobility Overal bed mobility: Modified Independent             General bed mobility comments: Received in recliner then returned to bed post session Patient Response: Cooperative  Transfers Overall transfer level: Needs assistance Equipment used: Rolling walker (2 wheels) Transfers: Sit to/from Stand Sit to Stand: Min guard           General transfer comment: Required cuing for hand placement with STS. Excellent carryover with stand to sit.    Ambulation/Gait Ambulation/Gait assistance: Supervision Gait Distance (Feet): 50 Feet Assistive device: Rolling walker (2 wheels) Gait Pattern/deviations: Step-to pattern, Decreased step length - left, Decreased stance time - right       General Gait Details: Heavy UE support on RW   Stairs             Wheelchair Mobility    Modified Rankin (Stroke Patients Only)       Balance Overall balance assessment: Needs assistance Sitting-balance support: No upper extremity supported, Feet supported Sitting balance-Leahy Scale: Good     Standing balance support: Bilateral upper extremity supported, During functional activity Standing balance-Leahy Scale: Fair                              Cognition Arousal/Alertness: Awake/alert Behavior During Therapy: WFL for tasks assessed/performed Overall Cognitive Status: Within Functional Limits for tasks assessed                                          Exercises Total Joint Exercises Ankle Circles/Pumps: AROM,  Supine, 15 reps, Strengthening, Both Quad Sets: AROM, Strengthening, Right, 15 reps, Supine Gluteal Sets: AROM, Strengthening, Both, 15 reps, Supine Towel Squeeze: AROM, Strengthening, Both, 15 reps, Supine Short Arc Quad: AAROM, Strengthening, Right, 10 reps, Supine Heel Slides: AROM, Strengthening, Right, 10 reps, Supine Hip ABduction/ADduction: AROM, Strengthening, Right, 15 reps, Supine Straight Leg Raises: AAROM, Strengthening,  Right, 10 reps, Supine Other Exercises Other Exercises: Education on HEP packet provided with reps, sets frequency. Set up modifications to assist in RLE navigation into bed    General Comments        Pertinent Vitals/Pain Pain Assessment Pain Assessment: 0-10 Pain Score: 4  Pain Location: R hip Pain Descriptors / Indicators: Aching, Guarding, Grimacing Pain Intervention(s): Limited activity within patient's tolerance, Monitored during session, Premedicated before session, Repositioned    Home Living Family/patient expects to be discharged to:: Private residence Living Arrangements: Children Available Help at Discharge: Family;Available PRN/intermittently Type of Home: House Home Access: Level entry       Home Layout: One level Home Equipment: None      Prior Function            PT Goals (current goals can now be found in the care plan section) Acute Rehab PT Goals Patient Stated Goal: to get OOB, to return home PT Goal Formulation: With patient/family Time For Goal Achievement: 01/16/22 Potential to Achieve Goals: Good Progress towards PT goals: Progressing toward goals    Frequency    BID      PT Plan Current plan remains appropriate    Co-evaluation              AM-PAC PT "6 Clicks" Mobility   Outcome Measure  Help needed turning from your back to your side while in a flat bed without using bedrails?: None Help needed moving from lying on your back to sitting on the side of a flat bed without using bedrails?: A Little Help needed moving to and from a bed to a chair (including a wheelchair)?: A Little Help needed standing up from a chair using your arms (e.g., wheelchair or bedside chair)?: A Little Help needed to walk in hospital room?: A Little Help needed climbing 3-5 steps with a railing? : A Lot 6 Click Score: 18    End of Session Equipment Utilized During Treatment: Gait belt Activity Tolerance: Patient tolerated treatment well Patient  left: in bed;with call bell/phone within reach;with family/visitor present Nurse Communication: Mobility status PT Visit Diagnosis: Other abnormalities of gait and mobility (R26.89);Pain;Muscle weakness (generalized) (M62.81) Pain - Right/Left: Right Pain - part of body: Hip     Time: 9485-4627 PT Time Calculation (min) (ACUTE ONLY): 32 min  Charges:  $Gait Training: 8-22 mins $Therapeutic Exercise: 8-22 mins                     Octavion Mollenkopf M. Fairly IV, PT, DPT Physical Therapist- [facility]  01/03/2022, 10:48 AM

## 2022-01-03 NOTE — Progress Notes (Signed)
° °  Subjective: 1 Day Post-Op Procedure(s) (LRB): TOTAL HIP ARTHROPLASTY ANTERIOR APPROACH (Right) Patient reports pain as mild and moderate.   Patient is well, and has had no acute complaints or problems Denies any CP, SOB, ABD pain. We will continue therapy today.  Plan is to go Home after hospital stay.  Objective: Vital signs in last 24 hours: Temp:  [97.3 F (36.3 C)-98.7 F (37.1 C)] 98.2 F (36.8 C) (02/22 0720) Pulse Rate:  [64-105] 95 (02/22 0720) Resp:  [10-21] 16 (02/22 0720) BP: (107-135)/(65-93) 118/72 (02/22 0720) SpO2:  [94 %-100 %] 99 % (02/22 0720)  Intake/Output from previous day: 02/21 0701 - 02/22 0700 In: 3476.7 [P.O.:200; I.V.:3076.7; IV Piggyback:200] Out: 2025 [Urine:1875; Blood:150] Intake/Output this shift: No intake/output data recorded.  Recent Labs    01/02/22 1341  HGB 13.1   Recent Labs    01/02/22 1341  WBC 14.4*  RBC 4.26  HCT 38.8  PLT 287   Recent Labs    01/02/22 1341  CREATININE 0.86   No results for input(s): LABPT, INR in the last 72 hours.  EXAM General - Patient is Alert, Appropriate, and Oriented Extremity - Neurovascular intact Sensation intact distally Intact pulses distally Dorsiflexion/Plantar flexion intact No cellulitis present Compartment soft Dressing - dressing C/D/I and no drainage, provena intact with out drainage Motor Function - intact, moving foot and toes well on exam.   Past Medical History:  Diagnosis Date   Anxiety    Arthritis    Bursitis    Rt hip   Complication of anesthesia    a.) PONV. b.) intra/postoperative hypotension 08/2020   Depression    Family history of adverse reaction to anesthesia    a.) mother - severe PONV   Fibromyalgia    GERD (gastroesophageal reflux disease)    History of 2019 novel coronavirus disease (COVID-19) 12/20/2020   a.) preoperative PCR (+) on 12/20/2020; retested following day per pt/surgeon request; PCR once again (+) on 12/21/2020   History of kidney  stones    Hypercholesteremia    Hypertension    Hypothyroidism    PONV (postoperative nausea and vomiting)    Pre-diabetes    Sleep apnea    MILD- DOESN'T NEED cpap   Thyroid disease     Assessment/Plan:   1 Day Post-Op Procedure(s) (LRB): TOTAL HIP ARTHROPLASTY ANTERIOR APPROACH (Right) Principal Problem:   Status post total hip replacement, right  Estimated body mass index is 29.12 kg/m as calculated from the following:   Height as of 12/22/21: 5' (1.524 m).   Weight as of 12/22/21: 67.6 kg. Advance diet Up with therapy Pain moderate to severe, increased Demerol to 100 mg from 50 mg. Try to wean off IV medications.  VSS Labs pending  CM to assist with discharge to home with HHPT pending completion of PT goals  DVT Prophylaxis - Lovenox, TED hose, and SCDs Weight-Bearing as tolerated to right leg   T. Rachelle Hora, PA-C [place] 01/03/2022, 8:09 AM

## 2022-01-03 NOTE — Progress Notes (Signed)
Met with the patient to discuss DC plana and needs She lives at home with her children Her friend will provide transportation home She needs a RW and a 3 in 1, [employer] will deliver to the bedside,  [facility] is set up for [facility]

## 2022-01-03 NOTE — Evaluation (Signed)
Occupational Therapy Evaluation Patient Details Name: Evelyn Bentley MRN: [id number] DOB: 11/17/1955 Today's Date: 01/03/2022   History of Present Illness 66 yo F s/p R THA, anterior approach, WBAT (01/02/22). PMH significant for arthritis, depression, HTN, hypothyroid   Clinical Impression   Pt seen for OT evaluation this date, POD#1 from above surgery. Pt was independent in all ADLs prior to surgery, and was working and driving. Pt is eager to return to PLOF with less pain and improved safety and independence. Pt currently requires SUPERVISION/SET-UP for seated LB dressing with AD, MIN GUARD for functional mobility of short household distances with RW, MIN GUARD for toilet transfers/hygiene, and SUPERVISION for standing grooming tasks d/t increased pain in R LE, decreased sensation in R LE, and decreased balance. Pt instructed in falls prevention strategies, home/routines modifications, DME/AE for LB bathing and dressing tasks, compression stocking mgt strategies; handout provided and pt & family verbalized understanding. Pt would benefit from additional instruction in self care skills and techniques, with or without assistive devices, to support recall and carryover prior to discharge and maximize return to PLOF. Recommend HHOT upon discharge.        Recommendations for follow up therapy are one component of a multi-disciplinary discharge planning process, led by the attending physician.  Recommendations may be updated based on patient status, additional functional criteria and insurance authorization.   Follow Up Recommendations  Home health OT    Assistance Recommended at Discharge Intermittent Supervision/Assistance  Patient can return home with the following A little help with walking and/or transfers;A little help with bathing/dressing/bathroom;Assistance with cooking/housework;Assist for transportation    Functional Status Assessment  Patient has had a recent decline in their  functional status and demonstrates the ability to make significant improvements in function in a reasonable and predictable amount of time.  Equipment Recommendations  BSC/3in1       Precautions / Restrictions Precautions Precautions: Fall;Anterior Hip Restrictions Weight Bearing Restrictions: Yes RLE Weight Bearing: Weight bearing as tolerated      Mobility Bed Mobility Overal bed mobility: Modified Independent             General bed mobility comments: with HOB slightly elevated    Transfers Overall transfer level: Needs assistance Equipment used: Rolling walker (2 wheels) Transfers: Sit to/from Stand Sit to Stand: Min guard           General transfer comment: Requires cuing for hand/foot placement; increased time      Balance Overall balance assessment: Needs assistance Sitting-balance support: No upper extremity supported, Feet supported Sitting balance-Leahy Scale: Good     Standing balance support: Bilateral upper extremity supported, During functional activity Standing balance-Leahy Scale: Fair Standing balance comment: Requires MIN GUARD to walk household distances with RW                           ADL either performed or assessed with clinical judgement   ADL Overall ADL's : Needs assistance/impaired     Grooming: Wash/dry hands;Supervision/safety;Standing               Lower Body Dressing: Supervision/safety;Set up;Sitting/lateral leans;With adaptive equipment Lower Body Dressing Details (indicate cue type and reason): to don/doff socks with reacher and sock aide Toilet Transfer: Min guard;Ambulation;BSC/3in1;Rolling walker (2 wheels) (BSC frame placed over toilet) Toilet Transfer Details (indicate cue type and reason): Requires cues for safe hand placement with RW use Toileting- Clothing Manipulation and Hygiene: Min guard;Sitting/lateral lean  Functional mobility during ADLs: Min guard;Rolling walker (2 wheels) (to walk  to/from bathroom (37ft, 2x))       Vision Ability to See in Adequate Light: 0 Adequate Patient Visual Report: No change from baseline              Pertinent Vitals/Pain Pain Assessment Pain Assessment: 0-10 Pain Score: 4  Pain Location: R hip Pain Descriptors / Indicators: Aching, Guarding, Grimacing Pain Intervention(s): Monitored during session, Repositioned, Patient requesting pain meds-RN notified        Extremity/Trunk Assessment Upper Extremity Assessment Upper Extremity Assessment: Overall WFL for tasks assessed   Lower Extremity Assessment Lower Extremity Assessment: RLE deficits/detail;Defer to PT evaluation RLE Deficits / Details: s/p R THA. Pt endorsing decreased R LE sensation       Communication Communication Communication: No difficulties   Cognition Arousal/Alertness: Awake/alert Behavior During Therapy: WFL for tasks assessed/performed Overall Cognitive Status: Within Functional Limits for tasks assessed                                                  Home Living Family/patient expects to be discharged to:: Private residence Living Arrangements: Children Available Help at Discharge: Family;Available PRN/intermittently Type of Home: House Home Access: Level entry     Home Layout: One level     Bathroom Shower/Tub: Occupational psychologist: Standard     Home Equipment: None          Prior Functioning/Environment Prior Level of Function : Independent/Modified Independent             Mobility Comments: Indep with household and community mobilization ADLs Comments: Indep with ADLs/ Pt drives and works full-time in [employer]        OT Problem List: Decreased strength;Decreased activity tolerance;Impaired balance (sitting and/or standing);Decreased safety awareness;Decreased knowledge of use of DME or AE;Decreased knowledge of precautions;Impaired sensation;Pain      OT Treatment/Interventions:  Self-care/ADL training;Therapeutic exercise;DME and/or AE instruction;Therapeutic activities;Patient/family education;Balance training    OT Goals(Current goals can be found in the care plan section) Acute Rehab OT Goals Patient Stated Goal: to return home OT Goal Formulation: With patient Time For Goal Achievement: 01/17/22 Potential to Achieve Goals: Good ADL Goals Pt Will Perform Grooming: with modified independence;standing Pt Will Perform Lower Body Dressing: with modified independence;with adaptive equipment;sit to/from stand Pt Will Transfer to Toilet: with modified independence;ambulating;bedside commode  OT Frequency: Min 2X/week       AM-PAC OT "6 Clicks" Daily Activity     Outcome Measure Help from another person eating meals?: None Help from another person taking care of personal grooming?: A Little Help from another person toileting, which includes using toliet, bedpan, or urinal?: A Little Help from another person bathing (including washing, rinsing, drying)?: A Lot Help from another person to put on and taking off regular upper body clothing?: A Little Help from another person to put on and taking off regular lower body clothing?: A Little 6 Click Score: 18   End of Session Equipment Utilized During Treatment: Rolling walker (2 wheels) Nurse Communication: Mobility status;Patient requests pain meds  Activity Tolerance: Patient tolerated treatment well Patient left: in chair;with call bell/phone within reach;with chair alarm set;with family/visitor present  OT Visit Diagnosis: Unsteadiness on feet (R26.81);Muscle weakness (generalized) (M62.81);Pain Pain - Right/Left: Right Pain - part of body: Hip  Time: 9290-9030 OT Time Calculation (min): 27 min Charges:  OT General Charges $OT Visit: 1 Visit OT Evaluation $OT Eval Moderate Complexity: 1 Mod OT Treatments $Self Care/Home Management : 8-22 mins  Fredirick Maudlin, OTR/L [place]

## 2022-01-03 NOTE — Plan of Care (Signed)

## 2022-01-04 LAB — SURGICAL PATHOLOGY

## 2022-01-19 ENCOUNTER — Encounter: Payer: Self-pay | Admitting: Orthopedic Surgery

## 2022-01-21 ENCOUNTER — Emergency Department
Admission: EM | Admit: 2022-01-21 | Discharge: 2022-01-21 | Disposition: A | Discharge status: Home / Self Care | Payer: BC Managed Care – PPO | Attending: Emergency Medicine | Admitting: Emergency Medicine

## 2022-01-21 ENCOUNTER — Encounter: Payer: Self-pay | Admitting: Emergency Medicine

## 2022-01-21 ENCOUNTER — Emergency Department: Payer: BC Managed Care – PPO

## 2022-01-21 DIAGNOSIS — Z96641 Presence of right artificial hip joint: Secondary | ICD-10-CM | POA: Diagnosis not present

## 2022-01-21 DIAGNOSIS — J309 Allergic rhinitis, unspecified: Secondary | ICD-10-CM | POA: Insufficient documentation

## 2022-01-21 DIAGNOSIS — R42 Dizziness and giddiness: Secondary | ICD-10-CM | POA: Insufficient documentation

## 2022-01-21 LAB — URINALYSIS, ROUTINE W REFLEX MICROSCOPIC
Bacteria, UA: NONE SEEN
Bilirubin Urine: NEGATIVE
Glucose, UA: NEGATIVE mg/dL
Ketones, ur: NEGATIVE mg/dL
Leukocytes,Ua: NEGATIVE
Nitrite: NEGATIVE
Protein, ur: NEGATIVE mg/dL
Specific Gravity, Urine: 1.023 (ref 1.005–1.030)
pH: 8 (ref 5.0–8.0)

## 2022-01-21 LAB — TROPONIN I (HIGH SENSITIVITY): Troponin I (High Sensitivity): 3 ng/L (ref <18)

## 2022-01-21 LAB — CBC
HCT: 41.5 % (ref 36.0–46.0)
Hemoglobin: 13.9 g/dL (ref 12.0–15.0)
MCH: 30.3 pg (ref 26.0–34.0)
MCHC: 33.5 g/dL (ref 30.0–36.0)
MCV: 90.4 fL (ref 80.0–100.0)
Platelets: 490 10*3/uL — ABNORMAL HIGH (ref 150–400)
RBC: 4.59 MIL/uL (ref 3.87–5.11)
RDW: 12.4 % (ref 11.5–15.5)
WBC: 7.1 10*3/uL (ref 4.0–10.5)
nRBC: 0 % (ref 0.0–0.2)

## 2022-01-21 LAB — BASIC METABOLIC PANEL
Anion gap: 6 (ref 5–15)
BUN: 7 mg/dL — ABNORMAL LOW (ref 8–23)
CO2: 28 mmol/L (ref 22–32)
Calcium: 9.1 mg/dL (ref 8.9–10.3)
Chloride: 104 mmol/L (ref 98–111)
Creatinine, Ser: 0.76 mg/dL (ref 0.44–1.00)
GFR, Estimated: >60 mL/min (ref >60)
Glucose, Bld: 123 mg/dL — ABNORMAL HIGH (ref 70–99)
Potassium: 3.7 mmol/L (ref 3.5–5.1)
Sodium: 138 mmol/L (ref 135–145)

## 2022-01-21 MED ORDER — MECLIZINE HCL 25 MG PO TABS
25.0000 mg | ORAL_TABLET | Freq: Once | ORAL | Status: AC
  Administered 2022-01-21: 25 mg via ORAL
  Filled 2022-01-21: qty 1

## 2022-01-21 MED ORDER — IOHEXOL 350 MG/ML SOLN
75.0000 mL | Freq: Once | INTRAVENOUS | Status: AC | PRN
  Administered 2022-01-21: 75 mL via INTRAVENOUS
  Filled 2022-01-21: qty 75

## 2022-01-21 MED ORDER — PREDNISONE 50 MG PO TABS: 50.0000 mg | ORAL_TABLET | Freq: Every day | ORAL | 0 refills | Status: DC

## 2022-01-21 MED ORDER — MECLIZINE HCL 25 MG PO TABS: 25.0000 mg | ORAL_TABLET | Freq: Three times a day (TID) | ORAL | 0 refills | Status: DC | PRN

## 2022-01-21 NOTE — ED Notes (Signed)
Patient transported to CT 

## 2022-01-21 NOTE — ED Triage Notes (Signed)
Pt arrived from [facility] with c/o dizziness and unsteadiness with ambulation since 3/11. Pt had rt hip replacement surgery on 2/21 and has been taking Oxymorphone with last dose 3/11. Pt denies SOB, denies swelling/redness to surgical site.  ?

## 2022-01-21 NOTE — ED Provider Notes (Signed)
[facility] Provider Note  Patient Contact: 4:01 PM (approximate)   History   Dizziness   HPI  Evelyn Bentley is a 66 y.o. female who presents the emergency department complaining of sudden onset of dizziness.  Symptoms began roughly 48 hours ago.  Patient feels like everything is spinning around her.  This is positional.  She states that symptoms do improve when she is not moving.  No headache, vision changes, unilateral weakness, slurred speech.  Patient with no history of CVA or TIA.  No history of vertigo.  Patient did recently have surgery to her right hip with a hip replacement which has been healing appropriately with no complications.  Patient was on pain medication and took her last dose 2 days ago, however she states that she was only taking maybe 1 tablet a day over the last 10 days.  Patient denies any URI symptoms, fevers or chills.  She does have issues with her ears chronically with "popping and extra fluid".  Patient denies any chest pain, shortness of breath, palpitations, GI symptoms.     Physical Exam   Triage Vital Signs: ED Triage Vitals  Enc Vitals Group     BP 01/21/22 1523 116/61     Pulse Rate 01/21/22 1523 100     Resp 01/21/22 1523 16     Temp 01/21/22 1523 98.3 F (36.8 C)     Temp Source 01/21/22 1523 Oral     SpO2 01/21/22 1523 97 %     Weight 01/21/22 1513 147 lb (66.7 kg)     Height 01/21/22 1513 5' (1.524 m)     Head Circumference --      Peak Flow --      Pain Score --      Pain Loc --      Pain Edu? --      Excl. in [place]? --     Most recent vital signs: Vitals:   01/21/22 1523  BP: 116/61  Pulse: 100  Resp: 16  Temp: 98.3 F (36.8 C)  SpO2: 97%     General: Alert and in no acute distress. Eyes:  PERRL. EOMI. no nystagmus Head: No acute traumatic findings ENT:      Ears: EACs unremarkable bilaterally PM TMs are bulging bilaterally but no injection      Nose: No congestion/rhinnorhea.      Mouth/Throat:  Mucous membranes are moist. Neck: No stridor. No cervical spine tenderness to palpation.  Cardiovascular:  Good peripheral perfusion Respiratory: Normal respiratory effort without tachypnea or retractions. Lungs CTAB. Good air entry to the bases with no decreased or absent breath sounds. Musculoskeletal: Full range of motion to all extremities.  Neurologic:  No gross focal neurologic deficits are appreciated.  Cranial nerves II through XII are grossly intact.  No pronator drift.  Equal strength upper extremities. Skin:   No rash noted Other:   ED Results / Procedures / Treatments   Labs (all labs ordered are listed, but only abnormal results are displayed) Labs Reviewed  BASIC METABOLIC PANEL - Abnormal; Notable for the following components:      Result Value   Glucose, Bld 123 (*)    BUN 7 (*)    All other components within normal limits  CBC - Abnormal; Notable for the following components:   Platelets 490 (*)    All other components within normal limits  URINALYSIS, ROUTINE W REFLEX MICROSCOPIC  CBG MONITORING, ED  TROPONIN I (HIGH SENSITIVITY)  EKG  ED ECG REPORT I, Charline Bills Eva Vallee,  personally viewed and interpreted this ECG.   Date: 01/21/2022  EKG Time: 1522 hrs.  Rate: 102 bpm  Rhythm: unchanged from previous tracings, sinus tachycardia  Axis: Normal axis  Intervals:none  ST&T Change: No ST elevation or depression noted  Sinus tachycardia, no STEMI, no significant changes from previous EKG from 12/22/2021    RADIOLOGY  I personally viewed and evaluated these images as part of my medical decision making, as well as reviewing the written report by the radiologist.  ED Provider Interpretation: No acute finding on CT scan of the head and neck.  Specifically no evidence of CVA  CT ANGIO HEAD NECK W WO CM  Result Date: 01/21/2022 CLINICAL DATA:  Sudden onset dizziness.  Recent hip arthroplasty. EXAM: CT ANGIOGRAPHY HEAD AND NECK TECHNIQUE: Multidetector  CT imaging of the head and neck was performed using the standard protocol during bolus administration of intravenous contrast. Multiplanar CT image reconstructions and MIPs were obtained to evaluate the vascular anatomy. Carotid stenosis measurements (when applicable) are obtained utilizing NASCET criteria, using the distal internal carotid diameter as the denominator. RADIATION DOSE REDUCTION: This exam was performed according to the departmental dose-optimization program which includes automated exposure control, adjustment of the mA and/or kV according to patient size and/or use of iterative reconstruction technique. CONTRAST:  64m OMNIPAQUE IOHEXOL 350 MG/ML SOLN COMPARISON:  Head MRI 01/25/2020 FINDINGS: CT HEAD FINDINGS Brain: There is no evidence of an acute infarct, intracranial hemorrhage, mass, midline shift, or extra-axial fluid collection. The ventricles and sulci are within normal limits for age. Vascular: Reported below. Skull: No fracture or suspicious osseous lesion. Sinuses: Visualized paranasal sinuses and mastoid air cells are clear. Orbits: Bilateral cataract extraction. Review of the MIP images confirms the above findings CTA NECK FINDINGS Aortic arch: Normal variant aortic arch branching pattern with common origin of the brachiocephalic and left common carotid arteries. Mild atherosclerosis without arch vessel origin stenosis. Right carotid system: Patent with a small amount of calcified and soft plaque at the carotid bifurcation. No evidence of a significant stenosis or dissection. Tortuous proximal ICA. Left carotid system: Patent with a small amount of calcified plaque at the carotid bifurcation. No evidence of a significant stenosis or dissection. Tortuous proximal ICA with retropharyngeal course. Vertebral arteries: Patent with the right being strongly dominant. No evidence of a significant stenosis or dissection. Skeleton: Prior cervical fusion. Solid interbody and facet ankylosis from  C4-C7. Anterior fusion plate and screws at CW0-9 Trace anterolisthesis of C7 on T1. Other neck: No evidence of cervical lymphadenopathy or mass. Upper chest: No apical lung consolidation or mass. Review of the MIP images confirms the above findings CTA HEAD FINDINGS Anterior circulation: The internal carotid arteries are widely patent from skull base to carotid termini with minimal nonstenotic plaque bilaterally. ACAs and MCAs are patent without evidence of a proximal branch occlusion or significant proximal stenosis. No aneurysm is identified. Posterior circulation: The intracranial vertebral arteries are widely patent to the basilar. Patent PICA and SCA origins are seen bilaterally. The basilar artery is patent and mildly small in caliber congenitally without a focal stenosis. There are large right and small left posterior communicating arteries with hypoplasia of the right P1 segment. Both PCAs are patent without evidence of a significant proximal stenosis. No aneurysm is identified. Venous sinuses: Patent. Anatomic variants: Fetal right PCA. Review of the MIP images confirms the above findings IMPRESSION: 1. No evidence of acute intracranial abnormality. 2. Minimal  atherosclerosis in the head and neck without large vessel occlusion, significant stenosis, or aneurysm. 3. Aortic Atherosclerosis (ICD10-I70.0). Electronically Signed   By: Logan Bores M.D.   On: 01/21/2022 16:59    PROCEDURES:  Critical Care performed: No  Procedures   MEDICATIONS ORDERED IN ED: Medications  meclizine (ANTIVERT) tablet 25 mg (has no administration in time range)  iohexol (OMNIPAQUE) 350 MG/ML injection 75 mL (75 mLs Intravenous Contrast Given 01/21/22 1636)     IMPRESSION / MDM / ASSESSMENT AND PLAN / ED COURSE  I reviewed the triage vital signs and the nursing notes.                              Differential diagnosis includes, but is not limited to, vertigo, CVA, medication withdrawal, infection   Patient's  diagnosis is consistent with vertigo.  Patient presents emergency department with symptoms consistent with vertigo.  She does have allergic rhinitis and has had some excess fluid secondary to her eustachian tubes in her ears.  Patient developed vertigo symptoms 2 days ago.  She presents for evaluation.  Neurologically intact on exam.  Given the symptoms and recent surgery I did proceed with a CT angio head and neck to ensure no evidence of CVA or vascular occlusion.  Imaging is reassuring.  Labs are reassuring at this time.  Patient is instructed to take her at home medications for allergies, and I will place the patient a short course of steroid and meclizine.  Follow-up with primary care as needed.  Concerning signs and symptoms are discussed to return to the ED.Marland Kitchen  Patient is given ED precautions to return to the ED for any worsening or new symptoms.        FINAL CLINICAL IMPRESSION(S) / ED DIAGNOSES   Final diagnoses:  Vertigo     Rx / DC Orders   ED Discharge Orders          Ordered    meclizine (ANTIVERT) 25 MG tablet  3 times daily PRN        01/21/22 1726    predniSONE (DELTASONE) 50 MG tablet  Daily with breakfast        01/21/22 1726             Note:  This document was prepared using Dragon voice recognition software and may include unintentional dictation errors.   Brynda Peon 01/21/22 1727    Carrie Mew, MD 01/21/22 2024

## 2022-09-25 ENCOUNTER — Other Ambulatory Visit: Payer: Self-pay | Admitting: Family

## 2022-09-25 DIAGNOSIS — N951 Menopausal and female climacteric states: Secondary | ICD-10-CM

## 2022-09-25 DIAGNOSIS — Z1231 Encounter for screening mammogram for malignant neoplasm of breast: Secondary | ICD-10-CM

## 2022-10-10 ENCOUNTER — Other Ambulatory Visit: Payer: Self-pay | Admitting: Orthopedic Surgery

## 2022-10-10 DIAGNOSIS — M7061 Trochanteric bursitis, right hip: Secondary | ICD-10-CM

## 2022-10-10 DIAGNOSIS — M25551 Pain in right hip: Secondary | ICD-10-CM

## 2022-10-22 ENCOUNTER — Ambulatory Visit
Admission: RE | Admit: 2022-10-22 | Discharge: 2022-10-22 | Disposition: A | Discharge status: Home / Self Care | Payer: BC Managed Care – PPO | Source: Ambulatory Visit | Attending: Orthopedic Surgery | Admitting: Orthopedic Surgery

## 2022-10-22 DIAGNOSIS — M7061 Trochanteric bursitis, right hip: Secondary | ICD-10-CM | POA: Diagnosis present

## 2022-10-22 DIAGNOSIS — M25551 Pain in right hip: Secondary | ICD-10-CM | POA: Insufficient documentation

## 2022-12-04 ENCOUNTER — Other Ambulatory Visit: Payer: Self-pay | Admitting: Orthopedic Surgery

## 2022-12-04 DIAGNOSIS — M4807 Spinal stenosis, lumbosacral region: Secondary | ICD-10-CM

## 2022-12-10 ENCOUNTER — Ambulatory Visit
Admission: RE | Admit: 2022-12-10 | Discharge: 2022-12-10 | Disposition: A | Discharge status: Home / Self Care | Payer: BC Managed Care – PPO | Source: Ambulatory Visit | Attending: Orthopedic Surgery | Admitting: Orthopedic Surgery

## 2022-12-10 DIAGNOSIS — M4807 Spinal stenosis, lumbosacral region: Secondary | ICD-10-CM | POA: Diagnosis not present

## 2022-12-11 ENCOUNTER — Other Ambulatory Visit: Payer: Self-pay | Admitting: Family

## 2022-12-11 DIAGNOSIS — G894 Chronic pain syndrome: Secondary | ICD-10-CM

## 2022-12-11 DIAGNOSIS — M5136 Other intervertebral disc degeneration, lumbar region: Secondary | ICD-10-CM

## 2022-12-11 DIAGNOSIS — G629 Polyneuropathy, unspecified: Secondary | ICD-10-CM

## 2022-12-12 LAB — COMPREHENSIVE METABOLIC PANEL
ALT: 17 IU/L (ref 0–32)
AST: 21 IU/L (ref 0–40)
Albumin/Globulin Ratio: 2 (ref 1.2–2.2)
Albumin: 4.5 g/dL (ref 3.9–4.9)
Alkaline Phosphatase: 89 IU/L (ref 44–121)
BUN/Creatinine Ratio: 15 (ref 12–28)
BUN: 11 mg/dL (ref 8–27)
Bilirubin Total: 0.4 mg/dL (ref 0.0–1.2)
CO2: 21 mmol/L (ref 20–29)
Calcium: 9.5 mg/dL (ref 8.7–10.3)
Chloride: 105 mmol/L (ref 96–106)
Creatinine, Ser: 0.74 mg/dL (ref 0.57–1.00)
Globulin, Total: 2.3 g/dL (ref 1.5–4.5)
Glucose: 110 mg/dL — ABNORMAL HIGH (ref 70–99)
Potassium: 4.3 mmol/L (ref 3.5–5.2)
Sodium: 141 mmol/L (ref 134–144)
Total Protein: 6.8 g/dL (ref 6.0–8.5)
eGFR: 90 mL/min/{1.73_m2} (ref >59)

## 2022-12-12 LAB — IRON,TIBC AND FERRITIN PANEL
Ferritin: 25 ng/mL (ref 15–150)
Iron Saturation: 16 % (ref 15–55)
Iron: 59 ug/dL (ref 27–139)
Total Iron Binding Capacity: 368 ug/dL (ref 250–450)
UIBC: 309 ug/dL (ref 118–369)

## 2022-12-12 LAB — CBC WITH DIFFERENTIAL/PLATELET
Basophils Absolute: 0 10*3/uL (ref 0.0–0.2)
Basos: 0 %
EOS (ABSOLUTE): 0 10*3/uL (ref 0.0–0.4)
Eos: 0 %
Hematocrit: 43.8 % (ref 34.0–46.6)
Hemoglobin: 14.4 g/dL (ref 11.1–15.9)
Immature Grans (Abs): 0 10*3/uL (ref 0.0–0.1)
Immature Granulocytes: 0 %
Lymphocytes Absolute: 1 10*3/uL (ref 0.7–3.1)
Lymphs: 11 %
MCH: 30.2 pg (ref 26.6–33.0)
MCHC: 32.9 g/dL (ref 31.5–35.7)
MCV: 92 fL (ref 79–97)
Monocytes Absolute: 0.2 10*3/uL (ref 0.1–0.9)
Monocytes: 2 %
Neutrophils Absolute: 7.8 10*3/uL — ABNORMAL HIGH (ref 1.4–7.0)
Neutrophils: 87 %
Platelets: 311 10*3/uL (ref 150–450)
RBC: 4.77 x10E6/uL (ref 3.77–5.28)
RDW: 12.7 % (ref 11.7–15.4)
WBC: 9.1 10*3/uL (ref 3.4–10.8)

## 2022-12-12 LAB — TSH+T4F+T3FREE
Free T4: 1.01 ng/dL (ref 0.82–1.77)
T3, Free: 2.7 pg/mL (ref 2.0–4.4)
TSH: 1.5 u[IU]/mL (ref 0.450–4.500)

## 2022-12-12 LAB — VITAMIN D 25 HYDROXY (VIT D DEFICIENCY, FRACTURES): Vit D, 25-Hydroxy: 20.6 ng/mL — ABNORMAL LOW (ref 30.0–100.0)

## 2022-12-12 LAB — VITAMIN B12: Vitamin B-12: 294 pg/mL (ref 232–1245)

## 2022-12-15 LAB — EPSTEIN-BARR VIRUS EARLY D ANTIGEN ANTIBODY, IGG: EBV Early Antigen Ab, IgG: 25.3 U/mL — ABNORMAL HIGH (ref 0.0–8.9)

## 2022-12-15 LAB — MONONUCLEOSIS SCREEN: Mono Screen: NEGATIVE

## 2022-12-15 LAB — SPECIMEN STATUS REPORT

## 2022-12-25 MED ORDER — DEXLANSOPRAZOLE 30 MG PO CPDR: 30.0000 mg | DELAYED_RELEASE_CAPSULE | Freq: Every day | ORAL | 5 refills | Status: DC

## 2022-12-25 MED ORDER — LEVOTHYROXINE SODIUM 125 MCG PO TABS: 125.0000 ug | ORAL_TABLET | Freq: Every day | ORAL | 1 refills | Status: DC

## 2023-01-11 ENCOUNTER — Ambulatory Visit: Payer: BC Managed Care – PPO | Admitting: Family

## 2023-01-11 VITALS — BP 124/78 | HR 90 | Ht 60.0 in | Wt 168.2 lb

## 2023-01-11 DIAGNOSIS — E611 Iron deficiency: Secondary | ICD-10-CM | POA: Diagnosis not present

## 2023-01-11 DIAGNOSIS — R5382 Chronic fatigue, unspecified: Secondary | ICD-10-CM | POA: Diagnosis not present

## 2023-01-11 MED ORDER — ACCRUFER 30 MG PO CAPS: 30.0000 mg | ORAL_CAPSULE | Freq: Two times a day (BID) | ORAL | 3 refills | Status: DC

## 2023-01-14 ENCOUNTER — Encounter: Payer: Self-pay | Admitting: Family

## 2023-01-14 NOTE — Progress Notes (Unsigned)
Established Patient Office Visit  Subjective:  Patient ID: Evelyn Bentley, female    DOB: [birth year]  Age: 66 y.o. MRN: [id number]  Chief Complaint  Patient presents with   Follow-up    1 month follow up    Patient is here for her 3 month f/u.   She has been feeling well since her last appointment.    Due for labs No other concerns at this time  HM Mammogram         Ordered - MAMMOGRAM (Every 2 Years) Ordered on 09/25/2022   06/16/2020  MM 3D SCREEN BREAST BILATERAL   Only the first 1 history entries have been loaded, but more history  exists.          HM PAP         Overdue - PAP SMEAR-Modifier (Every 3 Years) Overdue - never done   No completion history exists for this topic.          HM Colonoscopy         COLONOSCOPY (Pts 45-70yr Insurance coverage will need to be confirmed)  (Every 10 Years) Next due on 11/13/2026   11/13/2016  Surgical Procedure: COLONOSCOPY WITH PROPOFOL   Only the first 1 history entries have been loaded, but more history  exists.     Past Medical History:  Diagnosis Date   Anxiety    Arthritis    Bursitis    Rt hip   Closed fracture of patella 00000000  Complication of anesthesia    a.) PONV. b.) intra/postoperative hypotension 08/2020   Depression    Family history of adverse reaction to anesthesia    a.) mother - severe PONV   Fibromyalgia    GERD (gastroesophageal reflux disease)    H/O arthritis 03/06/2015   H/O: hypothyroidism 03/06/2015   History of 2019 novel coronavirus disease (COVID-19) 12/20/2020   a.) preoperative PCR (+) on 12/20/2020; retested following day per pt/surgeon request; PCR once again (+) on 12/21/2020   History of kidney stones    History of migraine headaches 05/02/2015   Hypercholesteremia    Hypertension    Hypothyroidism    Knee pain 05/06/2017   PONV (postoperative nausea and vomiting)    Pre-diabetes    Sepsis due to [place] tract infection [place] 07/02/2016   Sleep apnea     MILD- DOESN'T NEED cpap   Status post total hip replacement, 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   Thyroid disease    Weakness of limb 05/06/2017    Social History   Socioeconomic History   Marital status: Widowed    Spouse name: Not on file   Number of children: 2   Years of education: Not on file   Highest education level: High school graduate  Occupational History    Comment: fulltime  Tobacco Use   Smoking status: Former    Types: Cigarettes    Start date: 11/14/1974    Quit date: 05/14/2014    Years since quitting: 8.6   Smokeless tobacco: Never  Vaping Use   Vaping Use: Every day   Substances: Nicotine  Substance and Sexual Activity   Alcohol use: Not Currently    Alcohol/week: 0.0 - 1.0 standard drinks of alcohol   Drug use: No   Sexual activity: Yes    Partners: Male    Birth control/protection: Condom  Other Topics Concern   Not on file  Social History Narrative   Not on file   Social Determinants of Health   Financial Resource Strain:  Low Risk  (11/25/2017)   Overall Financial Resource Strain (CARDIA)    Difficulty of Paying Living Expenses: Not hard at all  Food Insecurity: No Food Insecurity (11/25/2017)   Hunger Vital Sign    Worried About Running Out of Food in the Last Year: Never true    Ran Out of Food in the Last Year: Never true  Transportation Needs: No Transportation Needs (11/25/2017)   PRAPARE - Hydrologist (Medical): No    Lack of Transportation (Non-Medical): No  Physical Activity: Inactive (11/25/2017)   Exercise Vital Sign    Days of Exercise per Week: 0 days    Minutes of Exercise per Session: 0 min  Stress: Stress Concern Present (11/25/2017)   [place]    Feeling of Stress : Very much  Social Connections: Moderately Isolated (11/25/2017)   Social Connection and Isolation Panel [NHANES]    Frequency of Communication with Friends and Family: Never    Frequency  of Social Gatherings with Friends and Family: More than three times a week    Attends Religious Services: Never    Marine scientist or Organizations: No    Attends Archivist Meetings: Never    Marital Status: Widowed  Intimate Partner Violence: Not At Risk (11/25/2017)   Humiliation, Afraid, Rape, and Kick questionnaire    Fear of Current or Ex-Partner: No    Emotionally Abused: No    Physically Abused: No    Sexually Abused: No    Family History  Problem Relation Age of Onset   Depression Mother    Anxiety disorder Mother    Breast cancer Neg Hx     Allergies  Allergen Reactions   Codeine Nausea Only    Review of Systems  Constitutional:  Positive for malaise/fatigue.  Psychiatric/Behavioral:  Positive for depression.        Objective:   BP 124/78   Pulse 90   Ht 5' (1.524 m)   Wt 168 lb 3.2 oz (76.3 kg)   SpO2 98%   BMI 32.85 kg/m   Vitals:   01/11/23 0917  BP: 124/78  Pulse: 90  Height: 5' (1.524 m)  Weight: 168 lb 3.2 oz (76.3 kg)  SpO2: 98%  BMI (Calculated): 32.85    Physical Exam Vitals and nursing note reviewed.  Constitutional:      Appearance: Normal appearance. She is normal weight.  HENT:     Head: Normocephalic.  Eyes:     Pupils: Pupils are equal, round, and reactive to light.  Cardiovascular:     Rate and Rhythm: Normal rate and regular rhythm.  Pulmonary:     Effort: Pulmonary effort is normal.     Breath sounds: Normal breath sounds.  Musculoskeletal:        General: Normal range of motion.     Cervical back: Normal range of motion.  Neurological:     General: No focal deficit present.     Mental Status: She is alert and oriented to person, place, and time.  Psychiatric:        Mood and Affect: Mood normal.        Behavior: Behavior normal.        Thought Content: Thought content normal.        Judgment: Judgment normal.      No results found for any visits on 01/11/23.  Recent Results (from the past  2160 hour(s))  Iron, TIBC  and Ferritin Panel     Status: None   Collection Time: 12/11/22 10:52 AM  Result Value Ref Range   Total Iron Binding Capacity 368 250 - 450 ug/dL   UIBC 309 118 - 369 ug/dL   Iron 59 27 - 139 ug/dL   Iron Saturation 16 15 - 55 %   Ferritin 25 15 - 150 ng/mL  CBC with Differential/Platelet     Status: Abnormal   Collection Time: 12/11/22 10:52 AM  Result Value Ref Range   WBC 9.1 3.4 - 10.8 x10E3/uL   RBC 4.77 3.77 - 5.28 x10E6/uL   Hemoglobin 14.4 11.1 - 15.9 g/dL   Hematocrit 43.8 34.0 - 46.6 %   MCV 92 79 - 97 fL   MCH 30.2 26.6 - 33.0 pg   MCHC 32.9 31.5 - 35.7 g/dL   RDW 12.7 11.7 - 15.4 %   Platelets 311 150 - 450 x10E3/uL   Neutrophils 87 Not Estab. %   Lymphs 11 Not Estab. %   Monocytes 2 Not Estab. %   Eos 0 Not Estab. %   Basos 0 Not Estab. %   Neutrophils Absolute 7.8 (H) 1.4 - 7.0 x10E3/uL   Lymphocytes Absolute 1.0 0.7 - 3.1 x10E3/uL   Monocytes Absolute 0.2 0.1 - 0.9 x10E3/uL   EOS (ABSOLUTE) 0.0 0.0 - 0.4 x10E3/uL   Basophils Absolute 0.0 0.0 - 0.2 x10E3/uL   Immature Granulocytes 0 Not Estab. %   Immature Grans (Abs) 0.0 0.0 - 0.1 x10E3/uL  Comprehensive metabolic panel     Status: Abnormal   Collection Time: 12/11/22 10:52 AM  Result Value Ref Range   Glucose 110 (H) 70 - 99 mg/dL   BUN 11 8 - 27 mg/dL   Creatinine, Ser 0.74 0.57 - 1.00 mg/dL   eGFR 90 >59 mL/min/1.73   BUN/Creatinine Ratio 15 12 - 28   Sodium 141 134 - 144 mmol/L   Potassium 4.3 3.5 - 5.2 mmol/L   Chloride 105 96 - 106 mmol/L   CO2 21 20 - 29 mmol/L   Calcium 9.5 8.7 - 10.3 mg/dL   Total Protein 6.8 6.0 - 8.5 g/dL   Albumin 4.5 3.9 - 4.9 g/dL   Globulin, Total 2.3 1.5 - 4.5 g/dL   Albumin/Globulin Ratio 2.0 1.2 - 2.2   Bilirubin Total 0.4 0.0 - 1.2 mg/dL   Alkaline Phosphatase 89 44 - 121 IU/L   AST 21 0 - 40 IU/L   ALT 17 0 - 32 IU/L  TSH+T4F+T3Free     Status: None   Collection Time: 12/11/22 10:52 AM  Result Value Ref Range   TSH 1.500 0.450 -  4.500 uIU/mL   T3, Free 2.7 2.0 - 4.4 pg/mL   Free T4 1.01 0.82 - 1.77 ng/dL  VITAMIN D 25 Hydroxy (Vit-D Deficiency, Fractures)     Status: Abnormal   Collection Time: 12/11/22 10:52 AM  Result Value Ref Range   Vit D, 25-Hydroxy 20.6 (L) 30.0 - 100.0 ng/mL    Comment: Vitamin D deficiency has been defined by the Institute of Medicine and an Endocrine Society practice guideline as a level of serum 25-OH vitamin D less than 20 ng/mL (1,2). The Endocrine Society went on to further define vitamin D insufficiency as a level between 21 and 29 ng/mL (2). 1. IOM (Institute of Medicine). 2010. Dietary reference    intakes for calcium and D. [place]: The    [employer]. 2. Holick MF, Binkley [place], Bischoff-Ferrari HA, et al.  Evaluation, treatment, and prevention of vitamin D    deficiency: an Endocrine Society clinical practice    guideline. JCEM. 2011 Jul; 96(7):1911-30.   Vitamin B12     Status: None   Collection Time: 12/11/22 10:52 AM  Result Value Ref Range   Vitamin B-12 294 232 - 1,245 pg/mL  Epstein-Barr virus early D antigen antibody, IgG     Status: Abnormal   Collection Time: 12/11/22 10:52 AM  Result Value Ref Range   EBV Early Antigen Ab, IgG 25.3 (H) 0.0 - 8.9 U/mL    Comment:                                  Negative        < 9.0                                  Equivocal  9.0 - 10.9                                  Positive        >10.9   Mononucleosis screen     Status: None   Collection Time: 12/11/22 10:52 AM  Result Value Ref Range   Mono Screen Negative Negative    Comment: The sensitivity of Heterophile antibody testing is 80-90%. [facility]ell Patient IgM testing offers higher sensitivity.   Specimen status report     Status: None   Collection Time: 12/11/22 10:52 AM  Result Value Ref Range   specimen status report Comment     Comment: Written Authorization Written Authorization Written Authorization Received. Authorization received from [employer] 12-13-2022 Logged by Leona Carry       Assessment & Plan:   Problem List Items Addressed This Visit   None Visit Diagnoses     Chronic fatigue    -  Primary   Relevant Orders   Celiac panel 10   Coxsackie B Virus Antibodies   Parvovirus B19 Antibody, IGG and IGM   Human parvovirus DNA detection by PCR   Sedimentation rate   CRP (C-Reactive Protein)   Iron deficiency       Relevant Medications   Ferric Maltol (ACCRUFER) 30 MG CAPS       Return in about 1 month (around 02/11/2023).   Total time spent: 30 minutes  Mechele Claude, FNP  01/11/2023

## 2023-01-15 ENCOUNTER — Encounter: Payer: Self-pay | Admitting: Family

## 2023-01-16 LAB — COXSACKIE B VIRUS ANTIBODIES
Coxsackie B-1 Ab: 1:1000 {titer} — ABNORMAL HIGH
Coxsackie B-2 Ab: 1:100 {titer} — ABNORMAL HIGH
Coxsackie B-3 Ab: 1:500 {titer} — ABNORMAL HIGH
Coxsackie B-4 Ab: 1:500 {titer} — ABNORMAL HIGH
Coxsackie B-5 Ab: 1:1000 {titer} — ABNORMAL HIGH
Coxsackie B-6 Ab: 1:500 {titer} — ABNORMAL HIGH

## 2023-01-16 LAB — CELIAC PANEL 10
Antigliadin Abs, IgA: 6 units (ref 0–19)
Endomysial IgA: NEGATIVE
Gliadin IgG: 2 units (ref 0–19)
IgA/Immunoglobulin A, Serum: 320 mg/dL (ref 87–352)
Tissue Transglut Ab: <2 U/mL (ref 0–5)
Transglutaminase IgA: <2 U/mL (ref 0–3)

## 2023-01-16 LAB — SEDIMENTATION RATE: Sed Rate: 2 mm/hr (ref 0–40)

## 2023-01-16 LAB — PARVOVIRUS B19 ANTIBODY, IGG AND IGM
Parvovirus B19 IgG: 6.7 index — ABNORMAL HIGH (ref 0.0–0.8)
Parvovirus B19 IgM: 0.1 index (ref 0.0–0.8)

## 2023-01-16 LAB — C-REACTIVE PROTEIN: CRP: <1 mg/L (ref 0–10)

## 2023-01-16 LAB — HUMAN PARVOVIRUS DNA DETECTION BY PCR: Parvovirus B19, PCR: NEGATIVE

## 2023-01-22 NOTE — Addendum Note (Signed)
Addended by: Georgian Co on: 01/22/2023 06:04 PM   Modules accepted: Orders

## 2023-02-11 ENCOUNTER — Encounter: Payer: Self-pay | Admitting: Family

## 2023-02-11 ENCOUNTER — Ambulatory Visit (INDEPENDENT_AMBULATORY_CARE_PROVIDER_SITE_OTHER): Payer: BC Managed Care – PPO | Admitting: Family

## 2023-02-11 VITALS — BP 122/82 | HR 82 | Ht 60.0 in | Wt 166.6 lb

## 2023-02-11 DIAGNOSIS — M5136 Other intervertebral disc degeneration, lumbar region: Secondary | ICD-10-CM

## 2023-02-11 DIAGNOSIS — G894 Chronic pain syndrome: Secondary | ICD-10-CM

## 2023-02-11 DIAGNOSIS — R5382 Chronic fatigue, unspecified: Secondary | ICD-10-CM | POA: Diagnosis not present

## 2023-02-11 NOTE — Assessment & Plan Note (Signed)
Given that her labs have not given [place] any obvious cause for her fatigue, I believe that she is likely having an exacerbation of her chronic fatigue syndrome.

## 2023-02-11 NOTE — Progress Notes (Signed)
Established Patient Office Visit  Subjective:  Patient ID: Evelyn Bentley, female    DOB: 12-28-1955  Age: 67 y.o. MRN: [id number]  Chief Complaint  Patient presents with   Follow-up    1 month follow up    Patient is here today for her 1 month follow up.  She has been feeling better since last appointment, though she is still not completely back to her normal.   She does not have additional concerns to discuss today.  Labs are not due today. She needs refills.   I have reviewed her active problem list, medication list, allergies, notes from last encounter, and lab results for her appointment today.   No other concerns at this time.   Past Medical History:  Diagnosis Date   Anxiety    Arthritis    Bursitis    Rt hip   Closed fracture of patella 0000000   Complication of anesthesia    a.) PONV. b.) intra/postoperative hypotension 08/2020   Depression    Family history of adverse reaction to anesthesia    a.) mother - severe PONV   Fibromyalgia    GERD (gastroesophageal reflux disease)    H/O arthritis 03/06/2015   H/O: hypothyroidism 03/06/2015   History of 2019 novel coronavirus disease (COVID-19) 12/20/2020   a.) preoperative PCR (+) on 12/20/2020; retested following day per pt/surgeon request; PCR once again (+) on 12/21/2020   History of kidney stones    History of migraine headaches 05/02/2015   Hypercholesteremia    Hypertension    Hypothyroidism    Knee pain 05/06/2017   PONV (postoperative nausea and vomiting)    Pre-diabetes    Sepsis due to urinary tract infection 07/02/2016   Sleep apnea    MILD- DOESN'T NEED cpap   Status post total hip replacement, 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   Thyroid disease    Weakness of limb 05/06/2017    Past Surgical History:  Procedure Laterality Date   ABDOMINAL HYSTERECTOMY     ANTERIOR INTEROSSEOUS NERVE DECOMPRESSION Left 01/05/2021   Procedure: Cubital tunnel release, left;  Surgeon: Hessie Knows, MD;  Location: [facility]  ORS;  Service: Orthopedics;  Laterality: Left;   CARPAL TUNNEL RELEASE Right 09/01/2020   Procedure: Right carpal tunnel release;  Surgeon: Hessie Knows, MD;  Location: [facility] ORS;  Service: Orthopedics;  Laterality: Right;   CARPAL TUNNEL RELEASE Left 01/05/2021   Procedure: Carpal tunnel release, left;  Surgeon: Hessie Knows, MD;  Location: [facility] ORS;  Service: Orthopedics;  Laterality: Left;   CATARACT EXTRACTION, BILATERAL     CHOLECYSTECTOMY     COLONOSCOPY WITH PROPOFOL N/A 11/13/2016   Procedure: COLONOSCOPY WITH PROPOFOL;  Surgeon: Lollie Sails, MD;  Location: [facility] ENDOSCOPY;  Service: Endoscopy;  Laterality: N/A;   ESOPHAGOGASTRODUODENOSCOPY (EGD) WITH PROPOFOL N/A 11/13/2016   Procedure: ESOPHAGOGASTRODUODENOSCOPY (EGD) WITH PROPOFOL;  Surgeon: Lollie Sails, MD;  Location: [facility] ENDOSCOPY;  Service: Endoscopy;  Laterality: N/A;   ESOPHAGOGASTRODUODENOSCOPY (EGD) WITH PROPOFOL N/A 12/16/2018   Procedure: ESOPHAGOGASTRODUODENOSCOPY (EGD) WITH PROPOFOL;  Surgeon: Lollie Sails, MD;  Location: [facility] ENDOSCOPY;  Service: Endoscopy;  Laterality: N/A;   fundoflication  [id number]   at [place] Bilateral 02/28/2016   Procedure: HYSTERECTOMY TOTAL LAPAROSCOPIC / BSO;  Surgeon: Honor Loh Ward, MD;  Location: [facility] ORS;  Service: Gynecology;  Laterality: Bilateral;   NECK SURGERY     X 2   NISSEN FUNDOPLICATION     TOTAL HIP ARTHROPLASTY Right 01/02/2022   Procedure: TOTAL  HIP ARTHROPLASTY ANTERIOR APPROACH;  Surgeon: Hessie Knows, MD;  Location: [facility] ORS;  Service: Orthopedics;  Laterality: Right;   TUBAL LIGATION     URETEROSCOPY WITH HOLMIUM LASER LITHOTRIPSY Left 07/03/2016   Procedure: URETEROSCOPY WITH HOLMIUM LASER LITHOTRIPSY;  Surgeon: Royston Cowper, MD;  Location: [facility] ORS;  Service: Urology;  Laterality: Left;    Social History   Socioeconomic History   Marital status: Widowed    Spouse name: Not on file   Number of children: 2    Years of education: Not on file   Highest education level: High school graduate  Occupational History    Comment: fulltime  Tobacco Use   Smoking status: Former    Types: Cigarettes    Start date: 11/14/1974    Quit date: 05/14/2014    Years since quitting: 8.7   Smokeless tobacco: Never  Vaping Use   Vaping Use: Every day   Substances: Nicotine  Substance and Sexual Activity   Alcohol use: Not Currently    Alcohol/week: 0.0 - 1.0 standard drinks of alcohol   Drug use: No   Sexual activity: Yes    Partners: Male    Birth control/protection: Condom  Other Topics Concern   Not on file  Social History Narrative   Not on file   Social Determinants of Health   Financial Resource Strain: Low Risk  (11/25/2017)   Overall Financial Resource Strain (CARDIA)    Difficulty of Paying Living Expenses: Not hard at all  Food Insecurity: No Food Insecurity (11/25/2017)   Hunger Vital Sign    Worried About Running Out of Food in the Last Year: Never true    [place] in the Last Year: Never true  Transportation Needs: No Transportation Needs (11/25/2017)   PRAPARE - Hydrologist (Medical): No    Lack of Transportation (Non-Medical): No  Physical Activity: Inactive (11/25/2017)   Exercise Vital Sign    Days of Exercise per Week: 0 days    Minutes of Exercise per Session: 0 min  Stress: Stress Concern Present (11/25/2017)   [place]    Feeling of Stress : Very much  Social Connections: Moderately Isolated (11/25/2017)   Social Connection and Isolation Panel [NHANES]    Frequency of Communication with Friends and Family: Never    Frequency of Social Gatherings with Friends and Family: More than three times a week    Attends Religious Services: Never    Marine scientist or Organizations: No    Attends Archivist Meetings: Never    Marital Status: Widowed  Intimate Partner  Violence: Not At Risk (11/25/2017)   Humiliation, Afraid, Rape, and Kick questionnaire    Fear of Current or Ex-Partner: No    Emotionally Abused: No    Physically Abused: No    Sexually Abused: No    Family History  Problem Relation Age of Onset   Depression Mother    Anxiety disorder Mother    Breast cancer Neg Hx     Allergies  Allergen Reactions   Codeine Nausea Only    Review of Systems  Constitutional:  Positive for malaise/fatigue.  Musculoskeletal:  Positive for back pain and myalgias.  All other systems reviewed and are negative.      Objective:   BP 122/82   Pulse 82   Ht 5' (1.524 m)   Wt 166 lb 9.6 oz (75.6 kg)   SpO2  98%   BMI 32.54 kg/m   Vitals:   02/11/23 0923  BP: 122/82  Pulse: 82  Height: 5' (1.524 m)  Weight: 166 lb 9.6 oz (75.6 kg)  SpO2: 98%  BMI (Calculated): 32.54    Physical Exam Vitals and nursing note reviewed.  Constitutional:      Appearance: Normal appearance. She is normal weight.  HENT:     Head: Normocephalic.  Eyes:     Extraocular Movements: Extraocular movements intact.     Pupils: Pupils are equal, round, and reactive to light.  Cardiovascular:     Rate and Rhythm: Normal rate.  Pulmonary:     Effort: Pulmonary effort is normal.     Breath sounds: Normal breath sounds.  Neurological:     Mental Status: She is alert.      No results found for any visits on 02/11/23.  Recent Results (from the past 2160 hour(s))  Iron, TIBC and Ferritin Panel     Status: None   Collection Time: 12/11/22 10:52 AM  Result Value Ref Range   Total Iron Binding Capacity 368 250 - 450 ug/dL   UIBC 309 118 - 369 ug/dL   Iron 59 27 - 139 ug/dL   Iron Saturation 16 15 - 55 %   Ferritin 25 15 - 150 ng/mL  CBC with Differential/Platelet     Status: Abnormal   Collection Time: 12/11/22 10:52 AM  Result Value Ref Range   WBC 9.1 3.4 - 10.8 x10E3/uL   RBC 4.77 3.77 - 5.28 x10E6/uL   Hemoglobin 14.4 11.1 - 15.9 g/dL   Hematocrit  43.8 34.0 - 46.6 %   MCV 92 79 - 97 fL   MCH 30.2 26.6 - 33.0 pg   MCHC 32.9 31.5 - 35.7 g/dL   RDW 12.7 11.7 - 15.4 %   Platelets 311 150 - 450 x10E3/uL   Neutrophils 87 Not Estab. %   Lymphs 11 Not Estab. %   Monocytes 2 Not Estab. %   Eos 0 Not Estab. %   Basos 0 Not Estab. %   Neutrophils Absolute 7.8 (H) 1.4 - 7.0 x10E3/uL   Lymphocytes Absolute 1.0 0.7 - 3.1 x10E3/uL   Monocytes Absolute 0.2 0.1 - 0.9 x10E3/uL   EOS (ABSOLUTE) 0.0 0.0 - 0.4 x10E3/uL   Basophils Absolute 0.0 0.0 - 0.2 x10E3/uL   Immature Granulocytes 0 Not Estab. %   Immature Grans (Abs) 0.0 0.0 - 0.1 x10E3/uL  Comprehensive metabolic panel     Status: Abnormal   Collection Time: 12/11/22 10:52 AM  Result Value Ref Range   Glucose 110 (H) 70 - 99 mg/dL   BUN 11 8 - 27 mg/dL   Creatinine, Ser 0.74 0.57 - 1.00 mg/dL   eGFR 90 >59 mL/min/1.73   BUN/Creatinine Ratio 15 12 - 28   Sodium 141 134 - 144 mmol/L   Potassium 4.3 3.5 - 5.2 mmol/L   Chloride 105 96 - 106 mmol/L   CO2 21 20 - 29 mmol/L   Calcium 9.5 8.7 - 10.3 mg/dL   Total Protein 6.8 6.0 - 8.5 g/dL   Albumin 4.5 3.9 - 4.9 g/dL   Globulin, Total 2.3 1.5 - 4.5 g/dL   Albumin/Globulin Ratio 2.0 1.2 - 2.2   Bilirubin Total 0.4 0.0 - 1.2 mg/dL   Alkaline Phosphatase 89 44 - 121 IU/L   AST 21 0 - 40 IU/L   ALT 17 0 - 32 IU/L  TSH+T4F+T3Free     Status: None  Collection Time: 12/11/22 10:52 AM  Result Value Ref Range   TSH 1.500 0.450 - 4.500 uIU/mL   T3, Free 2.7 2.0 - 4.4 pg/mL   Free T4 1.01 0.82 - 1.77 ng/dL  VITAMIN D 25 Hydroxy (Vit-D Deficiency, Fractures)     Status: Abnormal   Collection Time: 12/11/22 10:52 AM  Result Value Ref Range   Vit D, 25-Hydroxy 20.6 (L) 30.0 - 100.0 ng/mL    Comment: Vitamin D deficiency has been defined by the [place] and an Endocrine Society practice guideline as a level of serum 25-OH vitamin D less than 20 ng/mL (1,2). The Endocrine Society went on to further define vitamin D insufficiency  as a level between 21 and 29 ng/mL (2). 1. IOM (Institute of Medicine). 2010. Dietary reference    intakes for calcium and D. [place]: The    [employer]. 2. Holick MF, Binkley Sangrey, Bischoff-Ferrari HA, et al.    Evaluation, treatment, and prevention of vitamin D    deficiency: an Endocrine Society clinical practice    guideline. JCEM. 2011 Jul; 96(7):1911-30.   Vitamin B12     Status: None   Collection Time: 12/11/22 10:52 AM  Result Value Ref Range   Vitamin B-12 294 232 - 1,245 pg/mL  Epstein-Barr virus early D antigen antibody, IgG     Status: Abnormal   Collection Time: 12/11/22 10:52 AM  Result Value Ref Range   EBV Early Antigen Ab, IgG 25.3 (H) 0.0 - 8.9 U/mL    Comment:                                  Negative        < 9.0                                  Equivocal  9.0 - 10.9                                  Positive        >10.9   Mononucleosis screen     Status: None   Collection Time: 12/11/22 10:52 AM  Result Value Ref Range   Mono Screen Negative Negative    Comment: The sensitivity of Heterophile antibody testing is 80-90%. [place] Patient IgM testing offers higher sensitivity.   Specimen status report     Status: None   Collection Time: 12/11/22 10:52 AM  Result Value Ref Range   specimen status report Comment     Comment: Written Authorization Written Authorization Written Authorization Received. Authorization received from [employer] 12-13-2022 Logged by Leona Carry   Celiac panel 10     Status: None   Collection Time: 01/11/23 10:23 AM  Result Value Ref Range   Antigliadin Abs, IgA 6 0 - 19 units    Comment:                    Negative                   0 - 19                    Weak Positive             20 - 30  Moderate to Strong Positive   >30    Gliadin IgG 2 0 - 19 units    Comment:                    Negative                   0 - 19                    Weak Positive             20 - 30                     Moderate to Strong Positive   >30    Transglutaminase IgA <2 0 - 3 U/mL    Comment:                               Negative        0 -  3                               Weak Positive   4 - 10                               Positive           >10  Tissue Transglutaminase (tTG) has been identified  as the endomysial antigen.  Studies have demonstr-  ated that endomysial IgA antibodies have over 99%  specificity for gluten sensitive enteropathy.    Tissue Transglut Ab <2 0 - 5 U/mL    Comment:                               Negative        0 - 5                               Weak Positive   6 - 9                               Positive           >9    Endomysial IgA Negative Negative   IgA/Immunoglobulin A, Serum 320 87 - 352 mg/dL  Cox[place]ackie B Virus Antibodies     Status: Abnormal   Collection Time: 01/11/23 10:23 AM  Result Value Ref Range   [place] B-1 Ab 1:1000 (H) Neg:<1:100   [place] B-2 Ab 1:100 (H) Neg:<1:100   [place] B-3 Ab 1:500 (H) Neg:<1:100   [place]ie B-4 Ab 1:500 (H) Neg:<1:100   [place]ox[place] B-5 Ab 1:1000 (H) Neg:<1:100   [place]ox[place]ie B-6 Ab 1:500 (H) Neg:<1:100  Parvovirus B19 Antibody, IGG and IGM     Status: Abnormal   Collection Time: 01/11/23 10:23 AM  Result Value Ref Range   Parvovirus B19 IgG 6.7 (H) 0.0 - 0.8 index    Comment:                                  Negative        <0.9  Equivocal  0.9 - 1.1                                  Positive        >1.1    Parvovirus B19 IgM 0.1 0.0 - 0.8 index    Comment:                                  Negative        <0.9                                  Equivocal  0.9 - 1.1                                  Positive        >1.1   Human parvovirus DNA detection by PCR     Status: None   Collection Time: 01/11/23 10:23 AM  Result Value Ref Range   Parvovirus B19, PCR Negative Negative    Comment: No Parvovirus B19 DNA detected. This test was developed and its performance  characteristics determined by [employer]. It has not been cleared or approved by the U.S. Food and Drug Administration. The FDA has determined that such clearance or approval is not necessary. This test is used for clinical purposes. It should not be regarded as investigational or research.   Sedimentation rate     Status: None   Collection Time: 01/11/23 10:23 AM  Result Value Ref Range   Sed Rate 2 0 - 40 mm/hr  CRP (C-Reactive Protein)     Status: None   Collection Time: 01/11/23 10:23 AM  Result Value Ref Range   CRP <1 0 - 10 mg/L      Assessment & Plan:   Problem List Items Addressed This Visit     Lumbar degenerative disc disease - Primary    Sending referral for pain clinic to new provider as she has not heard from the previous referral.       Relevant Orders   Ambulatory referral to Pain Clinic   Chronic fatigue    Given that her labs have not given [place] any obvious cause for her fatigue, I believe that she is likely having an exacerbation of her chronic fatigue syndrome.       Chronic pain syndrome   Relevant Orders   Ambulatory referral to Pain Clinic    Return in about 6 weeks (around 03/25/2023) for F/U.   Total time spent: 20 minutes  Mechele Claude, FNP  02/11/2023

## 2023-02-11 NOTE — Assessment & Plan Note (Signed)
Sending referral for pain clinic to new provider as she has not heard from the previous referral.

## 2023-03-03 DIAGNOSIS — Z789 Other specified health status: Secondary | ICD-10-CM | POA: Insufficient documentation

## 2023-03-03 DIAGNOSIS — Z79899 Other long term (current) drug therapy: Secondary | ICD-10-CM | POA: Insufficient documentation

## 2023-03-03 DIAGNOSIS — M899 Disorder of bone, unspecified: Secondary | ICD-10-CM | POA: Insufficient documentation

## 2023-03-03 NOTE — Progress Notes (Unsigned)
Patient: Evelyn Bentley  Service Category: E/M  Provider: Oswaldo Done, MD  DOB: [birth year]  DOS: 03/04/2023  Referring Provider: Miki Kins, FNP  MRN: [id number]  Setting: Ambulatory outpatient  PCP: Miki Kins, FNP  Type: New Patient  Specialty: Interventional Pain Management    Location: Office  Delivery: Face-to-face     Primary Reason(s) for Visit: Encounter for initial evaluation of one or more chronic problems (new to examiner) potentially causing chronic pain, and posing a threat to normal musculoskeletal function. (Level of risk: High) CC: No chief complaint on file.  HPI  Evelyn Bentley is a 67 y.o. year old, female patient, who comes for the first time to our practice referred by Miki Kins, FNP for our initial evaluation of her chronic pain. She has Fibromyalgia; MDD (major depressive disorder), recurrent episode, moderate; Ureterolithiasis; Gastroesophageal reflux disease; Bursitis of hip; Eustachian tube dysfunction; Bilateral sensorineural hearing loss; Panic attack; Insomnia due to mental condition; Pain due to onychomycosis of toenails of both feet; Corns and callosities; Bilateral carpal tunnel syndrome; Neuropathy; Cognitive disorder; GAD (generalized anxiety disorder); Numbness and tingling; Hypothyroidism; Benign essential hypertension; Lumbar degenerative disc disease; Primary osteoarthritis of both hands; Chronic fatigue; Chronic pain syndrome; Pharmacologic therapy; Disorder of skeletal system; and Problems influencing health status on their problem list. Today she comes in for evaluation of her No chief complaint on file.  Pain Assessment: Location:     Radiating:   Onset:   Duration:   Quality:   Severity:  /10 (subjective, self-reported pain score)  Effect on ADL:   Timing:   Modifying factors:   BP:    HR:    Onset and Duration: {Hx; Onset and Duration:210120511} Cause of pain: {Hx; Cause:210120521} Severity: {Pain  Severity:210120502} Timing: {Symptoms; Timing:210120501} Aggravating Factors: {Causes; Aggravating pain factors:210120507} Alleviating Factors: {Causes; Alleviating Factors:210120500} Associated Problems: {Hx; Associated problems:210120515} Quality of Pain: {Hx; Symptom quality or Descriptor:210120531} Previous Examinations or Tests: {Hx; Previous examinations or test:210120529} Previous Treatments: {Hx; Previous Treatment:210120503}  Evelyn Bentley is being evaluated for possible interventional pain management therapies for the treatment of her chronic pain.   ***  Evelyn Bentley has been informed that this initial visit was an evaluation only.  On the follow up appointment I will go over the results, including ordered tests and available interventional therapies. At that time she will have the opportunity to decide whether to proceed with offered therapies or not. In the event that Evelyn Bentley prefers avoiding interventional options, this will conclude our involvement in the case.  Medication management recommendations may be provided upon request.  Historic Controlled Substance Pharmacotherapy Review  PMP and historical list of controlled substances: ***  Most recently prescribed opioid analgesics:   *** MME/day: *** mg/day  Historical Monitoring: The patient  reports no history of drug use. List of prior UDS Testing: No results found for: "MDMA", "COCAINSCRNUR", "PCPSCRNUR", "PCPQUANT", "CANNABQUANT", "THCU", "ETH", "CBDTHCR", "D8THCCBX", "D9THCCBX" Historical Background Evaluation: [place] PMP: PDMP reviewed during this encounter. Review of the past 67-months conducted.             PMP NARX Score Report:  Narcotic: 090 Sedative: 040 Stimulant: 000 [place] Department of public safety, offender search: Engineer, mining Information) Non-contributory Risk Assessment Profile: Aberrant behavior: None observed or detected today Risk factors for fatal opioid overdose: None identified today PMP NARX Overdose  Risk Score: 150 Fatal overdose hazard ratio (HR): Calculation deferred Non-fatal overdose hazard ratio (HR): Calculation deferred Risk of opioid abuse or dependence: 0.7-3.0% with doses ?  36 MME/day and 6.1-26% with doses ? 120 MME/day. Substance use disorder (SUD) risk level: See below Personal History of Substance Abuse (SUD-Substance use disorder):  Alcohol:    Illegal Drugs:    Rx Drugs:    ORT Risk Level calculation:    ORT Scoring interpretation table:  Score <3 = Low Risk for SUD  Score between 4-7 = Moderate Risk for SUD  Score >8 = High Risk for Opioid Abuse   PHQ-2 Depression Scale:  Total score:    PHQ-2 Scoring interpretation table: (Score and probability of major depressive disorder)  Score 0 = No depression  Score 1 = 15.4% Probability  Score 2 = 21.1% Probability  Score 3 = 38.4% Probability  Score 4 = 45.5% Probability  Score 5 = 56.4% Probability  Score 6 = 78.6% Probability   PHQ-9 Depression Scale:  Total score:    PHQ-9 Scoring interpretation table:  Score 0-4 = No depression  Score 5-9 = Mild depression  Score 10-14 = Moderate depression  Score 15-19 = Moderately severe depression  Score 20-27 = Severe depression (2.4 times higher risk of SUD and 2.89 times higher risk of overuse)   Pharmacologic Plan: As per protocol, I have not taken over any controlled substance management, pending the results of ordered tests and/or consults.            Initial impression: Pending review of available data and ordered tests.  Meds   Current Outpatient Medications:    acetaminophen (TYLENOL) 650 MG CR tablet, Take 1,950 mg by mouth every 8 (eight) hours as needed for pain., Disp: , Rfl:    azelastine (ASTELIN) 0.1 % nasal spray, Place 1 spray into both nostrils 2 (two) times daily as needed for allergies. , Disp: , Rfl:    bisacodyl (DULCOLAX) 5 MG EC tablet, Take 1 tablet (5 mg total) by mouth daily as needed for moderate constipation. (Patient not taking: Reported  on 02/11/2023), Disp: 30 tablet, Rfl: 0   buPROPion (WELLBUTRIN XL) 300 MG 24 hr tablet, TAKE 1 TABLET BY MOUTH EVERY DAY (Patient not taking: Reported on 02/11/2023), Disp: 90 tablet, Rfl: 1   busPIRone (BUSPAR) 10 MG tablet, TAKE 1 TABLET (10 MG TOTAL) BY MOUTH DAILY AS NEEDED. FOR ANXIETY (Patient not taking: Reported on 02/11/2023), Disp: 90 tablet, Rfl: 0   celecoxib (CELEBREX) 100 MG capsule, Take 100 mg by mouth daily., Disp: , Rfl:    chlorpheniramine (CHLOR-TRIMETON) 4 MG tablet, Take 4 mg by mouth 2 (two) times daily as needed for allergies., Disp: , Rfl:    Dexlansoprazole (DEXILANT) 30 MG capsule DR, Take 1 capsule (30 mg total) by mouth daily. (Patient not taking: Reported on 02/11/2023), Disp: 30 capsule, Rfl: 5   enoxaparin (LOVENOX) 40 MG/0.4ML injection, Inject 0.4 mLs (40 mg total) into the skin daily for 14 days. (Patient not taking: Reported on 02/11/2023), Disp: 5.6 mL, Rfl: 0   Ferric Maltol (ACCRUFER) 30 MG CAPS, Take 1 capsule (30 mg total) by mouth 2 (two) times daily before a meal. (Patient not taking: Reported on 02/11/2023), Disp: 60 capsule, Rfl: 3   fluticasone (FLONASE) 50 MCG/ACT nasal spray, Place 1 spray into both nostrils daily. (Patient not taking: Reported on 02/11/2023), Disp: , Rfl:    hydrOXYzine (ATARAX/VISTARIL) 10 MG tablet, TAKE 1-2 TABLETS (10-20 MG TOTAL) BY MOUTH 2 (TWO) TIMES DAILY AS NEEDED (SEVERE ANXIETY SX). (Patient not taking: Reported on 02/11/2023), Disp: 120 tablet, Rfl: 11   levothyroxine (SYNTHROID) 125 MCG tablet, Take  1 tablet (125 mcg total) by mouth daily before breakfast., Disp: 90 tablet, Rfl: 1   lisinopril-hydrochlorothiazide (PRINZIDE,ZESTORETIC) 10-12.5 MG tablet, Take 1 tablet by mouth daily. , Disp: , Rfl: 2   meclizine (ANTIVERT) 25 MG tablet, Take 1 tablet (25 mg total) by mouth 3 (three) times daily as needed for dizziness. (Patient not taking: Reported on 02/11/2023), Disp: 30 tablet, Rfl: 0   meperidine (DEMEROL) 50 MG tablet, Take 2 tablets (100  mg total) by mouth every 4 (four) hours as needed for moderate pain. (Patient not taking: Reported on 02/11/2023), Disp: 30 tablet, Rfl: 0   methocarbamol (ROBAXIN) 500 MG tablet, Take 1 tablet (500 mg total) by mouth every 6 (six) hours as needed for muscle spasms. (Patient not taking: Reported on 02/11/2023), Disp: 30 tablet, Rfl: 0   Multiple Vitamins-Minerals (MULTIVITAMIN WITH MINERALS) tablet, Take 1 tablet by mouth daily. (Patient not taking: Reported on 02/11/2023), Disp: , Rfl:    NALTREXONE HCL PO, Take 4.5 mg by mouth daily. (Patient not taking: Reported on 02/11/2023), Disp: , Rfl:    nortriptyline (PAMELOR) 25 MG capsule, Take 25 mg by mouth at bedtime. (Patient not taking: Reported on 02/11/2023), Disp: , Rfl:    polyethylene glycol (MIRALAX / GLYCOLAX) 17 g packet, Take 17 g by mouth daily as needed for mild constipation., Disp: 14 each, Rfl: 0   polyvinyl alcohol (LIQUIFILM TEARS) 1.4 % ophthalmic solution, Place 1 drop into both eyes as needed for dry eyes. (Patient not taking: Reported on 02/11/2023), Disp: , Rfl:    predniSONE (DELTASONE) 50 MG tablet, Take 1 tablet (50 mg total) by mouth daily with breakfast. (Patient not taking: Reported on 02/11/2023), Disp: 5 tablet, Rfl: 0   rosuvastatin (CRESTOR) 20 MG tablet, Take 20 mg by mouth daily., Disp: , Rfl:    traZODone (DESYREL) 50 MG tablet, Take 50 mg by mouth at bedtime. (Patient not taking: Reported on 02/11/2023), Disp: , Rfl:    TURMERIC PO, Take 100 mg by mouth daily. (Patient not taking: Reported on 02/11/2023), Disp: , Rfl:    Vitamin D, Ergocalciferol, (DRISDOL) 50000 units CAPS capsule, Take 50,000 Units by mouth every Wednesday., Disp: , Rfl: 3  Imaging Review  Cervical Imaging: Cervical MR wo contrast: Results for orders placed during the hospital encounter of 05/10/21  MR CERVICAL SPINE WO CONTRAST  Narrative CLINICAL DATA:  Bilateral hand numbness.  History of cervical fusion  EXAM: MRI CERVICAL SPINE WITHOUT  CONTRAST  TECHNIQUE: Multiplanar, multisequence MR imaging of the cervical spine was performed. No intravenous contrast was administered.  COMPARISON:  Cervical MRI 12/30/2018  FINDINGS: Alignment: Normal  Vertebrae: Negative for fracture or mass  Cord: Mild cord hyperintensity at C5, unchanged.  Posterior Fossa, vertebral arteries, paraspinal tissues: Negative  Disc levels:  C2-3: Mild left foraminal narrowing due to facet hypertrophy  C3-4: Small central disc protrusion unchanged. Mild spinal stenosis. Bilateral facet degeneration. No significant foraminal stenosis  C4-5: ACDF with anterior plate. Mild left foraminal narrowing. Spinal canal adequate in size  C5-6: Solid interbody fusion without plate. Negative for spinal or foraminal stenosis  C6-7: Solid interbody fusion without anterior plate. Negative for spinal or foraminal stenosis  C7-T1: Mild disc and mild facet degeneration.  Negative for stenosis  T1-2: Central disc protrusion without stenosis.  IMPRESSION: Cervical fusion C4 through C7. 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  No change from the prior MRI  Electronically Signed By: Marlan Palau M.D. On: 05/10/2021 14:04  Cervical MR wo contrast: No valid procedures specified. Cervical MR w/wo contrast: No results found for this or any previous visit.  Cervical MR w contrast: No results found for this or any previous visit.  Cervical CT wo contrast: No results found for this or any previous visit.  Cervical CT w/wo contrast: No results found for this or any previous visit.  Cervical CT w/wo contrast: No results found for this or any previous visit.  Cervical CT w contrast: No results found for this or any previous visit.  Cervical CT outside: No results found for this or any previous visit.  Cervical DG 1 view: Results for orders placed during  the hospital encounter of 01/04/09  DG Cervical Spine 1 View  Narrative Clinical Data: Postop cervical fusion.  Neck pain.  CERVICAL SPINE - 1 VIEW  Comparison: Intraoperative radiographs 12/09/2008.  Findings: The cervical alignment is stable.  The anterior plate and screws and intervertebral bone plug at C4-C5 are unchanged.  There has been previous anterior fusion from C5-C7 with removal of the hardware at those levels.  The lower cervical fusion appears solid. There may be mild residual prevertebral soft tissue swelling inferiorly.  IMPRESSION:  1.  Intact hardware and stable alignment status post recent revision of cervical fusion. 2.  Possible mild residual inferior prevertebral soft tissue swelling. 3.  No acute osseous findings.  Provider: Arleta Creek, Marca Ancona  Cervical DG 2-3 views: No results found for this or any previous visit.  Cervical DG F/E views: No results found for this or any previous visit.  Cervical DG 2-3 clearing views: No results found for this or any previous visit.  Cervical DG Bending/F/E views: No results found for this or any previous visit.  Cervical DG complete: No results found for this or any previous visit.  Cervical DG Myelogram views: No results found for this or any previous visit.  Cervical DG Myelogram views: No results found for this or any previous visit.  Cervical Discogram views: No results found for this or any previous visit.   Shoulder Imaging: Shoulder-R MR w contrast: No results found for this or any previous visit.  Shoulder-L MR w contrast: No results found for this or any previous visit.  Shoulder-R MR w/wo contrast: No results found for this or any previous visit.  Shoulder-L MR w/wo contrast: No results found for this or any previous visit.  Shoulder-R MR wo contrast: No results found for this or any previous visit.  Shoulder-L MR wo contrast: No results found for this or any previous visit.  Shoulder-R  CT w contrast: No results found for this or any previous visit.  Shoulder-L CT w contrast: No results found for this or any previous visit.  Shoulder-R CT w/wo contrast: No results found for this or any previous visit.  Shoulder-L CT w/wo contrast: No results found for this or any previous visit.  Shoulder-R CT wo contrast: No results found for this or any previous visit.  Shoulder-L CT wo contrast: No results found for this or any previous visit.  Shoulder-R DG Arthrogram: No results found for this or any previous visit.  Shoulder-L DG Arthrogram: No results found for this or any previous visit.  Shoulder-R DG 1 view: No results found for this or any previous visit.  Shoulder-L DG 1 view: No results found for this or any previous visit.  Shoulder-R DG: No results found for this or any previous  visit.  Shoulder-L DG: No results found for this or any previous visit.   Thoracic Imaging: Thoracic MR wo contrast: No results found for this or any previous visit.  Thoracic MR wo contrast: No valid procedures specified. Thoracic MR w/wo contrast: No results found for this or any previous visit.  Thoracic MR w contrast: No results found for this or any previous visit.  Thoracic CT wo contrast: No results found for this or any previous visit.  Thoracic CT w/wo contrast: No results found for this or any previous visit.  Thoracic CT w/wo contrast: No results found for this or any previous visit.  Thoracic CT w contrast: No results found for this or any previous visit.  Thoracic DG 2-3 views: No results found for this or any previous visit.  Thoracic DG 4 views: No results found for this or any previous visit.  Thoracic DG: No results found for this or any previous visit.  Thoracic DG w/swimmers view: No results found for this or any previous visit.  Thoracic DG Myelogram views: No results found for this or any previous visit.  Thoracic DG Myelogram views: No results found for  this or any previous visit.   Lumbosacral Imaging: Lumbar MR wo contrast: Results for orders placed during the hospital encounter of 12/10/22  MR LUMBAR SPINE WO CONTRAST  Narrative CLINICAL DATA:  Low back pain extending into the right hip for 6 months. Right leg numbness  EXAM: MRI LUMBAR SPINE WITHOUT CONTRAST  TECHNIQUE: Multiplanar, multisequence MR imaging of the lumbar spine was performed. No intravenous contrast was administered.  COMPARISON:  03/07/2019  FINDINGS: Segmentation:  Standard.  Alignment:  Mild degenerative anterolisthesis at L3-4 and L4-5  Vertebrae:  No fracture, evidence of discitis, or bone lesion.  Conus medullaris and cauda equina: Conus extends to the L1 level. Conus and cauda equina appear normal.  Paraspinal and other soft tissues: Negative for perispinal mass or inflammation.  Disc levels:  T12- L1: Mild progression of chronic central protrusion. No nerve root compression  L1-L2: Small left paracentral protrusion with mild interval regression. Negative facets  L2-L3: Disc bulging eccentric to the left with inferior foraminal protrusion. Mild facet spurring. Mild left foraminal narrowing.  L3-L4: Degenerative facet spurring. Disc space narrowing and bulging. Mild triangular narrowing of the thecal sac.  L4-L5: Degenerative facet spurring with mild anterolisthesis. Mild disc bulging with left foraminal annular fissure. No neural compression.  L5-S1:Degenerative facet spurring.  Negative disc.  IMPRESSION: 1. Generalized lumbar spine degeneration with mild L3-4 and L4-5 anterolisthesis. 2. T12-L1 mild progression of the central disc protrusion compared to 2020, but no neural compression. 3. L2-3 mild left foraminal narrowing   Electronically Signed By: Tiburcio Pea M.D. On: 12/11/2022 05:56  Lumbar MR wo contrast: No valid procedures specified. Lumbar MR w/wo contrast: No results found for this or any previous  visit.  Lumbar MR w/wo contrast: No results found for this or any previous visit.  Lumbar MR w contrast: No results found for this or any previous visit.  Lumbar CT wo contrast: No results found for this or any previous visit.  Lumbar CT w/wo contrast: No results found for this or any previous visit.  Lumbar CT w/wo contrast: No results found for this or any previous visit.  Lumbar CT w contrast: No results found for this or any previous visit.  Lumbar DG 1V: No results found for this or any previous visit.  Lumbar DG 1V (Clearing): No results found for this or  any previous visit.  Lumbar DG 2-3V (Clearing): No results found for this or any previous visit.  Lumbar DG 2-3 views: No results found for this or any previous visit.  Lumbar DG (Complete) 4+V: No results found for this or any previous visit.        Lumbar DG F/E views: No results found for this or any previous visit.        Lumbar DG Bending views: No results found for this or any previous visit.        Lumbar DG Myelogram views: No results found for this or any previous visit.  Lumbar DG Myelogram: No results found for this or any previous visit.  Lumbar DG Myelogram: No results found for this or any previous visit.  Lumbar DG Myelogram: No results found for this or any previous visit.  Lumbar DG Myelogram Lumbosacral: No results found for this or any previous visit.  Lumbar DG Diskogram views: No results found for this or any previous visit.  Lumbar DG Diskogram views: No results found for this or any previous visit.  Lumbar DG Epidurogram OP: No results found for this or any previous visit.  Lumbar DG Epidurogram IP: No valid procedures specified.  Sacroiliac Joint Imaging: Sacroiliac Joint DG: No results found for this or any previous visit.  Sacroiliac Joint MR w/wo contrast: No results found for this or any previous visit.  Sacroiliac Joint MR wo contrast: No results found for this or any previous  visit.   Spine Imaging: Whole Spine DG Myelogram views: No results found for this or any previous visit.  Whole Spine MR Mets screen: No results found for this or any previous visit.  Whole Spine MR Mets screen: No results found for this or any previous visit.  Whole Spine MR w/wo: No results found for this or any previous visit.  MRA Spinal Canal w/ cm: No results found for this or any previous visit.  MRA Spinal Canal wo/ cm: No valid procedures specified. MRA Spinal Canal w/wo cm: No results found for this or any previous visit.  Spine Outside MR Films: No results found for this or any previous visit.  Spine Outside CT Films: No results found for this or any previous visit.  CT-Guided Biopsy: No results found for this or any previous visit.  CT-Guided Needle Placement: No results found for this or any previous visit.  DG Spine outside: No results found for this or any previous visit.  IR Spine outside: No results found for this or any previous visit.  NM Spine outside: No results found for this or any previous visit.   Hip Imaging: Hip-R MR w contrast: No results found for this or any previous visit.  Hip-L MR w contrast: No results found for this or any previous visit.  Hip-R MR w/wo contrast: No results found for this or any previous visit.  Hip-L MR w/wo contrast: No results found for this or any previous visit.  Hip-R MR wo contrast: Results for orders placed during the hospital encounter of 10/22/22  MR HIP RIGHT WO CONTRAST  Narrative CLINICAL DATA:  Right hip pain, hip replacement 1 year ago. Persistent pain which travels down the right thigh. No known injury.  EXAM: MR OF THE RIGHT HIP WITHOUT CONTRAST  TECHNIQUE: Multiplanar, multisequence MR imaging was performed. No intravenous contrast was administered.  COMPARISON:  Radiographs dated January 02, 2022  FINDINGS: Bones: Status post right hip arthroplasty. No evidence perihardware loosening or  edema. No appreciable  joint effusion. Sacroiliac joints and pubic symphysis are intact. No edema inflammatory changes.  Muscles and tendons  Muscles and tendons: Muscles are normal in bulk. No edema or inflammatory changes. No evidence of tendon tear.  Other findings  Miscellaneous: No evidence of bursitis. Skin and subcutaneous soft tissues are within normal limits. Colonic diverticulosis.  IMPRESSION: 1. Status post right hip arthroplasty. No evidence of perihardware loosening or edema. No joint effusion. 2. Muscles and tendons are within normal limits. No evidence of tendon tear. 3. Colonic diverticulosis.   Electronically Signed By: Larose Hires D.O. On: 10/22/2022 16:31  Hip-L MR wo contrast: No results found for this or any previous visit.  Hip-R CT w contrast: No results found for this or any previous visit.  Hip-L CT w contrast: No results found for this or any previous visit.  Hip-R CT w/wo contrast: No results found for this or any previous visit.  Hip-L CT w/wo contrast: No results found for this or any previous visit.  Hip-R CT wo contrast: No results found for this or any previous visit.  Hip-L CT wo contrast: No results found for this or any previous visit.  Hip-R DG 2-3 views: Results for orders placed during the hospital encounter of 01/02/22  DG HIP UNILAT W OR W/O PELVIS 2-3 VIEWS RIGHT  Narrative CLINICAL DATA:  Status post right hip replacement.  Postop pain.  EXAM: DG HIP (WITH OR WITHOUT PELVIS) 2-3V RIGHT  COMPARISON:  Intraoperative images earlier today  FINDINGS: AP and lateral views demonstrate right hip arthroplasty with overlying soft tissue gas and surgical staples. No hardware complication or periprosthetic fracture.  IMPRESSION: Expected appearance after right hip arthroplasty.   Electronically Signed By: Jeronimo Greaves M.D. On: 01/02/2022 09:29  Hip-L DG 2-3 views: No results found for this or any previous visit.  Hip-R  DG Arthrogram: No results found for this or any previous visit.  Hip-L DG Arthrogram: No results found for this or any previous visit.  Hip-B DG Bilateral: No results found for this or any previous visit.   Knee Imaging: Knee-R MR w contrast: No results found for this or any previous visit.  Knee-L MR w/o contrast: No results found for this or any previous visit.  Knee-R MR w/wo contrast: No results found for this or any previous visit.  Knee-L MR w/wo contrast: No results found for this or any previous visit.  Knee-R MR wo contrast: No results found for this or any previous visit.  Knee-L MR wo contrast: No results found for this or any previous visit.  Knee-R CT w contrast: No results found for this or any previous visit.  Knee-L CT w contrast: No results found for this or any previous visit.  Knee-R CT w/wo contrast: No results found for this or any previous visit.  Knee-L CT w/wo contrast: No results found for this or any previous visit.  Knee-R CT wo contrast: No results found for this or any previous visit.  Knee-L CT wo contrast: No results found for this or any previous visit.  Knee-R DG 1-2 views: No results found for this or any previous visit.  Knee-L DG 1-2 views: No results found for this or any previous visit.  Knee-R DG 3 views: No results found for this or any previous visit.  Knee-L DG 3 views: No results found for this or any previous visit.  Knee-R DG 4 views: No results found for this or any previous visit.  Knee-L DG 4 views:  No results found for this or any previous visit.  Knee-R DG Arthrogram: No results found for this or any previous visit.  Knee-L DG Arthrogram: No results found for this or any previous visit.   Ankle Imaging: Ankle-R DG Complete: No results found for this or any previous visit.  Ankle-L DG Complete: No results found for this or any previous visit.   Foot Imaging: Foot-R DG Complete: No results found for this or any  previous visit.  Foot-L DG Complete: No results found for this or any previous visit.   Elbow Imaging: Elbow-R DG Complete: No results found for this or any previous visit.  Elbow-L DG Complete: No results found for this or any previous visit.   Wrist Imaging: Wrist-R DG Complete: No results found for this or any previous visit.  Wrist-L DG Complete: No results found for this or any previous visit.   Hand Imaging: Hand-R DG Complete: No results found for this or any previous visit.  Hand-L DG Complete: No results found for this or any previous visit.   Complexity Note: Imaging results reviewed.                         ROS  Cardiovascular: {Hx; Cardiovascular History:210120525} Pulmonary or Respiratory: {Hx; Pumonary and/or Respiratory History:210120523} Neurological: {Hx; Neurological:210120504} Psychological-Psychiatric: {Hx; Psychological-Psychiatric History:210120512} Gastrointestinal: {Hx; Gastrointestinal:210120527} Genitourinary: {Hx; Genitourinary:210120506} Hematological: {Hx; Hematological:210120510} Endocrine: {Hx; Endocrine history:210120509} Rheumatologic: {Hx; Rheumatological:210120530} Musculoskeletal: {Hx; Musculoskeletal:210120528} Work History: {Hx; Work history:210120514}  Allergies  Evelyn Bentley is allergic to codeine.  Laboratory Chemistry Profile   Renal Lab Results  Component Value Date   BUN 11 12/11/2022   CREATININE 0.74 12/11/2022   BCR 15 12/11/2022   GFRAA >60 07/14/2016   GFRNONAA >60 01/21/2022   PROTEINUR NEGATIVE 01/21/2022     Electrolytes Lab Results  Component Value Date   NA 141 12/11/2022   K 4.3 12/11/2022   CL 105 12/11/2022   CALCIUM 9.5 12/11/2022     Hepatic Lab Results  Component Value Date   AST 21 12/11/2022   ALT 17 12/11/2022   ALBUMIN 4.5 12/11/2022   ALKPHOS 89 12/11/2022     ID Lab Results  Component Value Date   SARSCOV2NAA NEGATIVE 12/29/2021   STAPHAUREUS POSITIVE (A) 12/22/2021   MRSAPCR  NEGATIVE 12/22/2021     Bone Lab Results  Component Value Date   VD25OH 20.6 (L) 12/11/2022     Endocrine Lab Results  Component Value Date   GLUCOSE 110 (H) 12/11/2022   GLUCOSEU NEGATIVE 01/21/2022   TSH 1.500 12/11/2022   FREET4 1.01 12/11/2022     Neuropathy Lab Results  Component Value Date   VITAMINB12 294 12/11/2022   FOLATE 15.2 02/10/2018     CNS No results found for: "COLORCSF", "APPEARCSF", "RBCCOUNTCSF", "WBCCSF", "POLYSCSF", "LYMPHSCSF", "EOSCSF", "PROTEINCSF", "GLUCCSF", "JCVIRUS", "CSFOLI", "IGGCSF", "LABACHR", "ACETBL"   Inflammation (CRP: Acute  ESR: Chronic) Lab Results  Component Value Date   CRP <1 01/11/2023   ESRSEDRATE 2 01/11/2023   LATICACIDVEN 0.6 07/02/2016     Rheumatology No results found for: "RF", "ANA", "LABURIC", "URICUR", "LYMEIGGIGMAB", "LYMEABIGMQN", "HLAB27"   Coagulation Lab Results  Component Value Date   INR 1.07 07/02/2016   LABPROT 13.9 07/02/2016   APTT 36 07/02/2016   PLT 311 12/11/2022     Cardiovascular Lab Results  Component Value Date   TROPONINI <0.03 07/15/2016   HGB 14.4 12/11/2022   HCT 43.8 12/11/2022     Screening Lab Results  Component Value Date   SARSCOV2NAA NEGATIVE 12/29/2021   STAPHAUREUS POSITIVE (A) 12/22/2021   MRSAPCR NEGATIVE 12/22/2021     Cancer No results found for: "CEA", "CA125", "LABCA2"   Allergens No results found for: "ALMOND", "APPLE", "ASPARAGUS", "AVOCADO", "BANANA", "BARLEY", "BASIL", "BAYLEAF", "GREENBEAN", "LIMABEAN", "WHITEBEAN", "BEEFIGE", "REDBEET", "BLUEBERRY", "BROCCOLI", "CABBAGE", "MELON", "CARROT", "CASEIN", "CASHEWNUT", "CAULIFLOWER", "CELERY"     Note: Lab results reviewed.  PFSH  Drug: Evelyn Bentley  reports no history of drug use. Alcohol:  reports that she does not currently use alcohol. Tobacco:  reports that she quit smoking about 8 years ago. Her smoking use included cigarettes. She started smoking about 48 years ago. She has never used smokeless  tobacco. Medical:  has a past medical history of Anxiety, Arthritis, Bursitis, Closed fracture of patella (05/06/2017), Complication of anesthesia, Depression, Family history of adverse reaction to anesthesia, Fibromyalgia, GERD (gastroesophageal reflux disease), H/O arthritis (03/06/2015), H/O: hypothyroidism (03/06/2015), History of 2019 novel coronavirus disease (COVID-19) (12/20/2020), History of kidney stones, History of migraine headaches (05/02/2015), Hypercholesteremia, Hypertension, Hypothyroidism, Knee pain (05/06/2017), PONV (postoperative nausea and vomiting), Pre-diabetes, Sepsis due to urinary tract infection (07/02/2016), Sleep apnea, Status post total hip replacement, 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 Family: family history includes Anxiety disorder in her mother; Depression in her mother.  Past Surgical History:  Procedure Laterality Date   ABDOMINAL HYSTERECTOMY     ANTERIOR INTEROSSEOUS NERVE DECOMPRESSION Left 01/05/2021   Procedure: Cubital tunnel release, left;  Surgeon: Kennedy Bucker, MD;  Location: [facility] ORS;  Service: Orthopedics;  Laterality: Left;   CARPAL TUNNEL RELEASE Right 09/01/2020   Procedure: Right carpal tunnel release;  Surgeon: Kennedy Bucker, MD;  Location: [facility];  Service: Orthopedics;  Laterality: Right;   CARPAL TUNNEL RELEASE Left 01/05/2021   Procedure: Carpal tunnel release, left;  Surgeon: Kennedy Bucker, MD;  Location: [facility];  Service: Orthopedics;  Laterality: Left;   CATARACT EXTRACTION, BILATERAL     CHOLECYSTECTOMY     COLONOSCOPY WITH PROPOFOL N/A 11/13/2016   Procedure: COLONOSCOPY WITH PROPOFOL;  Surgeon: Christena Deem, MD;  Location: [facility] ENDOSCOPY;  Service: Endoscopy;  Laterality: N/A;   ESOPHAGOGASTRODUODENOSCOPY (EGD) WITH PROPOFOL N/A 11/13/2016   Procedure: ESOPHAGOGASTRODUODENOSCOPY (EGD) WITH PROPOFOL;  Surgeon: Christena Deem, MD;  Location: [facility] ENDOSCOPY;  Service: Endoscopy;   Laterality: N/A;   ESOPHAGOGASTRODUODENOSCOPY (EGD) WITH PROPOFOL N/A 12/16/2018   Procedure: ESOPHAGOGASTRODUODENOSCOPY (EGD) WITH PROPOFOL;  Surgeon: Christena Deem, MD;  Location: [facility] ENDOSCOPY;  Service: Endoscopy;  Laterality: N/A;   fundoflication  2015   [facility]   LAPAROSCOPIC HYSTERECTOMY Bilateral 02/28/2016   Procedure: HYSTERECTOMY TOTAL LAPAROSCOPIC / BSO;  Surgeon: Elenora Fender Ward, MD;  Location: [facility];  Service: Gynecology;  Laterality: Bilateral;   NECK SURGERY     X 2   NISSEN FUNDOPLICATION     TOTAL HIP ARTHROPLASTY Right 01/02/2022   Procedure: TOTAL HIP ARTHROPLASTY ANTERIOR APPROACH;  Surgeon: Kennedy Bucker, MD;  Location: [facility];  Service: Orthopedics;  Laterality: Right;   TUBAL LIGATION     URETEROSCOPY WITH HOLMIUM LASER LITHOTRIPSY Left 07/03/2016   Procedure: URETEROSCOPY WITH HOLMIUM LASER LITHOTRIPSY;  Surgeon: Orson Ape, MD;  Location: [facility];  Service: Urology;  Laterality: Left;   Active Ambulatory Problems    Diagnosis Date Noted   Fibromyalgia 03/06/2015   MDD (major depressive disorder), recurrent episode, moderate 04/18/2015   Ureterolithiasis 07/02/2016   Gastroesophageal reflux disease 10/30/2016   Bursitis of hip 05/06/2017   Eustachian tube  dysfunction 05/06/2017   Bilateral sensorineural hearing loss 05/06/2017   Panic attack 07/01/2019   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   Pain due to onychomycosis of toenails of both feet 08/13/2019   Corns and callosities 08/13/2019   Bilateral carpal tunnel syndrome 12/24/2018   Neuropathy 07/21/2019   Cognitive disorder 08/21/2019   GAD (generalized anxiety disorder) 04/18/2015   Numbness and tingling 12/17/2018   Hypothyroidism 11/23/2010   Benign essential hypertension 01/08/2011   Lumbar degenerative disc disease 04/05/2021   Primary osteoarthritis of both hands 12/06/2021   Chronic fatigue 02/11/2023   Chronic pain syndrome 02/11/2023   Pharmacologic therapy  03/03/2023   Disorder of skeletal system 03/03/2023   Problems influencing health status 03/03/2023   Resolved Ambulatory Problems    Diagnosis Date Noted   H/O: hypothyroidism 03/06/2015   H/O arthritis 03/06/2015   History of migraine headaches 05/02/2015   Sepsis due to urinary tract infection 07/02/2016   Closed fracture of patella 05/06/2017   Knee pain 05/06/2017   Sprain of hip 05/06/2017   Weakness of limb 05/06/2017   Status post total hip replacement, 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   Past Medical History:  Diagnosis Date   Anxiety    Arthritis    Bursitis    Complication of anesthesia    Depression    Family history of adverse reaction to anesthesia    GERD (gastroesophageal reflux disease)    History of 2019 novel coronavirus disease (COVID-19) 12/20/2020   History of kidney stones    Hypercholesteremia    Hypertension    PONV (postoperative nausea and vomiting)    Pre-diabetes    Sleep apnea    Thyroid disease    Constitutional Exam  General appearance: Well nourished, well developed, and well hydrated. In no apparent acute distress There were no vitals filed for this visit. BMI Assessment: Estimated body mass index is 32.54 kg/m as calculated from the following:   Height as of 02/11/23: 5' (1.524 m).   Weight as of 02/11/23: 166 lb 9.6 oz (75.6 kg).  BMI interpretation table: BMI level Category Range association with higher incidence of chronic pain  <18 kg/m2 Underweight   18.5-24.9 kg/m2 Ideal body weight   25-29.9 kg/m2 Overweight Increased incidence by 20%  30-34.9 kg/m2 Obese (Class I) Increased incidence by 68%  35-39.9 kg/m2 Severe obesity (Class II) Increased incidence by 136%  >40 kg/m2 Extreme obesity (Class III) Increased incidence by 254%   Patient's current BMI Ideal Body weight  There is no height or weight on file to calculate BMI. Patient weight not recorded   BMI Readings from Last 4 Encounters:  02/11/23 32.54 kg/m  01/11/23 32.85 kg/m  01/21/22  28.71 kg/m  12/22/21 29.12 kg/m   Wt Readings from Last 4 Encounters:  02/11/23 166 lb 9.6 oz (75.6 kg)  01/11/23 168 lb 3.2 oz (76.3 kg)  01/21/22 147 lb (66.7 kg)  12/22/21 149 lb 1.6 oz (67.6 kg)    Psych/Mental status: Alert, oriented x 3 (person, place, & time)       Eyes: PERLA Respiratory: No evidence of acute respiratory distress  Assessment  Primary Diagnosis & Pertinent Problem List: The primary encounter diagnosis was Chronic pain syndrome. Diagnoses of Pharmacologic therapy, Disorder of skeletal system, and Problems influencing health status were also pertinent to this visit.  Visit Diagnosis (New problems to examiner): 1. Chronic pain syndrome   2. Pharmacologic therapy   3. Disorder of skeletal system   4. Problems influencing health status  Plan of Care (Initial workup plan)  Note: Evelyn Bentley was reminded that as per protocol, today's visit has been an evaluation only. We have not taken over the patient's controlled substance management.  Problem-specific plan: No problem-specific Assessment & Plan notes found for this encounter.  Lab Orders  No laboratory test(s) ordered today   Imaging Orders  No imaging studies ordered today   Referral Orders  No referral(s) requested today   Procedure Orders    No procedure(s) ordered today   Pharmacotherapy (current): Medications ordered:  No orders of the defined types were placed in this encounter.  Medications administered during this visit: Evelyn Bentley had no medications administered during this visit.   Analgesic Pharmacotherapy:  Opioid Analgesics: For patients currently taking or requesting to take opioid analgesics, in accordance with [facility] Guidelines, we will assess their risks and indications for the use of these substances. After completing our evaluation, we may offer recommendations, but we no longer take patients for medication management. The prescribing physician  will ultimately decide, based on his/her training and level of comfort whether to adopt any of the recommendations, including whether or not to prescribe such medicines.  Membrane stabilizer: To be determined at a later time  Muscle relaxant: To be determined at a later time  NSAID: To be determined at a later time  Other analgesic(s): To be determined at a later time   Interventional management options: Evelyn Bentley was informed that there is no guarantee that she would be a candidate for interventional therapies. The decision will be based on the results of diagnostic studies, as well as Ms. Ismael's risk profile.  Procedure(s) under consideration:  Pending results of ordered studies      Interventional Therapies  Risk Factors  Considerations:     Planned  Pending:      Under consideration:   Pending   Completed:   None at this time   Therapeutic  Palliative (PRN) options:   None established   Completed by other providers:   None reported       Provider-requested follow-up: No follow-ups on file.  Future Appointments  Date Time Provider Department Center  03/04/2023  9:00 AM Delano Metz, MD [facility] None  03/25/2023  9:15 AM Miki Kins, FNP [facility] None    Duration of encounter: *** minutes.  Total time on encounter, as per AMA guidelines included both the face-to-face and non-face-to-face time personally spent by the physician and/or other qualified health care professional(s) on the day of the encounter (includes time in activities that require the physician or other qualified health care professional and does not include time in activities normally performed by clinical staff). Physician's time may include the following activities when performed: Preparing to see the patient (e.g., pre-charting review of records, searching for previously ordered imaging, lab work, and nerve conduction tests) Review of prior analgesic pharmacotherapies. Reviewing  PMP Interpreting ordered tests (e.g., lab work, imaging, nerve conduction tests) Performing post-procedure evaluations, including interpretation of diagnostic procedures Obtaining and/or reviewing separately obtained history Performing a medically appropriate examination and/or evaluation Counseling and educating the patient/family/caregiver Ordering medications, tests, or procedures Referring and communicating with other health care professionals (when not separately reported) Documenting clinical information in the electronic or other health record Independently interpreting results (not separately reported) and communicating results to the patient/ family/caregiver Care coordination (not separately reported)  Note by: Oswaldo Done, MD (TTS technology used. I apologize for any typographical errors that were not  detected and corrected.) Date: 03/04/2023; Time: 1:10 PM

## 2023-03-04 ENCOUNTER — Ambulatory Visit
Admission: RE | Admit: 2023-03-04 | Discharge: 2023-03-04 | Disposition: A | Discharge status: Home / Self Care | Payer: BC Managed Care – PPO | Source: Ambulatory Visit | Attending: Pain Medicine | Admitting: Pain Medicine

## 2023-03-04 ENCOUNTER — Encounter: Payer: Self-pay | Admitting: Pain Medicine

## 2023-03-04 ENCOUNTER — Ambulatory Visit (HOSPITAL_BASED_OUTPATIENT_CLINIC_OR_DEPARTMENT_OTHER): Payer: BC Managed Care – PPO | Admitting: Pain Medicine

## 2023-03-04 ENCOUNTER — Other Ambulatory Visit: Payer: Self-pay | Admitting: Pain Medicine

## 2023-03-04 VITALS — BP 144/88 | HR 79 | Temp 97.1°F | Resp 16 | Ht 60.0 in | Wt 166.0 lb

## 2023-03-04 DIAGNOSIS — R94130 Abnormal response to nerve stimulation, unspecified: Secondary | ICD-10-CM | POA: Insufficient documentation

## 2023-03-04 DIAGNOSIS — M47812 Spondylosis without myelopathy or radiculopathy, cervical region: Secondary | ICD-10-CM | POA: Insufficient documentation

## 2023-03-04 DIAGNOSIS — Z789 Other specified health status: Secondary | ICD-10-CM

## 2023-03-04 DIAGNOSIS — M79671 Pain in right foot: Secondary | ICD-10-CM | POA: Insufficient documentation

## 2023-03-04 DIAGNOSIS — R937 Abnormal findings on diagnostic imaging of other parts of musculoskeletal system: Secondary | ICD-10-CM | POA: Insufficient documentation

## 2023-03-04 DIAGNOSIS — M797 Fibromyalgia: Secondary | ICD-10-CM | POA: Insufficient documentation

## 2023-03-04 DIAGNOSIS — M542 Cervicalgia: Secondary | ICD-10-CM | POA: Insufficient documentation

## 2023-03-04 DIAGNOSIS — Z981 Arthrodesis status: Secondary | ICD-10-CM | POA: Insufficient documentation

## 2023-03-04 DIAGNOSIS — M25562 Pain in left knee: Secondary | ICD-10-CM | POA: Insufficient documentation

## 2023-03-04 DIAGNOSIS — M48061 Spinal stenosis, lumbar region without neurogenic claudication: Secondary | ICD-10-CM

## 2023-03-04 DIAGNOSIS — M4316 Spondylolisthesis, lumbar region: Secondary | ICD-10-CM | POA: Insufficient documentation

## 2023-03-04 DIAGNOSIS — M25552 Pain in left hip: Secondary | ICD-10-CM | POA: Insufficient documentation

## 2023-03-04 DIAGNOSIS — M25511 Pain in right shoulder: Secondary | ICD-10-CM | POA: Insufficient documentation

## 2023-03-04 DIAGNOSIS — Z9889 Other specified postprocedural states: Secondary | ICD-10-CM | POA: Insufficient documentation

## 2023-03-04 DIAGNOSIS — Z79899 Other long term (current) drug therapy: Secondary | ICD-10-CM

## 2023-03-04 DIAGNOSIS — G600 Hereditary motor and sensory neuropathy: Secondary | ICD-10-CM | POA: Insufficient documentation

## 2023-03-04 DIAGNOSIS — M25512 Pain in left shoulder: Secondary | ICD-10-CM

## 2023-03-04 DIAGNOSIS — M4802 Spinal stenosis, cervical region: Secondary | ICD-10-CM | POA: Insufficient documentation

## 2023-03-04 DIAGNOSIS — M25551 Pain in right hip: Secondary | ICD-10-CM | POA: Insufficient documentation

## 2023-03-04 DIAGNOSIS — M79641 Pain in right hand: Secondary | ICD-10-CM

## 2023-03-04 DIAGNOSIS — M25561 Pain in right knee: Secondary | ICD-10-CM | POA: Insufficient documentation

## 2023-03-04 DIAGNOSIS — M899 Disorder of bone, unspecified: Secondary | ICD-10-CM

## 2023-03-04 DIAGNOSIS — G5623 Lesion of ulnar nerve, bilateral upper limbs: Secondary | ICD-10-CM | POA: Insufficient documentation

## 2023-03-04 DIAGNOSIS — G894 Chronic pain syndrome: Secondary | ICD-10-CM | POA: Insufficient documentation

## 2023-03-04 DIAGNOSIS — G8929 Other chronic pain: Secondary | ICD-10-CM

## 2023-03-04 DIAGNOSIS — M5030 Other cervical disc degeneration, unspecified cervical region: Secondary | ICD-10-CM | POA: Insufficient documentation

## 2023-03-04 DIAGNOSIS — M79642 Pain in left hand: Secondary | ICD-10-CM | POA: Insufficient documentation

## 2023-03-04 DIAGNOSIS — Z96641 Presence of right artificial hip joint: Secondary | ICD-10-CM

## 2023-03-04 DIAGNOSIS — M47817 Spondylosis without myelopathy or radiculopathy, lumbosacral region: Secondary | ICD-10-CM | POA: Insufficient documentation

## 2023-03-04 DIAGNOSIS — G9589 Other specified diseases of spinal cord: Secondary | ICD-10-CM

## 2023-03-04 DIAGNOSIS — M79672 Pain in left foot: Secondary | ICD-10-CM

## 2023-03-04 DIAGNOSIS — M545 Low back pain, unspecified: Secondary | ICD-10-CM | POA: Insufficient documentation

## 2023-03-04 DIAGNOSIS — M47816 Spondylosis without myelopathy or radiculopathy, lumbar region: Secondary | ICD-10-CM | POA: Insufficient documentation

## 2023-03-04 HISTORY — DX: Cervicalgia: M54.2

## 2023-03-04 HISTORY — DX: Other specified postprocedural states: Z98.890

## 2023-03-04 HISTORY — DX: Arthrodesis status: Z98.1

## 2023-03-04 HISTORY — DX: Abnormal response to nerve stimulation, unspecified: R94.130

## 2023-03-04 HISTORY — DX: Abnormal findings on diagnostic imaging of other parts of musculoskeletal system: R93.7

## 2023-03-04 NOTE — Progress Notes (Signed)
Safety precautions to be maintained throughout the outpatient stay will include: orient to surroundings, keep bed in low position, maintain call bell within reach at all times, provide assistance with transfer out of bed and ambulation.  

## 2023-03-04 NOTE — Patient Instructions (Signed)
____________________________________________________________________________________________  New Patients  Welcome to [facility].   Initial Visit The first or initial visit consists of an evaluation only.   Interventional pain management.  We offer therapies other than opioid controlled substances to manage chronic pain. These include, but are not limited to, diagnostic, therapeutic, and palliative specialized injection therapies (i.e.: Epidural Steroids, Facet Blocks, etc.). We specialize in a variety of nerve blocks as well as radiofrequency treatments. We offer pain implant evaluations and trials, as well as follow up management. In addition we also provide a variety joint injections, including Viscosupplementation (AKA: Gel Therapy).  Prescription Pain Medication. We specialize in alternatives to opioids. We can provide evaluations and recommendations for/of pharmacologic therapies based on CDC Guidelines.  We no longer take patients for long-term medication management. We will not be taking over your pain medications.  ____________________________________________________________________________________________    ____________________________________________________________________________________________  Patient Information update  To: All of our patients.  Re: Name change.  It has been made official that our current name, "[facility]"   will soon be changed to "[facility]".   The purpose of this change is to eliminate any confusion created by the concept of our practice being a "Medication Management Pain Clinic". In the past this has led to the misconception that we treat pain primarily by the use of prescription medications.  Nothing can be farther from the truth.   Understanding PAIN MANAGEMENT: To  further understand what our practice does, you first have to understand that "Pain Management" is a subspecialty that requires additional training once a physician has completed their specialty training, which can be in either Anesthesia, Neurology, Psychiatry, or Physical Medicine and Rehabilitation (PMR). Each one of these contributes to the final approach taken by each physician to the management of their patient's pain. To be a "Pain Management Specialist" you must have first completed one of the specialty trainings below.  Anesthesiologists - trained in clinical pharmacology and interventional techniques such as nerve blockade and regional as well as central neuroanatomy. They are trained to block pain before, during, and after surgical interventions.  Neurologists - trained in the diagnosis and pharmacological treatment of complex neurological conditions, such as Multiple Sclerosis, Parkinson's, spinal cord injuries, and other systemic conditions that may be associated with symptoms that may include but are not limited to pain. They tend to rely primarily on the treatment of chronic pain using prescription medications.  Psychiatrist - trained in conditions affecting the psychosocial wellbeing of patients including but not limited to depression, anxiety, schizophrenia, personality disorders, addiction, and other substance use disorders that may be associated with chronic pain. They tend to rely primarily on the treatment of chronic pain using prescription medications.   Physical Medicine and Rehabilitation (PMR) physicians, also known as physiatrists - trained to treat a wide variety of medical conditions affecting the brain, spinal cord, nerves, bones, joints, ligaments, muscles, and tendons. Their training is primarily aimed at treating patients that have suffered injuries that have caused severe physical impairment. Their training is primarily aimed at the physical therapy and rehabilitation of those  patients. They may also work alongside orthopedic surgeons or neurosurgeons using their expertise in assisting surgical patients to recover after their surgeries.  INTERVENTIONAL PAIN MANAGEMENT is sub-subspecialty of Pain Management.  Our physicians are Board-certified in Anesthesia, Pain Management, and Interventional Pain Management.  This meaning that not only have they been trained   and Board-certified in their specialty of Anesthesia, and subspecialty of Pain Management, but they have also received further training in the sub-subspecialty of Interventional Pain Management, in order to become Board-certified as INTERVENTIONAL PAIN MANAGEMENT SPECIALIST.    Mission: Our goal is to use our skills in  INTERVENTIONAL PAIN MANAGEMENT as alternatives to the chronic use of prescription opioid medications for the treatment of pain. To make this more clear, we have changed our name to reflect what we do and offer. We will continue to offer medication management assessment and recommendations, but we will not be taking over any patient's medication management.  ____________________________________________________________________________________________     

## 2023-03-05 LAB — MAGNESIUM: Magnesium: 2.3 mg/dL (ref 1.6–2.3)

## 2023-03-07 LAB — COMPLIANCE DRUG ANALYSIS, UR

## 2023-03-15 IMAGING — MR MR HIP*R* W/O CM
4 of 5 series · 26 of 40 positions shown · non-contrast
Comparison: None.

CLINICAL DATA: Right groin pain

EXAM:
MR OF THE RIGHT HIP WITHOUT CONTRAST
TECHNIQUE: Multiplanar, multisequence MR imaging was performed. No intravenous
contrast was administered.

[Series 2: T1 · coronal · right · 4.0mm · 0.59mm/px · 4 of 25 slices shown]
[im 1/25]
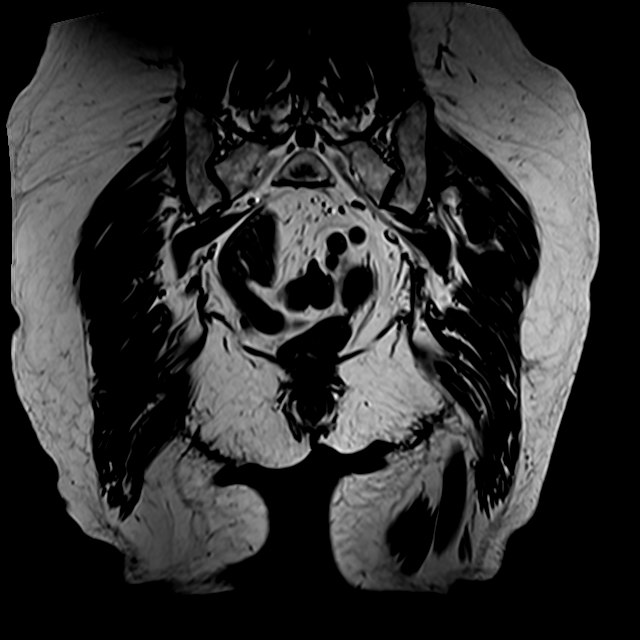
[im 4/25]
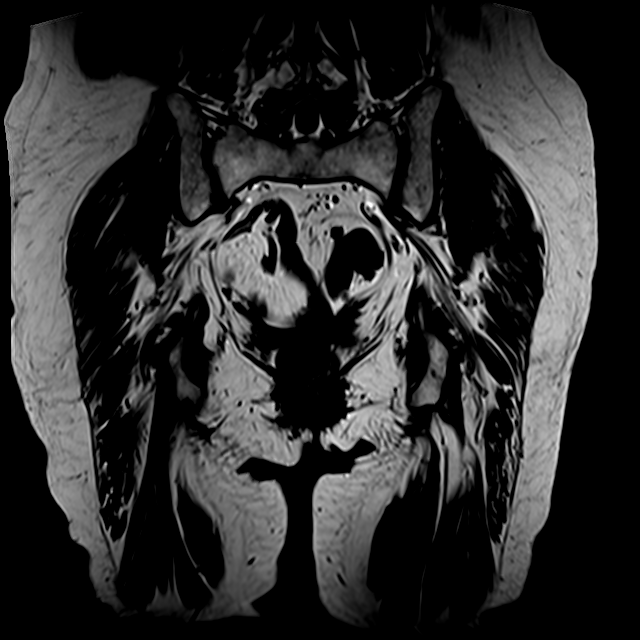
[im 13/25]
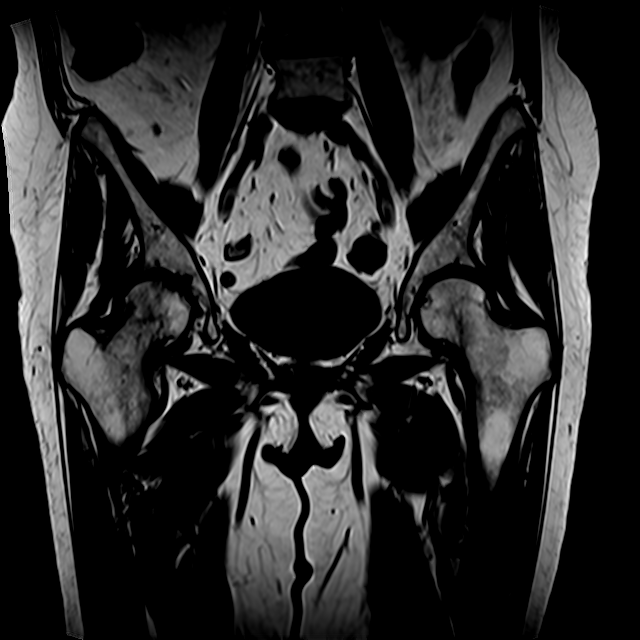
[im 22/25]
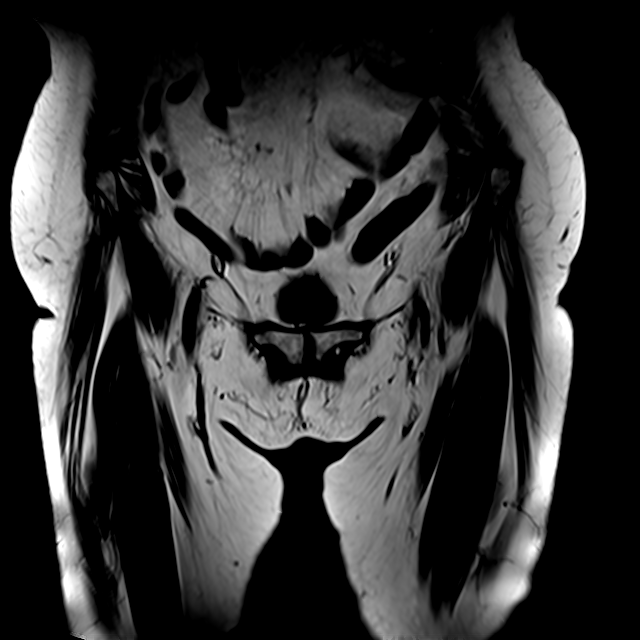

[Series 4: T2 fat-sat · axial · right · 4.0mm · 0.70mm/px · z∈[-38,+67]mm · 8 of 22 slices shown]
[im 1/22]
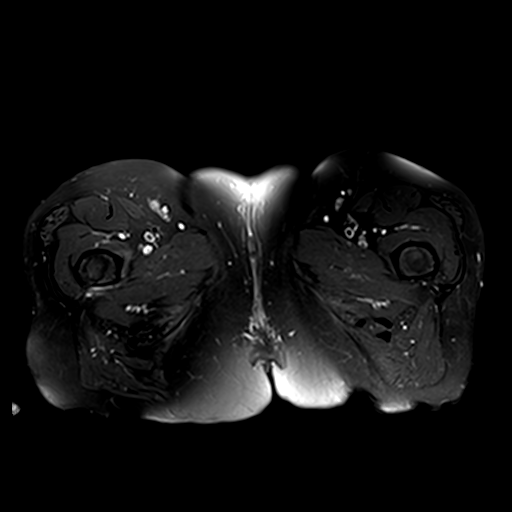
[im 4/22]
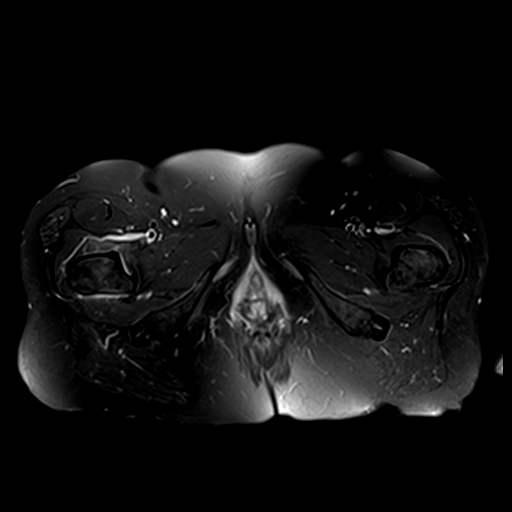
[im 7/22]
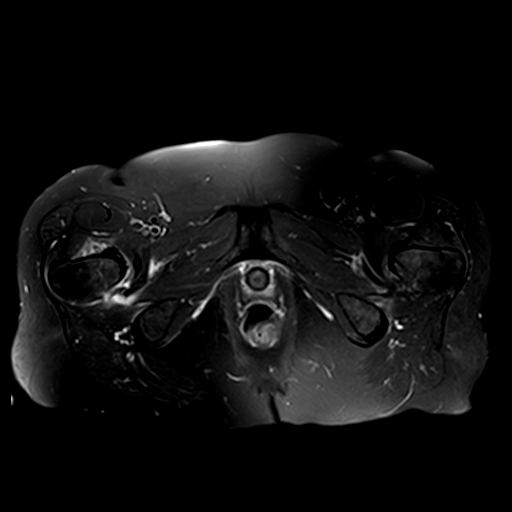
[im 10/22]
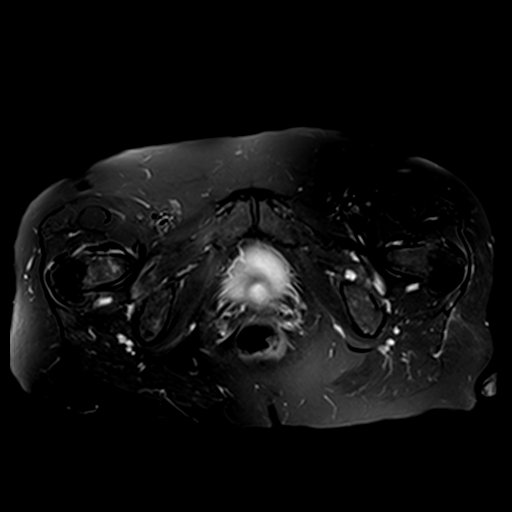
[im 13/22]
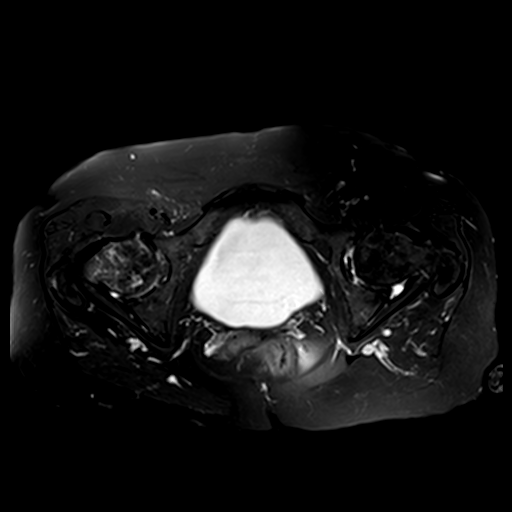
[im 16/22]
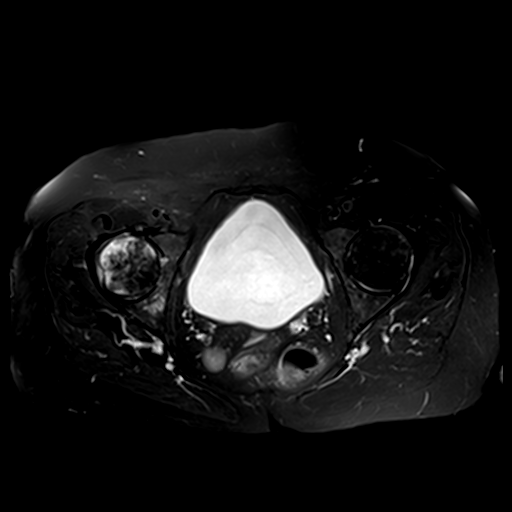
[im 19/22]
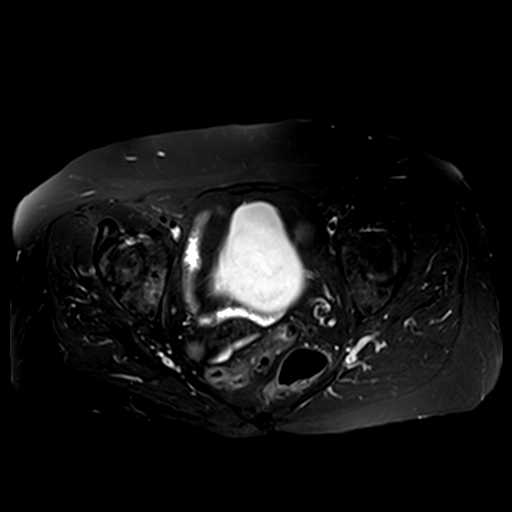
[im 22/22]
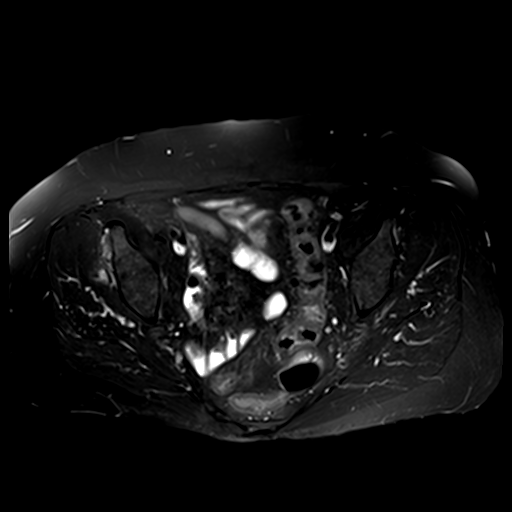

[Series 5: PD fat-sat · sagittal · right · 4.0mm · 0.70mm/px · 8 of 22 slices shown (1 of 2)]
[im 1/22]
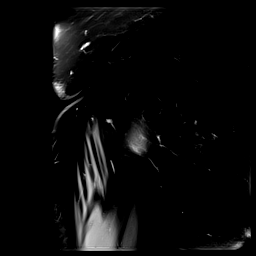
[im 4/22]
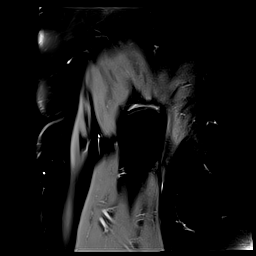
[im 7/22]
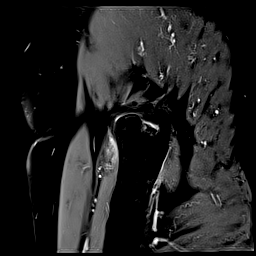
[im 10/22]
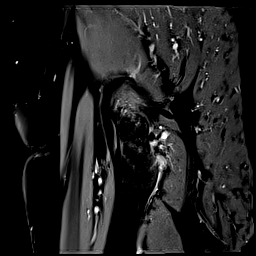
[im 13/22]
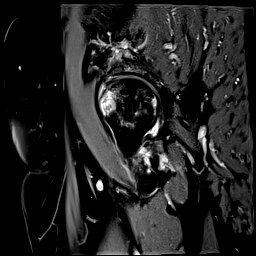
[im 16/22]
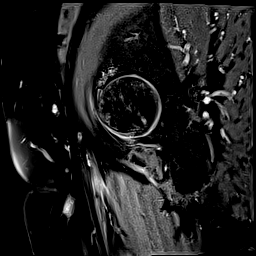
[im 19/22]
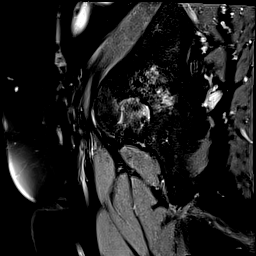
[im 22/22]
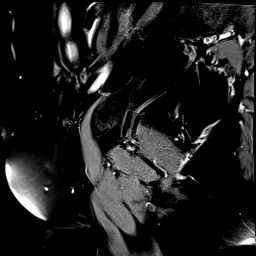

[Series 6: PD fat-sat · coronal · right · 4.0mm · 0.70mm/px · 6 of 15 slices shown (2 of 2)]
[im 1/15]
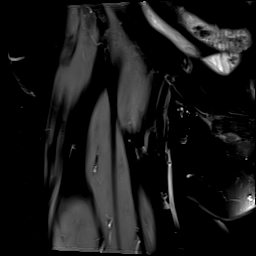
[im 3/15]
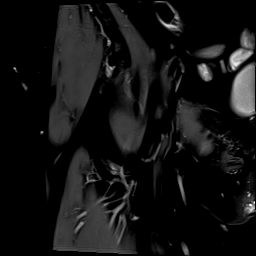
[im 6/15]
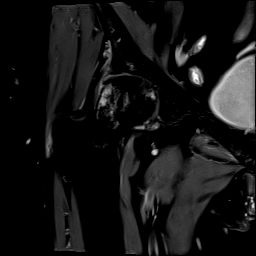
[im 9/15]
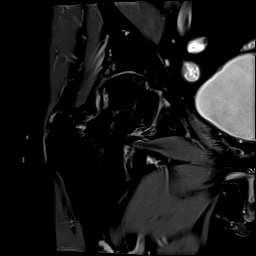
[im 12/15]
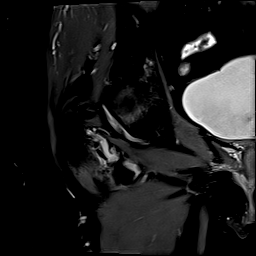
[im 15/15]
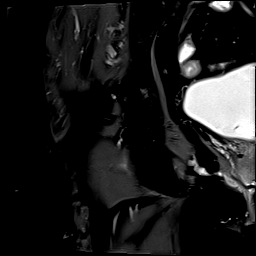

[26 of 40 positions shown; findings below may reference images not displayed]

FINDINGS: Bones: Bone marrow edema within the right femoral head and neck,
most pronounced laterally. Mild subchondral marrow edema within the
right acetabulum. No femoral head avascular necrosis. Bony pelvis
intact. SI joints and pubic symphysis within normal limits. No
additional sites of bone marrow edema. No suspicious bone lesion.

Articular cartilage and labrum

Articular cartilage: Moderate diffuse chondral thinning of the right
hip joint.

Labrum: Superior labrum appears degenerated. Evaluation is limited
in the absence of intra-articular fluid or contrast.

Joint or bursal effusion

Joint effusion:  None.

Bursae: No bursal fluid collections.

Muscles and tendons

Muscles and tendons: The gluteal, hamstring, iliopsoas, rectus
femoris, and adductor tendons appear intact without tear or
significant tendinosis. Normal muscle bulk and signal intensity
without edema, atrophy, or fatty infiltration.

Other findings

Miscellaneous: No fluid collections. No inguinal lymphadenopathy.
Sigmoid diverticulosis. No acute findings are evident within the
pelvis.
IMPRESSION: 1. Moderate right hip osteoarthritis. Bone marrow edema within the
right femoral head and neck, most pronounced laterally. Findings may
be reactive secondary to osteoarthritis or represent stress-related
changes.
2. Sigmoid diverticulosis.

## 2023-03-24 NOTE — Progress Notes (Unsigned)
PROVIDER NOTE: Information contained herein reflects review and annotations entered in association with encounter. Interpretation of such information and data should be left to medically-trained personnel. Information provided to patient can be located elsewhere in the medical record under "Patient Instructions". Document created using STT-dictation technology, any transcriptional errors that may result from process are unintentional.    Patient: Evelyn Bentley  Service Category: E/M  Provider: Oswaldo Done, MD  DOB: 1956-10-30  DOS: 03/25/2023  Referring Provider: Miki Kins, FNP  MRN: [id number]  Specialty: Interventional Pain Management  PCP: Miki Kins, FNP  Type: Established Patient  Setting: Ambulatory outpatient    Location: Office  Delivery: Face-to-face     Primary Reason(s) for Visit: Encounter for evaluation before starting new chronic pain management plan of care (Level of risk: moderate) CC: No chief complaint on file.  HPI  Evelyn Bentley is a 67 y.o. year old, female patient, who comes today for a follow-up evaluation to review the test results and decide on a treatment plan. She has Fibromyalgia; MDD (major depressive disorder), recurrent episode, moderate (HCC); Ureterolithiasis; Gastroesophageal reflux disease; Bursitis of hip; Eustachian tube dysfunction; Chronic knee pain (5th area of Pain) (Bilateral) (R>L); Bilateral sensorineural hearing loss; Panic attack; Insomnia due to mental condition; Pain due to onychomycosis of toenails of both feet; Corns and callosities; Carpal tunnel syndrome (Bilateral); Neuropathy; Cognitive disorder; GAD (generalized anxiety disorder); Numbness and tingling; Hypothyroidism; Benign essential hypertension; DDD (degenerative disc disease), lumbar; History of THR (total hip arthroplasty) (Right); Primary osteoarthritis of both hands; Chronic fatigue; Chronic pain syndrome; Pharmacologic therapy; Disorder of skeletal system; Problems  influencing health status; Abnormal MRI, lumbar spine (12/11/2022); Chronic low back pain (1ry area of Pain) (Bilateral) (R>L) w/o sciatica; Chronic hip pain (Bilateral) (R>L); Chronic hip pain after total replacement of hip joint (Right); Chronic neck pain (posterior) (3ry area of Pain) (Bilateral) (R>L); Cervicalgia; Chronic shoulder pain (4th area of Pain) (Bilateral) (R>L); Chronic hand pain (6th area of Pain) (Bilateral) (R=L); History of carpal tunnel release of wrists (Bilateral); Chronic feet pain (7th area of Pain) (Bilateral); Charcot-Marie-Tooth disease (feet); Abnormal NCS (nerve conduction studies); Cubital tunnel syndrome (Bilateral); History of fusion of cervical spine (C4-C7); Cervical cord myelomalacia of (C5); Cervical spinal stenosis (C3-4); Cervical facet arthropathy; Cervical facet joint pain; 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 Her primarily concern today is the No chief complaint on file.  Pain Assessment: Location:     Radiating:   Onset:   Duration:   Quality:   Severity:  /10 (subjective, self-reported pain score)  Effect on ADL:   Timing:   Modifying factors:   BP:    HR:    Evelyn Bentley comes in today for a follow-up visit after her initial evaluation on 03/04/2023. Today we went over the results of her tests. These were explained in "Layman's terms". During today's appointment we went over my diagnostic impression, as well as the proposed treatment plan.  ***According to the patient the primary area of pain is that of the lower back (Bilateral) (R>L).  She denies any lumbar spine surgery but does admit to having had physical therapy more than 2 years ago at [facility], twice per week x 1 month (did not help).  In addition the patient indicates having had x-rays of the lumbar spine in the past and having  had 2-3 nerve blocks done by  Dr. Yves Dill approximately 3 years ago, none of which helped.   The patient's secondary area pain is that of the hips (Bilateral) (R>L).  She does admit having had a right total hip replacement x1 approximately 1 year ago.  She continues to have constant pain in that hip.  The surgery was done by Dr. Rosita Kea [facility]).  The patient denies having had any kind of problems after the surgery, but she also indicates that she was unable to get postoperative physical therapy due to the fact that there was no physical therapist available during that time.  She denies any other postoperative complications, she indicates having had some x-rays done at the [facility] and she also indicated having had multiple trochanteric bursa injections done by Vear Clock, PA-C and Burman Freestone, NP.  In the case of the left hip, the pain is intermittent, no history of any prior surgery, physical therapy, joint injections, but she thinks she might have had an x-ray.   The patient's third area pain is that of the neck (posterior) (Bilateral) (R>L).  She describes having had 2 prior neck surgeries approximately 12 to 15 years ago.  No history of nerve blocks, recent x-rays, but she refers that approximately 10 years ago she had physical therapy which helped with the mobility, but not with the pain.  She has Ramos having had the physical therapy before and after the cervical spine surgeries with the first 1 having been done in [place] on the second 1 at [place].  She denies any occipital headaches but does admit to sinus headaches.  She does admit to having pain referred to the shoulders.   The patient's fourth area pain is that of the shoulders (Bilateral) (R>L).  She denies any shoulder surgery, physical therapy, x-rays, joint injections, or nerve blocks.     The patient's fifth area pain is that of the knees (Bilateral) (R>L).  No prior history of knee surgeries, physical therapy,  recent x-rays, joint injections, or nerve blocks.   The patient's sixth area pain is that of the hands (Bilateral) (R>L).  She is right-handed.  She indicates having had bilateral carpal tunnel syndrome for which she had bilateral carpal tunnel surgery.  She denies any recent x-rays, physical therapy, or nerve blocks.   The patient's seventh area pain is that of her feet secondary to Charcot-Marie-Tooth disease affecting her feet.   Her eighth and final is her fibromyalgia.    The patient also describes having had several nerve conduction test of the upper and lower extremity, all of which she describes as having being abnormal.  Patient presented with interventional treatment options. Ms. Olive was informed that I will not be providing medication management. Pharmacotherapy evaluation including recommendations may be offered, if specifically requested.   Controlled Substance Pharmacotherapy Assessment REMS (Risk Evaluation and Mitigation Strategy)  Opioid Analgesic: None MME/day: 0 mg/day  Pill Count: None expected due to no prior prescriptions written by our practice. No notes on file Pharmacokinetics: Liberation and absorption (onset of action): WNL Distribution (time to peak effect): WNL Metabolism and excretion (duration of action): WNL         Pharmacodynamics: Desired effects: Analgesia: Ms. Dedmon reports >50% benefit. Functional ability: Patient reports that medication allows her to accomplish basic ADLs Clinically meaningful improvement in function (CMIF): Sustained CMIF goals met Perceived effectiveness: Described as relatively effective, allowing for increase in activities of daily living (ADL) Undesirable effects: Side-effects or Adverse reactions: None reported Monitoring: [place] PMP: PDMP reviewed during  this encounter. Online review of the past 71-month period previously conducted. Not applicable at this point since we have not taken over the patient's medication  management yet. List of other Serum/Urine Drug Screening Test(s):  No results found for: "AMPHSCRSER", "BARBSCRSER", "BENZOSCRSER", "COCAINSCRSER", "COCAINSCRNUR", "PCPSCRSER", "THCSCRSER", "THCU", "CANNABQUANT", "OPIATESCRSER", "OXYSCRSER", "PROPOXSCRSER", "ETH", "CBDTHCR", "D8THCCBX", "D9THCCBX" List of all UDS test(s) done:  Lab Results  Component Value Date   SUMMARY Note 03/04/2023   Last UDS on record: Summary  Date Value Ref Range Status  03/04/2023 Note  Final    Comment:    ==================================================================== Compliance Drug Analysis, Ur ==================================================================== Test                             Result       Flag       Units  Drug Present and Declared for Prescription Verification   Acetaminophen                  PRESENT      EXPECTED  Drug Present not Declared for Prescription Verification   Carboxy-THC                    516          UNEXPECTED ng/mg creat    Carboxy-THC is a metabolite of tetrahydrocannabinol (THC). Source of    THC is most commonly herbal marijuana or marijuana-based products,    but THC is also present in a scheduled prescription medication.    Trace amounts of THC can be present in hemp and cannabidiol (CBD)    products. This test is not intended to distinguish between delta-9-    tetrahydrocannabinol, the predominant form of THC in most herbal or    marijuana-based products, and delta-8-tetrahydrocannabinol.    Dextrorphan/Levorphanol        PRESENT      UNEXPECTED    Dextrorphan is an expected metabolite of dextromethorphan, an over-    the-counter or prescription cough suppressant. Levorphanol is a    scheduled prescription medication. Dextrorphan cannot be    distinguished from levorphanol by the method used for analysis.  Drug Absent but Declared for Prescription Verification   Chlorpheniramine               Not Detected  UNEXPECTED ==================================================================== Test                      Result    Flag   Units      Ref Range   Creatinine              165              mg/dL      >=45 ==================================================================== Declared Medications:  The flagging and interpretation on this report are based on the  following declared medications.  Unexpected results may arise from  inaccuracies in the declared medications.   **Note: The testing scope of this panel includes these medications:   Chlorpheniramine   **Note: The testing scope of this panel does not include small to  moderate amounts of these reported medications:   Acetaminophen (Tylenol)   **Note: The testing scope of this panel does not include the  following reported medications:   Azelastine (Astelin)  Celecoxib (Celebrex)  Hydrochlorothiazide (Zestoretic)  Levothyroxine (Synthroid)  Lisinopril (Zestoretic)  Polyethylene Glycol (MiraLAX)  Rosuvastatin (Crestor)  Vitamin B12  Vitamin D2 (Drisdol) ==================================================================== For clinical consultation,  please call [phone]. ====================================================================    UDS interpretation: No unexpected findings.          Medication Assessment Form: Not applicable. No opioids. Treatment compliance: Not applicable Risk Assessment Profile: Aberrant behavior: See initial evaluations. None observed or detected today Comorbid factors increasing risk of overdose: See initial evaluation. No additional risks detected today Opioid risk tool (ORT):     03/04/2023    9:08 AM  Opioid Risk   Alcohol 0  Illegal Drugs 0  Rx Drugs 0  Alcohol 0  Illegal Drugs 0  Rx Drugs 0  Psychological Disease 0  Depression 0  Opioid Risk Tool Scoring 0  Opioid Risk Interpretation Low Risk    ORT Scoring interpretation table:  Score <3 = Low Risk for SUD  Score  between 4-7 = Moderate Risk for SUD  Score >8 = High Risk for Opioid Abuse   Risk of substance use disorder (SUD): Low  Risk Mitigation Strategies:  Patient opioid safety counseling: No controlled substances prescribed. Patient-Prescriber Agreement (PPA): No agreement signed.  Controlled substance notification to other providers: None required. No opioid therapy.  Pharmacologic Plan: Non-opioid analgesic therapy offered. Interventional alternatives discussed.             Laboratory Chemistry Profile   Renal Lab Results  Component Value Date   BUN 11 12/11/2022   CREATININE 0.74 12/11/2022   BCR 15 12/11/2022   GFRAA >60 07/14/2016   GFRNONAA >60 01/21/2022   PROTEINUR NEGATIVE 01/21/2022     Electrolytes Lab Results  Component Value Date   NA 141 12/11/2022   K 4.3 12/11/2022   CL 105 12/11/2022   CALCIUM 9.5 12/11/2022   MG 2.3 03/04/2023     Hepatic Lab Results  Component Value Date   AST 21 12/11/2022   ALT 17 12/11/2022   ALBUMIN 4.5 12/11/2022   ALKPHOS 89 12/11/2022     ID Lab Results  Component Value Date   SARSCOV2NAA NEGATIVE 12/29/2021   STAPHAUREUS POSITIVE (A) 12/22/2021   MRSAPCR NEGATIVE 12/22/2021     Bone Lab Results  Component Value Date   VD25OH 20.6 (L) 12/11/2022     Endocrine Lab Results  Component Value Date   GLUCOSE 110 (H) 12/11/2022   GLUCOSEU NEGATIVE 01/21/2022   TSH 1.500 12/11/2022   FREET4 1.01 12/11/2022     Neuropathy Lab Results  Component Value Date   VITAMINB12 294 12/11/2022   FOLATE 15.2 02/10/2018     CNS No results found for: "COLORCSF", "APPEARCSF", "RBCCOUNTCSF", "WBCCSF", "POLYSCSF", "LYMPHSCSF", "EOSCSF", "PROTEINCSF", "GLUCCSF", "JCVIRUS", "CSFOLI", "IGGCSF", "LABACHR", "ACETBL"   Inflammation (CRP: Acute  ESR: Chronic) Lab Results  Component Value Date   CRP <1 01/11/2023   ESRSEDRATE 2 01/11/2023   LATICACIDVEN 0.6 07/02/2016     Rheumatology No results found for: "RF", "ANA", "LABURIC",  "URICUR", "LYMEIGGIGMAB", "LYMEABIGMQN", "HLAB27"   Coagulation Lab Results  Component Value Date   INR 1.07 07/02/2016   LABPROT 13.9 07/02/2016   APTT 36 07/02/2016   PLT 311 12/11/2022     Cardiovascular Lab Results  Component Value Date   TROPONINI <0.03 07/15/2016   HGB 14.4 12/11/2022   HCT 43.8 12/11/2022     Screening Lab Results  Component Value Date   SARSCOV2NAA NEGATIVE 12/29/2021   STAPHAUREUS POSITIVE (A) 12/22/2021   MRSAPCR NEGATIVE 12/22/2021     Cancer No results found for: "CEA", "CA125", "LABCA2"   Allergens No results found for: "ALMOND", "APPLE", "ASPARAGUS", "AVOCADO", "BANANA", "BARLEY", "BASIL", "BAYLEAF", "GREENBEAN", "  LIMABEAN", "WHITEBEAN", "BEEFIGE", "REDBEET", "BLUEBERRY", "BROCCOLI", "CABBAGE", "MELON", "CARROT", "CASEIN", "CASHEWNUT", "CAULIFLOWER", "CELERY"     Note: Lab results reviewed.  Recent Diagnostic Imaging Review  Cervical Imaging: Cervical MR wo contrast: Results for orders placed during the hospital encounter of 05/10/21 MR CERVICAL SPINE WO CONTRAST  Narrative CLINICAL DATA:  Bilateral hand numbness.  History of cervical fusion  EXAM: MRI CERVICAL SPINE WITHOUT CONTRAST  TECHNIQUE: Multiplanar, multisequence MR imaging of the cervical spine was performed. No intravenous contrast was administered.  COMPARISON:  Cervical MRI 12/30/2018  FINDINGS: Alignment: Normal  Vertebrae: Negative for fracture or mass  Cord: Mild cord hyperintensity at C5, unchanged.  Posterior Fossa, vertebral arteries, paraspinal tissues: Negative  Disc levels:  C2-3: Mild left foraminal narrowing due to facet hypertrophy  C3-4: Small central disc protrusion unchanged. Mild spinal stenosis. Bilateral facet degeneration. No significant foraminal stenosis  C4-5: ACDF with anterior plate. Mild left foraminal narrowing. Spinal canal adequate in size  C5-6: Solid interbody fusion without plate. Negative for spinal or foraminal  stenosis  C6-7: Solid interbody fusion without anterior plate. Negative for spinal or foraminal stenosis  C7-T1: Mild disc and mild facet degeneration.  Negative for stenosis  T1-2: Central disc protrusion without stenosis.  IMPRESSION: Cervical fusion C4 through C7. 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  No change from the prior MRI   Electronically Signed By: Marlan Palau M.D. On: 05/10/2021 14:04  Cervical DG Bending/F/E views: Results for orders placed during the hospital encounter of 03/04/23 DG Cervical Spine With Flex & Extend  Narrative CLINICAL DATA:  Cervicalgia.  EXAM: CERVICAL SPINE COMPLETE WITH FLEXION AND EXTENSION VIEWS  COMPARISON:  Lateral view of the cervical spine 01/04/2009, MRI cervical spine 05/10/2021  FINDINGS: Redemonstration of C4-5 anterior plate and screw fixation with intervertebral disc spacer. Redemonstration of more remote C5-6 and C6-7 fusion. There is complete osseous fusion of the C4 through C7 disc levels.  No sagittal spondylolisthesis. Vertebral body heights are maintained.  There is 2 mm grade 1 anterolisthesis of C2 on C3 on neutral and flexion view that minimally decreases to 1 mm on extension view.  No neuroforaminal stenosis is seen on oblique views.  Mild-to-moderate facet joint arthropathy is greatest at C2-3 and next greatest at C3-4.  No prevertebral soft tissue swelling.  The lung apices are clear.  IMPRESSION: 1. Remote C4-5 anterior plate and screw fixation without evidence of hardware failure. 2. Even more remote C5-6 and C6-7 fusion with complete osseous fusion of the C4 through C7 disc levels. 3. Grade 1 anterolisthesis of C2 on C3 minimally decreases on extension.   Electronically Signed By: Neita Garnet M.D. On: 03/06/2023 09:52  Shoulder Imaging: Shoulder-R DG: Results for orders placed during  the hospital encounter of 03/04/23 DG Shoulder Right  Narrative CLINICAL DATA:  Axial pain with passive radicular component. Right shoulder pain.  EXAM: RIGHT SHOULDER - 2+ VIEW  COMPARISON:  None Available.  FINDINGS: Minimal glenohumeral joint space narrowing. Minimal inferior glenoid degenerative osteophytosis. Minimal acromioclavicular joint space narrowing and mild predominantly superior degenerative osteophytes. No acute fracture or dislocation. The visualized portion of the right lung is unremarkable.  IMPRESSION: Mild glenohumeral and acromioclavicular osteoarthritis.   Electronically Signed By: Neita Garnet M.D. On: 03/06/2023 09:57  Shoulder-L DG: Results for orders placed during the hospital encounter of 03/04/23 DG Shoulder Left  Narrative CLINICAL DATA:  Axial pain with passive radicular component.  Left shoulder pain.  EXAM: LEFT SHOULDER - 2+ VIEW  COMPARISON:  None Available.  FINDINGS: Mild glenohumeral joint space narrowing. Minimal inferior glenoid degenerative osteophytosis. Mild acromioclavicular joint space narrowing with minimal peripheral osteophytes. No acute fracture or dislocation. The visualized portion of the left lung is unremarkable. Moderate atherosclerotic calcifications within the aortic arch. Left upper quadrant abdominal surgical clips.  IMPRESSION: Mild glenohumeral and acromioclavicular osteoarthritis.   Electronically Signed By: Neita Garnet M.D. On: 03/06/2023 09:58  Lumbosacral Imaging: Lumbar MR wo contrast: Results for orders placed during the hospital encounter of 12/10/22 MR LUMBAR SPINE WO CONTRAST  Narrative CLINICAL DATA:  Low back pain extending into the right hip for 6 months. Right leg numbness  EXAM: MRI LUMBAR SPINE WITHOUT CONTRAST  TECHNIQUE: Multiplanar, multisequence MR imaging of the lumbar spine was performed. No intravenous contrast was administered.  COMPARISON:   03/07/2019  FINDINGS: Segmentation:  Standard.  Alignment:  Mild degenerative anterolisthesis at L3-4 and L4-5  Vertebrae:  No fracture, evidence of discitis, or bone lesion.  Conus medullaris and cauda equina: Conus extends to the L1 level. Conus and cauda equina appear normal.  Paraspinal and other soft tissues: Negative for perispinal mass or inflammation.  Disc levels:  T12- L1: Mild progression of chronic central protrusion. No nerve root compression  L1-L2: Small left paracentral protrusion with mild interval regression. Negative facets  L2-L3: Disc bulging eccentric to the left with inferior foraminal protrusion. Mild facet spurring. Mild left foraminal narrowing.  L3-L4: Degenerative facet spurring. Disc space narrowing and bulging. Mild triangular narrowing of the thecal sac.  L4-L5: Degenerative facet spurring with mild anterolisthesis. Mild disc bulging with left foraminal annular fissure. No neural compression.  L5-S1:Degenerative facet spurring.  Negative disc.  IMPRESSION: 1. Generalized lumbar spine degeneration with mild L3-4 and L4-5 anterolisthesis. 2. T12-L1 mild progression of the central disc protrusion compared to 2020, but no neural compression. 3. L2-3 mild left foraminal narrowing   Electronically Signed By: Tiburcio Pea M.D. On: 12/11/2022 05:56  Lumbar DG Bending views: Results for orders placed during the hospital encounter of 03/04/23 DG Lumbar Spine Complete W/Bend  Narrative CLINICAL DATA:  Lower back pain.  EXAM: LUMBAR SPINE - COMPLETE WITH BENDING VIEWS  COMPARISON:  MRI lumbar spine 12/10/2022  FINDINGS: There are 5 non-rib-bearing lumbar-type vertebral bodies. Minimal levocurvature centered at L4-5. There is 2-3 mm grade 1 anterolisthesis of L4 on L5, unchanged on neutral, flexion, and extension view.  1-2 mm retrolisthesis of L1 on L2, unchanged on neutral, flexion, and extension view.  High-grade L4-5 and  moderate L5-S1 facet joint sclerosis and hypertrophy.  Minimal levocurvature centered at L4-5. Mild right L4-5 disc space narrowing.  Cholecystectomy clips. Additional left upper quadrant abdominal surgical clips. Mild high-grade atherosclerotic calcifications.  IMPRESSION: 1. High-grade L4-5 and moderate L5-S1 facet joint degenerative changes. 2. Mild right L4-5 disc space narrowing. 3. Minimal grade 1 anterolisthesis of L4 on L5.   Electronically Signed By: Neita Garnet M.D. On: 03/06/2023 09:56  Hip Imaging: Hip-R MR wo contrast: Results for orders placed during the hospital encounter of 10/22/22 MR HIP RIGHT WO CONTRAST  Narrative CLINICAL DATA:  Right hip pain, hip replacement 1 year ago. Persistent pain which travels down the right thigh. No known injury.  EXAM: MR OF THE RIGHT HIP WITHOUT CONTRAST  TECHNIQUE: Multiplanar, multisequence MR imaging was performed. No intravenous contrast was administered.  COMPARISON:  Radiographs dated January 02, 2022  FINDINGS: Bones: Status post right hip arthroplasty. No evidence  perihardware loosening or edema. No appreciable joint effusion. Sacroiliac joints and pubic symphysis are intact. No edema inflammatory changes.  Muscles and tendons  Muscles and tendons: Muscles are normal in bulk. No edema or inflammatory changes. No evidence of tendon tear.  Other findings  Miscellaneous: No evidence of bursitis. Skin and subcutaneous soft tissues are within normal limits. Colonic diverticulosis.  IMPRESSION: 1. Status post right hip arthroplasty. No evidence of perihardware loosening or edema. No joint effusion. 2. Muscles and tendons are within normal limits. No evidence of tendon tear. 3. Colonic diverticulosis.   Electronically Signed By: Larose Hires D.O. On: 10/22/2022 16:31  Hip-R DG 2-3 views: Results for orders placed during the hospital encounter of 01/02/22 DG HIP UNILAT W OR W/O PELVIS 2-3 VIEWS  RIGHT  Narrative CLINICAL DATA:  Status post right hip replacement.  Postop pain.  EXAM: DG HIP (WITH OR WITHOUT PELVIS) 2-3V RIGHT  COMPARISON:  Intraoperative images earlier today  FINDINGS: AP and lateral views demonstrate right hip arthroplasty with overlying soft tissue gas and surgical staples. No hardware complication or periprosthetic fracture.  IMPRESSION: Expected appearance after right hip arthroplasty.   Electronically Signed By: Jeronimo Greaves M.D. On: 01/02/2022 09:29  Knee Imaging: Knee-R DG 4 views: Results for orders placed during the hospital encounter of 03/04/23 DG Knee Complete 4 Views Right  Narrative CLINICAL DATA:  Axial pain with passive radicular component. Right knee pain/arthralgia.  EXAM: RIGHT KNEE - COMPLETE 4+ VIEW  COMPARISON:  None Available.  FINDINGS: Minimal medial and lateral compartment joint space narrowing. No joint effusion. No acute fracture or dislocation.  IMPRESSION: Minimal medial and lateral compartment joint space narrowing.   Electronically Signed By: Neita Garnet M.D. On: 03/06/2023 10:02  Knee-L DG 4 views: Results for orders placed during the hospital encounter of 03/04/23 DG Knee Complete 4 Views Left  Narrative CLINICAL DATA:  Left knee pain/arthralgia.  EXAM: LEFT KNEE - COMPLETE 4+ VIEW  COMPARISON:  None Available.  FINDINGS: Minimal medial and lateral compartment joint space narrowing. Mild patellofemoral joint space narrowing. Moderate elongation of the inferior patellar pole with peripheral cortical irregularity, likely the sequela of chronic traction changes at the patellar tendon origin. No joint effusion. No acute fracture dislocation.  IMPRESSION: Moderate elongation of the inferior patellar pole, likely the sequela of chronic traction changes at the patellar tendon origin. Mild patellofemoral osteoarthritis.   Electronically Signed By: Neita Garnet M.D. On: 03/06/2023  10:03  Foot Imaging: Foot-R DG Complete: Results for orders placed during the hospital encounter of 03/04/23 DG Foot Complete Right  Narrative CLINICAL DATA:  Right foot pain.  EXAM: RIGHT FOOT COMPLETE - 3+ VIEW  COMPARISON:  None Available.  FINDINGS: Mild hallux valgus. Mild lateral great toe metatarsophalangeal joint space narrowing. Mild distal lateral anterior process of the calcaneus degenerative spurring at the calcaneocuboid articulation, seen on frontal view.  Minimal plantar and posterior calcaneal heel spurs. Minimal posterior dorsal navicular degenerative osteophytosis.  No acute fracture or dislocation.  IMPRESSION: Mild hallux valgus and minimal great toe metatarsophalangeal joint osteoarthritis.   Electronically Signed By: Neita Garnet M.D. On: 03/06/2023 10:04  Foot-L DG Complete: Results for orders placed during the hospital encounter of 03/04/23 DG Foot Complete Left  Narrative CLINICAL DATA:  Left foot pain.  EXAM: LEFT FOOT - COMPLETE 3+ VIEW  COMPARISON:  None Available.  FINDINGS: Mild-to-moderate hallux valgus with metatarsophalangeal joint angle measuring 29 degrees. Minimal lateral great toe metatarsophalangeal degenerative spurring. Minimal chronic enthesopathic change  at the Achilles insertion on the calcaneus. Mild second and third tarsometatarsal joint space narrowing. No acute fracture or dislocation.  IMPRESSION: 1. Mild-to-moderate hallux valgus. 2. Mild second and third tarsometatarsal osteoarthritis.   Electronically Signed By: Neita Garnet M.D. On: 03/06/2023 10:18  Hand Imaging: Hand-R DG Complete: Results for orders placed during the hospital encounter of 03/04/23 DG Hand Complete Right  Narrative CLINICAL DATA:  Chronic bilateral hand pain.  EXAM: RIGHT HAND - COMPLETE 3+ VIEW  COMPARISON:  None Available.  FINDINGS: Mild-to-moderate second through fifth interphalangeal joint space narrowing, greatest  within the third PIP and DIP joints. Mild peripheral degenerative osteophytes.  Minimal degenerative ossicle at the dorsal aspect of the thumb interphalangeal joint.  Mild thymic intercarpal joint space narrowing and peripheral osteophytosis. Neutral ulnar variance. No acute fracture or dislocation.  IMPRESSION: Mild-to-moderate second through fifth interphalangeal joint osteoarthritis.  Mild thumb carpometacarpal osteoarthritis.   Electronically Signed By: Neita Garnet M.D. On: 03/06/2023 10:23  Hand-L DG Complete: Results for orders placed during the hospital encounter of 03/04/23 DG Hand Complete Left  Narrative CLINICAL DATA:  Chronic bilateral hand pain.  EXAM: LEFT HAND - COMPLETE 3+ VIEW  COMPARISON:  None Available.  FINDINGS: There is diffuse decreased bone mineralization. Moderate third PIP and mild-to-moderate rest of the second through fifth interphalangeal joint space narrowing with mild peripheral osteophytes.  There is a small oblique linear lucency at the far medial/ulnar aspect of the proximal physis of the distal phalanx of fourth finger, presumably nutrient foramen as the patient has no reported recent trauma to this region suggest a fracture.  Moderate thumb carpometacarpal joint space narrowing with subchondral sclerosis and mild peripheral osteophytosis.  Neutral ulnar variance.  No acute fracture or dislocation.  IMPRESSION: 1. Mild-to-moderate second through fifth interphalangeal osteoarthritis, greatest at the third PIP joint. 2. Mild-to-moderate thumb carpometacarpal osteoarthritis. No acute fracture.   Electronically Signed By: Neita Garnet M.D. On: 03/06/2023 10:22  Complexity Note: Imaging results reviewed.                         Meds   Current Outpatient Medications:    acetaminophen (TYLENOL) 650 MG CR tablet, Take 1,950 mg by mouth every 8 (eight) hours as needed for pain., Disp: , Rfl:    azelastine (ASTELIN) 0.1 %  nasal spray, Place 1 spray into both nostrils 2 (two) times daily as needed for allergies. , Disp: , Rfl:    celecoxib (CELEBREX) 100 MG capsule, Take 100 mg by mouth daily., Disp: , Rfl:    chlorpheniramine (CHLOR-TRIMETON) 4 MG tablet, Take 4 mg by mouth 2 (two) times daily as needed for allergies., Disp: , Rfl:    cyanocobalamin (VITAMIN B12) 1000 MCG tablet, Take 1,000 mcg by mouth daily. Gummie, Disp: , Rfl:    levothyroxine (SYNTHROID) 125 MCG tablet, Take 1 tablet (125 mcg total) by mouth daily before breakfast., Disp: 90 tablet, Rfl: 1   lisinopril-hydrochlorothiazide (PRINZIDE,ZESTORETIC) 10-12.5 MG tablet, Take 1 tablet by mouth daily. , Disp: , Rfl: 2   polyethylene glycol (MIRALAX / GLYCOLAX) 17 g packet, Take 17 g by mouth daily as needed for mild constipation., Disp: 14 each, Rfl: 0   rosuvastatin (CRESTOR) 20 MG tablet, Take 20 mg by mouth daily., Disp: , Rfl:    Vitamin D, Ergocalciferol, (DRISDOL) 50000 units CAPS capsule, Take 50,000 Units by mouth every Wednesday., Disp: , Rfl: 3  ROS  Constitutional: Denies any fever or chills Gastrointestinal: No  reported hemesis, hematochezia, vomiting, or acute GI distress Musculoskeletal: Denies any acute onset joint swelling, redness, loss of ROM, or weakness Neurological: No reported episodes of acute onset apraxia, aphasia, dysarthria, agnosia, amnesia, paralysis, loss of coordination, or loss of consciousness  Allergies  Ms. Bucio is allergic to codeine.  PFSH  Drug: Ms. Brockie  reports no history of drug use. Alcohol:  reports that she does not currently use alcohol. Tobacco:  reports that she quit smoking about 8 years ago. Her smoking use included cigarettes. She started smoking about 48 years ago. She has never used smokeless tobacco. Medical:  has a past medical history of Anxiety, Arthritis, Bursitis, Closed fracture of patella (05/06/2017), Complication of anesthesia, Depression, Family history of adverse reaction to  anesthesia, Fibromyalgia, GERD (gastroesophageal reflux disease), H/O arthritis (03/06/2015), H/O: hypothyroidism (03/06/2015), History of 2019 novel coronavirus disease (COVID-19) (12/20/2020), History of kidney stones, History of migraine headaches (05/02/2015), Hypercholesteremia, Hypertension, Hypothyroidism, Knee pain (05/06/2017), PONV (postoperative nausea and vomiting), Pre-diabetes, Sepsis due to urinary tract infection (HCC) (07/02/2016), Sleep apnea, Status post total hip replacement, 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 Surgical: Ms. Wallace  has a past surgical history that includes Cholecystectomy; Neck surgery; Nissen fundoplication; Tubal ligation; Laparoscopic hysterectomy (Bilateral, 02/28/2016); Ureteroscopy with holmium laser lithotripsy (Left, 07/03/2016); Colonoscopy with propofol (N/A, 11/13/2016); Esophagogastroduodenoscopy (egd) with propofol (N/A, 11/13/2016); Abdominal hysterectomy; Esophagogastroduodenoscopy (egd) with propofol (N/A, 12/16/2018); Carpal tunnel release (Right, 09/01/2020); Cataract extraction, bilateral; Carpal tunnel release (Left, 01/05/2021); Anterior interosseous nerve decompression (Left, 01/05/2021); fundoflication (2015); and Total hip arthroplasty (Right, 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 Family: family history includes Anxiety disorder in her mother; Depression in her mother.  Constitutional Exam  General appearance: Well nourished, well developed, and well hydrated. In no apparent acute distress There were no vitals filed for this visit. BMI Assessment: Estimated body mass index is 32.42 kg/m as calculated from the following:   Height as of 03/04/23: 5' (1.524 m).   Weight as of 03/04/23: 166 lb (75.3 kg).  BMI interpretation table: BMI level Category Range association with higher incidence of chronic pain  <18 kg/m2 Underweight   18.5-24.9 kg/m2 Ideal body weight   25-29.9 kg/m2 Overweight Increased incidence by 20%  30-34.9 kg/m2  Obese (Class I) Increased incidence by 68%  35-39.9 kg/m2 Severe obesity (Class II) Increased incidence by 136%  >40 kg/m2 Extreme obesity (Class III) Increased incidence by 254%   Patient's current BMI Ideal Body weight  There is no height or weight on file to calculate BMI. Patient weight not recorded   BMI Readings from Last 4 Encounters:  03/04/23 32.42 kg/m  02/11/23 32.54 kg/m  01/11/23 32.85 kg/m  01/21/22 28.71 kg/m   Wt Readings from Last 4 Encounters:  03/04/23 166 lb (75.3 kg)  02/11/23 166 lb 9.6 oz (75.6 kg)  01/11/23 168 lb 3.2 oz (76.3 kg)  01/21/22 147 lb (66.7 kg)    Psych/Mental status: Alert, oriented x 3 (person, place, & time)       Eyes: PERLA Respiratory: No evidence of acute respiratory distress  Assessment & Plan  Primary Diagnosis & Pertinent Problem List: The primary encounter diagnosis was Chronic low back pain (1ry area of Pain) (Bilateral) (R>L) w/o sciatica. 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  Visit Diagnosis: 1. Chronic low back pain (1ry area of Pain) (Bilateral) (R>L) w/o sciatica   2. Chronic pain syndrome   3. Chronic neck pain (posterior) (3ry area of Pain) (Bilateral) (R>L)   4. Chronic shoulder pain (4th area of Pain) (Bilateral) (R>L)   5. Chronic knee pain (5th area of Pain) (Bilateral) (R>L)   6. Chronic hand pain (6th area of Pain) (Bilateral) (R=L)   7. Chronic feet pain (7th area of Pain) (Bilateral)   8. Abnormal NCS (nerve conduction studies)    9. Abnormal MRI, lumbar spine (12/11/2022)   10. 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 Lumbosacral facet arthropathy    Problems updated and reviewed during this visit: No problems updated.  Plan of Care  Pharmacotherapy (Medications Ordered): No orders of the defined types were placed in this encounter.  Procedure Orders    No procedure(s) ordered today   Lab Orders  No laboratory test(s) ordered today   Imaging Orders  No imaging studies ordered today   Referral Orders  No referral(s) requested today    Pharmacological management:  Opioid Analgesics: I will not be prescribing any opioids at this time Membrane stabilizer: I will not be prescribing any at this time Muscle relaxant: I will not be prescribing any at this time NSAID: I will not be prescribing any at this time Other analgesic(s): I will not be prescribing any at this time      Interventional Therapies  Risk Factors  Considerations:     Planned  Pending:      Under consideration:   Pending   Completed:   None at this time   Therapeutic  Palliative (PRN) options:   None established   Completed by other providers:   None reported         Provider-requested follow-up: No follow-ups on file. Recent Visits Date Type Provider Dept  03/04/23 Office Visit Delano Metz, MD [facility]  Showing recent visits within past 90 days and meeting all other requirements Future Appointments Date Type Provider Dept  03/25/23 Appointment Delano Metz, MD [facility]  Showing future appointments within next 90 days and meeting all other requirements   Primary Care Physician: Miki Kins, FNP  Duration of encounter: *** minutes.  Total time on encounter, as per AMA guidelines included both the face-to-face and non-face-to-face time personally spent by the physician and/or other qualified health care professional(s) on the day of the encounter  (includes time in activities that require the physician or other qualified health care professional and does not include time in activities normally performed by clinical staff). Physician's time may include the following activities when performed: Preparing to see the patient (e.g., pre-charting review of records, searching for previously ordered imaging, lab work, and nerve conduction tests) Review of prior analgesic pharmacotherapies. Reviewing PMP Interpreting ordered tests (e.g., lab work, imaging, nerve conduction tests) Performing post-procedure evaluations, including interpretation of diagnostic procedures Obtaining and/or reviewing separately obtained history Performing a medically appropriate examination and/or evaluation Counseling and educating the patient/family/caregiver Ordering medications, tests, or procedures Referring and communicating with other health care professionals (when not separately reported) Documenting clinical information in the electronic or other health record Independently interpreting results (not separately reported) and communicating results to the patient/ family/caregiver Care coordination (not separately reported)  Note  by: Oswaldo Done, MD (TTS technology used. I apologize for any typographical errors that were not detected and corrected.) Date: 03/25/2023; Time: 9:03 PM

## 2023-03-25 ENCOUNTER — Ambulatory Visit: Payer: BC Managed Care – PPO | Admitting: Family

## 2023-03-25 ENCOUNTER — Ambulatory Visit: Payer: BC Managed Care – PPO | Attending: Pain Medicine | Admitting: Pain Medicine

## 2023-03-25 ENCOUNTER — Encounter: Payer: Self-pay | Admitting: Pain Medicine

## 2023-03-25 VITALS — BP 134/70 | HR 81 | Ht 60.0 in | Wt 168.0 lb

## 2023-03-25 VITALS — BP 149/71 | HR 76 | Temp 97.4°F | Resp 18 | Ht 60.0 in | Wt 165.0 lb

## 2023-03-25 DIAGNOSIS — M19012 Primary osteoarthritis, left shoulder: Secondary | ICD-10-CM | POA: Insufficient documentation

## 2023-03-25 DIAGNOSIS — M170 Bilateral primary osteoarthritis of knee: Secondary | ICD-10-CM | POA: Diagnosis present

## 2023-03-25 DIAGNOSIS — G894 Chronic pain syndrome: Secondary | ICD-10-CM | POA: Diagnosis not present

## 2023-03-25 DIAGNOSIS — M19011 Primary osteoarthritis, right shoulder: Secondary | ICD-10-CM

## 2023-03-25 DIAGNOSIS — M542 Cervicalgia: Secondary | ICD-10-CM | POA: Diagnosis present

## 2023-03-25 DIAGNOSIS — F1291 Cannabis use, unspecified, in remission: Secondary | ICD-10-CM | POA: Diagnosis present

## 2023-03-25 DIAGNOSIS — M79642 Pain in left hand: Secondary | ICD-10-CM | POA: Diagnosis present

## 2023-03-25 DIAGNOSIS — R937 Abnormal findings on diagnostic imaging of other parts of musculoskeletal system: Secondary | ICD-10-CM

## 2023-03-25 DIAGNOSIS — M47817 Spondylosis without myelopathy or radiculopathy, lumbosacral region: Secondary | ICD-10-CM | POA: Diagnosis present

## 2023-03-25 DIAGNOSIS — I10 Essential (primary) hypertension: Secondary | ICD-10-CM

## 2023-03-25 DIAGNOSIS — M25551 Pain in right hip: Secondary | ICD-10-CM | POA: Insufficient documentation

## 2023-03-25 DIAGNOSIS — M25561 Pain in right knee: Secondary | ICD-10-CM | POA: Diagnosis present

## 2023-03-25 DIAGNOSIS — M47816 Spondylosis without myelopathy or radiculopathy, lumbar region: Secondary | ICD-10-CM

## 2023-03-25 DIAGNOSIS — M25511 Pain in right shoulder: Secondary | ICD-10-CM | POA: Insufficient documentation

## 2023-03-25 DIAGNOSIS — M19071 Primary osteoarthritis, right ankle and foot: Secondary | ICD-10-CM | POA: Diagnosis present

## 2023-03-25 DIAGNOSIS — M79672 Pain in left foot: Secondary | ICD-10-CM | POA: Insufficient documentation

## 2023-03-25 DIAGNOSIS — E559 Vitamin D deficiency, unspecified: Secondary | ICD-10-CM | POA: Diagnosis present

## 2023-03-25 DIAGNOSIS — M4312 Spondylolisthesis, cervical region: Secondary | ICD-10-CM

## 2023-03-25 DIAGNOSIS — G8929 Other chronic pain: Secondary | ICD-10-CM | POA: Diagnosis present

## 2023-03-25 DIAGNOSIS — M47812 Spondylosis without myelopathy or radiculopathy, cervical region: Secondary | ICD-10-CM

## 2023-03-25 DIAGNOSIS — M150 Primary generalized (osteo)arthritis: Secondary | ICD-10-CM

## 2023-03-25 DIAGNOSIS — M25552 Pain in left hip: Secondary | ICD-10-CM | POA: Insufficient documentation

## 2023-03-25 DIAGNOSIS — M960 Pseudarthrosis after fusion or arthrodesis: Secondary | ICD-10-CM

## 2023-03-25 DIAGNOSIS — M4310 Spondylolisthesis, site unspecified: Secondary | ICD-10-CM

## 2023-03-25 DIAGNOSIS — M79641 Pain in right hand: Secondary | ICD-10-CM

## 2023-03-25 DIAGNOSIS — E039 Hypothyroidism, unspecified: Secondary | ICD-10-CM | POA: Diagnosis not present

## 2023-03-25 DIAGNOSIS — M19072 Primary osteoarthritis, left ankle and foot: Secondary | ICD-10-CM | POA: Diagnosis present

## 2023-03-25 DIAGNOSIS — R892 Abnormal level of other drugs, medicaments and biological substances in specimens from other organs, systems and tissues: Secondary | ICD-10-CM

## 2023-03-25 DIAGNOSIS — M545 Low back pain, unspecified: Secondary | ICD-10-CM | POA: Diagnosis present

## 2023-03-25 DIAGNOSIS — M160 Bilateral primary osteoarthritis of hip: Secondary | ICD-10-CM

## 2023-03-25 DIAGNOSIS — R94130 Abnormal response to nerve stimulation, unspecified: Secondary | ICD-10-CM

## 2023-03-25 DIAGNOSIS — E782 Mixed hyperlipidemia: Secondary | ICD-10-CM

## 2023-03-25 DIAGNOSIS — M159 Polyosteoarthritis, unspecified: Secondary | ICD-10-CM | POA: Diagnosis present

## 2023-03-25 DIAGNOSIS — R7303 Prediabetes: Secondary | ICD-10-CM

## 2023-03-25 DIAGNOSIS — E611 Iron deficiency: Secondary | ICD-10-CM | POA: Diagnosis not present

## 2023-03-25 DIAGNOSIS — M4316 Spondylolisthesis, lumbar region: Secondary | ICD-10-CM | POA: Diagnosis present

## 2023-03-25 DIAGNOSIS — E538 Deficiency of other specified B group vitamins: Secondary | ICD-10-CM

## 2023-03-25 DIAGNOSIS — M25512 Pain in left shoulder: Secondary | ICD-10-CM | POA: Diagnosis present

## 2023-03-25 DIAGNOSIS — M79671 Pain in right foot: Secondary | ICD-10-CM | POA: Diagnosis present

## 2023-03-25 DIAGNOSIS — M25562 Pain in left knee: Secondary | ICD-10-CM | POA: Insufficient documentation

## 2023-03-25 HISTORY — DX: Abnormal findings on diagnostic imaging of other parts of musculoskeletal system: R93.7

## 2023-03-25 HISTORY — DX: Cannabis use, unspecified, in remission: F12.91

## 2023-03-25 HISTORY — DX: Abnormal level of other drugs, medicaments and biological substances in specimens from other organs, systems and tissues: R89.2

## 2023-03-25 HISTORY — DX: Pseudarthrosis after fusion or arthrodesis: M96.0

## 2023-03-25 MED ORDER — VITAMIN D3 125 MCG (5000 UT) PO CAPS
5000.00 [IU] | ORAL_CAPSULE | Freq: Every day | ORAL | 0 refills | Status: DC
Start: 2023-03-25 — End: 2023-05-21

## 2023-03-25 MED ORDER — ERGOCALCIFEROL 1.25 MG (50000 UT) PO CAPS
50000.0000 [IU] | ORAL_CAPSULE | ORAL | 0 refills | Status: AC
Start: 2023-03-25 — End: 2023-05-06

## 2023-03-25 MED ORDER — METHOCARBAMOL 1000 MG/10ML IJ SOLN
INTRAMUSCULAR | Status: AC
  Filled 2023-03-25: qty 10

## 2023-03-25 MED ORDER — KETOROLAC TROMETHAMINE 60 MG/2ML IM SOLN
60.0000 mg | Freq: Once | INTRAMUSCULAR | Status: AC
  Administered 2023-03-25: 60 mg via INTRAMUSCULAR

## 2023-03-25 MED ORDER — TURMERIC 500 MG PO CAPS
500.00 mg | ORAL_CAPSULE | Freq: Every day | ORAL | 0 refills | Status: DC
Start: 2023-03-25 — End: 2023-05-21

## 2023-03-25 MED ORDER — KETOROLAC TROMETHAMINE 60 MG/2ML IM SOLN
INTRAMUSCULAR | Status: AC
  Filled 2023-03-25: qty 2

## 2023-03-25 MED ORDER — METHOCARBAMOL 1000 MG/10ML IJ SOLN
200.0000 mg | Freq: Once | INTRAMUSCULAR | Status: AC
  Administered 2023-03-25: 200 mg via INTRAMUSCULAR

## 2023-03-25 NOTE — Progress Notes (Signed)
Safety precautions to be maintained throughout the outpatient stay will include: orient to surroundings, keep bed in low position, maintain call bell within reach at all times, provide assistance with transfer out of bed and ambulation.  

## 2023-03-25 NOTE — Patient Instructions (Signed)

## 2023-03-26 ENCOUNTER — Encounter: Payer: Self-pay | Admitting: Family

## 2023-03-26 NOTE — Progress Notes (Signed)
Established Patient Office Visit  Subjective:  Patient ID: Evelyn Bentley, female    DOB: 10-Oct-1956  Age: 67 y.o. MRN: [id number]  Chief Complaint  Patient presents with   Follow-up    6 week follow up    Patient is here today for her 6 week  follow up.  She has been feeling about the same since last appointment.   She does have additional concerns to discuss today.  She went to see Dr. Laban Emperor, and he did give her a shot in her hip today, but they are not accepting new pain medication management people.   Labs are due today. She needs refills.   I have reviewed her active problem list, medication list, allergies, notes from last encounter, lab results, and specialist notes for her appointment today.   No other concerns at this time.   Past Medical History:  Diagnosis Date   Anxiety    Arthritis    Bursitis    Rt hip   Closed fracture of patella 05/06/2017   Complication of anesthesia    a.) PONV. b.) intra/postoperative hypotension 08/2020   Depression    Family history of adverse reaction to anesthesia    a.) mother - severe PONV   Fibromyalgia    GERD (gastroesophageal reflux disease)    H/O arthritis 03/06/2015   H/O: hypothyroidism 03/06/2015   History of 2019 novel coronavirus disease (COVID-19) 12/20/2020   a.) preoperative PCR (+) on 12/20/2020; retested following day per pt/surgeon request; PCR once again (+) on 12/21/2020   History of kidney stones    History of migraine headaches 05/02/2015   Hypercholesteremia    Hypertension    Hypothyroidism    Knee pain 05/06/2017   PONV (postoperative nausea and vomiting)    Pre-diabetes    Sepsis due to urinary tract infection (HCC) 07/02/2016   Sleep apnea    MILD- DOESN'T NEED cpap   Status post total hip replacement, 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   Thyroid disease    Weakness of limb 05/06/2017    Past Surgical History:  Procedure Laterality Date   ABDOMINAL HYSTERECTOMY     ANTERIOR INTEROSSEOUS NERVE  DECOMPRESSION Left 01/05/2021   Procedure: Cubital tunnel release, left;  Surgeon: Kennedy Bucker, MD;  Location: [facility];  Service: Orthopedics;  Laterality: Left;   CARPAL TUNNEL RELEASE Right 09/01/2020   Procedure: Right carpal tunnel release;  Surgeon: Kennedy Bucker, MD;  Location: [facility];  Service: Orthopedics;  Laterality: Right;   CARPAL TUNNEL RELEASE Left 01/05/2021   Procedure: Carpal tunnel release, left;  Surgeon: Kennedy Bucker, MD;  Location: [facility];  Service: Orthopedics;  Laterality: Left;   CATARACT EXTRACTION, BILATERAL     CHOLECYSTECTOMY     COLONOSCOPY WITH PROPOFOL N/A 11/13/2016   Procedure: COLONOSCOPY WITH PROPOFOL;  Surgeon: Christena Deem, MD;  Location[facility];  Service: Endoscopy;  Laterality: N/A;   ESOPHAGOGASTRODUODENOSCOPY (EGD) WITH PROPOFOL N/A 11/13/2016   Procedure: ESOPHAGOGASTRODUODENOSCOPY (EGD) WITH PROPOFOL;  Surgeon: Christena Deem, MD;  Location: [facility] ENDOSCOPY;  Service: Endoscopy;  Laterality: N/A;   ESOPHAGOGASTRODUODENOSCOPY (EGD) WITH PROPOFOL N/A 12/16/2018   Procedure: ESOPHAGOGASTRODUODENOSCOPY (EGD) WITH PROPOFOL;  Surgeon: Christena Deem, MD;  Location: [facility] ENDOSCOPY;  Service: Endoscopy;  Laterality: N/A;   fundoflication  2015   at [facility] hospital   LAPAROSCOPIC HYSTERECTOMY Bilateral 02/28/2016   Procedure: HYSTERECTOMY TOTAL LAPAROSCOPIC / BSO;  Surgeon: Elenora Fender Ward, MD;  Location: [facility];  Service: Gynecology;  Laterality: Bilateral;   NECK SURGERY  X 2   NISSEN FUNDOPLICATION     TOTAL HIP ARTHROPLASTY Right 01/02/2022   Procedure: TOTAL HIP ARTHROPLASTY ANTERIOR APPROACH;  Surgeon: Kennedy Bucker, MD;  Location: [facility];  Service: Orthopedics;  Laterality: Right;   TUBAL LIGATION     URETEROSCOPY WITH HOLMIUM LASER LITHOTRIPSY Left 07/03/2016   Procedure: URETEROSCOPY WITH HOLMIUM LASER LITHOTRIPSY;  Surgeon: Orson Ape, MD;  Location: [facility];  Service: Urology;  Laterality: Left;    Social  History   Socioeconomic History   Marital status: Widowed    Spouse name: Not on file   Number of children: 2   Years of education: Not on file   Highest education level: High school graduate  Occupational History    Comment: fulltime  Tobacco Use   Smoking status: Former    Types: Cigarettes    Start date: 11/14/1974    Quit date: 05/14/2014    Years since quitting: 8.8   Smokeless tobacco: Never  Vaping Use   Vaping Use: Every day   Substances: Nicotine  Substance and Sexual Activity   Alcohol use: Not Currently    Alcohol/week: 0.0 - 1.0 standard drinks of alcohol   Drug use: No   Sexual activity: Yes    Partners: Male    Birth control/protection: Condom  Other Topics Concern   Not on file  Social History Narrative   Not on file   Social Determinants of Health   Financial Resource Strain: Low Risk  (11/25/2017)   Overall Financial Resource Strain (CARDIA)    Difficulty of Paying Living Expenses: Not hard at all  Food Insecurity: No Food Insecurity (11/25/2017)   Hunger Vital Sign    Worried About Running Out of Food in the Last Year: Never true    Ran Out of Food in the Last Year: Never true  Transportation Needs: No Transportation Needs (11/25/2017)   PRAPARE - Administrator, Civil Service (Medical): No    Lack of Transportation (Non-Medical): No  Physical Activity: Inactive (11/25/2017)   Exercise Vital Sign    Days of Exercise per Week: 0 days    Minutes of Exercise per Session: 0 min  Stress: Stress Concern Present (11/25/2017)   [employer] of Occupational Health - Occupational Stress Questionnaire    Feeling of Stress : Very much  Social Connections: Moderately Isolated (11/25/2017)   Social Connection and Isolation Panel [NHANES]    Frequency of Communication with Friends and Family: Never    Frequency of Social Gatherings with Friends and Family: More than three times a week    Attends Religious Services: Never    Database administrator or  Organizations: No    Attends Banker Meetings: Never    Marital Status: Widowed  Intimate Partner Violence: Not At Risk (11/25/2017)   Humiliation, Afraid, Rape, and Kick questionnaire    Fear of Current or Ex-Partner: No    Emotionally Abused: No    Physically Abused: No    Sexually Abused: No    Family History  Problem Relation Age of Onset   Depression Mother    Anxiety disorder Mother    Breast cancer Neg Hx     Allergies  Allergen Reactions   Codeine Nausea Only    Review of Systems  Constitutional:  Positive for malaise/fatigue.  Musculoskeletal:  Positive for back pain, joint pain, myalgias and neck pain.  Psychiatric/Behavioral:  Positive for depression.   All other systems reviewed and are negative.  Objective:   BP 134/70   Pulse 81   Ht 5' (1.524 m)   Wt 168 lb (76.2 kg)   SpO2 97%   BMI 32.81 kg/m   Vitals:   03/25/23 1304  BP: 134/70  Pulse: 81  Height: 5' (1.524 m)  Weight: 168 lb (76.2 kg)  SpO2: 97%  BMI (Calculated): 32.81    Physical Exam Vitals and nursing note reviewed.  Constitutional:      General: She is awake. She is not in acute distress.    Appearance: Normal appearance. She is obese. She is not ill-appearing.  HENT:     Head: Normocephalic and atraumatic.     Nose: Nose normal.  Eyes:     Extraocular Movements: Extraocular movements intact.     Conjunctiva/sclera: Conjunctivae normal.     Pupils: Pupils are equal, round, and reactive to light.  Cardiovascular:     Rate and Rhythm: Normal rate.  Pulmonary:     Effort: Pulmonary effort is normal.  Neurological:     General: No focal deficit present.     Mental Status: She is alert and oriented to person, place, and time.  Psychiatric:        Mood and Affect: Mood normal.        Behavior: Behavior normal. Behavior is cooperative.        Thought Content: Thought content normal.        Judgment: Judgment normal.      No results found for any visits  on 03/25/23.  Recent Results (from the past 2160 hour(s))  Celiac panel 10     Status: None   Collection Time: 01/11/23 10:23 AM  Result Value Ref Range   Antigliadin Abs, IgA 6 0 - 19 units    Comment:                    Negative                   0 - 19                    Weak Positive             20 - 30                    Moderate to Strong Positive   >30    Gliadin IgG 2 0 - 19 units    Comment:                    Negative                   0 - 19                    Weak Positive             20 - 30                    Moderate to Strong Positive   >30    Transglutaminase IgA <2 0 - 3 U/mL    Comment:                               Negative        0 -  3  Weak Positive   4 - 10                               Positive           >10  Tissue Transglutaminase (tTG) has been identified  as the endomysial antigen.  Studies have demonstr-  ated that endomysial IgA antibodies have over 99%  specificity for gluten sensitive enteropathy.    Tissue Transglut Ab <2 0 - 5 U/mL    Comment:                               Negative        0 - 5                               Weak Positive   6 - 9                               Positive           >9    Endomysial IgA Negative Negative   IgA/Immunoglobulin A, Serum 320 87 - 352 mg/dL  Coxsackie B Virus Antibodies     Status: Abnormal   Collection Time: 01/11/23 10:23 AM  Result Value Ref Range   Coxsackie B-1 Ab 1:1000 (H) Neg:<1:100   Coxsackie B-2 Ab 1:100 (H) Neg:<1:100   Coxsackie B-3 Ab 1:500 (H) Neg:<1:100   Coxsackie B-4 Ab 1:500 (H) Neg:<1:100   Coxsackie B-5 Ab 1:1000 (H) Neg:<1:100   Coxsackie B-6 Ab 1:500 (H) Neg:<1:100  Parvovirus B19 Antibody, IGG and IGM     Status: Abnormal   Collection Time: 01/11/23 10:23 AM  Result Value Ref Range   Parvovirus B19 IgG 6.7 (H) 0.0 - 0.8 index    Comment:                                  Negative        <0.9                                  Equivocal  0.9 - 1.1                                   Positive        >1.1    Parvovirus B19 IgM 0.1 0.0 - 0.8 index    Comment:                                  Negative        <0.9                                  Equivocal  0.9 - 1.1                                  Positive        >  1.1   Human parvovirus DNA detection by PCR     Status: None   Collection Time: 01/11/23 10:23 AM  Result Value Ref Range   Parvovirus B19, PCR Negative Negative    Comment: No Parvovirus B19 DNA detected. This test was developed and its performance characteristics determined by [employer]. It has not been cleared or approved by the U.[place]. Food and Drug Administration. The FDA has determined that such clearance or approval is not necessary. This test is used for clinical purposes. It should not be regarded as investigational or research.   Sedimentation rate     Status: None   Collection Time: 01/11/23 10:23 AM  Result Value Ref Range   Sed Rate 2 0 - 40 mm/hr  CRP (C-Reactive Protein)     Status: None   Collection Time: 01/11/23 10:23 AM  Result Value Ref Range   CRP <1 0 - 10 mg/L  Magnesium     Status: None   Collection Time: 03/04/23  2:48 PM  Result Value Ref Range   Magnesium 2.3 1.6 - 2.3 mg/dL  Compliance Drug Analysis, Ur     Status: None   Collection Time: 03/04/23  2:54 PM  Result Value Ref Range   Summary Note     Comment: ==================================================================== Compliance Drug Analysis, Ur ==================================================================== Test                             Result       Flag       Units  Drug Present and Declared for Prescription Verification   Acetaminophen                  PRESENT      EXPECTED  Drug Present not Declared for Prescription Verification   Carboxy-THC                    516          UNEXPECTED ng/mg creat    Carboxy-THC is a metabolite of tetrahydrocannabinol (THC). Source of    THC is most commonly herbal marijuana  or marijuana-based products,    but THC is also present in a scheduled prescription medication.    Trace amounts of THC can be present in hemp and cannabidiol (CBD)    products. This test is not intended to distinguish between delta-9-    tetrahydrocannabinol, the predominant form of THC in most herbal or    marijuana-based products, and delta-8-tetrahydrocannabinol.    Dextrorphan/Levorphanol         PRESENT      UNEXPECTED    Dextrorphan is an expected metabolite of dextromethorphan, an over-    the-counter or prescription cough suppressant. Levorphanol is a    scheduled prescription medication. Dextrorphan cannot be    distinguished from levorphanol by the method used for analysis.  Drug Absent but Declared for Prescription Verification   Chlorpheniramine               Not Detected UNEXPECTED ==================================================================== Test                      Result    Flag   Units      Ref Range   Creatinine              165              mg/dL      >=21 ====================================================================  Declared Medications:  The flagging and interpretation on this report are based on the  following declared medications.  Unexpected results may arise from  inaccuracies in the declared medications.   **Note: The testing scope of this panel includes these medications:   Chlorpheniramine   **Note: The testing scop e of this panel does not include small to  moderate amounts of these reported medications:   Acetaminophen (Tylenol)   **Note: The testing scope of this panel does not include the  following reported medications:   Azelastine (Astelin)  Celecoxib (Celebrex)  Hydrochlorothiazide (Zestoretic)  Levothyroxine (Synthroid)  Lisinopril (Zestoretic)  Polyethylene Glycol (MiraLAX)  Rosuvastatin (Crestor)  Vitamin B12  Vitamin D2 (Drisdol) ==================================================================== For clinical  consultation, please call [phone]. ====================================================================        Assessment & Plan:   Problem List Items Addressed This Visit       Active Problems   Hypothyroidism   Relevant Orders   CBC With Differential   CMP14+EGFR   TSH   Chronic pain syndrome - Primary (Chronic)   Relevant Orders   Ambulatory referral to Pain Clinic   CBC With Differential   CMP14+EGFR   Iron deficiency   Relevant Orders   CBC With Differential   CMP14+EGFR   Other Visit Diagnoses     Mixed hyperlipidemia       Relevant Orders   Lipid panel   CBC With Differential   CMP14+EGFR   Prediabetes       Relevant Orders   CBC With Differential   CMP14+EGFR   Hemoglobin A1c   Vitamin D deficiency, unspecified       Relevant Orders   VITAMIN D 25 Hydroxy (Vit-D Deficiency, Fractures)   CBC With Differential   CMP14+EGFR   B12 deficiency due to diet       Relevant Orders   CBC With Differential   CMP14+EGFR   Vitamin B12   Essential hypertension, benign       Relevant Orders   CBC With Differential   CMP14+EGFR       Return in about 3 months (around 06/25/2023).   Total time spent: 30 minutes  Miki Kins, FNP  03/25/2023

## 2023-04-05 ENCOUNTER — Other Ambulatory Visit: Payer: BC Managed Care – PPO

## 2023-04-05 ENCOUNTER — Telehealth: Payer: Self-pay

## 2023-04-05 NOTE — Telephone Encounter (Signed)
Returned patient phone call re; left arm pain.  We went over s/s  that she is having with the arm and all the remedies and medications she has used.  I did ask if she thought the pain had anything to do with her heart.  She was very sure that it was not that but coming from the shoulder and arm.  I ask if she was currently on any ansaids and she denies.  Asked if she could take ibuprofen safely and she said she could.  I told her that typically we don't recommend ibuprofen on a regular basis but she could try short term and see if it would give her any relief.   Also, instead of heat she could try ice.  Patient verbalizes u/o information.  If no improvement with these suggestion she could go to Urgent Care.

## 2023-04-05 NOTE — Telephone Encounter (Signed)
Her left arm and shoulder is in agony. She wants to know if there is anything you can advise her to do. She has tried Tylenol, the gel, heat pad, nothing helps. She is thinking of going to the ER.

## 2023-04-06 LAB — CBC WITH DIFFERENTIAL
Basophils Absolute: 0.1 10*3/uL (ref 0.0–0.2)
Basos: 1 %
EOS (ABSOLUTE): 0.1 10*3/uL (ref 0.0–0.4)
Eos: 1 %
Hematocrit: 43.2 % (ref 34.0–46.6)
Hemoglobin: 14.5 g/dL (ref 11.1–15.9)
Immature Grans (Abs): 0 10*3/uL (ref 0.0–0.1)
Immature Granulocytes: 0 %
Lymphocytes Absolute: 1.7 10*3/uL (ref 0.7–3.1)
Lymphs: 22 %
MCH: 29.8 pg (ref 26.6–33.0)
MCHC: 33.6 g/dL (ref 31.5–35.7)
MCV: 89 fL (ref 79–97)
Monocytes Absolute: 0.6 10*3/uL (ref 0.1–0.9)
Monocytes: 7 %
Neutrophils Absolute: 5.6 10*3/uL (ref 1.4–7.0)
Neutrophils: 69 %
RBC: 4.86 x10E6/uL (ref 3.77–5.28)
RDW: 13.1 % (ref 11.7–15.4)
WBC: 8 10*3/uL (ref 3.4–10.8)

## 2023-04-06 LAB — CMP14+EGFR
ALT: 15 IU/L (ref 0–32)
AST: 21 IU/L (ref 0–40)
Albumin/Globulin Ratio: 2 (ref 1.2–2.2)
Albumin: 4.5 g/dL (ref 3.9–4.9)
Alkaline Phosphatase: 88 IU/L (ref 44–121)
BUN/Creatinine Ratio: 14 (ref 12–28)
BUN: 13 mg/dL (ref 8–27)
Bilirubin Total: 0.7 mg/dL (ref 0.0–1.2)
CO2: 23 mmol/L (ref 20–29)
Calcium: 9.6 mg/dL (ref 8.7–10.3)
Chloride: 104 mmol/L (ref 96–106)
Creatinine, Ser: 0.9 mg/dL (ref 0.57–1.00)
Globulin, Total: 2.2 g/dL (ref 1.5–4.5)
Glucose: 108 mg/dL — ABNORMAL HIGH (ref 70–99)
Potassium: 4.3 mmol/L (ref 3.5–5.2)
Sodium: 142 mmol/L (ref 134–144)
Total Protein: 6.7 g/dL (ref 6.0–8.5)
eGFR: 71 mL/min/{1.73_m2} (ref >59)

## 2023-04-06 LAB — HEMOGLOBIN A1C
Est. average glucose Bld gHb Est-mCnc: 128 mg/dL
Hgb A1c MFr Bld: 6.1 % — ABNORMAL HIGH (ref 4.8–5.6)

## 2023-04-06 LAB — VITAMIN D 25 HYDROXY (VIT D DEFICIENCY, FRACTURES): Vit D, 25-Hydroxy: 57.7 ng/mL (ref 30.0–100.0)

## 2023-04-06 LAB — LIPID PANEL
Chol/HDL Ratio: 2.7 ratio (ref 0.0–4.4)
Cholesterol, Total: 140 mg/dL (ref 100–199)
HDL: 51 mg/dL (ref >39)
LDL Chol Calc (NIH): 73 mg/dL (ref 0–99)
Triglycerides: 80 mg/dL (ref 0–149)
VLDL Cholesterol Cal: 16 mg/dL (ref 5–40)

## 2023-04-06 LAB — TSH: TSH: 0.742 u[IU]/mL (ref 0.450–4.500)

## 2023-04-06 LAB — VITAMIN B12: Vitamin B-12: >2000 pg/mL — ABNORMAL HIGH (ref 232–1245)

## 2023-04-07 ENCOUNTER — Encounter: Payer: Self-pay | Admitting: Family

## 2023-04-23 ENCOUNTER — Encounter: Payer: Self-pay | Admitting: Pain Medicine

## 2023-04-23 ENCOUNTER — Ambulatory Visit: Payer: Medicare Other | Attending: Pain Medicine | Admitting: Pain Medicine

## 2023-04-23 ENCOUNTER — Ambulatory Visit
Admission: RE | Admit: 2023-04-23 | Discharge: 2023-04-23 | Disposition: A | Discharge status: Home / Self Care | Payer: BC Managed Care – PPO | Source: Ambulatory Visit | Attending: Pain Medicine | Admitting: Pain Medicine

## 2023-04-23 VITALS — BP 134/69 | HR 75 | Temp 97.1°F | Resp 17 | Ht 60.0 in | Wt 168.0 lb

## 2023-04-23 DIAGNOSIS — M4310 Spondylolisthesis, site unspecified: Secondary | ICD-10-CM | POA: Insufficient documentation

## 2023-04-23 DIAGNOSIS — M47816 Spondylosis without myelopathy or radiculopathy, lumbar region: Secondary | ICD-10-CM | POA: Insufficient documentation

## 2023-04-23 DIAGNOSIS — M4316 Spondylolisthesis, lumbar region: Secondary | ICD-10-CM | POA: Diagnosis present

## 2023-04-23 DIAGNOSIS — R937 Abnormal findings on diagnostic imaging of other parts of musculoskeletal system: Secondary | ICD-10-CM | POA: Insufficient documentation

## 2023-04-23 DIAGNOSIS — M47817 Spondylosis without myelopathy or radiculopathy, lumbosacral region: Secondary | ICD-10-CM | POA: Insufficient documentation

## 2023-04-23 DIAGNOSIS — Z5189 Encounter for other specified aftercare: Secondary | ICD-10-CM | POA: Insufficient documentation

## 2023-04-23 DIAGNOSIS — G8929 Other chronic pain: Secondary | ICD-10-CM | POA: Diagnosis present

## 2023-04-23 DIAGNOSIS — M545 Low back pain, unspecified: Secondary | ICD-10-CM | POA: Insufficient documentation

## 2023-04-23 DIAGNOSIS — M5459 Other low back pain: Secondary | ICD-10-CM | POA: Diagnosis present

## 2023-04-23 MED ORDER — TRIAMCINOLONE ACETONIDE 40 MG/ML IJ SUSP
INTRAMUSCULAR | Status: AC
  Filled 2023-04-23: qty 2

## 2023-04-23 MED ORDER — LIDOCAINE HCL 2 % IJ SOLN
20.0000 mL | Freq: Once | INTRAMUSCULAR | Status: AC
  Administered 2023-04-23: 400 mg

## 2023-04-23 MED ORDER — TRIAMCINOLONE ACETONIDE 40 MG/ML IJ SUSP
80.0000 mg | Freq: Once | INTRAMUSCULAR | Status: AC
  Administered 2023-04-23: 80 mg

## 2023-04-23 MED ORDER — PENTAFLUOROPROP-TETRAFLUOROETH EX AERO
INHALATION_SPRAY | Freq: Once | CUTANEOUS | Status: AC
  Administered 2023-04-23: 30 via TOPICAL
  Filled 2023-04-23: qty 116

## 2023-04-23 MED ORDER — FENTANYL CITRATE (PF) 100 MCG/2ML IJ SOLN
25.0000 ug | INTRAMUSCULAR | Status: DC | PRN
  Administered 2023-04-23: 50 ug via INTRAVENOUS

## 2023-04-23 MED ORDER — ROPIVACAINE HCL 2 MG/ML IJ SOLN
INTRAMUSCULAR | Status: AC
  Filled 2023-04-23: qty 20

## 2023-04-23 MED ORDER — MIDAZOLAM HCL 5 MG/5ML IJ SOLN
0.5000 mg | Freq: Once | INTRAMUSCULAR | Status: AC
  Administered 2023-04-23: 2 mg via INTRAVENOUS

## 2023-04-23 MED ORDER — MIDAZOLAM HCL 5 MG/5ML IJ SOLN
INTRAMUSCULAR | Status: AC
  Filled 2023-04-23: qty 5

## 2023-04-23 MED ORDER — ROPIVACAINE HCL 2 MG/ML IJ SOLN
18.0000 mL | Freq: Once | INTRAMUSCULAR | Status: AC
  Administered 2023-04-23: 18 mL via PERINEURAL

## 2023-04-23 MED ORDER — LACTATED RINGERS IV SOLN: Freq: Once | INTRAVENOUS | Status: AC

## 2023-04-23 MED ORDER — FENTANYL CITRATE (PF) 100 MCG/2ML IJ SOLN
INTRAMUSCULAR | Status: AC
  Filled 2023-04-23: qty 2

## 2023-04-23 MED ORDER — LIDOCAINE HCL 2 % IJ SOLN
INTRAMUSCULAR | Status: AC
  Filled 2023-04-23: qty 20

## 2023-04-23 NOTE — Patient Instructions (Signed)

## 2023-04-23 NOTE — Progress Notes (Signed)
PROVIDER NOTE: Interpretation of information contained herein should be left to medically-trained personnel. Specific patient instructions are provided elsewhere under "Patient Instructions" section of medical record. This document was created in part using STT-dictation technology, any transcriptional errors that may result from this process are unintentional.  Patient: Evelyn Bentley Type: Established DOB: 09/28/1956 MRN: [id number] PCP: Miki Kins, FNP  Service: Procedure DOS: 04/23/2023 Setting: Ambulatory Location: Ambulatory outpatient facility Delivery: Face-to-face Provider: Oswaldo Done, MD Specialty: Interventional Pain Management Specialty designation: 09 Location: Outpatient facility Ref. Prov.: No ref. provider found       Interventional Therapy   Procedure: Lumbar Facet, Medial Branch Block(s) #1  Laterality: Bilateral  Level: L2, L3, L4, L5, and S1 Medial Branch Level(s). Injecting these levels blocks the L3-4, L4-5, and L5-S1 lumbar facet joints. Imaging: Fluoroscopic guidance         Anesthesia: Local anesthesia (1-2% Lidocaine) Anxiolysis: IV Versed         Sedation: Moderate Sedation                       DOS: 04/23/2023 Performed by: Oswaldo Done, MD  Primary Purpose: Diagnostic/Therapeutic Indications: Low back pain severe enough to impact quality of life or function. 1. Lumbar facet syndrome   2. Lumbosacral facet arthropathy   3. Lumbar facet arthropathy (Bilateral)   4. Spondylosis without myelopathy or radiculopathy, lumbosacral region   5. Lumbar facet joint pain   6. 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 Grade 1 Retrolisthesis of L1/L2 (1-2 mm)   8. Chronic low back pain (1ry area of Pain) (Bilateral) (R>L) w/o sciatica   9. Osteoarthritis of lumbar facet   10. Osteoarthritis of lumbar spine    NAS-11 Pain score:   Pre-procedure: 3 /10   Post-procedure: 0-No pain/10     Position / Prep / Materials:   Position: Prone  Prep solution: DuraPrep (Iodine Povacrylex [0.7% available iodine] and Isopropyl Alcohol, 74% w/w) Area Prepped: Posterolateral Lumbosacral Spine (Wide prep: From the lower border of the scapula down to the end of the tailbone and from flank to flank.)  Materials:  Tray: Block Needle(s):  Type: Spinal  Gauge (G): 22  Length: 5-in Qty: 4      Pre-op H&P Assessment:  Ms. Beatie is a 67 y.o. (year old), female patient, seen today for interventional treatment. She  has a past surgical history that includes Cholecystectomy; Neck surgery; Nissen fundoplication; Tubal ligation; Laparoscopic hysterectomy (Bilateral, 02/28/2016); Ureteroscopy with holmium laser lithotripsy (Left, 07/03/2016); Colonoscopy with propofol (N/A, 11/13/2016); Esophagogastroduodenoscopy (egd) with propofol (N/A, 11/13/2016); Abdominal hysterectomy; Esophagogastroduodenoscopy (egd) with propofol (N/A, 12/16/2018); Carpal tunnel release (Right, 09/01/2020); Cataract extraction, bilateral; Carpal tunnel release (Left, 01/05/2021); Anterior interosseous nerve decompression (Left, 01/05/2021); fundoflication (2015); and Total hip arthroplasty (Right, 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 Ms. Swango has a current medication list which includes the following prescription(s): acetaminophen, azelastine, celecoxib, chlorpheniramine, vitamin d3, cyanocobalamin, ergocalciferol, levothyroxine, lisinopril-hydrochlorothiazide, polyethylene glycol, rosuvastatin, and turmeric, and the following Facility-Administered Medications: fentanyl and lactated ringers. Her primarily concern today is the Back Pain (lower)  Initial Vital Signs:  Pulse/HCG Rate: 75ECG Heart Rate: 80 (NSR) Temp: (!) 97.3 F (36.3 C) Resp: 16 BP: (!) 151/84 SpO2: 100 %  BMI: Estimated body mass index is 32.81 kg/m as calculated from the following:   Height as of this encounter: 5' (1.524 m).   Weight as of this encounter: 168 lb (76.2 kg).  Risk Assessment: Allergies:  Reviewed. She is allergic to  codeine.  Allergy Precautions: None required Coagulopathies: Reviewed. None identified.  Blood-thinner therapy: None at this time Active Infection(s): Reviewed. None identified. Ms. Forren is afebrile  Site Confirmation: Ms. Franz was asked to confirm the procedure and laterality before marking the site Procedure checklist: Completed Consent: Before the procedure and under the influence of no sedative(s), amnesic(s), or anxiolytics, the patient was informed of the treatment options, risks and possible complications. To fulfill our ethical and legal obligations, as recommended by the American Medical Association's Code of Ethics, I have informed the patient of my clinical impression; the nature and purpose of the treatment or procedure; the risks, benefits, and possible complications of the intervention; the alternatives, including doing nothing; the risk(s) and benefit(s) of the alternative treatment(s) or procedure(s); and the risk(s) and benefit(s) of doing nothing. The patient was provided information about the general risks and possible complications associated with the procedure. These may include, but are not limited to: failure to achieve desired goals, infection, bleeding, organ or nerve damage, allergic reactions, paralysis, and death. In addition, the patient was informed of those risks and complications associated to Spine-related procedures, such as failure to decrease pain; infection (i.e.: Meningitis, epidural or intraspinal abscess); bleeding (i.e.: epidural hematoma, subarachnoid hemorrhage, or any other type of intraspinal or peri-dural bleeding); organ or nerve damage (i.e.: Any type of peripheral nerve, nerve root, or spinal cord injury) with subsequent damage to sensory, motor, and/or autonomic systems, resulting in permanent pain, numbness, and/or weakness of one or several areas of the body; allergic reactions; (i.e.: anaphylactic reaction); and/or  death. Furthermore, the patient was informed of those risks and complications associated with the medications. These include, but are not limited to: allergic reactions (i.e.: anaphylactic or anaphylactoid reaction(s)); adrenal axis suppression; blood sugar elevation that in diabetics may result in ketoacidosis or comma; water retention that in patients with history of congestive heart failure may result in shortness of breath, pulmonary edema, and decompensation with resultant heart failure; weight gain; swelling or edema; medication-induced neural toxicity; particulate matter embolism and blood vessel occlusion with resultant organ, and/or nervous system infarction; and/or aseptic necrosis of one or more joints. Finally, the patient was informed that Medicine is not an exact science; therefore, there is also the possibility of unforeseen or unpredictable risks and/or possible complications that may result in a catastrophic outcome. The patient indicated having understood very clearly. We have given the patient no guarantees and we have made no promises. Enough time was given to the patient to ask questions, all of which were answered to the patient's satisfaction. Ms. Hulette has indicated that she wanted to continue with the procedure. Attestation: I, the ordering provider, attest that I have discussed with the patient the benefits, risks, side-effects, alternatives, likelihood of achieving goals, and potential problems during recovery for the procedure that I have provided informed consent. Date  Time: 04/23/2023  9:23 AM   Pre-Procedure Preparation:  Monitoring: As per clinic protocol. Respiration, ETCO2, SpO2, BP, heart rate and rhythm monitor placed and checked for adequate function Safety Precautions: Patient was assessed for positional comfort and pressure points before starting the procedure. Time-out: I initiated and conducted the "Time-out" before starting the procedure, as per protocol. The  patient was asked to participate by confirming the accuracy of the "Time Out" information. Verification of the correct person, site, and procedure were performed and confirmed by me, the nursing staff, and the patient. "Time-out" conducted as per Joint Commission's Universal Protocol (UP.01.01.01). Time: 1013 Start Time: 1013 hrs.  Description of Procedure:          Laterality: (see above) Targeted Levels: (see above)  Safety Precautions: Aspiration looking for blood return was conducted prior to all injections. At no point did we inject any substances, as a needle was being advanced. Before injecting, the patient was told to immediately notify me if she was experiencing any new onset of "ringing in the ears, or metallic taste in the mouth". No attempts were made at seeking any paresthesias. Safe injection practices and needle disposal techniques used. Medications properly checked for expiration dates. SDV (single dose vial) medications used. After the completion of the procedure, all disposable equipment used was discarded in the proper designated medical waste containers. Local Anesthesia: Protocol guidelines were followed. The patient was positioned over the fluoroscopy table. The area was prepped in the usual manner. The time-out was completed. The target area was identified using fluoroscopy. A 12-in long, straight, sterile hemostat was used with fluoroscopic guidance to locate the targets for each level blocked. Once located, the skin was marked with an approved surgical skin marker. Once all sites were marked, the skin (epidermis, dermis, and hypodermis), as well as deeper tissues (fat, connective tissue and muscle) were infiltrated with a small amount of a short-acting local anesthetic, loaded on a 10cc syringe with a 25G, 1.5-in  Needle. An appropriate amount of time was allowed for local anesthetics to take effect before proceeding to the next step. Local Anesthetic: Lidocaine 2.0% The unused  portion of the local anesthetic was discarded in the proper designated containers. Technical description of process:  L2 Medial Branch Nerve Block (MBB): The target area for the L2 medial branch is at the junction of the postero-lateral aspect of the superior articular process and the superior, posterior, and medial edge of the transverse process of L3. Under fluoroscopic guidance, a Quincke needle was inserted until contact was made with os over the superior postero-lateral aspect of the pedicular shadow (target area). After negative aspiration for blood, 0.5 mL of the nerve block solution was injected without difficulty or complication. The needle was removed intact. L3 Medial Branch Nerve Block (MBB): The target area for the L3 medial branch is at the junction of the postero-lateral aspect of the superior articular process and the superior, posterior, and medial edge of the transverse process of L4. Under fluoroscopic guidance, a Quincke needle was inserted until contact was made with os over the superior postero-lateral aspect of the pedicular shadow (target area). After negative aspiration for blood, 0.5 mL of the nerve block solution was injected without difficulty or complication. The needle was removed intact. L4 Medial Branch Nerve Block (MBB): The target area for the L4 medial branch is at the junction of the postero-lateral aspect of the superior articular process and the superior, posterior, and medial edge of the transverse process of L5. Under fluoroscopic guidance, a Quincke needle was inserted until contact was made with os over the superior postero-lateral aspect of the pedicular shadow (target area). After negative aspiration for blood, 0.5 mL of the nerve block solution was injected without difficulty or complication. The needle was removed intact. L5 Medial Branch Nerve Block (MBB): The target area for the L5 medial branch is at the junction of the postero-lateral aspect of the superior  articular process and the superior, posterior, and medial edge of the sacral ala. Under fluoroscopic guidance, a Quincke needle was inserted until contact was made with os over the superior postero-lateral aspect of the pedicular shadow (target area).  After negative aspiration for blood, 0.5 mL of the nerve block solution was injected without difficulty or complication. The needle was removed intact. S1 Medial Branch Nerve Block (MBB): The target area for the S1 medial branch is at the posterior and inferior 6 o'clock position of the L5-S1 facet joint. Under fluoroscopic guidance, the Quincke needle inserted for the L5 MBB was redirected until contact was made with os over the inferior and postero aspect of the sacrum, at the 6 o' clock position under the L5-S1 facet joint (Target area). After negative aspiration for blood, 0.5 mL of the nerve block solution was injected without difficulty or complication. The needle was removed intact.  Once the entire procedure was completed, the treated area was cleaned, making sure to leave some of the prepping solution back to take advantage of its long term bactericidal properties.         Illustration of the posterior view of the lumbar spine and the posterior neural structures. Laminae of L2 through S1 are labeled. DPRL5, dorsal primary ramus of L5; DPRS1, dorsal primary ramus of S1; DPR3, dorsal primary ramus of L3; FJ, facet (zygapophyseal) joint L3-L4; I, inferior articular process of L4; LB1, lateral branch of dorsal primary ramus of L1; IAB, inferior articular branches from L3 medial branch (supplies L4-L5 facet joint); IBP, intermediate branch plexus; MB3, medial branch of dorsal primary ramus of L3; NR3, third lumbar nerve root; S, superior articular process of L5; SAB, superior articular branches from L4 (supplies L4-5 facet joint also); TP3, transverse process of L3.   Facet Joint Innervation (* possible contribution)  L1-2 T12, L1 (L2*)  Medial Branch   L2-3 L1, L2 (L3*)         "          "  L3-4 L2, L3 (L4*)         "          "  L4-5 L3, L4 (L5*)         "          "  L5-S1 L4, L5, S1          "          "    Vitals:   04/23/23 1021 04/23/23 1034 04/23/23 1043 04/23/23 1053  BP: (!) 145/101 (!) 153/76 138/84 134/69  Pulse:      Resp: 15 13 (!) 26 17  Temp:  (!) 97.3 F (36.3 C)  (!) 97.1 F (36.2 C)  TempSrc:  Temporal  Temporal  SpO2: 100% 100% 99% 98%  Weight:      Height:         End Time: 1021 hrs.  Imaging Guidance (Spinal):          Type of Imaging Technique: Fluoroscopy Guidance (Spinal) Indication(s): Assistance in needle guidance and placement for procedures requiring needle placement in or near specific anatomical locations not easily accessible without such assistance. Exposure Time: Please see nurses notes. Contrast: None used. Fluoroscopic Guidance: I was personally present during the use of fluoroscopy. "Tunnel Vision Technique" used to obtain the best possible view of the target area. Parallax error corrected before commencing the procedure. "Direction-depth-direction" technique used to introduce the needle under continuous pulsed fluoroscopy. Once target was reached, antero-posterior, oblique, and lateral fluoroscopic projection used confirm needle placement in all planes. Images permanently stored in EMR. Interpretation: No contrast injected. I personally interpreted the imaging intraoperatively. Adequate needle placement confirmed in multiple planes. Permanent images saved into the  patient's record.  Post-operative Assessment:  Post-procedure Vital Signs:  Pulse/HCG Rate: 7574 Temp: (!) 97.1 F (36.2 C) Resp: 17 BP: 134/69 SpO2: 98 %  EBL: None  Complications: No immediate post-treatment complications observed by team, or reported by patient.  Note: The patient tolerated the entire procedure well. A repeat set of vitals were taken after the procedure and the patient was kept under observation  following institutional policy, for this type of procedure. Post-procedural neurological assessment was performed, showing return to baseline, prior to discharge. The patient was provided with post-procedure discharge instructions, including a section on how to identify potential problems. Should any problems arise concerning this procedure, the patient was given instructions to immediately contact us, at any time, without hesitation. In any case, we plan to contact the patient by telephone for a follow-up status report regarding this interventional procedure.  Comments:  No additional relevant information.  Plan of Care (POC)  Orders:  Orders Placed This Encounter  Procedures   LUMBAR FACET(MEDIAL BRANCH NERVE BLOCK) MBNB    Scheduling Instructions:     Procedure: Lumbar facet block (AKA.: Lumbosacral medial branch nerve block)     Side: Bilateral     Level: L3-4, L4-5, L5-S1, and TBD Facets (L2, L3, L4, L5, S1, and TBD Medial Branch Nerves)     Sedation: Patient's choice.     Timeframe: Today    Order Specific Question:   Where will this procedure be performed?    Answer:   [facility]   DG [facility] C-ARM 1-60 MIN NO REPORT    Intraoperative interpretation by procedural physician at [facility].    Standing Status:   Standing    Number of Occurrences:   1    Order Specific Question:   Reason for exam:    Answer:   Assistance in needle guidance and placement for procedures requiring needle placement in or near specific anatomical locations not easily accessible without such assistance.   Follow-up    Schedule Ms. Verge for a post-procedure follow-up evaluation encounter 2 weeks from now.    Standing Status:   Future    Standing Expiration Date:   05/07/2023    Scheduling Instructions:     Schedule follow-up visit on afternoon of procedure day (T, Th)     Type: Face-to-face (F2F) Post-procedure (PP) evaluation (E/M)     When: 2 weeks from now   Informed Consent  Details: Physician/Practitioner Attestation; Transcribe to consent form and obtain patient signature    Nursing Order: Transcribe to consent form and obtain patient signature. Note: Always confirm laterality of pain with Ms. Ivery, before procedure.    Order Specific Question:   Physician/Practitioner attestation of informed consent for procedure/surgical case    Answer:   I, the physician/practitioner, attest that I have discussed with the patient the benefits, risks, side effects, alternatives, likelihood of achieving goals and potential problems during recovery for the procedure that I have provided informed consent.    Order Specific Question:   Procedure    Answer:   Lumbar Facet Block  under fluoroscopic guidance    Order Specific Question:   Physician/Practitioner performing the procedure    Answer:   Mellanie Bejarano A. Laban Emperor MD    Order Specific Question:   Indication/Reason    Answer:   Low Back Pain, with our without leg pain, due to Facet Joint Arthralgia (Joint Pain) Spondylosis (Arthritis of the Spine), without myelopathy or radiculopathy (Nerve Damage).   Provide equipment / supplies  at bedside    Procedure tray: "Block Tray" (Disposable  single use) Skin infiltration needle: Regular 1.5-in, 25-G, (x1) Block Needle type: Spinal Amount/quantity: 4 Size: Regular (3.5-inch) Gauge: 22G    Standing Status:   Standing    Number of Occurrences:   1    Order Specific Question:   Specify    Answer:   Block Tray   Chronic Opioid Analgesic:  None MME/day: 0 mg/day   Medications ordered for procedure: Meds ordered this encounter  Medications   lidocaine (XYLOCAINE) 2 % (with pres) injection 400 mg   pentafluoroprop-tetrafluoroeth (GEBAUERS) aerosol   lactated ringers infusion   midazolam (VERSED) 5 MG/5ML injection 0.5-2 mg    Make sure Flumazenil is available in the pyxis when using this medication. If oversedation occurs, administer 0.2 mg IV over 15 sec. If after 45 sec no  response, administer 0.2 mg again over 1 min; may repeat at 1 min intervals; not to exceed 4 doses (1 mg)   fentaNYL (SUBLIMAZE) injection 25-50 mcg    Make sure Narcan is available in the pyxis when using this medication. In the event of respiratory depression (RR< 8/min): Titrate NARCAN (naloxone) in increments of 0.1 to 0.2 mg IV at 2-3 minute intervals, until desired degree of reversal.   ropivacaine (PF) 2 mg/mL (0.2%) (NAROPIN) injection 18 mL   triamcinolone acetonide (KENALOG-40) injection 80 mg   Medications administered: We administered lidocaine, pentafluoroprop-tetrafluoroeth, lactated ringers, midazolam, fentaNYL, ropivacaine (PF) 2 mg/mL (0.2%), and triamcinolone acetonide.  See the medical record for exact dosing, route, and time of administration.  Follow-up plan:   Return for Proc-day (T,Th), (Face2F), (PPE).       Interventional Therapies  Risk Factors  Considerations:  GERD  HTN  Anxiety  Panic Attacks     Planned  Pending:   Therapeutic IM Toradol 60/Robaxin 200 mg (today 03/25/2023)  Today (03/25/2023) I have also recommended for the patient to take Prilosec OTC, turmeric, and to follow the IF (inflammatory factor) diet. Diagnostic bilateral lumbar facet MBB #1    Under consideration:   Pending   Completed:   None at this time   Therapeutic  Palliative (PRN) options:   None established   Completed by other providers:   Procedures by Chasnis.       Recent Visits Date Type Provider Dept  03/25/23 Office Visit Delano Metz, MD [facility]  03/04/23 Office Visit Delano Metz, MD [facility]  Showing recent visits within past 90 days and meeting all other requirements Today's Visits Date Type Provider Dept  04/23/23 Procedure visit Delano Metz, MD [facility]  Showing today's visits and meeting all other requirements Future Appointments Date Type Provider Dept  05/21/23 Appointment Delano Metz, MD [facility]  Showing future appointments within next 90 days and meeting all other requirements  Disposition: Discharge home  Discharge (Date  Time): 04/23/2023; 1059 hrs.   Primary Care Physician: Miki Kins, FNP Location: [facility] Note by: Oswaldo Done, MD (TTS technology used. I apologize for any typographical errors that were not detected and corrected.) Date: 04/23/2023; Time: 11:04 AM  Disclaimer:  Medicine is not an Visual merchandiser. The only guarantee in medicine is that nothing is guaranteed. It is important to note that the decision to proceed with this intervention was based on the information collected from the patient. The Data and conclusions were drawn from the patient's questionnaire, the interview, and the physical examination. Because the  information was provided in large part by the patient, it cannot be guaranteed that it has not been purposely or unconsciously manipulated. Every effort has been made to obtain as much relevant data as possible for this evaluation. It is important to note that the conclusions that lead to this procedure are derived in large part from the available data. Always take into account that the treatment will also be dependent on availability of resources and existing treatment guidelines, considered by other Pain Management Practitioners as being common knowledge and practice, at the time of the intervention. For Medico-Legal purposes, it is also important to point out that variation in procedural techniques and pharmacological choices are the acceptable norm. The indications, contraindications, technique, and results of the above procedure should only be interpreted and judged by a Board-Certified Interventional Pain Specialist with extensive familiarity and expertise in the same exact procedure and technique.

## 2023-04-23 NOTE — Progress Notes (Signed)
Safety precautions to be maintained throughout the outpatient stay will include: orient to surroundings, keep bed in low position, maintain call bell within reach at all times, provide assistance with transfer out of bed and ambulation.  

## 2023-04-24 ENCOUNTER — Telehealth: Payer: Self-pay | Admitting: *Deleted

## 2023-04-24 NOTE — Telephone Encounter (Signed)
No problems post procedure. 

## 2023-05-21 ENCOUNTER — Encounter: Payer: Self-pay | Admitting: Pain Medicine

## 2023-05-21 ENCOUNTER — Ambulatory Visit: Payer: BC Managed Care – PPO | Attending: Pain Medicine | Admitting: Pain Medicine

## 2023-05-21 VITALS — BP 146/62 | HR 83 | Temp 98.0°F | Resp 16 | Ht 60.0 in | Wt 170.0 lb

## 2023-05-21 DIAGNOSIS — M47816 Spondylosis without myelopathy or radiculopathy, lumbar region: Secondary | ICD-10-CM

## 2023-05-21 DIAGNOSIS — R937 Abnormal findings on diagnostic imaging of other parts of musculoskeletal system: Secondary | ICD-10-CM | POA: Diagnosis present

## 2023-05-21 DIAGNOSIS — Z96641 Presence of right artificial hip joint: Secondary | ICD-10-CM | POA: Diagnosis present

## 2023-05-21 DIAGNOSIS — M4316 Spondylolisthesis, lumbar region: Secondary | ICD-10-CM

## 2023-05-21 DIAGNOSIS — G8929 Other chronic pain: Secondary | ICD-10-CM

## 2023-05-21 DIAGNOSIS — M545 Low back pain, unspecified: Secondary | ICD-10-CM | POA: Diagnosis not present

## 2023-05-21 DIAGNOSIS — R1031 Right lower quadrant pain: Secondary | ICD-10-CM | POA: Diagnosis present

## 2023-05-21 DIAGNOSIS — M25551 Pain in right hip: Secondary | ICD-10-CM | POA: Insufficient documentation

## 2023-05-21 DIAGNOSIS — Z09 Encounter for follow-up examination after completed treatment for conditions other than malignant neoplasm: Secondary | ICD-10-CM | POA: Diagnosis present

## 2023-05-21 DIAGNOSIS — M4310 Spondylolisthesis, site unspecified: Secondary | ICD-10-CM

## 2023-05-21 DIAGNOSIS — M5459 Other low back pain: Secondary | ICD-10-CM

## 2023-05-21 DIAGNOSIS — M25552 Pain in left hip: Secondary | ICD-10-CM | POA: Diagnosis present

## 2023-05-21 NOTE — Progress Notes (Signed)
Safety precautions to be maintained throughout the outpatient stay will include: orient to surroundings, keep bed in low position, maintain call bell within reach at all times, provide assistance with transfer out of bed and ambulation.  

## 2023-05-21 NOTE — Progress Notes (Signed)
PROVIDER NOTE: Information contained herein reflects review and annotations entered in association with encounter. Interpretation of such information and data should be left to medically-trained personnel. Information provided to patient can be located elsewhere in the medical record under "Patient Instructions". Document created using STT-dictation technology, any transcriptional errors that may result from process are unintentional.    Patient: Evelyn Bentley  Service Category: E/M  Provider: Oswaldo Done, MD  DOB: 1956/10/23  DOS: 05/21/2023  Referring Provider: Miki Kins, FNP  MRN: [id number]  Specialty: Interventional Pain Management  PCP: Evelyn Kins, FNP  Type: Established Patient  Setting: Ambulatory outpatient    Location: Office  Delivery: Face-to-face     HPI  [name], a 67 y.o. year old female, is here today because of her Chronic bilateral low back pain without sciatica [M54.50, G89.29]. [name] primary complain today is Other (Right groin )  Pertinent problems: [name] has Fibromyalgia; Bursitis of hip; Chronic knee pain (5th area of Pain) (Bilateral) (R>L); Pain due to onychomycosis of toenails of both feet; Carpal tunnel syndrome (Bilateral); Neuropathy; Numbness and tingling; DDD (degenerative disc disease), lumbar; History of THR (total hip arthroplasty) (Right); Osteoarthritis of hands (Bilateral); Chronic pain syndrome; Abnormal MRI, lumbar spine (12/11/2022); Chronic low back pain (1ry area of Pain) (Bilateral) (R>L) w/o sciatica; Chronic hip pain (2ry area of Pain) (Bilateral) (R>L); Chronic hip pain after total replacement of hip joint (Right); Chronic neck pain (posterior) (3ry area of Pain) (Bilateral) (R>L); Cervicalgia; Chronic shoulder pain (4th area of Pain) (Bilateral) (R>L); Chronic hand pain (6th area of Pain) (Bilateral) (R=L); History of carpal tunnel release of wrists (Bilateral); Chronic feet pain (7th area of Pain) (Bilateral);  Charcot-Marie-Tooth disease (feet); Abnormal NCS (nerve conduction studies); Cubital tunnel syndrome (Bilateral); History of fusion of cervical spine (C4-C7); Cervical cord myelomalacia of (C5); Cervical spinal stenosis (C3-4); Cervical facet arthropathy; Cervical facet joint pain; 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, L4-5); DDD (degenerative disc disease), cervical; Lumbosacral facet arthropathy; 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); Osteoarthritis involving multiple joints; Abnormal MRI, cervical spine (05/10/2021); Osteoarthritis of hips (Bilateral); Osteoarthritis of knees (Bilateral); Osteoarthritis of feet (Bilateral); Osteoarthritis of shoulders (Bilateral); Osteoarthritis of glenohumeral joints (Bilateral); Osteoarthritis of acromioclavicular joints (Bilateral); Osteoarthritis of lumbar facet; Osteoarthritis of cervical facet; Osteoarthritis of cervical spine; Osteoarthritis of lumbar spine; Failed cervical fusion; Grade 1 Anterolisthesis of cervical spine (C2/C3) (2 mm); Grade 1 Retrolisthesis of L1/L2 (1-2 mm); Lumbar facet arthropathy (Bilateral); Lumbar facet syndrome; Cervical facet syndrome; Spondylosis without myelopathy or radiculopathy, lumbosacral region; Lumbar facet joint pain; and Chronic groin pain (Right) on their pertinent problem list. Pain Assessment: Severity of Chronic pain is reported as a 0-No pain/10. Location: Groin Right/denies. Onset: More than a month ago. Quality: Discomfort, Sharp. Timing: Intermittent. Modifying factor(s): does not really do anything for the pain, is very intermittent and does not last very long.. Vitals:  height is 5' (1.524 m) and weight is 170 lb (77.1 kg). Her temporal temperature is 98 F (36.7 C). Her blood pressure is 146/62 (abnormal) and her pulse is 83. Her respiration is 16 and oxygen saturation is 100%.  BMI: Estimated body mass index is 33.2 kg/m as calculated from the following:   Height as of this encounter: 5' (1.524 m).    Weight as of this encounter: 170 lb (77.1 kg). Last encounter: 03/25/2023. Last procedure: 04/23/2023.  Reason for encounter: post-procedure evaluation and assessment.  The patient indicated having attained 100% relief of the pain for  the duration of the local anesthetic followed by an ongoing 90% improvement of her low back and hip pain.  She refers that currently the only thing that she has is an intermittent right groin pain which she describes as not being very significant at this point.  I have informed the patient that should she want me to further work this up, all she needs to do is let me know.  For now, since she is doing well, we are now planning on doing anything else.  I will see her on a as needed basis.  Post-procedure evaluation   Procedure: Lumbar Facet, Medial Branch Block(s) #1  Laterality: Bilateral  Level: L2, L3, L4, L5, and S1 Medial Branch Level(s). Injecting these levels blocks the L3-4, L4-5, and L5-S1 lumbar facet joints. Imaging: Fluoroscopic guidance         Anesthesia: Local anesthesia (1-2% Lidocaine) Anxiolysis: IV Versed         Sedation: Moderate Sedation                       DOS: 04/23/2023 Performed by: Evelyn Done, MD  Primary Purpose: Diagnostic/Therapeutic Indications: Low back pain severe enough to impact quality of life or function. 1. Lumbar facet syndrome   2. Lumbosacral facet arthropathy   3. Lumbar facet arthropathy (Bilateral)   4. Spondylosis without myelopathy or radiculopathy, lumbosacral region   5. Lumbar facet joint pain   6. 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 Grade 1 Retrolisthesis of L1/L2 (1-2 mm)   8. Chronic low back pain (1ry area of Pain) (Bilateral) (R>L) w/o sciatica   9. Osteoarthritis of lumbar facet   10. Osteoarthritis of lumbar spine    NAS-11 Pain score:   Pre-procedure: 3 /10   Post-procedure: 0-No pain/10      Effectiveness:  Initial hour after procedure: 100 %. Subsequent 4-6  hours post-procedure: 100 %. Analgesia past initial 6 hours: 90 % (lower back has been doing really well.  Is having pain in the right groin.). Ongoing improvement:  Analgesic: The patient indicates having an ongoing 90% improvement of the lower back pain. Function: [name] reports improvement in function ROM: [name] reports improvement in ROM  Pharmacotherapy Assessment  Analgesic: None MME/day: 0 mg/day   Monitoring: [place] PMP: PDMP reviewed during this encounter.       Pharmacotherapy: No side-effects or adverse reactions reported. Compliance: No problems identified. Effectiveness: Clinically acceptable.  Vernie Ammons, RN  05/21/2023  1:55 PM  Sign when Signing Visit Safety precautions to be maintained throughout the outpatient stay will include: orient to surroundings, keep bed in low position, maintain call bell within reach at all times, provide assistance with transfer out of bed and ambulation.     No results found for: "CBDTHCR" No results found for: "D8THCCBX" No results found for: "D9THCCBX"  UDS:  Summary  Date Value Ref Range Status  03/04/2023 Note  Final    Comment:    ==================================================================== Compliance Drug Analysis, Ur ==================================================================== Test                             Result       Flag       Units  Drug Present and Declared for Prescription Verification   Acetaminophen                  PRESENT  EXPECTED  Drug Present not Declared for Prescription Verification   Carboxy-THC                    516          UNEXPECTED ng/mg creat    Carboxy-THC is a metabolite of tetrahydrocannabinol (THC). Source of    THC is most commonly herbal marijuana or marijuana-based products,    but THC is also present in a scheduled prescription medication.    Trace amounts of THC can be present in hemp and cannabidiol (CBD)    products. This test is not intended to  distinguish between delta-9-    tetrahydrocannabinol, the predominant form of THC in most herbal or    marijuana-based products, and delta-8-tetrahydrocannabinol.    Dextrorphan/Levorphanol        PRESENT      UNEXPECTED    Dextrorphan is an expected metabolite of dextromethorphan, an over-    the-counter or prescription cough suppressant. Levorphanol is a    scheduled prescription medication. Dextrorphan cannot be    distinguished from levorphanol by the method used for analysis.  Drug Absent but Declared for Prescription Verification   Chlorpheniramine               Not Detected UNEXPECTED ==================================================================== Test                      Result    Flag   Units      Ref Range   Creatinine              165              mg/dL      >=40 ==================================================================== Declared Medications:  The flagging and interpretation on this report are based on the  following declared medications.  Unexpected results may arise from  inaccuracies in the declared medications.   **Note: The testing scope of this panel includes these medications:   Chlorpheniramine   **Note: The testing scope of this panel does not include small to  moderate amounts of these reported medications:   Acetaminophen (Tylenol)   **Note: The testing scope of this panel does not include the  following reported medications:   Azelastine (Astelin)  Celecoxib (Celebrex)  Hydrochlorothiazide (Zestoretic)  Levothyroxine (Synthroid)  Lisinopril (Zestoretic)  Polyethylene Glycol (MiraLAX)  Rosuvastatin (Crestor)  Vitamin B12  Vitamin D2 (Drisdol) ==================================================================== For clinical consultation, please call [phone]. ====================================================================       ROS  Constitutional: Denies any fever or chills Gastrointestinal: No reported hemesis,  hematochezia, vomiting, or acute GI distress Musculoskeletal: Denies any acute onset joint swelling, redness, loss of ROM, or weakness Neurological: No reported episodes of acute onset apraxia, aphasia, dysarthria, agnosia, amnesia, paralysis, loss of coordination, or loss of consciousness  Medication Review  HYDROcodone-acetaminophen, acetaminophen, azelastine, celecoxib, chlorpheniramine, levothyroxine, lisinopril-hydrochlorothiazide, naloxone, polyethylene glycol, and rosuvastatin  History Review  Allergy: [name] is allergic to codeine. Drug: [name]  reports no history of drug use. Alcohol:  reports that she does not currently use alcohol. Tobacco:  reports that she quit smoking about 9 years ago. Her smoking use included cigarettes. She started smoking about 48 years ago. She has never used smokeless tobacco. Social: [name]  reports that she quit smoking about 9 years ago. Her smoking use included cigarettes. She started smoking about 48 years ago. She has never used smokeless tobacco. She reports that she does not currently use alcohol. She reports that she  does not use drugs. Medical:  has a past medical history of Anxiety, Arthritis, Bursitis, Closed fracture of patella (05/06/2017), Complication of anesthesia, Depression, Family history of adverse reaction to anesthesia, Fibromyalgia, GERD (gastroesophageal reflux disease), H/O arthritis (03/06/2015), H/O: hypothyroidism (03/06/2015), History of 2019 novel coronavirus disease (COVID-19) (12/20/2020), History of kidney stones, History of migraine headaches (05/02/2015), Hypercholesteremia, Hypertension, Hypothyroidism, Knee pain (05/06/2017), PONV (postoperative nausea and vomiting), Pre-diabetes, Sepsis due to urinary tract infection (HCC) (07/02/2016), Sleep apnea, Status post total hip replacement, 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 Surgical: [name]  has a past surgical history that  includes Cholecystectomy; Neck surgery; Nissen fundoplication; Tubal ligation; Laparoscopic hysterectomy (Bilateral, 02/28/2016); Ureteroscopy with holmium laser lithotripsy (Left, 07/03/2016); Colonoscopy with propofol (N/A, 11/13/2016); Esophagogastroduodenoscopy (egd) with propofol (N/A, 11/13/2016); Abdominal hysterectomy; Esophagogastroduodenoscopy (egd) with propofol (N/A, 12/16/2018); Carpal tunnel release (Right, 09/01/2020); Cataract extraction, bilateral; Carpal tunnel release (Left, 01/05/2021); Anterior interosseous nerve decompression (Left, 01/05/2021); fundoflication (2015); and Total hip arthroplasty (Right, 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 Family: family history includes Anxiety disorder in her mother; Depression in her mother.  Laboratory Chemistry Profile   Renal Lab Results  Component Value Date   BUN 13 04/05/2023   CREATININE 0.90 04/05/2023   BCR 14 04/05/2023   GFRAA >60 07/14/2016   GFRNONAA >60 01/21/2022    Hepatic Lab Results  Component Value Date   AST 21 04/05/2023   ALT 15 04/05/2023   ALBUMIN 4.5 04/05/2023   ALKPHOS 88 04/05/2023    Electrolytes Lab Results  Component Value Date   NA 142 04/05/2023   K 4.3 04/05/2023   CL 104 04/05/2023   CALCIUM 9.6 04/05/2023   MG 2.3 03/04/2023    Bone Lab Results  Component Value Date   VD25OH 57.7 04/05/2023    Inflammation (CRP: Acute Phase) (ESR: Chronic Phase) Lab Results  Component Value Date   CRP <1 01/11/2023   ESRSEDRATE 2 01/11/2023   LATICACIDVEN 0.6 07/02/2016         Note: Above Lab results reviewed.  Recent Imaging Review  DG PAIN CLINIC C-ARM 1-60 MIN NO REPORT Fluoro was used, but no Radiologist interpretation will be provided.  Please refer to "NOTES" tab for provider progress note. Note: Reviewed        Physical Exam  General appearance: Well nourished, well developed, and well hydrated. In no apparent acute distress Mental status: Alert, oriented x 3 (person, place, & time)       Respiratory: No  evidence of acute respiratory distress Eyes: PERLA Vitals: BP (!) 146/62 (BP Location: Right Arm, Patient Position: Sitting, Cuff Size: Normal)   Pulse 83   Temp 98 F (36.7 C) (Temporal)   Resp 16   Ht 5' (1.524 m)   Wt 170 lb (77.1 kg)   SpO2 100%   BMI 33.20 kg/m  BMI: Estimated body mass index is 33.2 kg/m as calculated from the following:   Height as of this encounter: 5' (1.524 m).   Weight as of this encounter: 170 lb (77.1 kg). Ideal: Ideal body weight: 45.5 kg (100 lb 4.9 oz) Adjusted ideal body weight: 58.1 kg (128 lb 3 oz)  Assessment   Diagnosis Status  1. Chronic low back pain (1ry area of Pain) (Bilateral) (R>L) w/o sciatica   2. Lumbar facet joint pain   3. Lumbar facet syndrome   4. Postop check   5. Chronic groin pain (Right)   6. Chronic hip pain after total replacement of hip joint (Right)   7. Chronic  hip pain (2ry area of Pain) (Bilateral) (R>L)   8. 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 Grade 1 Retrolisthesis of L1/L2 (1-2 mm)   10. History of THR (total hip arthroplasty) (Right)   11. Abnormal MRI, lumbar spine (12/11/2022)    Controlled Controlled Controlled   Updated Problems: Problem  Chronic groin pain (Right)    Plan of Care  Problem-specific:  No problem-specific Assessment & Plan notes found for this encounter.  [name] has a current medication list which includes the following long-term medication(s): azelastine, chlorpheniramine, levothyroxine, and lisinopril-hydrochlorothiazide.  Pharmacotherapy (Medications Ordered): No orders of the defined types were placed in this encounter.  Orders:  Orders Placed This Encounter  Procedures   Nursing Instructions:    Please complete this patient's postprocedure evaluation.    Scheduling Instructions:     Please complete this patient's postprocedure evaluation.   Follow-up plan:   Return if symptoms worsen or fail to improve.      Interventional  Therapies  Risk Factors  Considerations:  GERD  HTN  Anxiety  Panic Attacks     Planned  Pending:   Therapeutic IM Toradol 60/Robaxin 200 mg (today 03/25/2023)  Today (03/25/2023) I have also recommended for the patient to take Prilosec OTC, turmeric, and to follow the IF (inflammatory factor) diet. Diagnostic bilateral lumbar facet MBB #1    Under consideration:   Pending   Completed:   None at this time   Therapeutic  Palliative (PRN) options:   None established   Completed by other providers:   Procedures by Chas[facility].        Recent Visits Date Type Provider Dept  04/23/23 Procedure visit Delano Metz, MD [facility]  03/25/23 Office Visit Delano Metz, MD [facility]  03/04/23 Office Visit Delano Metz, MD [facility]  Showing recent visits within past 90 days and meeting all other requirements Today's Visits Date Type Provider Dept  05/21/23 Office Visit Delano Metz, MD [facility]  Showing today's visits and meeting all other requirements Future Appointments No visits were found meeting these conditions. Showing future appointments within next 90 days and meeting all other requirements  I discussed the assessment and treatment plan with the patient. The patient was provided an opportunity to ask questions and all were answered. The patient agreed with the plan and demonstrated an understanding of the instructions.  Patient advised to call back or seek an in-person evaluation if the symptoms or condition worsens.  Duration of encounter: 30 minutes.  Total time on encounter, as per AMA guidelines included both the face-to-face and non-face-to-face time personally spent by the physician and/or other qualified health care professional(s) on the day of the encounter (includes time in activities that require the physician or other qualified health care professional and does not include time in activities  normally performed by clinical staff). Physician's time may include the following activities when performed: Preparing to see the patient (e.g., pre-charting review of records, searching for previously ordered imaging, lab work, and nerve conduction tests) Review of prior analgesic pharmacotherapies. Reviewing PMP Interpreting ordered tests (e.g., lab work, imaging, nerve conduction tests) Performing post-procedure evaluations, including interpretation of diagnostic procedures Obtaining and/or reviewing separately obtained history Performing a medically appropriate examination and/or evaluation Counseling and educating the patient/family/caregiver Ordering medications, tests, or procedures Referring and communicating with other health care professionals (when not separately reported) Documenting clinical information in the electronic or other health record Independently interpreting  results (not separately reported) and communicating results to the patient/ family/caregiver Care coordination (not separately reported)  Note by: Evelyn Done, MD Date: 05/21/2023; Time: 2:37 PM

## 2023-05-21 NOTE — Patient Instructions (Signed)
  ____________________________________________________________________________________________  Muscle Spasms & Cramps  Cause(s):  Most common - vitamin and/or electrolyte (calcium, potassium, sodium, etc.) deficiencies. Post procedure - steroids can make your kidneys excrete electrolytes. If you happen to have been borderline low on your electrolytes, it may temporarily triggering cramps & spasms.  Possible triggers: Sweating - causes loss of electrolytes thru the skin. Steroids - causes loss of electrolytes thru the urine.  Treatment: Gatorade (or any other electrolyte-replenishing drink) - Take 1, 8 oz glass with each meal (3 times a day). OTC (over-the-counter) Magnesium 400 to 500 mg - Take 1 tablet twice a day (one with breakfast and one before bedtime). If you have kidney problems, talk to your primary care physician before taking any Magnesium. Tonic Water with quinine - Take 1, 8 oz glass before bedtime.   ____________________________________________________________________________________________    ____________________________________________________________________________________________  Pain Prevention Technique  Definition:   A technique used to minimize the effects of an activity known to cause inflammation or swelling, which in turn leads to an increase in pain.  Purpose: To prevent swelling from occurring. It is based on the fact that it is easier to prevent swelling from happening than it is to get rid of it, once it occurs.  Contraindications: Anyone with allergy or hypersensitivity to the recommended medications. Anyone taking anticoagulants (Blood Thinners) (e.g., Coumadin, Warfarin, Plavix, etc.). Patients in Renal Failure.  Technique: Before you undertake an activity known to cause pain, or a flare-up of your chronic pain, and before you experience any pain, do the following:  On a full stomach, take 4 (four) over the counter Ibuprofens 200mg  tablets  (Motrin), for a total of 800 mg. In addition, take over the counter Magnesium 400 to 500 mg, before doing the activity.  Six (6) hours later, again on a full stomach, repeat the Ibuprofen. That night, take a warm shower and stretch under the running warm water.  This technique may be sufficient to abort the pain and discomfort before it happens. Keep in mind that it takes a lot less medication to prevent swelling than it takes to eliminate it once it occurs.  ____________________________________________________________________________________________

## 2023-06-11 ENCOUNTER — Other Ambulatory Visit: Payer: Self-pay | Admitting: Pain Medicine

## 2023-06-11 DIAGNOSIS — E559 Vitamin D deficiency, unspecified: Secondary | ICD-10-CM

## 2023-06-25 ENCOUNTER — Ambulatory Visit: Payer: BC Managed Care – PPO | Admitting: Family

## 2023-08-23 DIAGNOSIS — Z23 Encounter for immunization: Secondary | ICD-10-CM | POA: Diagnosis not present

## 2023-08-23 DIAGNOSIS — Z0000 Encounter for general adult medical examination without abnormal findings: Secondary | ICD-10-CM | POA: Diagnosis not present

## 2023-08-23 DIAGNOSIS — R110 Nausea: Secondary | ICD-10-CM | POA: Diagnosis not present

## 2023-08-23 DIAGNOSIS — G8929 Other chronic pain: Secondary | ICD-10-CM | POA: Diagnosis not present

## 2023-08-23 DIAGNOSIS — M5416 Radiculopathy, lumbar region: Secondary | ICD-10-CM | POA: Diagnosis not present

## 2023-08-23 DIAGNOSIS — Z79899 Other long term (current) drug therapy: Secondary | ICD-10-CM | POA: Diagnosis not present

## 2023-08-23 DIAGNOSIS — Z6833 Body mass index (BMI) 33.0-33.9, adult: Secondary | ICD-10-CM | POA: Diagnosis not present

## 2023-08-27 DIAGNOSIS — Z79899 Other long term (current) drug therapy: Secondary | ICD-10-CM | POA: Diagnosis not present

## 2023-08-27 NOTE — Progress Notes (Unsigned)
PROVIDER NOTE: Information contained herein reflects review and annotations entered in association with encounter. Interpretation of such information and data should be left to medically-trained personnel. Information provided to patient can be located elsewhere in the medical record under "Patient Instructions". Document created using STT-dictation technology, any transcriptional errors that may result from process are unintentional.    Patient: Evelyn Bentley  Service Category: E/M  Provider: Oswaldo Done, MD  DOB: 1956-09-07  DOS: 08/28/2023  Referring Provider: Miki Kins, FNP  MRN: [id number]  Specialty: Interventional Pain Management  PCP: Miki Kins, FNP  Type: Established Patient  Setting: Ambulatory outpatient    Location: Office  Delivery: Face-to-face     HPI  [name], a 67 y.o. year old female, is here today because of her Chronic pain of lower extremity, bilateral [M79.604, M79.605, G89.29]. [name] primary complain today is Hip Pain (Bilateral hips and knees)  Pertinent problems: Ms. Chaudry has Fibromyalgia; Bursitis of hip; Chronic knee pain (5th area of Pain) (Bilateral) (R>L); Pain due to onychomycosis of toenails of both feet; Carpal tunnel syndrome (Bilateral); Neuropathy; Numbness and tingling; DDD (degenerative disc disease), lumbar; History of THR (total hip arthroplasty) (Right); Osteoarthritis of hands (Bilateral); Chronic pain syndrome; Abnormal MRI, lumbar spine (12/11/2022); Chronic low back pain (1ry area of Pain) (Bilateral) (R>L) w/o sciatica; Chronic hip pain (2ry area of Pain) (Bilateral) (R>L); Chronic hip pain after total replacement of hip joint (Right); Chronic neck pain (posterior) (3ry area of Pain) (Bilateral) (R>L); Cervicalgia; Chronic shoulder pain (4th area of Pain) (Bilateral) (R>L); Chronic hand pain (6th area of Pain) (Bilateral) (R=L); History of carpal tunnel release of wrists (Bilateral); Chronic feet pain (7th area  of Pain) (Bilateral); Charcot-Marie-Tooth disease (feet); Abnormal NCS (nerve conduction studies); Cubital tunnel syndrome (Bilateral); History of fusion of cervical spine (C4-C7); Cervical cord myelomalacia of (C5); Cervical spinal stenosis (C3-4); Cervical facet arthropathy; Cervical facet joint pain; 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, L4-5); DDD (degenerative disc disease), cervical; Lumbosacral facet arthropathy; 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); Osteoarthritis involving multiple joints; Abnormal MRI, cervical spine (05/10/2021); Osteoarthritis of hips (Bilateral); Osteoarthritis of knees (Bilateral); Osteoarthritis of feet (Bilateral); Osteoarthritis of shoulders (Bilateral); Osteoarthritis of glenohumeral joints (Bilateral); Osteoarthritis of acromioclavicular joints (Bilateral); Osteoarthritis of lumbar facet; Osteoarthritis of cervical facet; Osteoarthritis of cervical spine; Osteoarthritis of lumbar spine; Failed cervical fusion; Grade 1 Anterolisthesis of cervical spine (C2/C3) (2 mm); Grade 1 Retrolisthesis of L1/L2 (1-2 mm); Lumbar facet arthropathy (Bilateral); Lumbar facet syndrome; Cervical facet syndrome; Spondylosis without myelopathy or radiculopathy, lumbosacral region; Lumbar facet joint pain; Chronic groin pain (Right); Chronic lower extremity pain (Bilateral); Osteoarthritis of hip (femoroacetabular OA) (Left); Chronic hip pain (Left); Polyneuropathy, peripheral sensorimotor axonal; and Right lumbar radiculitis on their pertinent problem list. Pain Assessment: Severity of Chronic pain is reported as a 2 /10. Location: Hip Right, Left (and bilateral knee)/denies. Onset: More than a month ago. Quality: Constant, Sharp, Aching, Dull. Timing: Constant. Modifying factor(s): meds, rest. Vitals:  height is 5' (1.524 m) and weight is 172 lb (78 kg). Her temporal temperature is 97 F (36.1 C) (abnormal). Her blood pressure is 151/90 (abnormal) and her pulse is 75. Her oxygen  saturation is 100%.  BMI: Estimated body mass index is 33.59 kg/m as calculated from the following:   Height as of this encounter: 5' (1.524 m).   Weight as of this encounter: 172 lb (78 kg). Last encounter: 05/21/2023. Last procedure: 04/23/2023.  Reason for encounter: evaluation of worsening, or  previously known (established) problem.  The patient requested the visit to discuss the bilateral lower extremity and hip pain.  The patient has a history of a right total hip replacement with no evidence of hardware failure.  Recent x-rays of both hips done on 03/06/2023 indicate the patient to have a left femoroacetabular osteoarthritis.  Unfortunately, determine the etiology of this pain is challenging since clinically it would appear to be multifactorial.  Not only does the pain have evidence of hip joint arthropathies that required a right hip replacement, but the patient also has degenerative disc disease in the lumbar and cervical spine with evidence of multilevel spondylolisthesis capable of triggering pain in the above-mentioned areas.  She has evidence of cervical cord myelomalacia at C5 as well as charcoaled Marie tooth disease affecting both feet.  Today she comes in and indicates that her primary area of pain is that of the hips (Bilateral) (R>L).  She does have a history of a right total hip replacement and therefore it is unlikely that the pain is actually coming from the hip joint itself.  She describes the pain as being sharp and in the groin area.  She also indicates the pain to be primarily present at night.  This would suggest a positional component to this pain.  She also indicates having that her secondary area pain today is that of the knees (Bilateral) (R=L).  Today I have reviewed the patient's recent MRI of the lumbar spine and it would suggest that this pain may be coming from the lumbar region.  The patient was able to toe walk and heel walk today without any problems.  Hyperextension and  rotation did not really reproduce the pain.  Lateral bending of the lumbar spine did seem to cause some discomfort suggesting foraminal stenosis.  Today the patient has confirmed that since her initial interventional procedure (bilateral lumbar facet block) her low back pain has been under control and the pain that she is currently experiencing is more in the area of the hips and groin, particularly the right groin.  In reviewing the results from her prior bilateral lumbar facet block, I have confirmed that she indicated that the procedure provided her with 100% relief of the low back pain, but the pain in the right groin area did not seem to be improved significantly from the facet block suggesting the etiology to be elsewhere.  She does have some spondylolisthesis at L3/L4 and L4/L5.  In addition, she also seems to have a retrolisthesis at L1/L2.  Because she indicates the worst pain to be that of the groin area, and traveling over her anterior thigh, it is very likely that we are dealing with problems coming from the upper lumbar region.  For this reason I will be scheduling the patient to return for a right-sided L3-4 LESI # 1 under fluoroscopic guidance.  The patient has denied taking any type of blood thinners.  She also denies allergies to any of the medications used for the epidural.  The plan was shared with the patient who understood and accepted.  Pharmacotherapy Assessment  Analgesic: No chronic opioid analgesics therapy prescribed by our practice. None MME/day: 0 mg/day   Monitoring: [place] PMP: PDMP reviewed during this encounter.       Pharmacotherapy: No side-effects or adverse reactions reported. Compliance: No problems identified. Effectiveness: Clinically acceptable.  Florina Ou, RN  08/28/2023  8:43 AM  Sign when Signing Visit Safety precautions to be maintained throughout the outpatient stay will  include: orient to surroundings, keep bed in low position, maintain call bell within  reach at all times, provide assistance with transfer out of bed and ambulation.     No results found for: "CBDTHCR" No results found for: "D8THCCBX" No results found for: "D9THCCBX"  UDS:  Summary  Date Value Ref Range Status  03/04/2023 Note  Final    Comment:    ==================================================================== Compliance Drug Analysis, Ur ==================================================================== Test                             Result       Flag       Units  Drug Present and Declared for Prescription Verification   Acetaminophen                  PRESENT      EXPECTED  Drug Present not Declared for Prescription Verification   Carboxy-THC                    516          UNEXPECTED ng/mg creat    Carboxy-THC is a metabolite of tetrahydrocannabinol (THC). Source of    THC is most commonly herbal marijuana or marijuana-based products,    but THC is also present in a scheduled prescription medication.    Trace amounts of THC can be present in hemp and cannabidiol (CBD)    products. This test is not intended to distinguish between delta-9-    tetrahydrocannabinol, the predominant form of THC in most herbal or    marijuana-based products, and delta-8-tetrahydrocannabinol.    Dextrorphan/Levorphanol        PRESENT      UNEXPECTED    Dextrorphan is an expected metabolite of dextromethorphan, an over-    the-counter or prescription cough suppressant. Levorphanol is a    scheduled prescription medication. Dextrorphan cannot be    distinguished from levorphanol by the method used for analysis.  Drug Absent but Declared for Prescription Verification   Chlorpheniramine               Not Detected UNEXPECTED ==================================================================== Test                      Result    Flag   Units      Ref Range   Creatinine              165              mg/dL       >=69 ==================================================================== Declared Medications:  The flagging and interpretation on this report are based on the  following declared medications.  Unexpected results may arise from  inaccuracies in the declared medications.   **Note: The testing scope of this panel includes these medications:   Chlorpheniramine   **Note: The testing scope of this panel does not include small to  moderate amounts of these reported medications:   Acetaminophen (Tylenol)   **Note: The testing scope of this panel does not include the  following reported medications:   Azelastine (Astelin)  Celecoxib (Celebrex)  Hydrochlorothiazide (Zestoretic)  Levothyroxine (Synthroid)  Lisinopril (Zestoretic)  Polyethylene Glycol (MiraLAX)  Rosuvastatin (Crestor)  Vitamin B12  Vitamin D2 (Drisdol) ==================================================================== For clinical consultation, please call [phone]. ====================================================================       ROS  Constitutional: Denies any fever or chills Gastrointestinal: No reported hemesis, hematochezia, vomiting, or acute GI distress Musculoskeletal: Denies  any acute onset joint swelling, redness, loss of ROM, or weakness Neurological: No reported episodes of acute onset apraxia, aphasia, dysarthria, agnosia, amnesia, paralysis, loss of coordination, or loss of consciousness  Medication Review  HYDROcodone-acetaminophen, acetaminophen, azelastine, celecoxib, chlorpheniramine, gabapentin, levothyroxine, lisinopril-hydrochlorothiazide, naloxone, ondansetron, oxyCODONE-acetaminophen, polyethylene glycol, and rosuvastatin  History Review  Allergy: Ms. Agate is allergic to codeine. Drug: Ms. Doetsch  reports no history of drug use. Alcohol:  reports that she does not currently use alcohol. Tobacco:  reports that she quit smoking about 9 years ago. Her smoking use included  cigarettes. She started smoking about 48 years ago. She has never used smokeless tobacco. Social: Ms. Commander  reports that she quit smoking about 9 years ago. Her smoking use included cigarettes. She started smoking about 48 years ago. She has never used smokeless tobacco. She reports that she does not currently use alcohol. She reports that she does not use drugs. Medical:  has a past medical history of Anxiety, Arthritis, Bursitis, Closed fracture of patella (05/06/2017), Complication of anesthesia, Depression, Family history of adverse reaction to anesthesia, Fibromyalgia, GERD (gastroesophageal reflux disease), H/O arthritis (03/06/2015), H/O: hypothyroidism (03/06/2015), History of 2019 novel coronavirus disease (COVID-19) (12/20/2020), History of kidney stones, History of migraine headaches (05/02/2015), Hypercholesteremia, Hypertension, Hypothyroidism, Knee pain (05/06/2017), PONV (postoperative nausea and vomiting), Pre-diabetes, Sepsis due to urinary tract infection (HCC) (07/02/2016), Sleep apnea, Status post total hip replacement, 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 Surgical: Ms. Cory  has a past surgical history that includes Cholecystectomy; Neck surgery; Nissen fundoplication; Tubal ligation; Laparoscopic hysterectomy (Bilateral, 02/28/2016); Ureteroscopy with holmium laser lithotripsy (Left, 07/03/2016); Colonoscopy with propofol (N/A, 11/13/2016); Esophagogastroduodenoscopy (egd) with propofol (N/A, 11/13/2016); Abdominal hysterectomy; Esophagogastroduodenoscopy (egd) with propofol (N/A, 12/16/2018); Carpal tunnel release (Right, 09/01/2020); Cataract extraction, bilateral; Carpal tunnel release (Left, 01/05/2021); Anterior interosseous nerve decompression (Left, 01/05/2021); fundoflication (2015); and Total hip arthroplasty (Right, 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 Family: family history includes Anxiety disorder in her mother; Depression in her mother.  Laboratory Chemistry  Profile   Renal Lab Results  Component Value Date   BUN 13 04/05/2023   CREATININE 0.90 04/05/2023   BCR 14 04/05/2023   GFRAA >60 07/14/2016   GFRNONAA >60 01/21/2022    Hepatic Lab Results  Component Value Date   AST 21 04/05/2023   ALT 15 04/05/2023   ALBUMIN 4.5 04/05/2023   ALKPHOS 88 04/05/2023    Electrolytes Lab Results  Component Value Date   NA 142 04/05/2023   K 4.3 04/05/2023   CL 104 04/05/2023   CALCIUM 9.6 04/05/2023   MG 2.3 03/04/2023    Bone Lab Results  Component Value Date   VD25OH 57.7 04/05/2023    Inflammation (CRP: Acute Phase) (ESR: Chronic Phase) Lab Results  Component Value Date   CRP <1 01/11/2023   ESRSEDRATE 2 01/11/2023   LATICACIDVEN 0.6 07/02/2016         Note: Above Lab results reviewed.  Recent Imaging Review  DG PAIN CLIN[facility] C-ARM 1-60 MIN NO REPORT Fluoro was used, but no Radiologist interpretation will be provided.  Please refer to "NOTES" tab for provider progress note. Note: Reviewed        Physical Exam  General appearance: Well nourished, well developed, and well hydrated. In no apparent acute distress Mental status: Alert, oriented x 3 (person, place, & time)       Respiratory: No evidence of acute respiratory distress Eyes: PERLA Vitals: BP (!) 151/90 (BP Location: Right Arm, Patient Position: Sitting, Cuff Size: Large)  Pulse 75   Temp (!) 97 F (36.1 C) (Temporal)   Ht 5' (1.524 m)   Wt 172 lb (78 kg)   SpO2 100%   BMI 33.59 kg/m  BMI: Estimated body mass index is 33.59 kg/m as calculated from the following:   Height as of this encounter: 5' (1.524 m).   Weight as of this encounter: 172 lb (78 kg). Ideal: Ideal body weight: 45.5 kg (100 lb 4.9 oz) Adjusted ideal body weight: 58.5 kg (128 lb 15.8 oz)  Assessment   Diagnosis Status  1. Chronic lower extremity pain (Bilateral)   2. Chronic hip pain (Left)   3. Osteoarthritis of hip (femoroacetabular OA) (Left)   4. Chronic hip pain (2ry area of Pain)  (Bilateral) (R>L)   5. Chronic hip pain after total replacement of hip joint (Right)   6. History of THR (total hip arthroplasty) (Right)   7. 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, L4-5)   8. Grade 1 Retrolisthesis of L1/L2 (1-2 mm)   9. Degeneration of intervertebral disc of lumbar region with lower extremity pain   10. Lumbar facet joint pain   11. 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 Abnormal MRI, lumbar spine (12/11/2022)   13. Abnormal NCS (nerve conduction studies)   14. Polyneuropathy, peripheral sensorimotor axonal   15. Abnormal MRI, cervical spine (05/10/2021)   16. Right lumbar radiculitis   17. Chronic groin pain (Right)    Controlled Controlled Controlled   Updated Problems: Problem  Chronic lower extremity pain (Bilateral)  Osteoarthritis of hip (femoroacetabular OA) (Left)  Chronic hip pain (Left)  Polyneuropathy, Peripheral Sensorimotor Axonal   Upper extremity EMG IMPRESSION: Abnormal study. There is electrodianogstic evidence consistent with the patient's history of hereditary neuropathy of an underlying demyelinating sensorimotor polyneuropathy.    Right Lumbar Radiculitis  Abnormal MRI, cervical spine (05/10/2021)   (05/10/2021) CERVICAL MRI FINDINGS: Cord: Mild cord hyperintensity at C5, unchanged.  DISC LEVELS: C2-3: Mild left foraminal narrowing due to facet hypertrophy C3-4: Small central disc protrusion unchanged. Mild spinal stenosis. Bilateral facet degeneration. C4-5: ACDF with anterior plate. Mild left foraminal narrowing. C5-6: Solid interbody fusion without plate. C6-7: Solid interbody fusion without anterior plate. C7-T1: Mild disc and mild facet degeneration. T1-2: Central disc protrusion without stenosis.  IMPRESSION: Cervical fusion C4 through C7. Mild chronic cord myelomalacia at C5, unchanged Mild spinal stenosis at C3-4 due to disc protrusion and disc and facet degeneration.   Abnormal Ncs (Nerve Conduction Studies)    Upper extremity EMG IMPRESSION: Abnormal study. 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   There is an electrodiagnostic and sonographic evidence of bilateral median neuropathies at the wrists (i.e carpal tunnel syndrome) and electrodiagnostic and sonographic evidence of bilateral ulnar neuropathies at the elbows (i.e cubital tunnel syndrome).   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.    Gastroesophageal Reflux Disease    Plan of Care  Problem-specific:  No problem-specific Assessment & Plan notes found for this encounter.  Ms. LATRIA CEDERQUIST has a current medication list which includes the following long-term medication(s): azelastine, chlorpheniramine, levothyroxine, and lisinopril-hydrochlorothiazide.  Pharmacotherapy (Medications Ordered): No orders of the defined types were placed in this encounter.  Orders:  Orders Placed This Encounter  Procedures   Lumbar Epidural Injection    Standing Status:   Future  Standing Expiration Date:   11/28/2023    Scheduling Instructions:     Procedure: Interlaminar Lumbar Epidural Steroid injection (LESI)  L3-4     Laterality: Right-sided     Sedation: Patient's choice.     Timeframe: ASAA    Order Specific Question:   Where will this procedure be performed?    Answer:   [facility]   Follow-up plan:   Return for [facility]): (R) L3-4 LESI #1.      Interventional Therapies  Risk Factors  Considerations:  GERD  HTN  Anxiety  Panic Attacks     Planned  Pending:   Diagnostic/therapeutic right L1-2 vs L3-4 LESI #1    Under consideration:   Diagnostic bilateral lumbar facet MBB #2    Completed:   Diagnostic bilateral lumbar facet (L2-S1) MBB x1  (04/23/2023) (100/100/90/LBP:90(R)Groin pain) (03/25/2023) recommended to take Prilosec OTC, turmeric, and to follow IF (inflammatory factor) diet. Therapeutic IM Toradol 60/Robaxin 200 mg (03/25/2023)    Therapeutic  Palliative (PRN) options:   None established   Completed by other providers:   (12/30/2020, 06/30/2021) right IA hip joint injection x2 by Filomena Jungling, MD ([facility]) (12/16/2020) left IA hip joint injection x1 by Filomena Jungling, MD ([facility]) (03/29/2020) right IA hip joint injection x1 by Merri Ray, DO ([facility]) (08/28/2019) UE EMG/PNCV by Azzie Almas. MD ([facility]) (Abnormal study. There is an electrodiagnostic and sonographic evidence of bilateral median neuropathies at the wrists (i.e carpal tunnel syndrome) and electrodiagnostic and sonographic evidence of bilateral ulnar neuropathies at the elbows (i.e cubital tunnel syndrome). 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.) (04/30/2019) bilateral L3, L4 MBB x1 by Merri Ray, DO ([facility]) (03/19/2019) bilateral L4-5 transforaminal ESI x1 by Merri Ray, DO ([facility]) (12/22/2018) UE EMG/PNCV by Theora Master, MD [facility]) (severe bilateral median nerve neuropathy at wrist and bilateral ulnar neuropathies at elbow)      Recent Visits No visits were found meeting these conditions. Showing recent visits within past 90 days and meeting all other requirements Today's Visits Date Type Provider Dept  08/28/23 Office Visit Delano Metz, MD [facility]  Showing today's visits and meeting all other requirements Future Appointments No visits were found meeting these conditions. Showing future appointments within next 90 days and meeting all other requirements  I discussed the assessment and treatment plan with the patient. The patient was provided an  opportunity to ask questions and all were answered. The patient agreed with the plan and demonstrated an understanding of the instructions.  Patient advised to call back or seek an in-person evaluation if the symptoms or condition worsens.  Duration of encounter: 53 minutes.  Total time on encounter, as per AMA guidelines included both the face-to-face and non-face-to-face time personally spent by the physician and/or other qualified health care professional(s) on the day of the encounter (includes time in activities that require the physician or other qualified health care professional and does not include time in activities normally performed by clinical staff). Physician's time may include the following activities when performed: Preparing to see the patient (e.g., pre-charting review of records, searching for previously ordered imaging, lab work, and nerve conduction tests) Review of prior analgesic pharmacotherapies. Reviewing PMP Interpreting ordered tests (e.g., lab work, imaging, nerve conduction tests) Performing post-procedure evaluations, including interpretation of diagnostic procedures Obtaining and/or reviewing separately obtained history Performing a medically appropriate examination and/or evaluation Counseling and educating  the patient/family/caregiver Ordering medications, tests, or procedures Referring and communicating with other health care professionals (when not separately reported) Documenting clinical information in the electronic or other health record Independently interpreting results (not separately reported) and communicating results to the patient/ family/caregiver Care coordination (not separately reported)  Note by: Oswaldo Done, MD Date: 08/28/2023; Time: 10:41 AM

## 2023-08-28 ENCOUNTER — Ambulatory Visit: Payer: Medicare HMO | Attending: Pain Medicine | Admitting: Pain Medicine

## 2023-08-28 ENCOUNTER — Encounter: Payer: Self-pay | Admitting: Pain Medicine

## 2023-08-28 VITALS — BP 151/90 | HR 75 | Temp 97.0°F | Ht 60.0 in | Wt 172.0 lb

## 2023-08-28 DIAGNOSIS — G608 Other hereditary and idiopathic neuropathies: Secondary | ICD-10-CM | POA: Insufficient documentation

## 2023-08-28 DIAGNOSIS — R94130 Abnormal response to nerve stimulation, unspecified: Secondary | ICD-10-CM | POA: Insufficient documentation

## 2023-08-28 DIAGNOSIS — M79604 Pain in right leg: Secondary | ICD-10-CM | POA: Insufficient documentation

## 2023-08-28 DIAGNOSIS — M1612 Unilateral primary osteoarthritis, left hip: Secondary | ICD-10-CM | POA: Insufficient documentation

## 2023-08-28 DIAGNOSIS — M4316 Spondylolisthesis, lumbar region: Secondary | ICD-10-CM | POA: Diagnosis not present

## 2023-08-28 DIAGNOSIS — G8929 Other chronic pain: Secondary | ICD-10-CM | POA: Insufficient documentation

## 2023-08-28 DIAGNOSIS — M5416 Radiculopathy, lumbar region: Secondary | ICD-10-CM | POA: Insufficient documentation

## 2023-08-28 DIAGNOSIS — M5459 Other low back pain: Secondary | ICD-10-CM | POA: Diagnosis present

## 2023-08-28 DIAGNOSIS — M48061 Spinal stenosis, lumbar region without neurogenic claudication: Secondary | ICD-10-CM | POA: Insufficient documentation

## 2023-08-28 DIAGNOSIS — M51361 Other intervertebral disc degeneration, lumbar region with lower extremity pain only: Secondary | ICD-10-CM | POA: Insufficient documentation

## 2023-08-28 DIAGNOSIS — Z96641 Presence of right artificial hip joint: Secondary | ICD-10-CM | POA: Insufficient documentation

## 2023-08-28 DIAGNOSIS — R1031 Right lower quadrant pain: Secondary | ICD-10-CM | POA: Insufficient documentation

## 2023-08-28 DIAGNOSIS — M25551 Pain in right hip: Secondary | ICD-10-CM | POA: Insufficient documentation

## 2023-08-28 DIAGNOSIS — M79605 Pain in left leg: Secondary | ICD-10-CM | POA: Insufficient documentation

## 2023-08-28 DIAGNOSIS — R937 Abnormal findings on diagnostic imaging of other parts of musculoskeletal system: Secondary | ICD-10-CM | POA: Insufficient documentation

## 2023-08-28 DIAGNOSIS — M4310 Spondylolisthesis, site unspecified: Secondary | ICD-10-CM | POA: Diagnosis not present

## 2023-08-28 DIAGNOSIS — M25552 Pain in left hip: Secondary | ICD-10-CM | POA: Diagnosis not present

## 2023-08-28 NOTE — Patient Instructions (Signed)

## 2023-08-28 NOTE — Progress Notes (Signed)
Safety precautions to be maintained throughout the outpatient stay will include: orient to surroundings, keep bed in low position, maintain call bell within reach at all times, provide assistance with transfer out of bed and ambulation.  

## 2023-09-05 ENCOUNTER — Ambulatory Visit
Admission: RE | Admit: 2023-09-05 | Discharge: 2023-09-05 | Disposition: A | Discharge status: Home / Self Care | Payer: Medicare HMO | Source: Ambulatory Visit | Attending: Pain Medicine | Admitting: Pain Medicine

## 2023-09-05 ENCOUNTER — Ambulatory Visit: Payer: Medicare HMO | Attending: Pain Medicine | Admitting: Pain Medicine

## 2023-09-05 ENCOUNTER — Encounter: Payer: Self-pay | Admitting: Pain Medicine

## 2023-09-05 VITALS — BP 175/86 | HR 74 | Temp 97.3°F | Resp 14 | Ht 60.0 in | Wt 172.0 lb

## 2023-09-05 DIAGNOSIS — R937 Abnormal findings on diagnostic imaging of other parts of musculoskeletal system: Secondary | ICD-10-CM | POA: Diagnosis not present

## 2023-09-05 DIAGNOSIS — M4310 Spondylolisthesis, site unspecified: Secondary | ICD-10-CM | POA: Diagnosis not present

## 2023-09-05 DIAGNOSIS — M51361 Other intervertebral disc degeneration, lumbar region with lower extremity pain only: Secondary | ICD-10-CM | POA: Diagnosis not present

## 2023-09-05 DIAGNOSIS — M48061 Spinal stenosis, lumbar region without neurogenic claudication: Secondary | ICD-10-CM | POA: Diagnosis not present

## 2023-09-05 DIAGNOSIS — M25552 Pain in left hip: Secondary | ICD-10-CM | POA: Insufficient documentation

## 2023-09-05 DIAGNOSIS — Z96641 Presence of right artificial hip joint: Secondary | ICD-10-CM | POA: Insufficient documentation

## 2023-09-05 DIAGNOSIS — M5416 Radiculopathy, lumbar region: Secondary | ICD-10-CM

## 2023-09-05 DIAGNOSIS — M545 Low back pain, unspecified: Secondary | ICD-10-CM

## 2023-09-05 DIAGNOSIS — M79605 Pain in left leg: Secondary | ICD-10-CM | POA: Diagnosis present

## 2023-09-05 DIAGNOSIS — M47816 Spondylosis without myelopathy or radiculopathy, lumbar region: Secondary | ICD-10-CM

## 2023-09-05 DIAGNOSIS — M79604 Pain in right leg: Secondary | ICD-10-CM | POA: Insufficient documentation

## 2023-09-05 DIAGNOSIS — R1031 Right lower quadrant pain: Secondary | ICD-10-CM | POA: Diagnosis present

## 2023-09-05 DIAGNOSIS — G8929 Other chronic pain: Secondary | ICD-10-CM

## 2023-09-05 DIAGNOSIS — M25551 Pain in right hip: Secondary | ICD-10-CM | POA: Insufficient documentation

## 2023-09-05 DIAGNOSIS — M4316 Spondylolisthesis, lumbar region: Secondary | ICD-10-CM | POA: Diagnosis not present

## 2023-09-05 MED ORDER — ROPIVACAINE HCL 2 MG/ML IJ SOLN
2.0000 mL | Freq: Once | INTRAMUSCULAR | Status: AC
  Administered 2023-09-05: 2 mL via EPIDURAL
  Filled 2023-09-05: qty 20

## 2023-09-05 MED ORDER — SODIUM CHLORIDE (PF) 0.9 % IJ SOLN
INTRAMUSCULAR | Status: AC
  Filled 2023-09-05: qty 10

## 2023-09-05 MED ORDER — MIDAZOLAM HCL 2 MG/2ML IJ SOLN
0.5000 mg | Freq: Once | INTRAMUSCULAR | Status: DC
  Filled 2023-09-05: qty 2

## 2023-09-05 MED ORDER — TRIAMCINOLONE ACETONIDE 40 MG/ML IJ SUSP
40.0000 mg | Freq: Once | INTRAMUSCULAR | Status: AC
  Administered 2023-09-05: 40 mg
  Filled 2023-09-05: qty 1

## 2023-09-05 MED ORDER — LACTATED RINGERS IV SOLN: Freq: Once | INTRAVENOUS | Status: DC

## 2023-09-05 MED ORDER — IOHEXOL 180 MG/ML  SOLN
10.0000 mL | Freq: Once | INTRAMUSCULAR | Status: AC
  Administered 2023-09-05: 10 mL via EPIDURAL
  Filled 2023-09-05: qty 20

## 2023-09-05 MED ORDER — PENTAFLUOROPROP-TETRAFLUOROETH EX AERO
INHALATION_SPRAY | Freq: Once | CUTANEOUS | Status: AC
  Administered 2023-09-05: 30 via TOPICAL
  Filled 2023-09-05: qty 116

## 2023-09-05 MED ORDER — LIDOCAINE HCL 2 % IJ SOLN
20.0000 mL | Freq: Once | INTRAMUSCULAR | Status: AC
  Administered 2023-09-05: 400 mg
  Filled 2023-09-05: qty 20

## 2023-09-05 MED ORDER — SODIUM CHLORIDE 0.9% FLUSH
2.0000 mL | Freq: Once | INTRAVENOUS | Status: AC
  Administered 2023-09-05: 2 mL

## 2023-09-05 NOTE — Patient Instructions (Signed)

## 2023-09-05 NOTE — Progress Notes (Signed)
PROVIDER NOTE: Interpretation of information contained herein should be left to medically-trained personnel. Specific patient instructions are provided elsewhere under "Patient Instructions" section of medical record. This document was created in part using STT-dictation technology, any transcriptional errors that may result from this process are unintentional.  Patient: Evelyn Bentley Type: Established DOB: [birth year] MRN: [id number] PCP: Miki Kins, FNP  Service: Procedure DOS: 09/05/2023 Setting: Ambulatory Location: Ambulatory outpatient facility Delivery: Face-to-face Provider: Oswaldo Done, MD Specialty: Interventional Pain Management Specialty designation: 09 Location: Outpatient facility Ref. Prov.: Delano Metz, MD       Interventional Therapy   Procedure: Lumbar epidural steroid injection (LESI) (interlaminar) #1    Laterality: Right   Level:  L3-4 Level.  Imaging: Fluoroscopic guidance         Anesthesia: Local anesthesia (1-2% Lidocaine) Anxiolysis: IV Versed         Sedation: No Sedation                       DOS: 09/05/2023  Performed by: Oswaldo Done, MD  Purpose: Diagnostic/Therapeutic Indications: Lumbar radicular pain of intraspinal etiology of more than 4 weeks that has failed to respond to conservative therapy and is severe enough to impact quality of life or function. 1. Chronic low back pain (1ry area of Pain) (Bilateral) (R>L) w/o sciatica   2. Chronic lower extremity pain (Bilateral)   3. Degeneration of intervertebral disc of lumbar region with lower extremity pain   4. 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 Grade 1 Retrolisthesis of L1/L2 (1-2 mm)   6. 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 Osteoarthritis of lumbar spine   8. Abnormal MRI, lumbar spine (12/11/2022)    NAS-11 Pain score:   Pre-procedure: 3 /10   Post-procedure: 0-No pain/10      Position / Prep / Materials:  Position: Prone w/ head  of the table raised (slight reverse trendelenburg) to facilitate breathing.  Prep solution: ChloraPrep (2% chlorhexidine gluconate and 70% isopropyl alcohol) Prep Area: Entire Posterior Lumbar Region from lower scapular tip down to mid buttocks area and from flank to flank. Materials:  Tray: Epidural tray Needle(s):  Type: Epidural needle (Tuohy) Gauge (G):  17 Length: Regular (3.5-in) Qty: 1   H&P (Pre-op Assessment):  Evelyn Bentley is a 67 y.o. (year old), female patient, seen today for interventional treatment. She  has a past surgical history that includes Cholecystectomy; Neck surgery; Nissen fundoplication; Tubal ligation; Laparoscopic hysterectomy (Bilateral, 02/28/2016); Ureteroscopy with holmium laser lithotripsy (Left, 07/03/2016); Colonoscopy with propofol (N/A, 11/13/2016); Esophagogastroduodenoscopy (egd) with propofol (N/A, 11/13/2016); Abdominal hysterectomy; Esophagogastroduodenoscopy (egd) with propofol (N/A, 12/16/2018); Carpal tunnel release (Right, 09/01/2020); Cataract extraction, bilateral; Carpal tunnel release (Left, 01/05/2021); Anterior interosseous nerve decompression (Left, 01/05/2021); fundoflication (2015); and Total hip arthroplasty (Right, 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 Evelyn Bentley has a current medication list which includes the following prescription(s): acetaminophen, azelastine, celecoxib, chlorpheniramine, gabapentin, levothyroxine, lisinopril-hydrochlorothiazide, naloxone, ondansetron, oxycodone-acetaminophen, polyethylene glycol, rosuvastatin, and hydrocodone-acetaminophen, and the following Facility-Administered Medications: lactated ringers and midazolam. Her primarily concern today is the Hip Pain (right), Groin Pain (right), and Knee Pain (right)  Initial Vital Signs:  Pulse/HCG Rate: 74ECG Heart Rate: 77 Temp: (!) 97.3 F (36.3 C) Resp: 16 BP: (!) 163/92 SpO2: 100 %  BMI: Estimated body mass index is 33.59 kg/m as calculated from the following:   Height as of this  encounter: 5' (1.524 m).   Weight as of this encounter: 172 lb (78 kg).  Risk Assessment: Allergies: Reviewed. She is allergic to codeine.  Allergy Precautions: None required Coagulopathies: Reviewed. None identified.  Blood-thinner therapy: None at this time Active Infection(s): Reviewed. None identified. Evelyn Bentley is afebrile  Site Confirmation: Evelyn Bentley was asked to confirm the procedure and laterality before marking the site Procedure checklist: Completed Consent: Before the procedure and under the influence of no sedative(s), amnesic(s), or anxiolytics, the patient was informed of the treatment options, risks and possible complications. To fulfill our ethical and legal obligations, as recommended by the American Medical Association's Code of Ethics, I have informed the patient of my clinical impression; the nature and purpose of the treatment or procedure; the risks, benefits, and possible complications of the intervention; the alternatives, including doing nothing; the risk(s) and benefit(s) of the alternative treatment(s) or procedure(s); and the risk(s) and benefit(s) of doing nothing. The patient was provided information about the general risks and possible complications associated with the procedure. These may include, but are not limited to: failure to achieve desired goals, infection, bleeding, organ or nerve damage, allergic reactions, paralysis, and death. In addition, the patient was informed of those risks and complications associated to Spine-related procedures, such as failure to decrease pain; infection (i.e.: Meningitis, epidural or intraspinal abscess); bleeding (i.e.: epidural hematoma, subarachnoid hemorrhage, or any other type of intraspinal or peri-dural bleeding); organ or nerve damage (i.e.: Any type of peripheral nerve, nerve root, or spinal cord injury) with subsequent damage to sensory, motor, and/or autonomic systems, resulting in permanent pain, numbness, and/or  weakness of one or several areas of the body; allergic reactions; (i.e.: anaphylactic reaction); and/or death. Furthermore, the patient was informed of those risks and complications associated with the medications. These include, but are not limited to: allergic reactions (i.e.: anaphylactic or anaphylactoid reaction(s)); adrenal axis suppression; blood sugar elevation that in diabetics may result in ketoacidosis or comma; water retention that in patients with history of congestive heart failure may result in shortness of breath, pulmonary edema, and decompensation with resultant heart failure; weight gain; swelling or edema; medication-induced neural toxicity; particulate matter embolism and blood vessel occlusion with resultant organ, and/or nervous system infarction; and/or aseptic necrosis of one or more joints. Finally, the patient was informed that Medicine is not an exact science; therefore, there is also the possibility of unforeseen or unpredictable risks and/or possible complications that may result in a catastrophic outcome. The patient indicated having understood very clearly. We have given the patient no guarantees and we have made no promises. Enough time was given to the patient to ask questions, all of which were answered to the patient's satisfaction. Evelyn Bentley has indicated that she wanted to continue with the procedure. Attestation: I, the ordering provider, attest that I have discussed with the patient the benefits, risks, side-effects, alternatives, likelihood of achieving goals, and potential problems during recovery for the procedure that I have provided informed consent. Date  Time: 09/05/2023 10:57 AM   Pre-Procedure Preparation:  Monitoring: As per clinic protocol. Respiration, ETCO2, SpO2, BP, heart rate and rhythm monitor placed and checked for adequate function Safety Precautions: Patient was assessed for positional comfort and pressure points before starting the  procedure. Time-out: I initiated and conducted the "Time-out" before starting the procedure, as per protocol. The patient was asked to participate by confirming the accuracy of the "Time Out" information. Verification of the correct person, site, and procedure were performed and confirmed by me, the nursing staff, and the patient. "Time-out" conducted as per Joint Commission's Universal Protocol (  UP.01.01.01). Time: 1152 Start Time: 1152 hrs.  Description/Narrative of Procedure:          Target: Epidural space via interlaminar opening, initially targeting the lower laminar border of the superior vertebral body. Region: Lumbar Approach: Percutaneous paravertebral  Rationale (medical necessity): procedure needed and proper for the diagnosis and/or treatment of the patient's medical symptoms and needs. Procedural Technique Safety Precautions: Aspiration looking for blood return was conducted prior to all injections. At no point did we inject any substances, as a needle was being advanced. No attempts were made at seeking any paresthesias. Safe injection practices and needle disposal techniques used. Medications properly checked for expiration dates. SDV (single dose vial) medications used. Description of the Procedure: Protocol guidelines were followed. The procedure needle was introduced through the skin, ipsilateral to the reported pain, and advanced to the target area. Bone was contacted and the needle walked caudad, until the lamina was cleared. The epidural space was identified using "loss-of-resistance technique" with 2-3 ml of PF-NaCl (0.9% NSS), in a 5cc LOR glass syringe.  Vitals:   09/05/23 1059 09/05/23 1150 09/05/23 1155 09/05/23 1158  BP: (!) 147/88 (!) 169/91 (!) 159/97 (!) 175/86  Pulse:      Resp:  13 10 14   Temp:      TempSrc:      SpO2:  100% 100% 99%  Weight:      Height:        Start Time: 1152 hrs. End Time: 1157 hrs.  Imaging Guidance (Spinal):          Type of  Imaging Technique: Fluoroscopy Guidance (Spinal) Indication(s): Assistance in needle guidance and placement for procedures requiring needle placement in or near specific anatomical locations not easily accessible without such assistance. Exposure Time: Please see nurses notes. Contrast: Before injecting any contrast, we confirmed that the patient did not have an allergy to iodine, shellfish, or radiological contrast. Once satisfactory needle placement was completed at the desired level, radiological contrast was injected. Contrast injected under live fluoroscopy. No contrast complications. See chart for type and volume of contrast used. Fluoroscopic Guidance: I was personally present during the use of fluoroscopy. "Tunnel Vision Technique" used to obtain the best possible view of the target area. Parallax error corrected before commencing the procedure. "Direction-depth-direction" technique used to introduce the needle under continuous pulsed fluoroscopy. Once target was reached, antero-posterior, oblique, and lateral fluoroscopic projection used confirm needle placement in all planes. Images permanently stored in EMR. Interpretation: I personally interpreted the imaging intraoperatively. Adequate needle placement confirmed in multiple planes. Appropriate spread of contrast into desired area was observed. No evidence of afferent or efferent intravascular uptake. No intrathecal or subarachnoid spread observed. Permanent images saved into the patient's record.  Antibiotic Prophylaxis:   Anti-infectives (From admission, onward)    None      Indication(s): None identified  Post-operative Assessment:  Post-procedure Vital Signs:  Pulse/HCG Rate: 7474 Temp: (!) 97.3 F (36.3 C) Resp: 14 BP: (!) 175/86 SpO2: 99 %  EBL: None  Complications: No immediate post-treatment complications observed by team, or reported by patient.  Note: The patient tolerated the entire procedure well. A repeat set of  vitals were taken after the procedure and the patient was kept under observation following institutional policy, for this type of procedure. Post-procedural neurological assessment was performed, showing return to baseline, prior to discharge. The patient was provided with post-procedure discharge instructions, including a section on how to identify potential problems. Should any problems arise concerning this procedure,  the patient was given instructions to immediately contact us, at any time, without hesitation. In any case, we plan to contact the patient by telephone for a follow-up status report regarding this interventional procedure.  Comments:  No additional relevant information.  Plan of Care (POC)  Orders:  Orders Placed This Encounter  Procedures   Lumbar Epidural Injection    Scheduling Instructions:     Procedure: Interlaminar LESI L3-4     Laterality: Right     Sedation: Patient's choice     Timeframe: Today    Order Specific Question:   Where will this procedure be performed?    Answer:   [facility]   DG [facility] C-ARM 1-60 MIN NO REPORT    Intraoperative interpretation by procedural physician at [facility].    Standing Status:   Standing    Number of Occurrences:   1    Order Specific Question:   Reason for exam:    Answer:   Assistance in needle guidance and placement for procedures requiring needle placement in or near specific anatomical locations not easily accessible without such assistance.   Informed Consent Details: Physician/Practitioner Attestation; Transcribe to consent form and obtain patient signature    Note: Always confirm laterality of pain with Evelyn Bentley, before procedure. Transcribe to consent form and obtain patient signature.    Order Specific Question:   Physician/Practitioner attestation of informed consent for procedure/surgical case    Answer:   I, the physician/practitioner, attest that I have discussed with the patient the  benefits, risks, side effects, alternatives, likelihood of achieving goals and potential problems during recovery for the procedure that I have provided informed consent.    Order Specific Question:   Procedure    Answer:   Lumbar epidural steroid injection under fluoroscopic guidance    Order Specific Question:   Physician/Practitioner performing the procedure    Answer:   Mariavictoria Nottingham A. Laban Emperor, MD    Order Specific Question:   Indication/Reason    Answer:   Low back and/or lower extremity pain secondary to lumbar radiculitis   Provide equipment / supplies at bedside    Procedural tray: Epidural Tray (Disposable  single use) Skin infiltration needle: Regular 1.5-in, 25-G, (x1) Block needle size: Regular standard Catheter: No catheter required    Standing Status:   Standing    Number of Occurrences:   1    Order Specific Question:   Specify    Answer:   Epidural Tray   Chronic Opioid Analgesic:  No chronic opioid analgesics therapy prescribed by our practice. None MME/day: 0 mg/day   Medications ordered for procedure: Meds ordered this encounter  Medications   iohexol (OMNIPAQUE) 180 MG/ML injection 10 mL    Must be Myelogram-compatible. If not available, you may substitute with a water-soluble, non-ionic, hypoallergenic, myelogram-compatible radiological contrast medium.   lidocaine (XYLOCAINE) 2 % (with pres) injection 400 mg   pentafluoroprop-tetrafluoroeth (GEBAUERS) aerosol   lactated ringers infusion   midazolam (VERSED) injection 0.5-2 mg    Make sure Flumazenil is available in the pyxis when using this medication. If oversedation occurs, administer 0.2 mg IV over 15 sec. If after 45 sec no response, administer 0.2 mg again over 1 min; may repeat at 1 min intervals; not to exceed 4 doses (1 mg)   sodium chloride flush (NS) 0.9 % injection 2 mL   ropivacaine (PF) 2 mg/mL (0.2%) (NAROPIN) injection 2 mL   triamcinolone acetonide (KENALOG-40) injection 40 mg   Medications  administered: We administered iohexol, lidocaine, pentafluoroprop-tetrafluoroeth, sodium chloride flush, ropivacaine (PF) 2 mg/mL (0.2%), and triamcinolone acetonide.  See the medical record for exact dosing, route, and time of administration.  Follow-up plan:   Return in about 2 weeks (around 09/19/2023) for (Face2F), (PPE).       Interventional Therapies  Risk Factors  Considerations:  GERD  HTN  Anxiety  Panic Attacks     Planned  Pending:   Diagnostic/therapeutic right L1-2 vs L3-4 LESI #1    Under consideration:   Diagnostic bilateral lumbar facet MBB #2    Completed:   Diagnostic bilateral lumbar facet (L2-S1) MBB x1 (04/23/2023) (100/100/90/LBP:90(R)Groin pain) (03/25/2023) recommended to take Prilosec OTC, turmeric, and to follow IF (inflammatory factor) diet. Therapeutic IM Toradol 60/Robaxin 200 mg (03/25/2023)    Therapeutic  Palliative (PRN) options:   None established   Completed by other providers:   (12/30/2020, 06/30/2021) right IA hip joint injection x2 by Filomena Jungling, MD ([facility]) (12/16/2020) left IA hip joint injection x1 by Filomena Jungling, MD ([facility]) (03/29/2020) right IA hip joint injection x1 by Merri Ray, DO ([facility]) (08/28/2019) UE EMG/PNCV by Azzie Almas. MD ([facility]) (Abnormal study. There is an electrodiagnostic and sonographic evidence of bilateral median neuropathies at the wrists (i.e carpal tunnel syndrome) and electrodiagnostic and sonographic evidence of bilateral ulnar neuropathies at the elbows (i.e cubital tunnel syndrome). 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)       Recent Visits Date Type Provider Dept  08/28/23 Office Visit Delano Metz, MD [facility]  Showing recent visits within past 90 days and meeting all other requirements Today's Visits Date Type Provider Dept  09/05/23 Procedure visit Delano Metz, MD [facility]  Showing today's visits and meeting all other requirements Future Appointments Date Type Provider Dept  09/26/23 Appointment Delano Metz, MD [facility]  Showing future appointments within next 90 days and meeting all other requirements  Disposition: Discharge home  Discharge (Date  Time): 09/05/2023; 1202 hrs.   Primary Care Physician: Miki Kins, FNP Location: [facility] Note by: Oswaldo Done, MD (TTS technology used. I apologize for any typographical errors that were not detected and corrected.) Date: 09/05/2023; Time: 12:16 PM  Disclaimer:  Medicine is not an Visual merchandiser. The only guarantee in medicine is that nothing is guaranteed. It is important to note that the decision to proceed with this intervention was based on the information collected from the patient. The Data and conclusions were drawn from the patient's questionnaire, the interview, and the physical examination. Because the information was provided in large part by the patient, it cannot be guaranteed that it has not been purposely or unconsciously manipulated. Every effort has been made to obtain as much relevant data as possible for this evaluation. It is important to note that the conclusions that lead to this procedure are derived in large part from the available data. Always take into account that the treatment will also be dependent on availability of resources and existing treatment guidelines, considered by other Pain Management Practitioners as being common knowledge and  practice, at the time of  the intervention. For Medico-Legal purposes, it is also important to point out that variation in procedural techniques and pharmacological choices are the acceptable norm. The indications, contraindications, technique, and results of the above procedure should only be interpreted and judged by a Board-Certified Interventional Pain Specialist with extensive familiarity and expertise in the same exact procedure and technique.

## 2023-09-05 NOTE — Progress Notes (Signed)
Safety precautions to be maintained throughout the outpatient stay will include: orient to surroundings, keep bed in low position, maintain call bell within reach at all times, provide assistance with transfer out of bed and ambulation.  

## 2023-09-06 ENCOUNTER — Telehealth: Payer: Self-pay

## 2023-09-06 NOTE — Telephone Encounter (Signed)
Post procedure follow up.  Patient states she is doing good.  

## 2023-09-23 DIAGNOSIS — Z6834 Body mass index (BMI) 34.0-34.9, adult: Secondary | ICD-10-CM | POA: Diagnosis not present

## 2023-09-23 DIAGNOSIS — M5416 Radiculopathy, lumbar region: Secondary | ICD-10-CM | POA: Diagnosis not present

## 2023-09-23 DIAGNOSIS — R030 Elevated blood-pressure reading, without diagnosis of hypertension: Secondary | ICD-10-CM | POA: Diagnosis not present

## 2023-09-23 DIAGNOSIS — Z79899 Other long term (current) drug therapy: Secondary | ICD-10-CM | POA: Diagnosis not present

## 2023-09-23 DIAGNOSIS — G8929 Other chronic pain: Secondary | ICD-10-CM | POA: Diagnosis not present

## 2023-09-23 DIAGNOSIS — E6609 Other obesity due to excess calories: Secondary | ICD-10-CM | POA: Diagnosis not present

## 2023-09-23 DIAGNOSIS — Z6833 Body mass index (BMI) 33.0-33.9, adult: Secondary | ICD-10-CM | POA: Diagnosis not present

## 2023-09-25 DIAGNOSIS — Z79899 Other long term (current) drug therapy: Secondary | ICD-10-CM | POA: Diagnosis not present

## 2023-09-26 ENCOUNTER — Ambulatory Visit: Payer: Medicare HMO | Attending: Pain Medicine | Admitting: Pain Medicine

## 2023-09-26 ENCOUNTER — Encounter: Payer: Self-pay | Admitting: Pain Medicine

## 2023-09-26 VITALS — BP 144/85 | HR 85 | Temp 97.5°F | Ht 60.0 in | Wt 172.0 lb

## 2023-09-26 DIAGNOSIS — G8929 Other chronic pain: Secondary | ICD-10-CM

## 2023-09-26 DIAGNOSIS — Z09 Encounter for follow-up examination after completed treatment for conditions other than malignant neoplasm: Secondary | ICD-10-CM | POA: Diagnosis not present

## 2023-09-26 DIAGNOSIS — M25551 Pain in right hip: Secondary | ICD-10-CM | POA: Diagnosis not present

## 2023-09-26 DIAGNOSIS — M79605 Pain in left leg: Secondary | ICD-10-CM | POA: Diagnosis not present

## 2023-09-26 DIAGNOSIS — M545 Low back pain, unspecified: Secondary | ICD-10-CM

## 2023-09-26 DIAGNOSIS — M79604 Pain in right leg: Secondary | ICD-10-CM

## 2023-09-26 DIAGNOSIS — M25552 Pain in left hip: Secondary | ICD-10-CM

## 2023-09-26 NOTE — Progress Notes (Signed)
PROVIDER NOTE: Information contained herein reflects review and annotations entered in association with encounter. Interpretation of such information and data should be left to medically-trained personnel. Information provided to patient can be located elsewhere in the medical record under "Patient Instructions". Document created using STT-dictation technology, any transcriptional errors that may result from process are unintentional.    Patient: Evelyn Bentley  Service Category: E/M  Provider: Oswaldo Done, MD  DOB: Apr 15, 1956  DOS: 09/26/2023  Referring Provider: Miki Kins, FNP  MRN: [id number]  Specialty: Interventional Pain Management  PCP: Miki Kins, FNP  Type: Established Patient  Setting: Ambulatory outpatient    Location: Office  Delivery: Face-to-face     HPI  Ms. Exie Parody, a 67 y.o. year old female, is here today because of her Chronic bilateral low back pain without sciatica [M54.50, G89.29]. Ms. Struve primary complain today is Groin Pain (right)  Pertinent problems: Ms. Ekman has Fibromyalgia; Bursitis of hip; Chronic knee pain (5th area of Pain) (Bilateral) (R>L); Pain due to onychomycosis of toenails of both feet; Carpal tunnel syndrome (Bilateral); Neuropathy; Numbness and tingling; DDD (degenerative disc disease), lumbar; History of THR (total hip arthroplasty) (Right); Osteoarthritis of hands (Bilateral); Chronic pain syndrome; Abnormal MRI, lumbar spine (12/11/2022); Chronic low back pain (1ry area of Pain) (Bilateral) (R>L) w/o sciatica; Chronic hip pain (2ry area of Pain) (Bilateral) (R>L); Chronic hip pain after total replacement of hip joint (Right); Chronic neck pain (posterior) (3ry area of Pain) (Bilateral) (R>L); Cervicalgia; Chronic shoulder pain (4th area of Pain) (Bilateral) (R>L); Chronic hand pain (6th area of Pain) (Bilateral) (R=L); History of carpal tunnel release of wrists (Bilateral); Chronic feet pain (7th area of Pain) (Bilateral);  Charcot-Marie-Tooth disease (feet); Abnormal NCS (nerve conduction studies); Cubital tunnel syndrome (Bilateral); History of fusion of cervical spine (C4-C7); Cervical cord myelomalacia of (C5); Cervical spinal stenosis (C3-4); Cervical facet arthropathy; Cervical facet joint pain; 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, L4-5); DDD (degenerative disc disease), cervical; Lumbosacral facet arthropathy; 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); Osteoarthritis involving multiple joints; Abnormal MRI, cervical spine (05/10/2021); Osteoarthritis of hips (Bilateral); Osteoarthritis of knees (Bilateral); Osteoarthritis of feet (Bilateral); Osteoarthritis of shoulders (Bilateral); Osteoarthritis of glenohumeral joints (Bilateral); Osteoarthritis of acromioclavicular joints (Bilateral); Osteoarthritis of lumbar facet; Osteoarthritis of cervical facet; Osteoarthritis of cervical spine; Osteoarthritis of lumbar spine; Failed cervical fusion; Grade 1 Anterolisthesis of cervical spine (C2/C3) (2 mm); Grade 1 Retrolisthesis of L1/L2 (1-2 mm); Lumbar facet arthropathy (Bilateral); Lumbar facet syndrome; Cervical facet syndrome; Spondylosis without myelopathy or radiculopathy, lumbosacral region; Lumbar facet joint pain; Chronic groin pain (Right); Chronic lower extremity pain (Bilateral); Osteoarthritis of hip (femoroacetabular OA) (Left); Chronic hip pain (Left); Polyneuropathy, peripheral sensorimotor axonal; and Right lumbar radiculitis on their pertinent problem list. Pain Assessment: Severity of Chronic pain is reported as a 1 /10. Location: Groin Right/Denies. Onset: More than a month ago. Quality: Aching. Timing: Constant. Modifying factor(s): meds and procedures. Vitals:  height is 5' (1.524 m) and weight is 172 lb (78 kg). Her temperature is 97.5 F (36.4 C) (abnormal). Her blood pressure is 144/85 (abnormal) and her pulse is 85. Her oxygen saturation is 97%.  BMI: Estimated body mass index is 33.59 kg/m as  calculated from the following:   Height as of this encounter: 5' (1.524 m).   Weight as of this encounter: 172 lb (78 kg). Last encounter: 08/28/2023. Last procedure: 09/05/2023.  Reason for encounter: post-procedure evaluation and assessment. According to the patient she attained 100% relief of the  pain for the duration of local anesthetic and at this point she has an ongoing 90% improvement of her low back pain and lower extremity pain.  The patient is extremely happy with these results and she indicates that at this point she is doing well and does not need any further treatments for the time being.  The patient was reminded to give [place] a call if symptoms return.  He understood and accepted.  Post-procedure evaluation   Procedure: Lumbar epidural steroid injection (LESI) (interlaminar) #1    Laterality: Right   Level:  L3-4 Level.  Imaging: Fluoroscopic guidance         Anesthesia: Local anesthesia (1-2% Lidocaine) Anxiolysis: IV Versed         Sedation: No Sedation                       DOS: 09/05/2023  Performed by: Oswaldo Done, MD  Purpose: Diagnostic/Therapeutic Indications: Lumbar radicular pain of intraspinal etiology of more than 4 weeks that has failed to respond to conservative therapy and is severe enough to impact quality of life or function. 1. Chronic low back pain (1ry area of Pain) (Bilateral) (R>L) w/o sciatica   2. Chronic lower extremity pain (Bilateral)   3. Degeneration of intervertebral disc of lumbar region with lower extremity pain   4. 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 Grade 1 Retrolisthesis of L1/L2 (1-2 mm)   6. 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 Osteoarthritis of lumbar spine   8. Abnormal MRI, lumbar spine (12/11/2022)    NAS-11 Pain score:   Pre-procedure: 3 /10   Post-procedure: 0-No pain/10      Effectiveness:  Initial hour after procedure: 100 %. Subsequent 4-6 hours post-procedure: 100 %. Analgesia past  initial 6 hours: 90 %. Ongoing improvement:  Analgesic: According to the patient she attained 100% relief of the pain for the duration of local anesthetic and at this point she has an ongoing 90% improvement of her low back pain and lower extremity pain. Function: Ms. Kinzey reports improvement in function ROM: Ms. Breceda reports improvement in ROM  Pharmacotherapy Assessment  Analgesic: No chronic opioid analgesics therapy prescribed by our practice. None MME/day: 0 mg/day   Monitoring:  PMP: PDMP reviewed during this encounter.       Pharmacotherapy: No side-effects or adverse reactions reported. Compliance: No problems identified. Effectiveness: Clinically acceptable.  Brigitte Pulse, RN  09/26/2023  1:51 PM  Sign when Signing Visit Safety precautions to be maintained throughout the outpatient stay will include: orient to surroundings, keep bed in low position, maintain call bell within reach at all times, provide assistance with transfer out of bed and ambulation.     No results found for: "CBDTHCR" No results found for: "D8THCCBX" No results found for: "D9THCCBX"  UDS:  Summary  Date Value Ref Range Status  03/04/2023 Note  Final    Comment:    ==================================================================== Compliance Drug Analysis, Ur ==================================================================== Test                             Result       Flag       Units  Drug Present and Declared for Prescription Verification   Acetaminophen                  PRESENT      EXPECTED  Drug Present not  Declared for Prescription Verification   Carboxy-THC                    516          UNEXPECTED ng/mg creat    Carboxy-THC is a metabolite of tetrahydrocannabinol (THC). Source of    THC is most commonly herbal marijuana or marijuana-based products,    but THC is also present in a scheduled prescription medication.    Trace amounts of THC can be present in hemp and  cannabidiol (CBD)    products. This test is not intended to distinguish between delta-9-    tetrahydrocannabinol, the predominant form of THC in most herbal or    marijuana-based products, and delta-8-tetrahydrocannabinol.    Dextrorphan/Levorphanol        PRESENT      UNEXPECTED    Dextrorphan is an expected metabolite of dextromethorphan, an over-    the-counter or prescription cough suppressant. Levorphanol is a    scheduled prescription medication. Dextrorphan cannot be    distinguished from levorphanol by the method used for analysis.  Drug Absent but Declared for Prescription Verification   Chlorpheniramine               Not Detected UNEXPECTED ==================================================================== Test                      Result    Flag   Units      Ref Range   Creatinine              165              mg/dL      >=47 ==================================================================== Declared Medications:  The flagging and interpretation on this report are based on the  following declared medications.  Unexpected results may arise from  inaccuracies in the declared medications.   **Note: The testing scope of this panel includes these medications:   Chlorpheniramine   **Note: The testing scope of this panel does not include small to  moderate amounts of these reported medications:   Acetaminophen (Tylenol)   **Note: The testing scope of this panel does not include the  following reported medications:   Azelastine (Astelin)  Celecoxib (Celebrex)  Hydrochlorothiazide (Zestoretic)  Levothyroxine (Synthroid)  Lisinopril (Zestoretic)  Polyethylene Glycol (MiraLAX)  Rosuvastatin (Crestor)  Vitamin B12  Vitamin D2 (Drisdol) ==================================================================== For clinical consultation, please call [phone]. ====================================================================       ROS  Constitutional: Denies any  fever or chills Gastrointestinal: No reported hemesis, hematochezia, vomiting, or acute GI distress Musculoskeletal: Denies any acute onset joint swelling, redness, loss of ROM, or weakness Neurological: No reported episodes of acute onset apraxia, aphasia, dysarthria, agnosia, amnesia, paralysis, loss of coordination, or loss of consciousness  Medication Review  HYDROcodone-acetaminophen, acetaminophen, azelastine, celecoxib, chlorpheniramine, gabapentin, levothyroxine, lisinopril-hydrochlorothiazide, naloxone, ondansetron, oxyCODONE-acetaminophen, polyethylene glycol, and rosuvastatin  History Review  Allergy: Ms. Ruise is allergic to codeine. Drug: Ms. Beld  reports no history of drug use. Alcohol:  reports that she does not currently use alcohol. Tobacco:  reports that she quit smoking about 9 years ago. Her smoking use included cigarettes. She started smoking about 48 years ago. She has never used smokeless tobacco. Social: Ms. Inacio  reports that she quit smoking about 9 years ago. Her smoking use included cigarettes. She started smoking about 48 years ago. She has never used smokeless tobacco. She reports that she does not currently use alcohol. She reports that she does not  use drugs. Medical:  has a past medical history of Anxiety, Arthritis, Bursitis, Closed fracture of patella (05/06/2017), Complication of anesthesia, Depression, Family history of adverse reaction to anesthesia, Fibromyalgia, GERD (gastroesophageal reflux disease), H/O arthritis (03/06/2015), H/O: hypothyroidism (03/06/2015), History of 2019 novel coronavirus disease (COVID-19) (12/20/2020), History of kidney stones, History of migraine headaches (05/02/2015), Hypercholesteremia, Hypertension, Hypothyroidism, Knee pain (05/06/2017), PONV (postoperative nausea and vomiting), Pre-diabetes, Sepsis due to urinary tract infection (HCC) (07/02/2016), Sleep apnea, Status post total hip replacement, 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 Surgical: Ms. Kopcho  has a past surgical history that includes Cholecystectomy; Neck surgery; Nissen fundoplication; Tubal ligation; Laparoscopic hysterectomy (Bilateral, 02/28/2016); Ureteroscopy with holmium laser lithotripsy (Left, 07/03/2016); Colonoscopy with propofol (N/A, 11/13/2016); Esophagogastroduodenoscopy (egd) with propofol (N/A, 11/13/2016); Abdominal hysterectomy; Esophagogastroduodenoscopy (egd) with propofol (N/A, 12/16/2018); Carpal tunnel release (Right, 09/01/2020); Cataract extraction, bilateral; Carpal tunnel release (Left, 01/05/2021); Anterior interosseous nerve decompression (Left, 01/05/2021); fundoflication (2015); and Total hip arthroplasty (Right, 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 Family: family history includes Anxiety disorder in her mother; Depression in her mother.  Laboratory Chemistry Profile   Renal Lab Results  Component Value Date   BUN 13 04/05/2023   CREATININE 0.90 04/05/2023   BCR 14 04/05/2023   GFRAA >60 07/14/2016   GFRNONAA >60 01/21/2022    Hepatic Lab Results  Component Value Date   AST 21 04/05/2023   ALT 15 04/05/2023   ALBUMIN 4.5 04/05/2023   ALKPHOS 88 04/05/2023    Electrolytes Lab Results  Component Value Date   NA 142 04/05/2023   K 4.3 04/05/2023   CL 104 04/05/2023   CALCIUM 9.6 04/05/2023   MG 2.3 03/04/2023    Bone Lab Results  Component Value Date   VD25OH 57.7 04/05/2023    Inflammation (CRP: Acute Phase) (ESR: Chronic Phase) Lab Results  Component Value Date   CRP <1 01/11/2023   ESRSEDRATE 2 01/11/2023   LATICACIDVEN 0.6 07/02/2016         Note: Above Lab results reviewed.  Recent Imaging Review  DG PAIN CLINIC C-ARM 1-60 MIN NO REPORT Fluoro was used, but no Radiologist interpretation will be provided.  Please refer to "NOTES" tab for provider progress note. Note: Reviewed        Physical Exam  General appearance: Well nourished, well developed, and well hydrated. In  no apparent acute distress Mental status: Alert, oriented x 3 (person, place, & time)       Respiratory: No evidence of acute respiratory distress Eyes: PERLA Vitals: BP (!) 144/85   Pulse 85   Temp (!) 97.5 F (36.4 C)   Ht 5' (1.524 m)   Wt 172 lb (78 kg)   SpO2 97%   BMI 33.59 kg/m  BMI: Estimated body mass index is 33.59 kg/m as calculated from the following:   Height as of this encounter: 5' (1.524 m).   Weight as of this encounter: 172 lb (78 kg). Ideal: Ideal body weight: 45.5 kg (100 lb 4.9 oz) Adjusted ideal body weight: 58.5 kg (128 lb 15.8 oz)  Assessment   Diagnosis Status  1. Chronic low back pain (1ry area of Pain) (Bilateral) (R>L) w/o sciatica   2. Chronic lower extremity pain (Bilateral)   3. Chronic hip pain (2ry area of Pain) (Bilateral) (R>L)   4. Postop check    Controlled Controlled Controlled   Updated Problems: No problems updated.  Plan of Care  Problem-specific:  No problem-specific Assessment & Plan notes found for this encounter.  Ms. Kurstyn  A Hur has a current medication list which includes the following long-term medication(s): azelastine, chlorpheniramine, levothyroxine, and lisinopril-hydrochlorothiazide.  Pharmacotherapy (Medications Ordered): No orders of the defined types were placed in this encounter.  Orders:  Orders Placed This Encounter  Procedures   Nursing Instructions:    Please complete this patient's postprocedure evaluation.    Scheduling Instructions:     Please complete this patient's postprocedure evaluation.   Follow-up plan:   No follow-ups on file.      Interventional Therapies  Risk Factors  Considerations:  GERD  HTN  Anxiety  Panic Attacks     Planned  Pending:   Diagnostic/therapeutic right L1-2 vs L3-4 LESI #1    Under consideration:   Diagnostic bilateral lumbar facet MBB #2    Completed:   Diagnostic bilateral lumbar facet (L2-S1) MBB x1 (04/23/2023) (100/100/90/LBP:90(R)Groin  pain) (03/25/2023) recommended to take Prilosec OTC, turmeric, and to follow IF (inflammatory factor) diet. Therapeutic IM Toradol 60/Robaxin 200 mg (03/25/2023)    Therapeutic  Palliative (PRN) options:   None established   Completed by other providers:   (12/30/2020, 06/30/2021) right IA hip joint injection x2 by Filomena Jungling, MD ([facility]) (12/16/2020) left IA hip joint injection x1 by Filomena Jungling, MD ([facility]) (03/29/2020) right IA hip joint injection x1 by Merri Ray, DO ([facility]) (08/28/2019) UE EMG/PNCV by Azzie Almas. MD ([facility]) (Abnormal study. There is an electrodiagnostic and sonographic evidence of bilateral median neuropathies at the wrists (i.e carpal tunnel syndrome) and electrodiagnostic and sonographic evidence of bilateral ulnar neuropathies at the elbows (i.e cubital tunnel syndrome). 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 Showing future appointments within next 90 days and meeting all other requirements  I discussed the  assessment and treatment plan with the patient. The patient was provided an opportunity to ask questions and all were answered. The patient agreed with the plan and demonstrated an understanding of the instructions.  Patient advised to call back or seek an in-person evaluation if the symptoms or condition worsens.  Duration of encounter: 30 minutes.  Total time on encounter, as per AMA guidelines included both the face-to-face and non-face-to-face time personally spent by the physician and/or other qualified health care professional(s) on the day of the encounter (includes time in activities that require the physician or other qualified health care professional and does not include time in activities normally performed by clinical staff). Physician's time may include the following activities when performed: Preparing to see the patient (e.g., pre-charting review of records, searching for previously ordered imaging, lab work, and nerve conduction tests) Review of prior analgesic pharmacotherapies. Reviewing PMP Interpreting ordered tests (e.g., lab work, imaging, nerve conduction tests) Performing post-procedure evaluations,  including interpretation of diagnostic procedures Obtaining and/or reviewing separately obtained history Performing a medically appropriate examination and/or evaluation Counseling and educating the patient/family/caregiver Ordering medications, tests, or procedures Referring and communicating with other health care professionals (when not separately reported) Documenting clinical information in the electronic or other health record Independently interpreting results (not separately reported) and communicating results to the patient/ family/caregiver Care coordination (not separately reported)  Note by: Oswaldo Done, MD Date: 09/26/2023; Time: 2:13 PM

## 2023-09-26 NOTE — Progress Notes (Signed)
Safety precautions to be maintained throughout the outpatient stay will include: orient to surroundings, keep bed in low position, maintain call bell within reach at all times, provide assistance with transfer out of bed and ambulation.  

## 2023-09-27 ENCOUNTER — Ambulatory Visit (INDEPENDENT_AMBULATORY_CARE_PROVIDER_SITE_OTHER): Payer: Medicare HMO | Admitting: Family

## 2023-09-27 ENCOUNTER — Encounter: Payer: Self-pay | Admitting: Family

## 2023-09-27 VITALS — BP 130/80 | HR 85 | Ht 60.0 in | Wt 170.6 lb

## 2023-09-27 DIAGNOSIS — Z114 Encounter for screening for human immunodeficiency virus [HIV]: Secondary | ICD-10-CM

## 2023-09-27 DIAGNOSIS — R197 Diarrhea, unspecified: Secondary | ICD-10-CM

## 2023-09-27 DIAGNOSIS — R7303 Prediabetes: Secondary | ICD-10-CM

## 2023-09-27 DIAGNOSIS — E538 Deficiency of other specified B group vitamins: Secondary | ICD-10-CM

## 2023-09-27 DIAGNOSIS — E611 Iron deficiency: Secondary | ICD-10-CM

## 2023-09-27 DIAGNOSIS — E559 Vitamin D deficiency, unspecified: Secondary | ICD-10-CM | POA: Diagnosis not present

## 2023-09-27 DIAGNOSIS — Z1159 Encounter for screening for other viral diseases: Secondary | ICD-10-CM

## 2023-09-27 DIAGNOSIS — R5383 Other fatigue: Secondary | ICD-10-CM | POA: Diagnosis not present

## 2023-09-27 DIAGNOSIS — R635 Abnormal weight gain: Secondary | ICD-10-CM

## 2023-09-27 DIAGNOSIS — E782 Mixed hyperlipidemia: Secondary | ICD-10-CM | POA: Diagnosis not present

## 2023-09-27 DIAGNOSIS — E039 Hypothyroidism, unspecified: Secondary | ICD-10-CM

## 2023-09-27 MED ORDER — DEXAMETHASONE 4 MG PO TABS
8.0000 mg | ORAL_TABLET | Freq: Once | ORAL | 0 refills | Status: AC
Start: 2023-09-27 — End: 2023-09-27

## 2023-10-03 ENCOUNTER — Other Ambulatory Visit: Payer: Medicare HMO

## 2023-10-03 ENCOUNTER — Other Ambulatory Visit: Payer: Self-pay

## 2023-10-03 DIAGNOSIS — E782 Mixed hyperlipidemia: Secondary | ICD-10-CM | POA: Diagnosis not present

## 2023-10-03 DIAGNOSIS — E538 Deficiency of other specified B group vitamins: Secondary | ICD-10-CM | POA: Diagnosis not present

## 2023-10-03 DIAGNOSIS — E611 Iron deficiency: Secondary | ICD-10-CM

## 2023-10-03 DIAGNOSIS — R7303 Prediabetes: Secondary | ICD-10-CM

## 2023-10-03 DIAGNOSIS — E039 Hypothyroidism, unspecified: Secondary | ICD-10-CM

## 2023-10-03 DIAGNOSIS — R5383 Other fatigue: Secondary | ICD-10-CM

## 2023-10-03 DIAGNOSIS — R635 Abnormal weight gain: Secondary | ICD-10-CM

## 2023-10-03 DIAGNOSIS — R197 Diarrhea, unspecified: Secondary | ICD-10-CM | POA: Diagnosis not present

## 2023-10-03 DIAGNOSIS — Z114 Encounter for screening for human immunodeficiency virus [HIV]: Secondary | ICD-10-CM

## 2023-10-03 DIAGNOSIS — Z1159 Encounter for screening for other viral diseases: Secondary | ICD-10-CM | POA: Diagnosis not present

## 2023-10-03 DIAGNOSIS — E559 Vitamin D deficiency, unspecified: Secondary | ICD-10-CM

## 2023-10-03 DIAGNOSIS — D519 Vitamin B12 deficiency anemia, unspecified: Secondary | ICD-10-CM | POA: Diagnosis not present

## 2023-10-07 LAB — OVA AND PARASITE EXAMINATION

## 2023-10-07 LAB — PANCREATIC ELASTASE, FECAL: Pancreatic Elastase, Fecal: 50 ug Elast./g — ABNORMAL LOW (ref >200)

## 2023-10-08 LAB — STOOL CULTURE: E coli, Shiga toxin Assay: NEGATIVE

## 2023-10-16 LAB — CBC WITH DIFFERENTIAL/PLATELET
Basophils Absolute: 0 10*3/uL (ref 0.0–0.2)
Basos: 0 %
EOS (ABSOLUTE): 0 10*3/uL (ref 0.0–0.4)
Eos: 0 %
Hematocrit: 47.3 % — ABNORMAL HIGH (ref 34.0–46.6)
Hemoglobin: 15.4 g/dL (ref 11.1–15.9)
Immature Grans (Abs): 0 10*3/uL (ref 0.0–0.1)
Immature Granulocytes: 0 %
Lymphocytes Absolute: 0.9 10*3/uL (ref 0.7–3.1)
Lymphs: 12 %
MCH: 30.8 pg (ref 26.6–33.0)
MCHC: 32.6 g/dL (ref 31.5–35.7)
MCV: 95 fL (ref 79–97)
Monocytes Absolute: 0.1 10*3/uL (ref 0.1–0.9)
Monocytes: 1 %
Neutrophils Absolute: 6.8 10*3/uL (ref 1.4–7.0)
Neutrophils: 87 %
Platelets: 336 10*3/uL (ref 150–450)
RBC: 5 x10E6/uL (ref 3.77–5.28)
RDW: 13.3 % (ref 11.7–15.4)
WBC: 7.9 10*3/uL (ref 3.4–10.8)

## 2023-10-16 LAB — CMP14+EGFR
ALT: 17 [IU]/L (ref 0–32)
AST: 19 [IU]/L (ref 0–40)
Albumin: 4.6 g/dL (ref 3.9–4.9)
Alkaline Phosphatase: 91 [IU]/L (ref 44–121)
BUN/Creatinine Ratio: 7 — ABNORMAL LOW (ref 12–28)
BUN: 7 mg/dL — ABNORMAL LOW (ref 8–27)
Bilirubin Total: 0.7 mg/dL (ref 0.0–1.2)
CO2: 22 mmol/L (ref 20–29)
Calcium: 9.9 mg/dL (ref 8.7–10.3)
Chloride: 101 mmol/L (ref 96–106)
Creatinine, Ser: 0.97 mg/dL (ref 0.57–1.00)
Globulin, Total: 2.8 g/dL (ref 1.5–4.5)
Glucose: 145 mg/dL — ABNORMAL HIGH (ref 70–99)
Potassium: 4.6 mmol/L (ref 3.5–5.2)
Sodium: 140 mmol/L (ref 134–144)
Total Protein: 7.4 g/dL (ref 6.0–8.5)
eGFR: 65 mL/min/{1.73_m2} (ref >59)

## 2023-10-16 LAB — CORTISOL DEXAMETHASONE REFLEX: Cortisol, Serum LCMS: 1.5 ug/dL

## 2023-10-16 LAB — LIPID PANEL
Chol/HDL Ratio: 4.6 {ratio} — ABNORMAL HIGH (ref 0.0–4.4)
Cholesterol, Total: 288 mg/dL — ABNORMAL HIGH (ref 100–199)
HDL: 63 mg/dL (ref >39)
LDL Chol Calc (NIH): 214 mg/dL — ABNORMAL HIGH (ref 0–99)
Triglycerides: 70 mg/dL (ref 0–149)
VLDL Cholesterol Cal: 11 mg/dL (ref 5–40)

## 2023-10-16 LAB — IRON,TIBC AND FERRITIN PANEL
Ferritin: 23 ng/mL (ref 15–150)
Iron Saturation: 17 % (ref 15–55)
Iron: 67 ug/dL (ref 27–139)
Total Iron Binding Capacity: 384 ug/dL (ref 250–450)
UIBC: 317 ug/dL (ref 118–369)

## 2023-10-16 LAB — HEMOGLOBIN A1C
Est. average glucose Bld gHb Est-mCnc: 128 mg/dL
Hgb A1c MFr Bld: 6.1 % — ABNORMAL HIGH (ref 4.8–5.6)

## 2023-10-16 LAB — HCV INTERPRETATION

## 2023-10-16 LAB — HCV AB W REFLEX TO QUANT PCR: HCV Ab: NONREACTIVE

## 2023-10-16 LAB — VITAMIN B12: Vitamin B-12: 503 pg/mL (ref 232–1245)

## 2023-10-16 LAB — VITAMIN D 25 HYDROXY (VIT D DEFICIENCY, FRACTURES): Vit D, 25-Hydroxy: 20.8 ng/mL — ABNORMAL LOW (ref 30.0–100.0)

## 2023-10-16 LAB — TSH: TSH: 25.9 u[IU]/mL — ABNORMAL HIGH (ref 0.450–4.500)

## 2023-10-18 ENCOUNTER — Ambulatory Visit (INDEPENDENT_AMBULATORY_CARE_PROVIDER_SITE_OTHER): Payer: Medicare HMO | Admitting: Family

## 2023-10-18 ENCOUNTER — Encounter: Payer: Self-pay | Admitting: Family

## 2023-10-18 VITALS — BP 130/82 | HR 83 | Ht 60.0 in | Wt 171.4 lb

## 2023-10-18 DIAGNOSIS — K8681 Exocrine pancreatic insufficiency: Secondary | ICD-10-CM | POA: Insufficient documentation

## 2023-10-18 DIAGNOSIS — E039 Hypothyroidism, unspecified: Secondary | ICD-10-CM | POA: Diagnosis not present

## 2023-10-18 DIAGNOSIS — F331 Major depressive disorder, recurrent, moderate: Secondary | ICD-10-CM | POA: Diagnosis not present

## 2023-10-18 DIAGNOSIS — G118 Other hereditary ataxias: Secondary | ICD-10-CM

## 2023-10-18 DIAGNOSIS — E1142 Type 2 diabetes mellitus with diabetic polyneuropathy: Secondary | ICD-10-CM | POA: Diagnosis not present

## 2023-10-18 DIAGNOSIS — M150 Primary generalized (osteo)arthritis: Secondary | ICD-10-CM | POA: Diagnosis not present

## 2023-10-18 DIAGNOSIS — Z0000 Encounter for general adult medical examination without abnormal findings: Secondary | ICD-10-CM | POA: Diagnosis not present

## 2023-10-18 DIAGNOSIS — Z0001 Encounter for general adult medical examination with abnormal findings: Secondary | ICD-10-CM | POA: Diagnosis not present

## 2023-10-18 DIAGNOSIS — I5032 Chronic diastolic (congestive) heart failure: Secondary | ICD-10-CM | POA: Diagnosis not present

## 2023-10-18 MED ORDER — PANCRELIPASE (LIP-PROT-AMYL) 36000-114000 UNITS PO CPEP: ORAL_CAPSULE | ORAL | 1 refills | Status: DC

## 2023-10-19 LAB — TSH+T4F+T3FREE
Free T4: 0.87 ng/dL (ref 0.82–1.77)
T3, Free: 1.8 pg/mL — ABNORMAL LOW (ref 2.0–4.4)
TSH: 41.1 u[IU]/mL — ABNORMAL HIGH (ref 0.450–4.500)

## 2023-10-21 DIAGNOSIS — G8929 Other chronic pain: Secondary | ICD-10-CM | POA: Diagnosis not present

## 2023-10-21 DIAGNOSIS — E6609 Other obesity due to excess calories: Secondary | ICD-10-CM | POA: Diagnosis not present

## 2023-10-21 DIAGNOSIS — Z6833 Body mass index (BMI) 33.0-33.9, adult: Secondary | ICD-10-CM | POA: Diagnosis not present

## 2023-10-21 DIAGNOSIS — R030 Elevated blood-pressure reading, without diagnosis of hypertension: Secondary | ICD-10-CM | POA: Diagnosis not present

## 2023-10-21 DIAGNOSIS — Z6834 Body mass index (BMI) 34.0-34.9, adult: Secondary | ICD-10-CM | POA: Diagnosis not present

## 2023-10-21 DIAGNOSIS — Z79899 Other long term (current) drug therapy: Secondary | ICD-10-CM | POA: Diagnosis not present

## 2023-10-21 DIAGNOSIS — M5416 Radiculopathy, lumbar region: Secondary | ICD-10-CM | POA: Diagnosis not present

## 2023-10-28 DIAGNOSIS — E6609 Other obesity due to excess calories: Secondary | ICD-10-CM | POA: Diagnosis not present

## 2023-10-28 DIAGNOSIS — G8929 Other chronic pain: Secondary | ICD-10-CM | POA: Diagnosis not present

## 2023-10-28 DIAGNOSIS — Z79899 Other long term (current) drug therapy: Secondary | ICD-10-CM | POA: Diagnosis not present

## 2023-10-28 DIAGNOSIS — M5416 Radiculopathy, lumbar region: Secondary | ICD-10-CM | POA: Diagnosis not present

## 2023-10-28 DIAGNOSIS — Z6834 Body mass index (BMI) 34.0-34.9, adult: Secondary | ICD-10-CM | POA: Diagnosis not present

## 2023-11-10 ENCOUNTER — Encounter: Payer: Self-pay | Admitting: Family

## 2023-11-10 DIAGNOSIS — E1142 Type 2 diabetes mellitus with diabetic polyneuropathy: Secondary | ICD-10-CM | POA: Insufficient documentation

## 2023-11-10 DIAGNOSIS — I5032 Chronic diastolic (congestive) heart failure: Secondary | ICD-10-CM | POA: Insufficient documentation

## 2023-11-10 NOTE — Progress Notes (Signed)
Annual Wellness Visit  Patient: Evelyn Bentley, Female    DOB: 1955/11/25, 67 y.o.   MRN: [id number] Visit Date: 11/10/2023  Today's Provider: Miki Kins, FNP  Subjective:    Chief Complaint  Patient presents with   Follow-up   Annual Exam    AWV   Evelyn Bentley is a 67 y.o. female who presents today for her Annual Wellness Visit.  She had her labs drawn at her previous visit, so will discuss in detail today  TSH was still grossly abnormal, will double check today and add on T3 and T4 levels.  Also fecal elastase was found to be very low, consistent with severe exocrine pancreatic insufficiency.   No other concerns today.    Past Medical History:  Diagnosis Date   Abnormal drug screen (03/04/2023) 03/25/2023   (03/04/2023) UDS (+) for carboxy-THC (marijuana)      Abnormal MRI, cervical spine (05/10/2021) 03/25/2023   (05/10/2021) CERVICAL MRI FINDINGS:  Cord: Mild cord hyperintensity at C5, unchanged.     DISC LEVELS:  C2-3: Mild left foraminal narrowing due to facet hypertrophy  C3-4: Small central disc protrusion unchanged. Mild spinal stenosis. Bilateral facet degeneration.  C4-5: ACDF with anterior plate. Mild left foraminal narrowing.  C5-6: Solid interbody fusion without plate.  C6-7: Solid interbody fusi   Abnormal MRI, lumbar spine (12/11/2022) 03/04/2023   (12/11/2022) LUMBAR MRI FINDINGS:  Alignment:  Mild degenerative anterolisthesis at L3-4 and L4-5     DISC LEVELS:  T12- L1: Mild progression of chronic central protrusion.  L1-2: Small left paracentral protrusion with mild interval regression.  L2-3: Disc bulging eccentric to the left with inferior foraminal protrusion. Mild facet spurring. Mild left foraminal narrowing.  L3-4: Degenerative facet    Abnormal NCS (nerve conduction studies) 03/04/2023   Upper extremity EMG IMPRESSION: Abnormal study. 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      There is an electrodiagnostic and sonographic evidence of bilateral median neuropathies at the wrists (i.e carpal tunnel syndrome) and electrodiagnostic and s   Anxiety    Arthritis    Bursitis    Rt hip   Cervical facet joint pain 03/04/2023   Closed fracture of patella 05/06/2017   Complication of anesthesia    a.) PONV. 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   Family history of adverse reaction to anesthesia    a.) mother - severe PONV   Fibromyalgia    GERD (gastroesophageal reflux disease)    H/O arthritis 03/06/2015   H/O: hypothyroidism 03/06/2015   History of 2019 novel coronavirus disease (COVID-19) 12/20/2020   a.) preoperative PCR (+) on 12/20/2020; retested following day per pt/surgeon request; PCR once again (+) on 12/21/2020   History of carpal tunnel release of wrists (Bilateral) 03/04/2023   History of fusion of cervical spine (C4-C7) 03/04/2023   History of kidney stones    History of marijuana use 03/25/2023   (03/04/2023) UDS (+) for carboxy-THC (marijuana)      History of migraine headaches 05/02/2015   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   Hypercholesteremia    Hypertension    Hypothyroidism    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   Knee pain 05/06/2017   PONV (postoperative nausea and vomiting)    Pre-diabetes    Sepsis due to urinary tract infection (HCC) 07/02/2016  Sleep apnea    MILD- DOESN'T NEED cpap   Status post total hip replacement, 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   Thyroid disease    Weakness of limb 05/06/2017   Past Surgical History:  Procedure Laterality Date   ABDOMINAL HYSTERECTOMY     ANTERIOR INTEROSSEOUS NERVE DECOMPRESSION Left 01/05/2021   Procedure: Cubital tunnel release, left;  Surgeon: Kennedy Bucker, MD;  Location: [facility];  Service: Orthopedics;  Laterality: Left;   CARPAL  TUNNEL RELEASE Right 09/01/2020   Procedure: Right carpal tunnel release;  Surgeon: Kennedy Bucker, MD;  Location: [facility];  Service: Orthopedics;  Laterality: Right;   CARPAL TUNNEL RELEASE Left 01/05/2021   Procedure: Carpal tunnel release, left;  Surgeon: Kennedy Bucker, MD;  Location: [facility];  Service: Orthopedics;  Laterality: Left;   CATARACT EXTRACTION, BILATERAL     CHOLECYSTECTOMY     COLONOSCOPY WITH PROPOFOL N/A 11/13/2016   Procedure: COLONOSCOPY WITH PROPOFOL;  Surgeon: Christena Deem, MD;  Location: [facility];  Service: Endoscopy;  Laterality: N/A;   ESOPHAGOGASTRODUODENOSCOPY (EGD) WITH PROPOFOL N/A 11/13/2016   Procedure: ESOPHAGOGASTRODUODENOSCOPY (EGD) WITH PROPOFOL;  Surgeon: Christena Deem, MD;  Location: [facility];  Service: Endoscopy;  Laterality: N/A;   ESOPHAGOGASTRODUODENOSCOPY (EGD) WITH PROPOFOL N/A 12/16/2018   Procedure: ESOPHAGOGASTRODUODENOSCOPY (EGD) WITH PROPOFOL;  Surgeon: Christena Deem, MD;  Location: [facility] ENDOSCOPY;  Service: Endoscopy;  Laterality: N/A;   fundoflication  2015   at [facility] hospital   LAPAROSCOPIC HYSTERECTOMY Bilateral 02/28/2016   Procedure: HYSTERECTOMY TOTAL LAPAROSCOPIC / BSO;  Surgeon: Elenora Fender Ward, MD;  Location: [facility];  Service: Gynecology;  Laterality: Bilateral;   NECK SURGERY     X 2   NISSEN FUNDOPLICATION     TOTAL HIP ARTHROPLASTY Right 01/02/2022   Procedure: TOTAL HIP ARTHROPLASTY ANTERIOR APPROACH;  Surgeon: Kennedy Bucker, MD;  Location: [facility];  Service: Orthopedics;  Laterality: Right;   TUBAL LIGATION     URETEROSCOPY WITH HOLMIUM LASER LITHOTRIPSY Left 07/03/2016   Procedure: URETEROSCOPY WITH HOLMIUM LASER LITHOTRIPSY;  Surgeon: Orson Ape, MD;  Location: [facility];  Service: Urology;  Laterality: Left;   Family History  Problem Relation Age of Onset   Depression Mother    Anxiety disorder Mother    Breast cancer Neg Hx    Social History   Socioeconomic History   Marital status:  Widowed    Spouse name: Not on file   Number of children: 2   Years of education: Not on file   Highest education level: High school graduate  Occupational History    Comment: fulltime  Tobacco Use   Smoking status: Former    Current packs/day: 0.00    Types: Cigarettes    Start date: 11/14/1974    Quit date: 05/14/2014    Years since quitting: 9.4   Smokeless tobacco: Never  Vaping Use   Vaping status: Every Day   Substances: Nicotine  Substance and Sexual Activity   Alcohol use: Not Currently    Alcohol/week: 0.0 - 1.0 standard drinks of alcohol   Drug use: No   Sexual activity: Yes    Partners: Male    Birth control/protection: Condom  Other Topics Concern   Not on file  Social History Narrative   Not on file   Social Drivers of Health   Financial Resource Strain: Low Risk  (10/18/2023)   Overall Financial Resource Strain (CARDIA)    Difficulty of Paying Living Expenses: Not very hard  Food Insecurity: No Food Insecurity (10/18/2023)  Hunger Vital Sign    Worried About Running Out of Food in the Last Year: Never true    Ran Out of Food in the Last Year: Never true  Transportation Needs: No Transportation Needs (10/18/2023)   PRAPARE - Administrator, Civil Service (Medical): No    Lack of Transportation (Non-Medical): No  Physical Activity: Inactive (10/18/2023)   Exercise Vital Sign    Days of Exercise per Week: 0 days    Minutes of Exercise per Session: 0 min  Stress: Stress Concern Present (10/18/2023)   [employer] of Occupational Health - Occupational Stress Questionnaire    Feeling of Stress : Very much  Social Connections: Socially Isolated (10/18/2023)   Social Connection and Isolation Panel [NHANES]    Frequency of Communication with Friends and Family: Never    Frequency of Social Gatherings with Friends and Family: More than three times a week    Attends Religious Services: Never    Database administrator or Organizations: No    Attends  Banker Meetings: Never    Marital Status: Widowed  Intimate Partner Violence: Not At Risk (10/18/2023)   Humiliation, Afraid, Rape, and Kick questionnaire    Fear of Current or Ex-Partner: No    Emotionally Abused: No    Physically Abused: No    Sexually Abused: No    Medications: Outpatient Medications Prior to Visit  Medication Sig   acetaminophen (TYLENOL) 650 MG CR tablet Take 1,950 mg by mouth every 8 (eight) hours as needed for pain.   chlorpheniramine (CHLOR-TRIMETON) 4 MG tablet Take 4 mg by mouth 2 (two) times daily as needed for allergies.   gabapentin (NEURONTIN) 300 MG capsule Take 300 mg by mouth 3 (three) times daily.   levothyroxine (SYNTHROID) 125 MCG tablet Take 1 tablet (125 mcg total) by mouth daily before breakfast.   lisinopril-hydrochlorothiazide (PRINZIDE,ZESTORETIC) 10-12.5 MG tablet Take 1 tablet by mouth daily.    naloxone (NARCAN) nasal spray 4 mg/0.1 mL Place 1 spray into the nose once.   ondansetron (ZOFRAN) 8 MG tablet Take 8 mg by mouth as needed.   oxyCODONE-acetaminophen (PERCOCET/ROXICET) 5-325 MG tablet Take 1 tablet by mouth 3 (three) times daily as needed.   polyethylene glycol (MIRALAX / GLYCOLAX) 17 g packet Take 17 g by mouth daily as needed for mild constipation.   rosuvastatin (CRESTOR) 20 MG tablet Take 20 mg by mouth daily.   celecoxib (CELEBREX) 100 MG capsule Take 100 mg by mouth daily. (Patient not taking: Reported on 09/27/2023)   HYDROcodone-acetaminophen (NORCO/VICODIN) 5-325 MG tablet Take 1 tablet by mouth 3 (three) times daily as needed for moderate pain. (Patient not taking: Reported on 09/05/2023)   [DISCONTINUED] azelastine (ASTELIN) 0.1 % nasal spray Place 1 spray into both nostrils 2 (two) times daily as needed for allergies.  (Patient not taking: Reported on 10/18/2023)   No facility-administered medications prior to visit.    Allergies  Allergen Reactions   Codeine Nausea Only    Patient Care Team: Miki Kins, FNP as PCP - General (Family Medicine) Delano Metz, MD as Consulting Physician (Pain Medicine)  Review of Systems  Constitutional:  Positive for activity change, appetite change and fatigue.  Psychiatric/Behavioral:  Positive for dysphoric mood and sleep disturbance. The patient is nervous/anxious.   All other systems reviewed and are negative.      Objective:    Vitals: BP 130/82   Pulse 83   Ht 5' (1.524 m)   [employer]  171 lb 6.4 oz (77.7 kg)   SpO2 97%   BMI 33.47 kg/m    Physical Exam Vitals and nursing note reviewed.  Constitutional:      General: She is not in acute distress.    Appearance: Normal appearance. She is obese. She is ill-appearing.  HENT:     Head: Normocephalic and atraumatic.  Eyes:     Extraocular Movements: Extraocular movements intact.     Conjunctiva/sclera: Conjunctivae normal.     Pupils: Pupils are equal, round, and reactive to light.  Cardiovascular:     Rate and Rhythm: Normal rate.  Pulmonary:     Effort: Pulmonary effort is normal.  Neurological:     General: No focal deficit present.     Mental Status: She is alert and oriented to person, place, and time. Mental status is at baseline.     Sensory: Sensory deficit present.     Motor: Weakness present.     Coordination: Coordination is intact.     Gait: Gait is intact.  Psychiatric:        Attention and Perception: Attention normal.        Mood and Affect: Mood is depressed. Affect is flat.        Speech: Speech normal.        Behavior: Behavior normal. Behavior is cooperative.        Thought Content: Thought content normal.        Cognition and Memory: Cognition and memory normal.        Judgment: Judgment normal.     Most recent functional status assessment:    10/18/2023   11:24 AM  In your present state of health, do you have any difficulty performing the following activities:  Hearing? 0  Vision? 0  Difficulty concentrating or making decisions? 1  Walking or  climbing stairs? 1  Dressing or bathing? 0  Doing errands, shopping? 1  Preparing Food and eating ? N  Using the Toilet? N  In the past six months, have you accidently leaked urine? N  Do you have problems with loss of bowel control? N  Managing your Medications? N  Managing your Finances? N  Housekeeping or managing your Housekeeping? N    Most recent fall risk assessment:    10/18/2023   11:26 AM  Fall Risk   Falls in the past year? 0  Number falls in past yr: 0  Injury with Fall? 0  Risk for fall due to : Orthopedic patient;Impaired balance/gait  Follow up Falls evaluation completed;Falls prevention discussed;Education provided     Most recent depression screenings:    10/18/2023   11:26 AM 09/05/2023   11:04 AM  PHQ 2/9 Scores  PHQ - 2 Score 6 2  PHQ- 9 Score 16 6    Most recent cognitive screening:    10/18/2023   11:26 AM  6CIT Screen  What Year? 0 points  What month? 0 points  What time? 0 points  Count back from 20 0 points  Months in reverse 0 points  Repeat phrase 0 points  Total Score 0 points    Results for orders placed or performed in visit on 10/18/23  TSH+T4F+T3Free  Result Value Ref Range   TSH 41.100 (H) 0.450 - 4.500 uIU/mL   T3, Free 1.8 (L) 2.0 - 4.4 pg/mL   Free T4 0.87 0.82 - 1.77 ng/dL       Assessment & Plan:      Annual wellness visit done today  including the all of the following: Reviewed patient's Family Medical History Reviewed and updated list of patient's medical providers Assessment of cognitive impairment was done Assessed patient's functional ability Established a written schedule for health screening services Health Risk Assessent Completed and Reviewed  Exercise Activities and Dietary recommendations  Goals      Coping Skills Enhanced     Evidence-based guidance:  Acknowledge, normalize and validate difficulty of making life-long lifestyle changes.  Identify current effective and ineffective coping strategies.   Encourage patient and caregiver participation in care to increase self-esteem, confidence and feelings of control.  Consider alternative and complementary therapy approaches such as meditation, mindfulness or yoga.  Encourage participation in cognitive behavioral therapy to foster a positive identity, increase self-awareness, as well as bolster self-esteem, confidence and self-efficacy.  Discuss spirituality; be present as concerns are identified; encourage journaling, prayer, worship services, meditation or pastoral counseling.  Encourage participation in pleasurable group activities such as hobbies, singing, sports or volunteering).  Encourage the use of mindfulness; refer for training or intensive intervention.  Consider the use of meditative movement therapy such as tai chi, yoga or qigong.  Promote a regular daily exercise program based on tolerance, ability and patient choice to support positive thinking about disease or aging.   Notes:      Increase physical activity     Maintain Mobility and Function     Evidence-based guidance:  Acknowledge and validate impact of pain, loss of strength and potential disfigurement (hand osteoarthritis) on mental health and daily life, such as social isolation, anxiety, depression, impaired sexual relationship and   injury from falls.  Anticipate referral to physical or occupational therapy for assessment, therapeutic exercise and recommendation for adaptive equipment or assistive devices; encourage participation.  Assess impact on ability to perform activities of daily living, as well as engage in sports and leisure events or requirements of work or school.  Provide anticipatory guidance and reassurance about the benefit of exercise to maintain function; acknowledge and normalize fear that exercise may worsen symptoms.  Encourage regular exercise, at least 10 minutes at a time for 45 minutes per week; consider yoga, water exercise and proprioceptive  exercises; encourage use of wearable activity tracker to increase motivation and adherence.  Encourage maintenance or resumption of daily activities, including employment, as pain allows and with minimal exposure to trauma.  Assist patient to advocate for adaptations to the work environment.  Consider level of pain and function, gender, age, lifestyle, patient preference, quality of life, readiness and capacity to benefit when recommending patients for orthopaedic surgery consultation.  Explore strategies, such as changes to medication regimen or activity that enables patient to anticipate and manage flare-ups that increase deconditioning and disability.  Explore patient preferences; encourage exposure to a broader range of activities that have been avoided for fear of experiencing pain.  Identify barriers to participation in therapy or exercise, such as pain with activity, anticipated or imagined pain.  Monitor postoperative joint replacement or any preexisting joint replacement for ongoing pain and loss of function; provide social support and encouragement throughout recovery.   Notes:         Immunization History  Administered Date(s) Administered   Influenza,inj,Quad PF,6+ Mos 10/03/2018   Zoster Recombinant(Shingrix) 10/03/2018    Health Maintenance  Topic Date Due   COVID-19 Vaccine (1) Never done   Pneumonia Vaccine 45+ Years old (1 of 2 - PCV) Never done   FOOT EXAM  Never done   OPHTHALMOLOGY EXAM  Never done  Diabetic kidney evaluation - Urine ACR  Never done   DTaP/Tdap/Td (1 - Tdap) Never done   Cervical Cancer Screening (HPV/Pap Cotest)  Never done   Lung Cancer Screening  07/15/2017   Zoster Vaccines- Shingrix (2 of 2) 11/28/2018   MAMMOGRAM  06/16/2022   DEXA SCAN  Never done   INFLUENZA VACCINE  06/13/2023   HEMOGLOBIN A1C  04/01/2024   Diabetic kidney evaluation - eGFR measurement  10/02/2024   Medicare Annual Wellness (AWV)  10/17/2024   Colonoscopy   11/13/2026   Hepatitis C Screening  Completed   HIV Screening  Completed   HPV VACCINES  Aged Out     Discussed health benefits of physical activity, and encouraged her to engage in regular exercise appropriate for her age and condition.         Miki Kins, FNP   10/18/2023  This document may have been prepared by [facility] Voice Recognition software and as such may include unintentional dictation errors.

## 2023-11-19 ENCOUNTER — Ambulatory Visit: Payer: Medicare HMO | Admitting: Family

## 2023-11-19 ENCOUNTER — Encounter: Payer: Self-pay | Admitting: Family

## 2023-11-19 ENCOUNTER — Other Ambulatory Visit: Payer: Self-pay | Admitting: Pain Medicine

## 2023-11-19 VITALS — BP 122/66 | HR 92 | Ht 60.0 in | Wt 171.4 lb

## 2023-11-19 DIAGNOSIS — E039 Hypothyroidism, unspecified: Secondary | ICD-10-CM

## 2023-11-19 DIAGNOSIS — Z0130 Encounter for examination of blood pressure without abnormal findings: Secondary | ICD-10-CM

## 2023-11-19 DIAGNOSIS — R300 Dysuria: Secondary | ICD-10-CM | POA: Diagnosis not present

## 2023-11-19 DIAGNOSIS — E1142 Type 2 diabetes mellitus with diabetic polyneuropathy: Secondary | ICD-10-CM

## 2023-11-19 DIAGNOSIS — E559 Vitamin D deficiency, unspecified: Secondary | ICD-10-CM

## 2023-11-19 LAB — POC CREATINE & ALBUMIN,URINE
Creatinine, POC: 300 mg/dL
Microalbumin Ur, POC: 80 mg/L

## 2023-11-19 LAB — POCT URINALYSIS DIPSTICK
Glucose, UA: NEGATIVE
Nitrite, UA: POSITIVE
Protein, UA: POSITIVE — AB
Spec Grav, UA: 1.02 (ref 1.010–1.025)
Urobilinogen, UA: 0.2 U/dL
pH, UA: 5.5 (ref 5.0–8.0)

## 2023-11-19 MED ORDER — DULOXETINE HCL 30 MG PO CPEP: 30.0000 mg | ORAL_CAPSULE | Freq: Every day | ORAL | 3 refills | Status: DC

## 2023-11-19 MED ORDER — NITROFURANTOIN MONOHYD MACRO 100 MG PO CAPS: 100.0000 mg | ORAL_CAPSULE | Freq: Two times a day (BID) | ORAL | 0 refills | Status: DC

## 2023-11-19 MED ORDER — ROSUVASTATIN CALCIUM 20 MG PO TABS: 20.0000 mg | ORAL_TABLET | Freq: Every day | ORAL | 1 refills | Status: DC

## 2023-11-19 NOTE — Progress Notes (Signed)
 Established Patient Office Visit  Subjective:  Patient ID: Evelyn Bentley, female    DOB: 12-09-1955  Age: 68 y.o. MRN: [id number]  Chief Complaint  Patient presents with   Follow-up    1 month follow up    Patient is here today for 1 month follow-up.  She is doing somewhat better says that the pancreatic enzymes have been helping with her diarrhea symptoms. She does say that she is concerned that there is a possibility she just has not gotten handle on the medication yet.  She is still having severe fatigue and is still losing hair.  Her thyroid  was very abnormal at last check so we were going to recheck this today after she had restarted her medication.    No other concerns at this time.   Past Medical History:  Diagnosis Date   Abnormal drug screen (03/04/2023) 03/25/2023   (03/04/2023) UDS (+) for carboxy-THC (marijuana)      Abnormal MRI, cervical spine (05/10/2021) 03/25/2023   (05/10/2021) CERVICAL MRI FINDINGS:  Cord: Mild cord hyperintensity at C5, unchanged.     DISC LEVELS:  C2-3: Mild left foraminal narrowing due to facet hypertrophy  C3-4: Small central disc protrusion unchanged. Mild spinal stenosis. Bilateral facet degeneration.  C4-5: ACDF with anterior plate. Mild left foraminal narrowing.  C5-6: Solid interbody fusion without plate.  C6-7: Solid interbody fusi   Abnormal MRI, lumbar spine (12/11/2022) 03/04/2023   (12/11/2022) LUMBAR MRI FINDINGS:  Alignment:  Mild degenerative anterolisthesis at L3-4 and L4-5     DISC LEVELS:  T12- L1: Mild progression of chronic central protrusion.  L1-2: Small left paracentral protrusion with mild interval regression.  L2-3: Disc bulging eccentric to the left with inferior foraminal protrusion. Mild facet spurring. Mild left foraminal narrowing.  L3-4: Degenerative facet    Abnormal NCS (nerve conduction studies) 03/04/2023   Upper extremity EMG IMPRESSION: Abnormal study. 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      There is an electrodiagnostic and sonographic evidence of bilateral median neuropathies at the wrists (i.e carpal tunnel syndrome) and electrodiagnostic and s   Anxiety    Arthritis    Bursitis    Rt hip   Cervical facet joint pain 03/04/2023   Closed fracture of patella 05/06/2017   Complication of anesthesia    a.) PONV. 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   Family history of adverse reaction to anesthesia    a.) mother - severe PONV   Fibromyalgia    GERD (gastroesophageal reflux disease)    H/O arthritis 03/06/2015   H/O: hypothyroidism 03/06/2015   History of 2019 novel coronavirus disease (COVID-19) 12/20/2020   a.) preoperative PCR (+) on 12/20/2020; retested following day per pt/surgeon request; PCR once again (+) on 12/21/2020   History of carpal tunnel release of wrists (Bilateral) 03/04/2023   History of fusion of cervical spine (C4-C7) 03/04/2023   History of kidney stones    History of marijuana use 03/25/2023   (03/04/2023) UDS (+) for carboxy-THC (marijuana)      History of migraine headaches 05/02/2015   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   Hypercholesteremia    Hypertension    Hypothyroidism    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   Knee pain 05/06/2017   PONV (postoperative nausea and vomiting)    Pre-diabetes  Sepsis due to urinary tract infection (HCC) 07/02/2016   Sleep apnea    MILD- DOESN'T NEED cpap   Status post total hip replacement, 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   Thyroid  disease    Weakness of limb 05/06/2017    Past Surgical History:  Procedure Laterality Date   ABDOMINAL HYSTERECTOMY     ANTERIOR INTEROSSEOUS NERVE DECOMPRESSION Left 01/05/2021   Procedure: Cubital tunnel release, left;  Surgeon: Kathlynn Sharper, MD;  Location: [facility]  ORS;  Service: Orthopedics;  Laterality: Left;   CARPAL TUNNEL RELEASE Right 09/01/2020   Procedure: Right carpal tunnel release;  Surgeon: Kathlynn Sharper, MD;  Location: [facility] ORS;  Service: Orthopedics;  Laterality: Right;   CARPAL TUNNEL RELEASE Left 01/05/2021   Procedure: Carpal tunnel release, left;  Surgeon: Kathlynn Sharper, MD;  Location: [facility] ORS;  Service: Orthopedics;  Laterality: Left;   CATARACT EXTRACTION, BILATERAL     CHOLECYSTECTOMY     COLONOSCOPY WITH PROPOFOL  N/A 11/13/2016   Procedure: COLONOSCOPY WITH PROPOFOL ;  Surgeon: Gladis RAYMOND Mariner, MD;  Location: [facility] ENDOSCOPY;  Service: Endoscopy;  Laterality: N/A;   ESOPHAGOGASTRODUODENOSCOPY (EGD) WITH PROPOFOL  N/A 11/13/2016   Procedure: ESOPHAGOGASTRODUODENOSCOPY (EGD) WITH PROPOFOL ;  Surgeon: Gladis RAYMOND Mariner, MD;  Location: [facility] ENDOSCOPY;  Service: Endoscopy;  Laterality: N/A;   ESOPHAGOGASTRODUODENOSCOPY (EGD) WITH PROPOFOL  N/A 12/16/2018   Procedure: ESOPHAGOGASTRODUODENOSCOPY (EGD) WITH PROPOFOL ;  Surgeon: Mariner Gladis RAYMOND, MD;  Location: [facility]COPY;  Service: Endoscopy;  Laterality: N/A;   fundoflication  2015   at [facility]   LAPAROSCOPIC HYSTERECTOMY Bilateral 02/28/2016   Procedure: HYSTERECTOMY TOTAL LAPAROSCOPIC / BSO;  Surgeon: Mitzie BROCKS Ward, MD;  Location: [facility] ORS;  Service: Gynecology;  Laterality: Bilateral;   NECK SURGERY     X 2   NISSEN FUNDOPLICATION     TOTAL HIP ARTHROPLASTY Right 01/02/2022   Procedure: TOTAL HIP ARTHROPLASTY ANTERIOR APPROACH;  Surgeon: Kathlynn Sharper, MD;  Location: [facility] ORS;  Service: Orthopedics;  Laterality: Right;   TUBAL LIGATION     URETEROSCOPY WITH HOLMIUM LASER LITHOTRIPSY Left 07/03/2016   Procedure: URETEROSCOPY WITH HOLMIUM LASER LITHOTRIPSY;  Surgeon: Sharper JONELLE Burkes, MD;  Location: [facility] ORS;  Service: Urology;  Laterality: Left;    Social History   Socioeconomic History   Marital status: Widowed    Spouse name: Not on file   Number of children: 2    Years of education: Not on file   Highest education level: High school graduate  Occupational History    Comment: fulltime  Tobacco Use   Smoking status: Former    Current packs/day: 0.00    Types: Cigarettes    Start date: 11/14/1974    Quit date: 05/14/2014    Years since quitting: 9.5   Smokeless tobacco: Never  Vaping Use   Vaping status: Every Day   Substances: Nicotine  Substance and Sexual Activity   Alcohol  use: Not Currently    Alcohol /week: 0.0 - 1.0 standard drinks of alcohol    Drug use: No   Sexual activity: Yes    Partners: Male    Birth control/protection: Condom  Other Topics Concern   Not on file  Social History Narrative   Not on file   Social Drivers of Health   Financial Resource Strain: Low Risk  (10/18/2023)   Overall Financial Resource Strain (CARDIA)    Difficulty of Paying Living Expenses: Not very hard  Food Insecurity: No Food Insecurity (10/18/2023)   Hunger Vital Sign    Worried About Running Out of Food in the Last Year:  Never true    Ran Out of Food in the Last Year: Never true  Transportation Needs: No Transportation Needs (10/18/2023)   PRAPARE - Administrator, Civil Service (Medical): No    Lack of Transportation (Non-Medical): No  Physical Activity: Inactive (10/18/2023)   Exercise Vital Sign    Days of Exercise per Week: 0 days    Minutes of Exercise per Session: 0 min  Stress: Stress Concern Present (10/18/2023)   [employer] of Occupational Health - Occupational Stress Questionnaire    Feeling of Stress : Very much  Social Connections: Socially Isolated (10/18/2023)   Social Connection and Isolation Panel [NHANES]    Frequency of Communication with Friends and Family: Never    Frequency of Social Gatherings with Friends and Family: More than three times a week    Attends Religious Services: Never    Database Administrator or Organizations: No    Attends Banker Meetings: Never    Marital Status: Widowed   Intimate Partner Violence: Not At Risk (10/18/2023)   Humiliation, Afraid, Rape, and Kick questionnaire    Fear of Current or Ex-Partner: No    Emotionally Abused: No    Physically Abused: No    Sexually Abused: No    Family History  Problem Relation Age of Onset   Depression Mother    Anxiety disorder Mother    Breast cancer Neg Hx     Allergies  Allergen Reactions   Codeine Nausea Only    Review of Systems  Constitutional:  Positive for malaise/fatigue.  Endo/Heme/Allergies:        Hair loss  All other systems reviewed and are negative.      Objective:   BP 122/66   Pulse 92   Ht 5' (1.524 m)   Wt 171 lb 6.4 oz (77.7 kg)   SpO2 95%   BMI 33.47 kg/m   Vitals:   11/19/23 1000  BP: 122/66  Pulse: 92  Height: 5' (1.524 m)  Weight: 171 lb 6.4 oz (77.7 kg)  SpO2: 95%  BMI (Calculated): 33.47    Physical Exam Vitals and nursing note reviewed.  Constitutional:      Appearance: Normal appearance. She is normal weight.  HENT:     Head: Normocephalic.  Eyes:     Extraocular Movements: Extraocular movements intact.     Conjunctiva/sclera: Conjunctivae normal.     Pupils: Pupils are equal, round, and reactive to light.  Cardiovascular:     Rate and Rhythm: Normal rate.  Pulmonary:     Effort: Pulmonary effort is normal.  Neurological:     General: No focal deficit present.     Mental Status: She is alert and oriented to person, place, and time. Mental status is at baseline.  Psychiatric:        Mood and Affect: Mood normal.        Behavior: Behavior normal.        Thought Content: Thought content normal.        Judgment: Judgment normal.      Results for orders placed or performed in visit on 11/19/23  POC CREATINE & ALBUMIN,URINE  Result Value Ref Range   Microalbumin Ur, POC 80 mg/L   Creatinine, POC 300 mg/dL   Albumin/Creatinine Ratio, Urine, POC 30-300   POCT Urinalysis Dipstick (18997)  Result Value Ref Range   Color, UA orange     Clarity, UA cloudy    Glucose, UA Negative Negative  Bilirubin, UA small    Ketones, UA trace    Spec Grav, UA 1.020 1.010 - 1.025   Blood, UA trace    pH, UA 5.5 5.0 - 8.0   Protein, UA Positive (A) Negative   Urobilinogen, UA 0.2 0.2 or 1.0 E.U./dL   Nitrite, UA pos    Leukocytes, UA Small (1+) (A) Negative   Appearance cloudy    Odor yes     Recent Results (from the past 2160 hours)  Lipid panel     Status: Abnormal   Collection Time: 10/03/23  8:34 AM  Result Value Ref Range   Cholesterol, Total 288 (H) 100 - 199 mg/dL   Triglycerides 70 0 - 149 mg/dL   HDL 63 >60 mg/dL   VLDL Cholesterol Cal 11 5 - 40 mg/dL   LDL Chol Calc (NIH) 785 (H) 0 - 99 mg/dL   LDL CALC COMMENT: Comment     Comment: Consider evaluating for Familial Hypercholesterolemia(FH), if clinically indicated.    Chol/HDL Ratio 4.6 (H) 0.0 - 4.4 ratio    Comment:                                   T. Chol/HDL Ratio                                             Men  Women                               1/2 Avg.Risk  3.4    3.3                                   Avg.Risk  5.0    4.4                                2X Avg.Risk  9.6    7.1                                3X Avg.Risk 23.4   11.0   VITAMIN D  25 Hydroxy (Vit-D Deficiency, Fractures)     Status: Abnormal   Collection Time: 10/03/23  8:34 AM  Result Value Ref Range   Vit D, 25-Hydroxy 20.8 (L) 30.0 - 100.0 ng/mL    Comment: Vitamin D  deficiency has been defined by the Institute of Medicine and an Endocrine Society practice guideline as a level of serum 25-OH vitamin D  less than 20 ng/mL (1,2). The Endocrine Society went on to further define vitamin D  insufficiency as a level between 21 and 29 ng/mL (2). 1. IOM (Institute of Medicine). 2010. Dietary reference    intakes for calcium  and D. [place]: The    [employer]. 2. Holick MF, Binkley [place], Bischoff-Ferrari HA, et al.    Evaluation, treatment, and prevention of vitamin D      deficiency: an Endocrine Society clinical practice    guideline. JCEM. 2011 Jul; 96(7):1911-30.   CMP14+EGFR     Status: Abnormal   Collection Time: 10/03/23  8:34 AM  Result Value Ref  Range   Glucose 145 (H) 70 - 99 mg/dL   BUN 7 (L) 8 - 27 mg/dL   Creatinine, Ser 9.02 0.57 - 1.00 mg/dL   eGFR 65 >40 fO/fpw/8.26   BUN/Creatinine Ratio 7 (L) 12 - 28   Sodium 140 134 - 144 mmol/L   Potassium 4.6 3.5 - 5.2 mmol/L   Chloride 101 96 - 106 mmol/L   CO2 22 20 - 29 mmol/L   Calcium  9.9 8.7 - 10.3 mg/dL   Total Protein 7.4 6.0 - 8.5 g/dL   Albumin 4.6 3.9 - 4.9 g/dL   Globulin, Total 2.8 1.5 - 4.5 g/dL   Bilirubin Total 0.7 0.0 - 1.2 mg/dL   Alkaline Phosphatase 91 44 - 121 IU/L   AST 19 0 - 40 IU/L   ALT 17 0 - 32 IU/L  TSH     Status: Abnormal   Collection Time: 10/03/23  8:34 AM  Result Value Ref Range   TSH 25.900 (H) 0.450 - 4.500 uIU/mL  Hemoglobin A1c     Status: Abnormal   Collection Time: 10/03/23  8:34 AM  Result Value Ref Range   Hgb A1c MFr Bld 6.1 (H) 4.8 - 5.6 %    Comment:          Prediabetes: 5.7 - 6.4          Diabetes: >6.4          Glycemic control for adults with diabetes: <7.0    Est. average glucose Bld gHb Est-mCnc 128 mg/dL  Vitamin B12     Status: None   Collection Time: 10/03/23  8:34 AM  Result Value Ref Range   Vitamin B-12 503 232 - 1,245 pg/mL  Cortisol Dexamethasone  Reflex     Status: None   Collection Time: 10/03/23  8:34 AM  Result Value Ref Range   Cortisol, Serum LCMS 1.5 ug/dL    Comment: A serum cortisol level greater than 1.8 ug/dL after the single-dose overnight dexamethasone  is considered a positive test.  Refer to the [facility] End[facility] Syllabus for more detail.  This test was developed and its performance characteristics determined by [facility]. It has not been cleared or approved by the Food and Drug Administration. Reference Range: <1.8    Dexamethasone  Reflex Comment     Comment: Dexamethasone  testing not applicable  CBC  with Diff     Status: Abnormal   Collection Time: 10/03/23  8:34 AM  Result Value Ref Range   WBC 7.9 3.4 - 10.8 x10E3/uL    Comment: **Effective October 14, 2023 profile [id number] WBC will be made**   non-orderable as a stand-alone order code.    RBC 5.00 3.77 - 5.28 x10E6/uL   Hemoglobin 15.4 11.1 - 15.9 g/dL   Hematocrit 52.6 (H) 65.9 - 46.6 %   MCV 95 79 - 97 fL   MCH 30.8 26.6 - 33.0 pg   MCHC 32.6 31.5 - 35.7 g/dL   RDW 86.6 88.2 - 84.5 %   Platelets 336 150 - 450 x10E3/uL   Neutrophils 87 Not Estab. %   Lymphs 12 Not Estab. %   Monocytes 1 Not Estab. %   Eos 0 Not Estab. %   Basos 0 Not Estab. %   Neutrophils Absolute 6.8 1.4 - 7.0 x10E3/uL   Lymphocytes Absolute 0.9 0.7 - 3.1 x10E3/uL   Monocytes Absolute 0.1 0.1 - 0.9 x10E3/uL   EOS (ABSOLUTE) 0.0 0.0 - 0.4 x10E3/uL   Basophils Absolute 0.0 0.0 - 0.2 x10E3/uL  Immature Granulocytes 0 Not Estab. %   Immature Grans (Abs) 0.0 0.0 - 0.1 x10E3/uL  Hepatitis C Ab reflex to Quant PCR     Status: None   Collection Time: 10/03/23  8:34 AM  Result Value Ref Range   HCV Ab Non Reactive Non Reactive  Iron, TIBC and Ferritin Panel     Status: None   Collection Time: 10/03/23  8:34 AM  Result Value Ref Range   Total Iron Binding Capacity 384 250 - 450 ug/dL   UIBC 682 881 - 630 ug/dL   Iron 67 27 - 860 ug/dL   Iron Saturation 17 15 - 55 %   Ferritin 23 15 - 150 ng/mL  Interpretation:     Status: None   Collection Time: 10/03/23  8:34 AM  Result Value Ref Range   HCV Interp 1: Comment     Comment: Not infected with HCV unless early or acute infection is suspected (which may be delayed in an immunocompromised individual), or other evidence exists to indicate HCV infection.   Ova and parasite examination     Status: None   Collection Time: 10/03/23  9:04 AM   Specimen: Stool   ST  Result Value Ref Range   OVA + PARASITE EXAM Final report     Comment: These results were obtained using wet preparation(s) and  trichrome stained smear. This test does not include testing for Cryptosporidium parvum, Cyclospora, or Microsporidia.    O&P result 1 Comment     Comment: No ova, cysts, or parasites seen. One negative specimen does not rule out the possibility of a parasitic infection.   Pancreatic Elastase, Fecal     Status: Abnormal   Collection Time: 10/03/23  9:06 AM  Result Value Ref Range   Pancreatic Elastase, Fecal 50 (L) >200 ug Elast./g    Comment:        Severe Pancreatic Insufficiency:          <100        Moderate Pancreatic Insufficiency:   100 - 200        Normal:                                   >200   Culture, Stool     Status: None   Collection Time: 10/03/23  9:08 AM   Specimen: Stool   ST  Result Value Ref Range   Salmonella/Shigella Screen Final report    Stool Culture result 1 (RSASHR) Comment     Comment: No Salmonella or Shigella recovered.   Campylobacter Culture Final report    Stool Culture result 1 (CMPCXR) Comment     Comment: No Campylobacter species isolated.   E coli, Shiga toxin Assay Negative Negative  TSH+T4F+T3Free     Status: Abnormal   Collection Time: 10/18/23 12:02 PM  Result Value Ref Range   TSH 41.100 (H) 0.450 - 4.500 uIU/mL   T3, Free 1.8 (L) 2.0 - 4.4 pg/mL   Free T4 0.87 0.82 - 1.77 ng/dL  POC CREATINE & ALBUMIN,URINE     Status: None   Collection Time: 11/19/23 10:52 AM  Result Value Ref Range   Microalbumin Ur, POC 80 mg/L   Creatinine, POC 300 mg/dL   Albumin/Creatinine Ratio, Urine, POC 30-300   POCT Urinalysis Dipstick (18997)     Status: Abnormal   Collection Time: 11/19/23 10:52 AM  Result Value Ref Range  Color, UA orange    Clarity, UA cloudy    Glucose, UA Negative Negative   Bilirubin, UA small    Ketones, UA trace    Spec Grav, UA 1.020 1.010 - 1.025   Blood, UA trace    pH, UA 5.5 5.0 - 8.0   Protein, UA Positive (A) Negative   Urobilinogen, UA 0.2 0.2 or 1.0 E.U./dL   Nitrite, UA pos    Leukocytes, UA Small (1+) (A)  Negative   Appearance cloudy    Odor yes        Assessment & Plan:   Problem List Items Addressed This Visit       Endocrine   Hypothyroidism   Relevant Orders   TSH+T4F+T3Free   Diabetic polyneuropathy associated with type 2 diabetes mellitus (HCC) - Primary   Relevant Medications   DULoxetine  (CYMBALTA ) 30 MG capsule   rosuvastatin  (CRESTOR ) 20 MG tablet   Other Relevant Orders   POC CREATINE & ALBUMIN,URINE (Completed)   Other Visit Diagnoses       Dysuria       Urine had foul odor, so we did UA in office today. Grossly abnormal. Sending abx and sending for UA/UC.   Relevant Orders   POCT Urinalysis Dipstick (81002) (Completed)   Urinalysis, Routine w reflex microscopic   Urine Culture       Return in about 1 month (around 12/20/2023) for F/U.   Total time spent: 20 minutes  ALAN CHRISTELLA ARRANT, FNP  11/19/2023   This document may have been prepared by [facility] Voice Recognition software and as such may include unintentional dictation errors.

## 2023-11-20 LAB — URINALYSIS, ROUTINE W REFLEX MICROSCOPIC
Bilirubin, UA: NEGATIVE
Glucose, UA: NEGATIVE
Nitrite, UA: NEGATIVE
RBC, UA: NEGATIVE
Specific Gravity, UA: 1.023 (ref 1.005–1.030)
Urobilinogen, Ur: 0.2 mg/dL (ref 0.2–1.0)
pH, UA: 5.5 (ref 5.0–7.5)

## 2023-11-20 LAB — TSH+T4F+T3FREE
Free T4: 1.18 ng/dL (ref 0.82–1.77)
T3, Free: 2.4 pg/mL (ref 2.0–4.4)
TSH: 49.9 u[IU]/mL — ABNORMAL HIGH (ref 0.450–4.500)

## 2023-11-20 LAB — MICROSCOPIC EXAMINATION
RBC, Urine: NONE SEEN /[HPF] (ref 0–2)
WBC, UA: >30 /[HPF] — AB (ref 0–5)

## 2023-11-21 LAB — URINE CULTURE

## 2023-11-22 ENCOUNTER — Other Ambulatory Visit: Payer: Self-pay

## 2023-11-22 MED ORDER — LEVOTHYROXINE SODIUM 150 MCG PO TABS: 150.0000 ug | ORAL_TABLET | Freq: Every day | ORAL | 11 refills | Status: DC

## 2023-11-22 NOTE — Progress Notes (Signed)
 Patient informed via mychart

## 2023-11-29 DIAGNOSIS — E6609 Other obesity due to excess calories: Secondary | ICD-10-CM | POA: Diagnosis not present

## 2023-11-29 DIAGNOSIS — Z6832 Body mass index (BMI) 32.0-32.9, adult: Secondary | ICD-10-CM | POA: Diagnosis not present

## 2023-11-29 DIAGNOSIS — M5416 Radiculopathy, lumbar region: Secondary | ICD-10-CM | POA: Diagnosis not present

## 2023-11-29 DIAGNOSIS — Z79899 Other long term (current) drug therapy: Secondary | ICD-10-CM | POA: Diagnosis not present

## 2023-11-29 DIAGNOSIS — G8929 Other chronic pain: Secondary | ICD-10-CM | POA: Diagnosis not present

## 2023-12-03 DIAGNOSIS — Z79899 Other long term (current) drug therapy: Secondary | ICD-10-CM | POA: Diagnosis not present

## 2023-12-20 ENCOUNTER — Ambulatory Visit: Payer: Medicare HMO | Admitting: Family

## 2023-12-30 DIAGNOSIS — Z0000 Encounter for general adult medical examination without abnormal findings: Secondary | ICD-10-CM | POA: Diagnosis not present

## 2023-12-30 DIAGNOSIS — Z79899 Other long term (current) drug therapy: Secondary | ICD-10-CM | POA: Diagnosis not present

## 2023-12-30 DIAGNOSIS — Z6833 Body mass index (BMI) 33.0-33.9, adult: Secondary | ICD-10-CM | POA: Diagnosis not present

## 2023-12-30 DIAGNOSIS — E6609 Other obesity due to excess calories: Secondary | ICD-10-CM | POA: Diagnosis not present

## 2023-12-30 DIAGNOSIS — M5416 Radiculopathy, lumbar region: Secondary | ICD-10-CM | POA: Diagnosis not present

## 2023-12-30 DIAGNOSIS — G8929 Other chronic pain: Secondary | ICD-10-CM | POA: Diagnosis not present

## 2024-01-01 DIAGNOSIS — Z79899 Other long term (current) drug therapy: Secondary | ICD-10-CM | POA: Diagnosis not present

## 2024-01-02 ENCOUNTER — Ambulatory Visit: Payer: Medicare HMO | Admitting: Family

## 2024-01-07 ENCOUNTER — Encounter: Payer: Self-pay | Admitting: Family

## 2024-01-07 ENCOUNTER — Ambulatory Visit (INDEPENDENT_AMBULATORY_CARE_PROVIDER_SITE_OTHER): Payer: Medicare HMO | Admitting: Family

## 2024-01-07 VITALS — BP 128/86 | HR 99 | Ht 60.0 in | Wt 165.6 lb

## 2024-01-07 DIAGNOSIS — Z0130 Encounter for examination of blood pressure without abnormal findings: Secondary | ICD-10-CM

## 2024-01-07 DIAGNOSIS — J069 Acute upper respiratory infection, unspecified: Secondary | ICD-10-CM | POA: Diagnosis not present

## 2024-01-07 DIAGNOSIS — E559 Vitamin D deficiency, unspecified: Secondary | ICD-10-CM | POA: Diagnosis not present

## 2024-01-07 DIAGNOSIS — E039 Hypothyroidism, unspecified: Secondary | ICD-10-CM | POA: Diagnosis not present

## 2024-01-07 DIAGNOSIS — U071 COVID-19: Secondary | ICD-10-CM

## 2024-01-07 LAB — POCT XPERT XPRESS SARS COVID-2/FLU/RSV
FLU A: NEGATIVE
FLU B: NEGATIVE
RSV RNA, PCR: NEGATIVE
SARS Coronavirus 2: POSITIVE

## 2024-01-07 MED ORDER — PAXLOVID (300/100) 20 X 150 MG & 10 X 100MG PO TBPK: ORAL_TABLET | ORAL | 0 refills | Status: DC

## 2024-01-07 NOTE — Progress Notes (Signed)
 Established Patient Office Visit  Subjective:  Patient ID: Evelyn Bentley, female    DOB: May 23, 1956  Age: 68 y.o. MRN: [id number]  Chief Complaint  Patient presents with   Follow-up    URI  Associated symptoms include chest pain.  Palpitations  This is a new problem. The current episode started 1 to 4 weeks ago. The problem occurs 2 to 4 times per day. The problem has been waxing and waning. Associated symptoms include chest pain. Associated symptoms comments: Tremors. She has tried bed rest and breathing exercises for the symptoms. The treatment provided no relief. Risk factors include obesity, post menopause and dyslipidemia.     No other concerns at this time.   Past Medical History:  Diagnosis Date   Abnormal drug screen (03/04/2023) 03/25/2023   (03/04/2023) UDS (+) for carboxy-THC (marijuana)      Abnormal MRI, cervical spine (05/10/2021) 03/25/2023   (05/10/2021) CERVICAL MRI FINDINGS:  Cord: Mild cord hyperintensity at C5, unchanged.     DISC LEVELS:  C2-3: Mild left foraminal narrowing due to facet hypertrophy  C3-4: Small central disc protrusion unchanged. Mild spinal stenosis. Bilateral facet degeneration.  C4-5: ACDF with anterior plate. Mild left foraminal narrowing.  C5-6: Solid interbody fusion without plate.  C6-7: Solid interbody fusi   Abnormal MRI, lumbar spine (12/11/2022) 03/04/2023   (12/11/2022) LUMBAR MRI FINDINGS:  Alignment:  Mild degenerative anterolisthesis at L3-4 and L4-5     DISC LEVELS:  T12- L1: Mild progression of chronic central protrusion.  L1-2: Small left paracentral protrusion with mild interval regression.  L2-3: Disc bulging eccentric to the left with inferior foraminal protrusion. Mild facet spurring. Mild left foraminal narrowing.  L3-4: Degenerative facet    Abnormal NCS (nerve conduction studies) 03/04/2023   Upper extremity EMG IMPRESSION: Abnormal study. 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      There is an electrodiagnostic and sonographic evidence of bilateral median neuropathies at the wrists (i.e carpal tunnel syndrome) and electrodiagnostic and s   Anxiety    Arthritis    Bursitis    Rt hip   Cervical facet joint pain 03/04/2023   Closed fracture of patella 05/06/2017   Complication of anesthesia    a.) PONV. 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   Family history of adverse reaction to anesthesia    a.) mother - severe PONV   Fibromyalgia    GERD (gastroesophageal reflux disease)    H/O arthritis 03/06/2015   H/O: hypothyroidism 03/06/2015   History of 2019 novel coronavirus disease (COVID-19) 12/20/2020   a.) preoperative PCR (+) on 12/20/2020; retested following day per pt/surgeon request; PCR once again (+) on 12/21/2020   History of carpal tunnel release of wrists (Bilateral) 03/04/2023   History of fusion of cervical spine (C4-C7) 03/04/2023   History of kidney stones    History of marijuana use 03/25/2023   (03/04/2023) UDS (+) for carboxy-THC (marijuana)      History of migraine headaches 05/02/2015   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   Hypercholesteremia    Hypertension    Hypothyroidism    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   Knee pain 05/06/2017   PONV (postoperative nausea and vomiting)    Pre-diabetes    Sepsis due to urinary tract infection (HCC) 07/02/2016   Sleep apnea  MILD- DOESN'T NEED cpap   Status post total hip replacement, 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   Thyroid  disease    Weakness of limb 05/06/2017    Past Surgical History:  Procedure Laterality Date   ABDOMINAL HYSTERECTOMY     ANTERIOR INTEROSSEOUS NERVE DECOMPRESSION Left 01/05/2021   Procedure: Cubital tunnel release, left;  Surgeon: Molli Angelucci, MD;  Location: [facility];  Service: Orthopedics;   Laterality: Left;   CARPAL TUNNEL RELEASE Right 09/01/2020   Procedure: Right carpal tunnel release;  Surgeon: Molli Angelucci, MD;  Location: [facility];  Service: Orthopedics;  Laterality: Right;   CARPAL TUNNEL RELEASE Left 01/05/2021   Procedure: Carpal tunnel release, left;  Surgeon: Molli Angelucci, MD;  Location: [facility];  Service: Orthopedics;  Laterality: Left;   CATARACT EXTRACTION, BILATERAL     CHOLECYSTECTOMY     COLONOSCOPY WITH PROPOFOL  N/A 11/13/2016   Procedure: COLONOSCOPY WITH PROPOFOL ;  Surgeon: Deveron Fly, MD;  Location: [facility] ENDOSCOPY;  Service: Endoscopy;  Laterality: N/A;   ESOPHAGOGASTRODUODENOSCOPY (EGD) WITH PROPOFOL  N/A 11/13/2016   Procedure: ESOPHAGOGASTRODUODENOSCOPY (EGD) WITH PROPOFOL ;  Surgeon: Deveron Fly, MD;  Location: [facility] ENDOSCOPY;  Service: Endoscopy;  Laterality: N/A;   ESOPHAGOGASTRODUODENOSCOPY (EGD) WITH PROPOFOL  N/A 12/16/2018   Procedure: ESOPHAGOGASTRODUODENOSCOPY (EGD) WITH PROPOFOL ;  Surgeon: Deveron Fly, MD;  Location: [facility] ENDOSCOPY;  Service: Endoscopy;  Laterality: N/A;   fundoflication  2015   at [facility]   LAPAROSCOPIC HYSTERECTOMY Bilateral 02/28/2016   Procedure: HYSTERECTOMY TOTAL LAPAROSCOPIC / BSO;  Surgeon: Margarie Shay Ward, MD;  Location: [facility];  Service: Gynecology;  Laterality: Bilateral;   NECK SURGERY     X 2   NISSEN FUNDOPLICATION     TOTAL HIP ARTHROPLASTY Right 01/02/2022   Procedure: TOTAL HIP ARTHROPLASTY ANTERIOR APPROACH;  Surgeon: Molli Angelucci, MD;  Location: [facility];  Service: Orthopedics;  Laterality: Right;   TUBAL LIGATION     URETEROSCOPY WITH HOLMIUM LASER LITHOTRIPSY Left 07/03/2016   Procedure: URETEROSCOPY WITH HOLMIUM LASER LITHOTRIPSY;  Surgeon: Rea Cambridge, MD;  Location: [facility];  Service: Urology;  Laterality: Left;    Social History   Socioeconomic History   Marital status: Widowed    Spouse name: Not on file   Number of children: 2   Years of education: Not on file    Highest education level: High school graduate  Occupational History    Comment: fulltime  Tobacco Use   Smoking status: Former    Current packs/day: 0.00    Types: Cigarettes    Start date: 11/14/1974    Quit date: 05/14/2014    Years since quitting: 9.8   Smokeless tobacco: Never  Vaping Use   Vaping status: Every Day   Substances: Nicotine  Substance and Sexual Activity   Alcohol  use: Not Currently    Alcohol /week: 0.0 - 1.0 standard drinks of alcohol    Drug use: No   Sexual activity: Yes    Partners: Male    Birth control/protection: Condom  Other Topics Concern   Not on file  Social History Narrative   Not on file   Social Drivers of Health   Financial Resource Strain: Low Risk  (10/18/2023)   Overall Financial Resource Strain (CARDIA)    Difficulty of Paying Living Expenses: Not very hard  Food Insecurity: No Food Insecurity (10/18/2023)   Hunger Vital Sign    Worried About Running Out of Food in the Last Year: Never true    Ran Out of Food in the Last Year: Never true  Transportation Needs: No Transportation Needs (10/18/2023)   PRAPARE - Administrator, Civil Service (Medical): No    Lack of Transportation (Non-Medical): No  Physical Activity: Inactive (10/18/2023)   Exercise Vital Sign    Days of Exercise per Week: 0 days    Minutes of Exercise per Session: 0 min  Stress: Stress Concern Present (10/18/2023)   [employer] of Occupational Health - Occupational Stress Questionnaire    Feeling of Stress : Very much  Social Connections: Socially Isolated (10/18/2023)   Social Connection and Isolation Panel [NHANES]    Frequency of Communication with Friends and Family: Never    Frequency of Social Gatherings with Friends and Family: More than three times a week    Attends Religious Services: Never    Database administrator or Organizations: No    Attends Banker Meetings: Never    Marital Status: Widowed  Intimate Partner Violence: Not  At Risk (10/18/2023)   Humiliation, Afraid, Rape, and Kick questionnaire    Fear of Current or Ex-Partner: No    Emotionally Abused: No    Physically Abused: No    Sexually Abused: No    Family History  Problem Relation Age of Onset   Depression Mother    Anxiety disorder Mother    Breast cancer Neg Hx     Allergies  Allergen Reactions   Codeine Nausea Only    Review of Systems  Cardiovascular:  Positive for chest pain and palpitations.       Objective:   BP 128/86   Pulse 99   Ht 5' (1.524 m)   Wt 165 lb 9.6 oz (75.1 kg)   SpO2 98%   BMI 32.34 kg/m   Vitals:   01/07/24 0920  BP: 128/86  Pulse: 99  Height: 5' (1.524 m)  Weight: 165 lb 9.6 oz (75.1 kg)  SpO2: 98%  BMI (Calculated): 32.34    Physical Exam   Results for orders placed or performed in visit on 01/07/24  TSH+T4F+T3Free  Result Value Ref Range   TSH 0.025 (L) 0.450 - 4.500 uIU/mL   T3, Free 4.4 2.0 - 4.4 pg/mL   Free T4 2.39 (H) 0.82 - 1.77 ng/dL  POCT XPERT XPRESS SARS COVID-2/FLU/RSV  Result Value Ref Range   SARS Coronavirus 2 Positive    FLU A Negative    FLU B Negative    RSV RNA, PCR Negative     Recent Results (from the past 2160 hours)  TSH+T4F+T3Free     Status: Abnormal   Collection Time: 01/07/24 10:14 AM  Result Value Ref Range   TSH 0.025 (L) 0.450 - 4.500 uIU/mL   T3, Free 4.4 2.0 - 4.4 pg/mL   Free T4 2.39 (H) 0.82 - 1.77 ng/dL  POCT XPERT XPRESS SARS COVID-2/FLU/RSV     Status: None   Collection Time: 01/07/24 10:57 AM  Result Value Ref Range   SARS Coronavirus 2 Positive    FLU A Negative    FLU B Negative    RSV RNA, PCR Negative   POCT Urinalysis Dipstick (16109)     Status: Abnormal   Collection Time: 01/22/24  3:17 PM  Result Value Ref Range   Color, UA YELLOW    Clarity, UA DARK    Glucose, UA Negative Negative   Bilirubin, UA 1+    Ketones, UA POSITIVE    Spec Grav, UA >=1.030 (A) 1.010 - 1.025   Blood, UA 3+    pH, UA  5.5 5.0 - 8.0   Protein, UA  Positive (A) Negative   Urobilinogen, UA 0.2 0.2 or 1.0 E.U./dL   Nitrite, UA NEGATIVE    Leukocytes, UA Small (1+) (A) Negative   Appearance DARK    Odor NO   Urinalysis, Routine w reflex microscopic     Status: Abnormal   Collection Time: 01/22/24  4:10 PM  Result Value Ref Range   Specific Gravity, UA      >=1.030 (A) 1.005 - 1.030   pH, UA 5.5 5.0 - 7.5   Color, UA Yellow Yellow   Appearance Ur Turbid (A) Clear   Leukocytes,UA 2+ (A) Negative   Protein,UA 2+ (A) Negative/Trace   Glucose, UA Negative Negative   Ketones, UA Trace (A) Negative   RBC, UA 3+ (A) Negative   Bilirubin, UA CANCELED     Comment: Test not performed. Unable to perform test due to current unavailability of reagents.  Result canceled by the ancillary.    Urobilinogen, Ur 1.0 0.2 - 1.0 mg/dL   Nitrite, UA Negative Negative   Microscopic Examination See below:     Comment: Microscopic was indicated and was performed.  Urine Culture     Status: Abnormal   Collection Time: 01/22/24  4:10 PM   Specimen: Urine, Clean Catch   UR  Result Value Ref Range   Urine Culture, Routine Final report (A)    Organism ID, Bacteria Escherichia coli (A)     Comment: Cefazolin  with an MIC <=16 predicts susceptibility to the oral agents cefaclor, cefdinir, cefpodoxime, cefprozil, cefuroxime, cephalexin, and loracarbef when used for therapy of uncomplicated urinary tract infections due to E. coli, Klebsiella pneumoniae, and Proteus mirabilis. 50,000-100,000 colony forming units per mL    Antimicrobial Susceptibility Comment     Comment:       ** S = Susceptible; I = Intermediate; R = Resistant **                    P = Positive; N = Negative             MICS are expressed in micrograms per mL    Antibiotic                 RSLT#1    RSLT#2    RSLT#3    RSLT#4 Amoxicillin /Clavulanic Acid    S Ampicillin                     S Cefazolin                       S Cefepime                       S Cefoxitin                       S Cefpodoxime                    S Ceftriaxone                     S Ciprofloxacin                   R Ertapenem                      S Gentamicin   S Levofloxacin                   R Meropenem                      S Nitrofurantoin                  S Piperacillin/Tazobactam        S Tetracycline                   S Tobramycin                     S Trimethoprim /Sulfa              S   Microscopic Examination     Status: Abnormal   Collection Time: 01/22/24  4:10 PM  Result Value Ref Range   WBC, UA >30 (A) 0 - 5 /hpf   RBC, Urine >30 (A) 0 - 2 /hpf   Epithelial Cells (non renal) 0-10 0 - 10 /hpf   Casts None seen None seen /lpf   Bacteria, UA None seen None seen/Few  Lipid panel     Status: Abnormal   Collection Time: 02/21/24 11:16 AM  Result Value Ref Range   Cholesterol, Total 98 (L) 100 - 199 mg/dL   Triglycerides 90 0 - 149 mg/dL   HDL 36 (L) >16 mg/dL   VLDL Cholesterol Cal 18 5 - 40 mg/dL   LDL Chol Calc (NIH) 44 0 - 99 mg/dL   Chol/HDL Ratio 2.7 0.0 - 4.4 ratio    Comment:                                   T. Chol/HDL Ratio                                             Men  Women                               1/2 Avg.Risk  3.4    3.3                                   Avg.Risk  5.0    4.4                                2X Avg.Risk  9.6    7.1                                3X Avg.Risk 23.4   11.0   VITAMIN D  25 Hydroxy (Vit-D Deficiency, Fractures)     Status: None   Collection Time: 02/21/24 11:16 AM  Result Value Ref Range   Vit D, 25-Hydroxy 37.4 30.0 - 100.0 ng/mL    Comment: Vitamin D  deficiency has been defined by the Institute of Medicine and an Endocrine Society practice guideline as a level of serum 25-OH vitamin D  less than 20 ng/mL (1,2). The Endocrine Society went on to further define vitamin D  insufficiency as a  level between 21 and 29 ng/mL (2). 1. IOM (Institute of Medicine). 2010. Dietary reference    intakes for calcium  and D.  [place]: The    [employer]. 2. Holick MF, Binkley [place], Bischoff-Ferrari HA, et al.    Evaluation, treatment, and prevention of vitamin D     deficiency: an Endocrine Society clinical practice    guideline. JCEM. 2011 Jul; 96(7):1911-30.   CMP14+EGFR     Status: Abnormal   Collection Time: 02/21/24 11:16 AM  Result Value Ref Range   Glucose 100 (H) 70 - 99 mg/dL   BUN 11 8 - 27 mg/dL   Creatinine, Ser 1.61 0.57 - 1.00 mg/dL   eGFR 96 >09 UE/AVW/0.98   BUN/Creatinine Ratio 16 12 - 28   Sodium 140 134 - 144 mmol/L   Potassium 4.8 3.5 - 5.2 mmol/L   Chloride 103 96 - 106 mmol/L   CO2 24 20 - 29 mmol/L   Calcium  9.6 8.7 - 10.3 mg/dL   Total Protein 6.3 6.0 - 8.5 g/dL   Albumin 4.1 3.9 - 4.9 g/dL   Globulin, Total 2.2 1.5 - 4.5 g/dL   Bilirubin Total 0.7 0.0 - 1.2 mg/dL   Alkaline Phosphatase 84 44 - 121 IU/L   AST 23 0 - 40 IU/L   ALT 12 0 - 32 IU/L  Hemoglobin A1c     Status: Abnormal   Collection Time: 02/21/24 11:16 AM  Result Value Ref Range   Hgb A1c MFr Bld 5.7 (H) 4.8 - 5.6 %    Comment:          Prediabetes: 5.7 - 6.4          Diabetes: >6.4          Glycemic control for adults with diabetes: <7.0    Est. average glucose Bld gHb Est-mCnc 117 mg/dL  Vitamin B12     Status: None   Collection Time: 02/21/24 11:16 AM  Result Value Ref Range   Vitamin B-12 430 232 - 1,245 pg/mL  TSH+T4F+T3Free     Status: Abnormal   Collection Time: 02/21/24 11:16 AM  Result Value Ref Range   TSH <0.005 (L) 0.450 - 4.500 uIU/mL   T3, Free 4.7 (H) 2.0 - 4.4 pg/mL   Free T4 2.55 (H) 0.82 - 1.77 ng/dL  CBC with Diff     Status: None   Collection Time: 02/21/24 11:16 AM  Result Value Ref Range   WBC 7.6 3.4 - 10.8 x10E3/uL   RBC 4.54 3.77 - 5.28 x10E6/uL   Hemoglobin 13.7 11.1 - 15.9 g/dL   Hematocrit 11.9 14.7 - 46.6 %   MCV 90 79 - 97 fL   MCH 30.2 26.6 - 33.0 pg   MCHC 33.4 31.5 - 35.7 g/dL   RDW 82.9 56.2 - 13.0 %   Platelets 271 150 - 450 x10E3/uL    Neutrophils 69 Not Estab. %   Lymphs 19 Not Estab. %   Monocytes 9 Not Estab. %   Eos 2 Not Estab. %   Basos 1 Not Estab. %   Neutrophils Absolute 5.2 1.4 - 7.0 x10E3/uL   Lymphocytes Absolute 1.5 0.7 - 3.1 x10E3/uL   Monocytes Absolute 0.7 0.1 - 0.9 x10E3/uL   EOS (ABSOLUTE) 0.2 0.0 - 0.4 x10E3/uL   Basophils Absolute 0.1 0.0 - 0.2 x10E3/uL   Immature Granulocytes 0 Not Estab. %   Immature Grans (Abs) 0.0 0.0 - 0.1 x10E3/uL       Assessment & Plan:  Problem List Items Addressed This Visit       Endocrine   Hypothyroidism   Rechecking levels today.  Will call with results and instructions to adjust meds.       Relevant Orders   TSH+T4F+T3Free (Completed)     Other   Vitamin D  insufficiency   Checking labs today.  Will continue supplements as needed.        Other Visit Diagnoses       Upper respiratory tract infection, unspecified type    -  Primary   Checking COVID/Flu/RSV test in office.  Will call patient with results and send meds.   Relevant Orders   POCT XPERT XPRESS SARS COVID-2/FLU/RSV (Completed)        Return in about 2 weeks (around 01/21/2024) for F/U.   Total time spent: 30 minutes  Trenda Frisk, FNP  01/07/2024   This document may have been prepared by [facility] Voice Recognition software and as such may include unintentional dictation errors.

## 2024-01-08 LAB — TSH+T4F+T3FREE
Free T4: 2.39 ng/dL — ABNORMAL HIGH (ref 0.82–1.77)
T3, Free: 4.4 pg/mL (ref 2.0–4.4)
TSH: 0.025 u[IU]/mL — ABNORMAL LOW (ref 0.450–4.500)

## 2024-01-21 ENCOUNTER — Encounter: Payer: Self-pay | Admitting: Family

## 2024-01-21 ENCOUNTER — Ambulatory Visit (INDEPENDENT_AMBULATORY_CARE_PROVIDER_SITE_OTHER): Payer: Medicare HMO | Admitting: Family

## 2024-01-21 VITALS — BP 124/86 | HR 91 | Ht 60.0 in | Wt 164.8 lb

## 2024-01-21 DIAGNOSIS — E039 Hypothyroidism, unspecified: Secondary | ICD-10-CM

## 2024-01-21 DIAGNOSIS — G9589 Other specified diseases of spinal cord: Secondary | ICD-10-CM

## 2024-01-21 DIAGNOSIS — F331 Major depressive disorder, recurrent, moderate: Secondary | ICD-10-CM

## 2024-01-21 DIAGNOSIS — E1142 Type 2 diabetes mellitus with diabetic polyneuropathy: Secondary | ICD-10-CM

## 2024-01-21 DIAGNOSIS — R002 Palpitations: Secondary | ICD-10-CM

## 2024-01-21 DIAGNOSIS — Z0130 Encounter for examination of blood pressure without abnormal findings: Secondary | ICD-10-CM

## 2024-01-21 MED ORDER — LEVOTHYROXINE SODIUM 150 MCG PO TABS: 150.0000 ug | ORAL_TABLET | ORAL | 3 refills | Status: DC

## 2024-01-21 MED ORDER — LEVOTHYROXINE SODIUM 125 MCG PO TABS: 125.0000 ug | ORAL_TABLET | ORAL | 3 refills | Status: DC

## 2024-01-21 NOTE — Progress Notes (Signed)
 Established Patient Office Visit  Subjective:  Patient ID: Evelyn Bentley, female    DOB: 06/08/1956  Age: 68 y.o. MRN: [id number]  Chief Complaint  Patient presents with   Follow-up    2 week follow up    Patient is here for her follow up appointment.   She has been having continued issues with fatigue, and says that she has been having severe issues with palpitations.  These occur frequently enough to be affecting her daily functioning.   She is due for a recheck of her thyroid labs today.      No other concerns at this time.   Past Medical History:  Diagnosis Date   Abnormal drug screen (03/04/2023) 03/25/2023   (03/04/2023) UDS (+) for carboxy-THC (marijuana)      Abnormal MRI, cervical spine (05/10/2021) 03/25/2023   (05/10/2021) CERVICAL MRI FINDINGS:  Cord: Mild cord hyperintensity at C5, unchanged.     DISC LEVELS:  C2-3: Mild left foraminal narrowing due to facet hypertrophy  C3-4: Small central disc protrusion unchanged. Mild spinal stenosis. Bilateral facet degeneration.  C4-5: ACDF with anterior plate. Mild left foraminal narrowing.  C5-6: Solid interbody fusion without plate.  C6-7: Solid interbody fusi   Abnormal MRI, lumbar spine (12/11/2022) 03/04/2023   (12/11/2022) LUMBAR MRI FINDINGS:  Alignment:  Mild degenerative anterolisthesis at L3-4 and L4-5     DISC LEVELS:  T12- L1: Mild progression of chronic central protrusion.  L1-2: Small left paracentral protrusion with mild interval regression.  L2-3: Disc bulging eccentric to the left with inferior foraminal protrusion. Mild facet spurring. Mild left foraminal narrowing.  L3-4: Degenerative facet    Abnormal NCS (nerve conduction studies) 03/04/2023   Upper extremity EMG IMPRESSION: Abnormal study. 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      There is an electrodiagnostic  and sonographic evidence of bilateral median neuropathies at the wrists (i.e carpal tunnel syndrome) and electrodiagnostic and s   Anxiety    Arthritis    Bursitis    Rt hip   Cervical facet joint pain 03/04/2023   Closed fracture of patella 05/06/2017   Complication of anesthesia    a.) PONV. 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   Family history of adverse reaction to anesthesia    a.) mother - severe PONV   Fibromyalgia    GERD (gastroesophageal reflux disease)    H/O arthritis 03/06/2015   H/O: hypothyroidism 03/06/2015   History of 2019 novel coronavirus disease (COVID-19) 12/20/2020   a.) preoperative PCR (+) on 12/20/2020; retested following day per pt/surgeon request; PCR once again (+) on 12/21/2020   History of carpal tunnel release of wrists (Bilateral) 03/04/2023   History of fusion of cervical spine (C4-C7) 03/04/2023   History of kidney stones    History of marijuana use 03/25/2023   (03/04/2023) UDS (+) for carboxy-THC (marijuana)      History of migraine headaches 05/02/2015   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   Hypercholesteremia    Hypertension    Hypothyroidism    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   Knee pain 05/06/2017   PONV (postoperative nausea and vomiting)    Pre-diabetes    Sepsis due to urinary tract infection (HCC) 07/02/2016   Sleep apnea    MILD- DOESN'T NEED cpap   Status post total hip replacement, 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   Thyroid disease    Weakness of limb 05/06/2017    Past Surgical History:  Procedure Laterality Date   ABDOMINAL HYSTERECTOMY     ANTERIOR INTEROSSEOUS NERVE DECOMPRESSION Left 01/05/2021   Procedure: Cubital tunnel release, left;  Surgeon: Kennedy Bucker, MD;  Location: [facility];  Service: Orthopedics;  Laterality: Left;   CARPAL TUNNEL RELEASE Right 09/01/2020   Procedure: Right carpal tunnel release;  Surgeon: Kennedy Bucker, MD;   Location: [facility];  Service: Orthopedics;  Laterality: Right;   CARPAL TUNNEL RELEASE Left 01/05/2021   Procedure: Carpal tunnel release, left;  Surgeon: Kennedy Bucker, MD;  Location: [facility];  Service: Orthopedics;  Laterality: Left;   CATARACT EXTRACTION, BILATERAL     CHOLECYSTECTOMY     COLONOSCOPY WITH PROPOFOL N/A 11/13/2016   Procedure: COLONOSCOPY WITH PROPOFOL;  Surgeon: Christena Deem, MD;  Location: [facility] END[facility];  Service: Endoscopy;  Laterality: N/A;   ESOPHAGOGASTRODUODENOSCOPY (EGD) WITH PROPOFOL N/A 11/13/2016   Procedure: ESOPHAGOGASTRODUODENOSCOPY (EGD) WITH PROPOFOL;  Surgeon: Christena Deem, MD;  Location: [facility] ENDOSCOPY;  Service: Endoscopy;  Laterality: N/A;   ESOPHAGOGASTRODUODENOSCOPY (EGD) WITH PROPOFOL N/A 12/16/2018   Procedure: ESOPHAGOGASTRODUODENOSCOPY (EGD) WITH PROPOFOL;  Surgeon: Christena Deem, MD;  Location: [facility] ENDOSCOPY;  Service: Endoscopy;  Laterality: N/A;   fundoflication  2015   at [facility] hospital   LAPAROSCOPIC HYSTERECTOMY Bilateral 02/28/2016   Procedure: HYSTERECTOMY TOTAL LAPAROSCOPIC / BSO;  Surgeon: Elenora Fender Ward, MD;  Location: [facility];  Service: Gynecology;  Laterality: Bilateral;   NECK SURGERY     X 2   NISSEN FUNDOPLICATION     TOTAL HIP ARTHROPLASTY Right 01/02/2022   Procedure: TOTAL HIP ARTHROPLASTY ANTERIOR APPROACH;  Surgeon: Kennedy Bucker, MD;  Location: [facility];  Service: Orthopedics;  Laterality: Right;   TUBAL LIGATION     URETEROSCOPY WITH HOLMIUM LASER LITHOTRIPSY Left 07/03/2016   Procedure: URETEROSCOPY WITH HOLMIUM LASER LITHOTRIPSY;  Surgeon: Orson Ape, MD;  Location: [facility];  Service: Urology;  Laterality: Left;    Social History   Socioeconomic History   Marital status: Widowed    Spouse name: Not on file   Number of children: 2   Years of education: Not on file   Highest education level: High school graduate  Occupational History    Comment: fulltime  Tobacco Use   Smoking status: Former     Current packs/day: 0.00    Types: Cigarettes    Start date: 11/14/1974    Quit date: 05/14/2014    Years since quitting: 9.7   Smokeless tobacco: Never  Vaping Use   Vaping status: Every Day   Substances: Nicotine  Substance and Sexual Activity   Alcohol use: Not Currently    Alcohol/week: 0.0 - 1.0 standard drinks of alcohol   Drug use: No   Sexual activity: Yes    Partners: Male    Birth control/protection: Condom  Other Topics Concern   Not on file  Social History Narrative   Not on file   Social Drivers of Health   Financial Resource Strain: Low Risk  (10/18/2023)   Overall Financial Resource Strain (CARDIA)    Difficulty of Paying Living Expenses: Not very hard  Food Insecurity: No Food Insecurity (10/18/2023)   Hunger Vital Sign    Worried About Running Out of Food in the Last Year: Never true    Ran Out of Food in the Last Year: Never true  Transportation Needs: No Transportation Needs (10/18/2023)   PRAPARE - Transportation  Lack of Transportation (Medical): No    Lack of Transportation (Non-Medical): No  Physical Activity: Inactive (10/18/2023)   Exercise Vital Sign    Days of Exercise per Week: 0 days    Minutes of Exercise per Session: 0 min  Stress: Stress Concern Present (10/18/2023)   [employer] of Occupational Health - Occupational Stress Questionnaire    Feeling of Stress : Very much  Social Connections: Socially Isolated (10/18/2023)   Social Connection and Isolation Panel [NHANES]    Frequency of Communication with Friends and Family: Never    Frequency of Social Gatherings with Friends and Family: More than three times a week    Attends Religious Services: Never    Database administrator or Organizations: No    Attends Banker Meetings: Never    Marital Status: Widowed  Intimate Partner Violence: Not At Risk (10/18/2023)   Humiliation, Afraid, Rape, and Kick questionnaire    Fear of Current or Ex-Partner: No    Emotionally Abused:  No    Physically Abused: No    Sexually Abused: No    Family History  Problem Relation Age of Onset   Depression Mother    Anxiety disorder Mother    Breast cancer Neg Hx     Allergies  Allergen Reactions   Codeine Nausea Only    Review of Systems  Constitutional:  Positive for malaise/fatigue.  Cardiovascular:  Positive for palpitations.  Neurological:  Positive for dizziness.  All other systems reviewed and are negative.      Objective:   BP 124/86   Pulse 91   Ht 5' (1.524 m)   Wt 164 lb 12.8 oz (74.8 kg)   SpO2 97%   BMI 32.19 kg/m   Vitals:   01/21/24 1121  BP: 124/86  Pulse: 91  Height: 5' (1.524 m)  Weight: 164 lb 12.8 oz (74.8 kg)  SpO2: 97%  BMI (Calculated): 32.19    Physical Exam Vitals and nursing note reviewed.  Constitutional:      Appearance: Normal appearance. She is normal weight.  HENT:     Head: Normocephalic.  Eyes:     Extraocular Movements: Extraocular movements intact.     Conjunctiva/sclera: Conjunctivae normal.     Pupils: Pupils are equal, round, and reactive to light.  Cardiovascular:     Rate and Rhythm: Normal rate and regular rhythm.     Pulses: Normal pulses.  Pulmonary:     Effort: Pulmonary effort is normal.  Neurological:     General: No focal deficit present.     Mental Status: She is alert and oriented to person, place, and time. Mental status is at baseline.  Psychiatric:        Mood and Affect: Mood normal.        Behavior: Behavior normal.        Thought Content: Thought content normal.        Judgment: Judgment normal.      No results found for any visits on 01/21/24.  Recent Results (from the past 2160 hours)  Urine Culture     Status: Abnormal   Collection Time: 11/19/23  4:13 AM   Specimen: Urine, Clean Catch   UC  Result Value Ref Range   Urine Culture, Routine Final report (A)    Organism ID, Bacteria Escherichia coli (A)     Comment: Cefazolin <=4 ug/mL Cefazolin with an MIC <=16 predicts  susceptibility to the oral agents cefaclor, cefdinir, cefpodoxime, cefprozil, cefuroxime, cephalexin, and  loracarbef when used for therapy of uncomplicated urinary tract infections due to E. coli, Klebsiella pneumoniae, and Proteus mirabilis. Greater than 100,000 colony forming units per mL    ORGANISM ID, BACTERIA Not applicable    Antimicrobial Susceptibility Comment     Comment:       ** S = Susceptible; I = Intermediate; R = Resistant **                    P = Positive; N = Negative             MICS are expressed in micrograms per mL    Antibiotic                 RSLT#1    RSLT#2    RSLT#3    RSLT#4 Amoxicillin/Clavulanic Acid    S Ampicillin                     S Cefepime                       S Ceftriaxone                    S Cefuroxime                     S Ciprofloxacin                  S Ertapenem                      S Gentamicin                     S Imipenem                       S Levofloxacin                   S Meropenem                      S Nitrofurantoin                 S Piperacillin/Tazobactam        S Tetracycline                   S Tobramycin                     S Trimethoprim/Sulfa             S   Urinalysis, Routine w reflex microscopic     Status: Abnormal   Collection Time: 11/19/23  4:15 AM  Result Value Ref Range   Specific Gravity, UA 1.023 1.005 - 1.030   pH, UA 5.5 5.0 - 7.5   Color, UA Yellow Yellow   Appearance Ur Cloudy (A) Clear   Leukocytes,UA 1+ (A) Negative   Protein,UA 1+ (A) Negative/Trace   Glucose, UA Negative Negative   Ketones, UA Trace (A) Negative   RBC, UA Negative Negative   Bilirubin, UA Negative Negative   Urobilinogen, Ur 0.2 0.2 - 1.0 mg/dL   Nitrite, UA Negative Negative   Microscopic Examination See below:     Comment: Microscopic was indicated and was performed.  Microscopic Examination     Status: Abnormal   Collection Time: 11/19/23  4:15 AM  Result Value Ref Range   WBC, UA >  30 (A) 0 - 5 /hpf   RBC, Urine  None seen 0 - 2 /hpf   Epithelial Cells (non renal) 0-10 0 - 10 /hpf   Casts Present (A) None seen /lpf   Cast Type Hyaline casts N/A   Bacteria, UA Many (A) None seen/Few  TSH+T4F+T3Free     Status: Abnormal   Collection Time: 11/19/23 10:52 AM  Result Value Ref Range   TSH 49.900 (H) 0.450 - 4.500 uIU/mL   T3, Free 2.4 2.0 - 4.4 pg/mL   Free T4 1.18 0.82 - 1.77 ng/dL  POC CREATINE & ALBUMIN,URINE     Status: None   Collection Time: 11/19/23 10:52 AM  Result Value Ref Range   Microalbumin Ur, POC 80 mg/L   Creatinine, POC 300 mg/dL   Albumin/Creatinine Ratio, Urine, POC 30-300   POCT Urinalysis Dipstick (40981)     Status: Abnormal   Collection Time: 11/19/23 10:52 AM  Result Value Ref Range   Color, UA orange    Clarity, UA cloudy    Glucose, UA Negative Negative   Bilirubin, UA small    Ketones, UA trace    Spec Grav, UA 1.020 1.010 - 1.025   Blood, UA trace    pH, UA 5.5 5.0 - 8.0   Protein, UA Positive (A) Negative   Urobilinogen, UA 0.2 0.2 or 1.0 E.U./dL   Nitrite, UA pos    Leukocytes, UA Small (1+) (A) Negative   Appearance cloudy    Odor yes   TSH+T4F+T3Free     Status: Abnormal   Collection Time: 01/07/24 10:14 AM  Result Value Ref Range   TSH 0.025 (L) 0.450 - 4.500 uIU/mL   T3, Free 4.4 2.0 - 4.4 pg/mL   Free T4 2.39 (H) 0.82 - 1.77 ng/dL  POCT XPERT XPRESS SARS COVID-2/FLU/RSV     Status: None   Collection Time: 01/07/24 10:57 AM  Result Value Ref Range   SARS Coronavirus 2 Positive    FLU A Negative    FLU B Negative    RSV RNA, PCR Negative   POCT Urinalysis Dipstick (19147)     Status: Abnormal   Collection Time: 01/22/24  3:17 PM  Result Value Ref Range   Color, UA YELLOW    Clarity, UA DARK    Glucose, UA Negative Negative   Bilirubin, UA 1+    Ketones, UA POSITIVE    Spec Grav, UA >=1.030 (A) 1.010 - 1.025   Blood, UA 3+    pH, UA 5.5 5.0 - 8.0   Protein, UA Positive (A) Negative   Urobilinogen, UA 0.2 0.2 or 1.0 E.U./dL   Nitrite, UA  NEGATIVE    Leukocytes, UA Small (1+) (A) Negative   Appearance DARK    Odor NO   Urinalysis, Routine w reflex microscopic     Status: Abnormal   Collection Time: 01/22/24  4:10 PM  Result Value Ref Range   Specific Gravity, UA      >=1.030 (A) 1.005 - 1.030   pH, UA 5.5 5.0 - 7.5   Color, UA Yellow Yellow   Appearance Ur Turbid (A) Clear   Leukocytes,UA 2+ (A) Negative   Protein,UA 2+ (A) Negative/Trace   Glucose, UA Negative Negative   Ketones, UA Trace (A) Negative   RBC, UA 3+ (A) Negative   Bilirubin, UA CANCELED     Comment: Test not performed. Unable to perform test due to current unavailability of reagents.  Result canceled by the ancillary.  Urobilinogen, Ur 1.0 0.2 - 1.0 mg/dL   Nitrite, UA Negative Negative   Microscopic Examination See below:     Comment: Microscopic was indicated and was performed.  Urine Culture     Status: Abnormal   Collection Time: 01/22/24  4:10 PM   Specimen: Urine, Clean Catch   UR  Result Value Ref Range   Urine Culture, Routine Final report (A)    Organism ID, Bacteria Escherichia coli (A)     Comment: Cefazolin with an MIC <=16 predicts susceptibility to the oral agents cefaclor, cefdinir, cefpodoxime, cefprozil, cefuroxime, cephalexin, and loracarbef when used for therapy of uncomplicated urinary tract infections due to E. coli, Klebsiella pneumoniae, and Proteus mirabilis. 50,000-100,000 colony forming units per mL    Antimicrobial Susceptibility Comment     Comment:       ** S = Susceptible; I = Intermediate; R = Resistant **                    P = Positive; N = Negative             MICS are expressed in micrograms per mL    Antibiotic                 RSLT#1    RSLT#2    RSLT#3    RSLT#4 Amoxicillin/Clavulanic Acid    S Ampicillin                     S Cefazolin                      S Cefepime                       S Cefoxitin                      S Cefpodoxime                    S Ceftriaxone                     S Ciprofloxacin                  R Ertapenem                      S Gentamicin                     S Levofloxacin                   R Meropenem                      S Nitrofurantoin                 S Piperacillin/Tazobactam        S Tetracycline                   S Tobramycin                     S Trimethoprim/Sulfa             S   Microscopic Examination     Status: Abnormal   Collection Time: 01/22/24  4:10 PM  Result Value Ref Range   WBC, UA >30 (A) 0 - 5 /hpf   RBC, Urine >30 (  A) 0 - 2 /hpf   Epithelial Cells (non renal) 0-10 0 - 10 /hpf   Casts None seen None seen /lpf   Bacteria, UA None seen None seen/Few       Assessment & Plan:   Problem List Items Addressed This Visit       Endocrine   Hypothyroidism   Setting up referral to endocrinology for pt.  She is aware they will call for scheduling.       Relevant Medications   levothyroxine (SYNTHROID) 125 MCG tablet   levothyroxine (SYNTHROID) 150 MCG tablet   Other Relevant Orders   Ambulatory referral to Endocrinology   Other Visit Diagnoses       Palpitations    -  Primary   Given that patient is not currently having symptoms, suggest we set up for holter monitor.  Will call to schedule once this has been approved.   Relevant Orders   AMBULATORY REFERRAL FOR HOLTER MONITORING   LONG TERM MONITOR (3-14 DAYS)       Return to be scheduled once endocrine has seen patient.   Total time spent: 20 minutes  Miki Kins, FNP  01/21/2024   This document may have been prepared by [facility] Voice Recognition software and as such may include unintentional dictation errors.

## 2024-01-22 ENCOUNTER — Ambulatory Visit (INDEPENDENT_AMBULATORY_CARE_PROVIDER_SITE_OTHER): Admitting: Family

## 2024-01-22 DIAGNOSIS — R309 Painful micturition, unspecified: Secondary | ICD-10-CM

## 2024-01-22 DIAGNOSIS — R3915 Urgency of urination: Secondary | ICD-10-CM | POA: Diagnosis not present

## 2024-01-22 LAB — POCT URINALYSIS DIPSTICK
Glucose, UA: NEGATIVE
Ketones, UA: POSITIVE
Nitrite, UA: NEGATIVE
Protein, UA: POSITIVE — AB
Spec Grav, UA: >=1.03 — AB (ref 1.010–1.025)
Urobilinogen, UA: 0.2 U/dL
pH, UA: 5.5 (ref 5.0–8.0)

## 2024-01-22 NOTE — Addendum Note (Signed)
 Addended by: Grayling Congress on: 01/22/2024 04:09 PM   Modules accepted: Orders, Level of Service

## 2024-01-22 NOTE — Progress Notes (Signed)
   CHIEF COMPLAINT  UA/ only visit fot UTI     REASON FOR VISIT  Possible UTI, UA Visit Only      ASSESSMENT & PLAN Diagnoses and all orders for this visit:  Urgency of urination -     POCT Urinalysis Dipstick (60454)  Urination pain -     POCT Urinalysis Dipstick ([id number])     Patient notified.  Total time spent: 5 minutes  Miki Kins, FNP 01/22/2024

## 2024-01-23 ENCOUNTER — Other Ambulatory Visit: Payer: Self-pay

## 2024-01-23 DIAGNOSIS — N390 Urinary tract infection, site not specified: Secondary | ICD-10-CM

## 2024-01-23 LAB — MICROSCOPIC EXAMINATION
Bacteria, UA: NONE SEEN
Casts: NONE SEEN /LPF
RBC, Urine: >30 /HPF — AB (ref 0–2)
WBC, UA: >30 /HPF — AB (ref 0–5)

## 2024-01-23 LAB — URINALYSIS, ROUTINE W REFLEX MICROSCOPIC
Glucose, UA: NEGATIVE
Nitrite, UA: NEGATIVE
Specific Gravity, UA: >=1.03 — AB (ref 1.005–1.030)
Urobilinogen, Ur: 1 mg/dL (ref 0.2–1.0)
pH, UA: 5.5 (ref 5.0–7.5)

## 2024-01-23 MED ORDER — NITROFURANTOIN MONOHYD MACRO 100 MG PO CAPS: 100.0000 mg | ORAL_CAPSULE | Freq: Two times a day (BID) | ORAL | 0 refills | Status: AC

## 2024-01-24 LAB — URINE CULTURE

## 2024-01-27 ENCOUNTER — Other Ambulatory Visit: Payer: Self-pay

## 2024-01-27 MED ORDER — CIPROFLOXACIN HCL 500 MG PO TABS: 500.0000 mg | ORAL_TABLET | Freq: Two times a day (BID) | ORAL | 0 refills | Status: DC

## 2024-01-28 DIAGNOSIS — R7303 Prediabetes: Secondary | ICD-10-CM | POA: Diagnosis not present

## 2024-01-28 DIAGNOSIS — Z1159 Encounter for screening for other viral diseases: Secondary | ICD-10-CM | POA: Diagnosis not present

## 2024-01-28 DIAGNOSIS — E6609 Other obesity due to excess calories: Secondary | ICD-10-CM | POA: Diagnosis not present

## 2024-01-28 DIAGNOSIS — Z9181 History of falling: Secondary | ICD-10-CM | POA: Diagnosis not present

## 2024-01-28 DIAGNOSIS — E559 Vitamin D deficiency, unspecified: Secondary | ICD-10-CM | POA: Diagnosis not present

## 2024-01-28 DIAGNOSIS — G8929 Other chronic pain: Secondary | ICD-10-CM | POA: Diagnosis not present

## 2024-01-28 DIAGNOSIS — M129 Arthropathy, unspecified: Secondary | ICD-10-CM | POA: Diagnosis not present

## 2024-01-28 DIAGNOSIS — Z79899 Other long term (current) drug therapy: Secondary | ICD-10-CM | POA: Diagnosis not present

## 2024-01-28 DIAGNOSIS — M5416 Radiculopathy, lumbar region: Secondary | ICD-10-CM | POA: Diagnosis not present

## 2024-01-30 NOTE — Assessment & Plan Note (Signed)
 Setting up referral to endocrinology for pt.  She is aware they will call for scheduling.

## 2024-01-31 DIAGNOSIS — Z79899 Other long term (current) drug therapy: Secondary | ICD-10-CM | POA: Diagnosis not present

## 2024-02-05 ENCOUNTER — Ambulatory Visit

## 2024-02-05 DIAGNOSIS — R002 Palpitations: Secondary | ICD-10-CM | POA: Diagnosis not present

## 2024-02-10 ENCOUNTER — Encounter: Payer: Self-pay | Admitting: Family

## 2024-02-10 ENCOUNTER — Telehealth: Payer: Self-pay | Admitting: Family

## 2024-02-10 NOTE — Telephone Encounter (Signed)
 Called patient and left VM. Was calling because we have a recording of part of her holter monitor that shows her in [employer] yesterday around 11:45 and Evelyn Bentley wants to know what she was doing around that time, any vigorous activity or anything?

## 2024-02-19 DIAGNOSIS — R002 Palpitations: Secondary | ICD-10-CM | POA: Diagnosis not present

## 2024-02-21 ENCOUNTER — Ambulatory Visit (INDEPENDENT_AMBULATORY_CARE_PROVIDER_SITE_OTHER): Admitting: Family

## 2024-02-21 ENCOUNTER — Encounter: Payer: Self-pay | Admitting: Family

## 2024-02-21 VITALS — BP 124/78 | HR 93 | Ht 60.0 in | Wt 164.8 lb

## 2024-02-21 DIAGNOSIS — E782 Mixed hyperlipidemia: Secondary | ICD-10-CM

## 2024-02-21 DIAGNOSIS — E611 Iron deficiency: Secondary | ICD-10-CM

## 2024-02-21 DIAGNOSIS — E538 Deficiency of other specified B group vitamins: Secondary | ICD-10-CM

## 2024-02-21 DIAGNOSIS — E039 Hypothyroidism, unspecified: Secondary | ICD-10-CM

## 2024-02-21 DIAGNOSIS — K8681 Exocrine pancreatic insufficiency: Secondary | ICD-10-CM

## 2024-02-21 DIAGNOSIS — I4891 Unspecified atrial fibrillation: Secondary | ICD-10-CM

## 2024-02-21 DIAGNOSIS — E559 Vitamin D deficiency, unspecified: Secondary | ICD-10-CM | POA: Diagnosis not present

## 2024-02-21 DIAGNOSIS — I10 Essential (primary) hypertension: Secondary | ICD-10-CM

## 2024-02-21 DIAGNOSIS — R7303 Prediabetes: Secondary | ICD-10-CM | POA: Diagnosis not present

## 2024-02-21 NOTE — Progress Notes (Signed)
 Established Patient Office Visit  Subjective:  Patient ID: Evelyn Bentley, female    DOB: 1956-02-16  Age: 68 y.o. MRN: [id number]  Chief Complaint  Patient presents with   Follow-up    Discuss holter results    Patient is here today for her 1 month follow up.  She has been feeling poorly since last appointment.   She does have additional concerns to discuss today.  She has not yet heard from the endocrinologist yet, so I will provide her with the contact information for his office today.   We do not yet have the full report back for her Threasa Beards, as she just stopped on Wednesday.  They did contact us with a serious report of an approximately 4.5 minute episode of A fib.  She says that she was cleaning during the time when this occurred, and that she didn't feel anything.   Labs are due today. She needs refills.   I have reviewed her active problem list, medication list, allergies, notes from last encounter, lab results for her appointment today.      No other concerns at this time.   Past Medical History:  Diagnosis Date   Abnormal drug screen (03/04/2023) 03/25/2023   (03/04/2023) UDS (+) for carboxy-THC (marijuana)      Abnormal MRI, cervical spine (05/10/2021) 03/25/2023   (05/10/2021) CERVICAL MRI FINDINGS:  Cord: Mild cord hyperintensity at C5, unchanged.     DISC LEVELS:  C2-3: Mild left foraminal narrowing due to facet hypertrophy  C3-4: Small central disc protrusion unchanged. Mild spinal stenosis. Bilateral facet degeneration.  C4-5: ACDF with anterior plate. Mild left foraminal narrowing.  C5-6: Solid interbody fusion without plate.  C6-7: Solid interbody fusi   Abnormal MRI, lumbar spine (12/11/2022) 03/04/2023   (12/11/2022) LUMBAR MRI FINDINGS:  Alignment:  Mild degenerative anterolisthesis at L3-4 and L4-5     DISC LEVELS:  T12- L1: Mild progression of chronic central protrusion.  L1-2: Small left paracentral protrusion with mild interval regression.  L2-3: Disc  bulging eccentric to the left with inferior foraminal protrusion. Mild facet spurring. Mild left foraminal narrowing.  L3-4: Degenerative facet    Abnormal NCS (nerve conduction studies) 03/04/2023   Upper extremity EMG IMPRESSION: Abnormal study. 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      There is an electrodiagnostic and sonographic evidence of bilateral median neuropathies at the wrists (i.e carpal tunnel syndrome) and electrodiagnostic and s   Anxiety    Arthritis    Bursitis    Rt hip   Cervical facet joint pain 03/04/2023   Closed fracture of patella 05/06/2017   Complication of anesthesia    a.) PONV. 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   Family history of adverse reaction to anesthesia    a.) mother - severe PONV   Fibromyalgia    GERD (gastroesophageal reflux disease)    H/O arthritis 03/06/2015   H/O: hypothyroidism 03/06/2015   History of 2019 novel coronavirus disease (COVID-19) 12/20/2020   a.) preoperative PCR (+) on 12/20/2020; retested following day per pt/surgeon request; PCR once again (+) on 12/21/2020   History of carpal tunnel release of wrists (Bilateral) 03/04/2023   History of fusion of cervical spine (C4-C7) 03/04/2023   History of kidney stones    History of marijuana use 03/25/2023   (03/04/2023) UDS (+) for carboxy-THC (marijuana)  History of migraine headaches 05/02/2015   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   Hypercholesteremia    Hypertension    Hypothyroidism    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   Knee pain 05/06/2017   PONV (postoperative nausea and vomiting)    Pre-diabetes    Sepsis due to urinary tract infection (HCC) 07/02/2016   Sleep apnea    MILD- DOESN'T NEED cpap   Status post total hip  replacement, 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   Thyroid disease    Weakness of limb 05/06/2017    Past Surgical History:  Procedure Laterality Date   ABDOMINAL HYSTERECTOMY     ANTERIOR INTEROSSEOUS NERVE DECOMPRESSION Left 01/05/2021   Procedure: Cubital tunnel release, left;  Surgeon: Kennedy Bucker, MD;  Location: [facility] ORS;  Service: Orthopedics;  Laterality: Left;   CARPAL TUNNEL RELEASE Right 09/01/2020   Procedure: Right carpal tunnel release;  Surgeon: Kennedy Bucker, MD;  Location: [facility];  Service: Orthopedics;  Laterality: Right;   CARPAL TUNNEL RELEASE Left 01/05/2021   Procedure: Carpal tunnel release, left;  Surgeon: Kennedy Bucker, MD;  Location: [facility];  Service: Orthopedics;  Laterality: Left;   CATARACT EXTRACTION, BILATERAL     CHOLECYSTECTOMY     COLONOSCOPY WITH PROPOFOL N/A 11/13/2016   Procedure: COLONOSCOPY WITH PROPOFOL;  Surgeon: Christena Deem, MD;  Location: [facility] END[facility]COPY;  Service: Endoscopy;  Laterality: N/A;   ESOPHAGOGASTRODUODENOSCOPY (EGD) WITH PROPOFOL N/A 11/13/2016   Procedure: ESOPHAGOGASTRODUODENOSCOPY (EGD) WITH PROPOFOL;  Surgeon: Christena Deem, MD;  Location[facility] ENDOSCOPY;  Service: Endoscopy;  Laterality: N/A;   ESOPHAGOGASTRODUODENOSCOPY (EGD) WITH PROPOFOL N/A 12/16/2018   Procedure: ESOPHAGOGASTRODUODENOSCOPY (EGD) WITH PROPOFOL;  Surgeon: Christena Deem, MD;  Location: [facility] ENDOSCOPY;  Service: Endoscopy;  Laterality: N/A;   fundoflication  2015   at [facility]   LAPAROSCOPIC HYSTERECTOMY Bilateral 02/28/2016   Procedure: HYSTERECTOMY TOTAL LAPAROSCOPIC / BSO;  Surgeon: Elenora Fender Ward, MD;  Location: [facility];  Service: Gynecology;  Laterality: Bilateral;   NECK SURGERY     X 2   NISSEN FUNDOPLICATION     TOTAL HIP ARTHROPLASTY Right 01/02/2022   Procedure: TOTAL HIP ARTHROPLASTY ANTERIOR APPROACH;  Surgeon: Kennedy Bucker, MD;  Location: [facility];  Service: Orthopedics;  Laterality: Right;   TUBAL LIGATION     URETEROSCOPY WITH HOLMIUM  LASER LITHOTRIPSY Left 07/03/2016   Procedure: URETEROSCOPY WITH HOLMIUM LASER LITHOTRIPSY;  Surgeon: Orson Ape, MD;  Location: [facility];  Service: Urology;  Laterality: Left;    Social History   Socioeconomic History   Marital status: Widowed    Spouse name: Not on file   Number of children: 2   Years of education: Not on file   Highest education level: High school graduate  Occupational History    Comment: fulltime  Tobacco Use   Smoking status: Former    Current packs/day: 0.00    Types: Cigarettes    Start date: 11/14/1974    Quit date: 05/14/2014    Years since quitting: 9.7   Smokeless tobacco: Never  Vaping Use   Vaping status: Every Day   Substances: Nicotine  Substance and Sexual Activity   Alcohol use: Not Currently    Alcohol/week: 0.0 - 1.0 standard drinks of alcohol   Drug use: No   Sexual activity: Yes    Partners: Male    Birth control/protection: Condom  Other Topics Concern   Not on file  Social History Narrative   Not on file   Social Drivers  of Health   Financial Resource Strain: Low Risk  (10/18/2023)   Overall Financial Resource Strain (CARDIA)    Difficulty of Paying Living Expenses: Not very hard  Food Insecurity: No Food Insecurity (10/18/2023)   Hunger Vital Sign    Worried About Running Out of Food in the Last Year: Never true    Ran Out of Food in the Last Year: Never true  Transportation Needs: No Transportation Needs (10/18/2023)   PRAPARE - Administrator, Civil Service (Medical): No    Lack of Transportation (Non-Medical): No  Physical Activity: Inactive (10/18/2023)   Exercise Vital Sign    Days of Exercise per Week: 0 days    Minutes of Exercise per Session: 0 min  Stress: Stress Concern Present (10/18/2023)   [employer] of Occupational Health - Occupational Stress Questionnaire    Feeling of Stress : Very much  Social Connections: Socially Isolated (10/18/2023)   Social Connection and Isolation Panel [NHANES]     Frequency of Communication with Friends and Family: Never    Frequency of Social Gatherings with Friends and Family: More than three times a week    Attends Religious Services: Never    Database administrator or Organizations: No    Attends Banker Meetings: Never    Marital Status: Widowed  Intimate Partner Violence: Not At Risk (10/18/2023)   Humiliation, Afraid, Rape, and Kick questionnaire    Fear of Current or Ex-Partner: No    Emotionally Abused: No    Physically Abused: No    Sexually Abused: No    Family History  Problem Relation Age of Onset   Depression Mother    Anxiety disorder Mother    Breast cancer Neg Hx     Allergies  Allergen Reactions   Codeine Nausea Only    Review of Systems  Constitutional:  Positive for malaise/fatigue.  Cardiovascular:  Positive for palpitations.  All other systems reviewed and are negative.      Objective:   BP 124/78   Pulse 93   Ht 5' (1.524 m)   Wt 164 lb 12.8 oz (74.8 kg)   SpO2 94%   BMI 32.19 kg/m   Vitals:   02/21/24 1017  BP: 124/78  Pulse: 93  Height: 5' (1.524 m)  Weight: 164 lb 12.8 oz (74.8 kg)  SpO2: 94%  BMI (Calculated): 32.19    Physical Exam Vitals and nursing note reviewed.  Constitutional:      General: She is not in acute distress.    Appearance: Normal appearance. She is obese. She is ill-appearing. She is not toxic-appearing.  HENT:     Head: Normocephalic and atraumatic.  Eyes:     Extraocular Movements: Extraocular movements intact.     Conjunctiva/sclera: Conjunctivae normal.     Pupils: Pupils are equal, round, and reactive to light.  Cardiovascular:     Rate and Rhythm: Normal rate.  Pulmonary:     Effort: Pulmonary effort is normal.  Neurological:     General: No focal deficit present.     Mental Status: She is alert and oriented to person, place, and time. Mental status is at baseline.  Psychiatric:        Mood and Affect: Mood is depressed.        Speech:  Speech normal.        Behavior: Behavior normal. Behavior is cooperative.        Thought Content: Thought content normal.  No results found for any visits on 02/21/24.  Recent Results (from the past 2160 hours)  TSH+T4F+T3Free     Status: Abnormal   Collection Time: 01/07/24 10:14 AM  Result Value Ref Range   TSH 0.025 (L) 0.450 - 4.500 uIU/mL   T3, Free 4.4 2.0 - 4.4 pg/mL   Free T4 2.39 (H) 0.82 - 1.77 ng/dL  POCT XPERT XPRESS SARS COVID-2/FLU/RSV     Status: None   Collection Time: 01/07/24 10:57 AM  Result Value Ref Range   SARS Coronavirus 2 Positive    FLU A Negative    FLU B Negative    RSV RNA, PCR Negative   POCT Urinalysis Dipstick (66440)     Status: Abnormal   Collection Time: 01/22/24  3:17 PM  Result Value Ref Range   Color, UA YELLOW    Clarity, UA DARK    Glucose, UA Negative Negative   Bilirubin, UA 1+    Ketones, UA POSITIVE    Spec Grav, UA >=1.030 (A) 1.010 - 1.025   Blood, UA 3+    pH, UA 5.5 5.0 - 8.0   Protein, UA Positive (A) Negative   Urobilinogen, UA 0.2 0.2 or 1.0 E.U./dL   Nitrite, UA NEGATIVE    Leukocytes, UA Small (1+) (A) Negative   Appearance DARK    Odor NO   Urinalysis, Routine w reflex microscopic     Status: Abnormal   Collection Time: 01/22/24  4:10 PM  Result Value Ref Range   Specific Gravity, UA      >=1.030 (A) 1.005 - 1.030   pH, UA 5.5 5.0 - 7.5   Color, UA Yellow Yellow   Appearance Ur Turbid (A) Clear   Leukocytes,UA 2+ (A) Negative   Protein,UA 2+ (A) Negative/Trace   Glucose, UA Negative Negative   Ketones, UA Trace (A) Negative   RBC, UA 3+ (A) Negative   Bilirubin, UA CANCELED     Comment: Test not performed. Unable to perform test due to current unavailability of reagents.  Result canceled by the ancillary.    Urobilinogen, Ur 1.0 0.2 - 1.0 mg/dL   Nitrite, UA Negative Negative   Microscopic Examination See below:     Comment: Microscopic was indicated and was performed.  Urine Culture     Status:  Abnormal   Collection Time: 01/22/24  4:10 PM   Specimen: Urine, Clean Catch   UR  Result Value Ref Range   Urine Culture, Routine Final report (A)    Organism ID, Bacteria Escherichia coli (A)     Comment: Cefazolin with an MIC <=16 predicts susceptibility to the oral agents cefaclor, cefdinir, cefpodoxime, cefprozil, cefuroxime, cephalexin, and loracarbef when used for therapy of uncomplicated urinary tract infections due to E. coli, Klebsiella pneumoniae, and Proteus mirabilis. 50,000-100,000 colony forming units per mL    Antimicrobial Susceptibility Comment     Comment:       ** S = Susceptible; I = Intermediate; R = Resistant **                    P = Positive; N = Negative             MICS are expressed in micrograms per mL    Antibiotic                 RSLT#1    RSLT#2    RSLT#3    RSLT#4 Amoxicillin/Clavulanic Acid    S Ampicillin  S Cefazolin                      S Cefepime                       S Cefoxitin                      S Cefpodoxime                    S Ceftriaxone                    S Ciprofloxacin                  R Ertapenem                      S Gentamicin                     S Levofloxacin                   R Meropenem                      S Nitrofurantoin                 S Piperacillin/Tazobactam        S Tetracycline                   S Tobramycin                     S Trimethoprim/Sulfa             S   Microscopic Examination     Status: Abnormal   Collection Time: 01/22/24  4:10 PM  Result Value Ref Range   WBC, UA >30 (A) 0 - 5 /hpf   RBC, Urine >30 (A) 0 - 2 /hpf   Epithelial Cells (non renal) 0-10 0 - 10 /hpf   Casts None seen None seen /lpf   Bacteria, UA None seen None seen/Few       Assessment & Plan:   Problem List Items Addressed This Visit       Cardiovascular and Mediastinum   Benign essential hypertension   Relevant Orders   CMP14+EGFR   CBC with Diff     Digestive   Exocrine pancreatic  insufficiency   Relevant Orders   CMP14+EGFR   CBC with Diff     Endocrine   Hypothyroidism   Relevant Orders   CMP14+EGFR   TSH+T4F+T3Free   CBC with Diff     Other   Vitamin D insufficiency   Relevant Orders   VITAMIN D 25 Hydroxy (Vit-D Deficiency, Fractures)   CMP14+EGFR   CBC with Diff   Iron deficiency - Primary   Relevant Orders   CMP14+EGFR   CBC with Diff   Other Visit Diagnoses       Mixed hyperlipidemia       Checking labs today.  Continue current therapy for lipid control. Will modify as needed based on labwork results.   Relevant Orders   Lipid panel   CMP14+EGFR   CBC with Diff     B12 deficiency due to diet       Checking labs today.  Will continue supplements as needed.   Relevant Orders   CMP14+EGFR   Vitamin B12  CBC with Diff     Prediabetes       A1C is in prediabetic ranges. Patient counseled on dietary choices and verbalized understanding. Will reassess at follow up after next lab check.   Relevant Orders   CMP14+EGFR   Hemoglobin A1c   CBC with Diff     Atrial fibrillation, unspecified type [facility])       Sending patient to Cardiology for evaluation. Will await final report.   Relevant Orders   Ambulatory referral to Cardiology       Return in about 1 month (around 03/22/2024) for F/U.   Total time spent: 20 minutes  Miki Kins, FNP  02/21/2024   This document may have been prepared by [facility] Voice Recognition software and as such may include unintentional dictation errors.

## 2024-02-21 NOTE — Patient Instructions (Addendum)
 Dr Dierdre Searles - [phone]  Checked labs today - will call with results.

## 2024-02-22 LAB — CBC WITH DIFFERENTIAL/PLATELET
Basophils Absolute: 0.1 10*3/uL (ref 0.0–0.2)
Basos: 1 %
EOS (ABSOLUTE): 0.2 10*3/uL (ref 0.0–0.4)
Eos: 2 %
Hematocrit: 41 % (ref 34.0–46.6)
Hemoglobin: 13.7 g/dL (ref 11.1–15.9)
Immature Grans (Abs): 0 10*3/uL (ref 0.0–0.1)
Immature Granulocytes: 0 %
Lymphocytes Absolute: 1.5 10*3/uL (ref 0.7–3.1)
Lymphs: 19 %
MCH: 30.2 pg (ref 26.6–33.0)
MCHC: 33.4 g/dL (ref 31.5–35.7)
MCV: 90 fL (ref 79–97)
Monocytes Absolute: 0.7 10*3/uL (ref 0.1–0.9)
Monocytes: 9 %
Neutrophils Absolute: 5.2 10*3/uL (ref 1.4–7.0)
Neutrophils: 69 %
Platelets: 271 10*3/uL (ref 150–450)
RBC: 4.54 x10E6/uL (ref 3.77–5.28)
RDW: 12.3 % (ref 11.7–15.4)
WBC: 7.6 10*3/uL (ref 3.4–10.8)

## 2024-02-22 LAB — TSH+T4F+T3FREE
Free T4: 2.55 ng/dL — ABNORMAL HIGH (ref 0.82–1.77)
T3, Free: 4.7 pg/mL — ABNORMAL HIGH (ref 2.0–4.4)
TSH: <0.005 u[IU]/mL — ABNORMAL LOW (ref 0.450–4.500)

## 2024-02-22 LAB — CMP14+EGFR
ALT: 12 IU/L (ref 0–32)
AST: 23 IU/L (ref 0–40)
Albumin: 4.1 g/dL (ref 3.9–4.9)
Alkaline Phosphatase: 84 IU/L (ref 44–121)
BUN/Creatinine Ratio: 16 (ref 12–28)
BUN: 11 mg/dL (ref 8–27)
Bilirubin Total: 0.7 mg/dL (ref 0.0–1.2)
CO2: 24 mmol/L (ref 20–29)
Calcium: 9.6 mg/dL (ref 8.7–10.3)
Chloride: 103 mmol/L (ref 96–106)
Creatinine, Ser: 0.69 mg/dL (ref 0.57–1.00)
Globulin, Total: 2.2 g/dL (ref 1.5–4.5)
Glucose: 100 mg/dL — ABNORMAL HIGH (ref 70–99)
Potassium: 4.8 mmol/L (ref 3.5–5.2)
Sodium: 140 mmol/L (ref 134–144)
Total Protein: 6.3 g/dL (ref 6.0–8.5)
eGFR: 96 mL/min/{1.73_m2} (ref >59)

## 2024-02-22 LAB — LIPID PANEL
Chol/HDL Ratio: 2.7 ratio (ref 0.0–4.4)
Cholesterol, Total: 98 mg/dL — ABNORMAL LOW (ref 100–199)
HDL: 36 mg/dL — ABNORMAL LOW (ref >39)
LDL Chol Calc (NIH): 44 mg/dL (ref 0–99)
Triglycerides: 90 mg/dL (ref 0–149)
VLDL Cholesterol Cal: 18 mg/dL (ref 5–40)

## 2024-02-22 LAB — VITAMIN B12: Vitamin B-12: 430 pg/mL (ref 232–1245)

## 2024-02-22 LAB — VITAMIN D 25 HYDROXY (VIT D DEFICIENCY, FRACTURES): Vit D, 25-Hydroxy: 37.4 ng/mL (ref 30.0–100.0)

## 2024-02-22 LAB — HEMOGLOBIN A1C
Est. average glucose Bld gHb Est-mCnc: 117 mg/dL
Hgb A1c MFr Bld: 5.7 % — ABNORMAL HIGH (ref 4.8–5.6)

## 2024-02-25 ENCOUNTER — Other Ambulatory Visit: Payer: Self-pay

## 2024-02-25 MED ORDER — LEVOTHYROXINE SODIUM 100 MCG PO TABS: 100.0000 ug | ORAL_TABLET | Freq: Every day | ORAL | 11 refills | Status: DC

## 2024-02-27 ENCOUNTER — Other Ambulatory Visit: Payer: Self-pay

## 2024-02-27 MED ORDER — PROPRANOLOL HCL 10 MG PO TABS: 10.0000 mg | ORAL_TABLET | Freq: Two times a day (BID) | ORAL | 1 refills | Status: DC

## 2024-03-02 DIAGNOSIS — E6609 Other obesity due to excess calories: Secondary | ICD-10-CM | POA: Diagnosis not present

## 2024-03-02 DIAGNOSIS — Z79899 Other long term (current) drug therapy: Secondary | ICD-10-CM | POA: Diagnosis not present

## 2024-03-02 DIAGNOSIS — G8929 Other chronic pain: Secondary | ICD-10-CM | POA: Diagnosis not present

## 2024-03-02 DIAGNOSIS — R7303 Prediabetes: Secondary | ICD-10-CM | POA: Diagnosis not present

## 2024-03-02 DIAGNOSIS — M5416 Radiculopathy, lumbar region: Secondary | ICD-10-CM | POA: Diagnosis not present

## 2024-03-02 DIAGNOSIS — Z9181 History of falling: Secondary | ICD-10-CM | POA: Diagnosis not present

## 2024-03-04 DIAGNOSIS — Z79899 Other long term (current) drug therapy: Secondary | ICD-10-CM | POA: Diagnosis not present

## 2024-03-06 ENCOUNTER — Ambulatory Visit: Admitting: Cardiovascular Disease

## 2024-03-06 ENCOUNTER — Encounter: Payer: Self-pay | Admitting: Cardiovascular Disease

## 2024-03-06 VITALS — BP 122/68 | HR 90 | Ht 60.0 in | Wt 164.8 lb

## 2024-03-06 DIAGNOSIS — I5032 Chronic diastolic (congestive) heart failure: Secondary | ICD-10-CM

## 2024-03-06 DIAGNOSIS — I10 Essential (primary) hypertension: Secondary | ICD-10-CM

## 2024-03-06 DIAGNOSIS — R0602 Shortness of breath: Secondary | ICD-10-CM

## 2024-03-06 DIAGNOSIS — R42 Dizziness and giddiness: Secondary | ICD-10-CM | POA: Diagnosis not present

## 2024-03-06 DIAGNOSIS — Z8679 Personal history of other diseases of the circulatory system: Secondary | ICD-10-CM

## 2024-03-06 DIAGNOSIS — R002 Palpitations: Secondary | ICD-10-CM | POA: Diagnosis not present

## 2024-03-06 DIAGNOSIS — R0789 Other chest pain: Secondary | ICD-10-CM

## 2024-03-06 MED ORDER — PROPRANOLOL HCL 20 MG PO TABS: 20.0000 mg | ORAL_TABLET | Freq: Two times a day (BID) | ORAL | 11 refills | Status: AC

## 2024-03-06 MED ORDER — APIXABAN 5 MG PO TABS
5.0000 mg | ORAL_TABLET | Freq: Two times a day (BID) | ORAL | 2 refills | Status: DC
Start: 2024-03-06 — End: 2024-05-29

## 2024-03-06 NOTE — Progress Notes (Signed)
 Cardiology Office Note   Date:  03/06/2024   ID:  Evelyn Bentley, DOB 1956-01-04, MRN [id number]  PCP:  Trenda Frisk, FNP  Cardiologist:  Debborah Fairly, MD      History of Present Illness: Evelyn Bentley is a 68 y.o. female who presents for  Chief Complaint  Patient presents with   Follow-up    Abnormal holter results    10 YOWF presents with palpitation saying my heart beating out of chest. Alfred Ann, and had monitor placed showing afib.      Past Medical History:  Diagnosis Date   Abnormal drug screen (03/04/2023) 03/25/2023   (03/04/2023) UDS (+) for carboxy-THC (marijuana)      Abnormal MRI, cervical spine (05/10/2021) 03/25/2023   (05/10/2021) CERVICAL MRI FINDINGS:  Cord: Mild cord hyperintensity at C5, unchanged.     DISC LEVELS:  C2-3: Mild left foraminal narrowing due to facet hypertrophy  C3-4: Small central disc protrusion unchanged. Mild spinal stenosis. Bilateral facet degeneration.  C4-5: ACDF with anterior plate. Mild left foraminal narrowing.  C5-6: Solid interbody fusion without plate.  C6-7: Solid interbody fusi   Abnormal MRI, lumbar spine (12/11/2022) 03/04/2023   (12/11/2022) LUMBAR MRI FINDINGS:  Alignment:  Mild degenerative anterolisthesis at L3-4 and L4-5     DISC LEVELS:  T12- L1: Mild progression of chronic central protrusion.  L1-2: Small left paracentral protrusion with mild interval regression.  L2-3: Disc bulging eccentric to the left with inferior foraminal protrusion. Mild facet spurring. Mild left foraminal narrowing.  L3-4: Degenerative facet    Abnormal NCS (nerve conduction studies) 03/04/2023   Upper extremity EMG IMPRESSION: Abnormal study. 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      There is an electrodiagnostic and sonographic evidence of bilateral median neuropathies at the wrists (i.e  carpal tunnel syndrome) and electrodiagnostic and s   Anxiety    Arthritis    Bursitis    Rt hip   Cervical facet joint pain 03/04/2023   Closed fracture of patella 05/06/2017   Complication of anesthesia    a.) PONV. 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   Family history of adverse reaction to anesthesia    a.) mother - severe PONV   Fibromyalgia    GERD (gastroesophageal reflux disease)    H/O arthritis 03/06/2015   H/O: hypothyroidism 03/06/2015   History of 2019 novel coronavirus disease (COVID-19) 12/20/2020   a.) preoperative PCR (+) on 12/20/2020; retested following day per pt/surgeon request; PCR once again (+) on 12/21/2020   History of carpal tunnel release of wrists (Bilateral) 03/04/2023   History of fusion of cervical spine (C4-C7) 03/04/2023   History of kidney stones    History of marijuana use 03/25/2023   (03/04/2023) UDS (+) for carboxy-THC (marijuana)      History of migraine headaches 05/02/2015   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   Hypercholesteremia    Hypertension    Hypothyroidism    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   Knee pain 05/06/2017   PONV (postoperative nausea and vomiting)    Pre-diabetes    Sepsis due to urinary tract infection (HCC) 07/02/2016   Sleep apnea    MILD- DOESN'T NEED cpap   Status post total hip replacement, 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   Thyroid  disease    Weakness of limb 05/06/2017  Past Surgical History:  Procedure Laterality Date   ABDOMINAL HYSTERECTOMY     ANTERIOR INTEROSSEOUS NERVE DECOMPRESSION Left 01/05/2021   Procedure: Cubital tunnel release, left;  Surgeon: Molli Angelucci, MD;  Location: [facility];  Service: Orthopedics;  Laterality: Left;   CARPAL TUNNEL RELEASE Right 09/01/2020   Procedure: Right carpal tunnel release;  Surgeon: Molli Angelucci, MD;  Location: [facility];  Service: Orthopedics;  Laterality: Right;   CARPAL TUNNEL  RELEASE Left 01/05/2021   Procedure: Carpal tunnel release, left;  Surgeon: Molli Angelucci, MD;  Location: [facility];  Service: Orthopedics;  Laterality: Left;   CATARACT EXTRACTION, BILATERAL     CHOLECYSTECTOMY     COLONOSCOPY WITH PROPOFOL  N/A 11/13/2016   Procedure: COLONOSCOPY WITH PROPOFOL ;  Surgeon: Deveron Fly, MD;  Location: [facility]COPY;  Service: Endoscopy;  Laterality: N/A;   ESOPHAGOGASTRODUODENOSCOPY (EGD) WITH PROPOFOL  N/A 11/13/2016   Procedure: ESOPHAGOGASTRODUODENOSCOPY (EGD) WITH PROPOFOL ;  Surgeon: Deveron Fly, MD;  Location: [facility] ENDOSCOPY;  Service: Endoscopy;  Laterality: N/A;   ESOPHAGOGASTRODUODENOSCOPY (EGD) WITH PROPOFOL  N/A 12/16/2018   Procedure: ESOPHAGOGASTRODUODENOSCOPY (EGD) WITH PROPOFOL ;  Surgeon: Deveron Fly, MD;  Location: [facility] ENDOSCOPY;  Service: Endoscopy;  Laterality: N/A;   fundoflication  2015   at [facility] hospital   LAPAROSCOPIC HYSTERECTOMY Bilateral 02/28/2016   Procedure: HYSTERECTOMY TOTAL LAPAROSCOPIC / BSO;  Surgeon: Margarie Shay Ward, MD;  Location: [facility];  Service: Gynecology;  Laterality: Bilateral;   NECK SURGERY     X 2   NISSEN FUNDOPLICATION     TOTAL HIP ARTHROPLASTY Right 01/02/2022   Procedure: TOTAL HIP ARTHROPLASTY ANTERIOR APPROACH;  Surgeon: Molli Angelucci, MD;  Location: [facility];  Service: Orthopedics;  Laterality: Right;   TUBAL LIGATION     URETEROSCOPY WITH HOLMIUM LASER LITHOTRIPSY Left 07/03/2016   Procedure: URETEROSCOPY WITH HOLMIUM LASER LITHOTRIPSY;  Surgeon: Rea Cambridge, MD;  Location: [facility];  Service: Urology;  Laterality: Left;     Current Outpatient Medications  Medication Sig Dispense Refill   acetaminophen  (TYLENOL ) 650 MG CR tablet Take 1,950 mg by mouth every 8 (eight) hours as needed for pain.     apixaban (ELIQUIS) 5 MG TABS tablet Take 1 tablet (5 mg total) by mouth 2 (two) times daily. 60 tablet 2   chlorpheniramine (CHLOR-TRIMETON) 4 MG tablet Take 4 mg by mouth 2 (two) times  daily as needed for allergies.     DULoxetine  (CYMBALTA ) 30 MG capsule Take 1 capsule (30 mg total) by mouth daily. 90 capsule 3   gabapentin  (NEURONTIN ) 300 MG capsule Take 300 mg by mouth 3 (three) times daily.     levothyroxine  (SYNTHROID ) 100 MCG tablet Take 1 tablet (100 mcg total) by mouth daily. 30 tablet 11   lipase/protease/amylase (CREON ) 36000 UNITS CPEP capsule Take 1 capsule with any snacks, and take 2 capsules with meals. 360 capsule 1   lisinopril -hydrochlorothiazide  (PRINZIDE ,ZESTORETIC ) 10-12.5 MG tablet Take 1 tablet by mouth daily.   2   naloxone (NARCAN) nasal spray 4 mg/0.1 mL Place 1 spray into the nose once.     ondansetron  (ZOFRAN ) 8 MG tablet Take 8 mg by mouth as needed.     oxyCODONE -acetaminophen  (PERCOCET/ROXICET) 5-325 MG tablet Take 1 tablet by mouth 3 (three) times daily as needed.     polyethylene glycol (MIRALAX  / GLYCOLAX ) 17 g packet Take 17 g by mouth daily as needed for mild constipation. 14 each 0   propranolol  (INDERAL ) 20 MG tablet Take 1 tablet (20 mg total) by mouth 2 (  two) times daily. 60 tablet 11   rosuvastatin  (CRESTOR ) 20 MG tablet Take 1 tablet (20 mg total) by mouth daily. 90 tablet 1   No current facility-administered medications for this visit.    Allergies:   Codeine    Social History:   reports that she quit smoking about 9 years ago. Her smoking use included cigarettes. She started smoking about 49 years ago. She has never used smokeless tobacco. She reports that she does not currently use alcohol . She reports that she does not use drugs.   Family History:  family history includes Anxiety disorder in her mother; Depression in her mother.    ROS:     Review of Systems  Constitutional: Negative.   HENT: Negative.    Eyes: Negative.   Respiratory: Negative.    Gastrointestinal: Negative.   Genitourinary: Negative.   Musculoskeletal: Negative.   Skin: Negative.   Neurological: Negative.   Endo/Heme/Allergies: Negative.    Psychiatric/Behavioral: Negative.    All other systems reviewed and are negative.     All other systems are reviewed and negative.    PHYSICAL EXAM: VS:  BP 122/68   Pulse 90   Ht 5' (1.524 m)   Wt 164 lb 12.8 oz (74.8 kg)   SpO2 96%   BMI 32.19 kg/m  , BMI Body mass index is 32.19 kg/m. Last weight:  Wt Readings from Last 3 Encounters:  03/06/24 164 lb 12.8 oz (74.8 kg)  02/21/24 164 lb 12.8 oz (74.8 kg)  01/21/24 164 lb 12.8 oz (74.8 kg)     Physical Exam Constitutional:      Appearance: Normal appearance.  Cardiovascular:     Rate and Rhythm: Normal rate and regular rhythm.     Heart sounds: Normal heart sounds.  Pulmonary:     Effort: Pulmonary effort is normal.     Breath sounds: Normal breath sounds.  Musculoskeletal:     Right lower leg: No edema.     Left lower leg: No edema.  Neurological:     Mental Status: She is alert.       EKG:   Recent Labs: 02/21/2024: ALT 12; BUN 11; Creatinine, Ser 0.69; Hemoglobin 13.7; Platelets 271; Potassium 4.8; Sodium 140; TSH <0.005    Lipid Panel    Component Value Date/Time   CHOL 98 (L) 02/21/2024 1116   TRIG 90 02/21/2024 1116   HDL 36 (L) 02/21/2024 1116   CHOLHDL 2.7 02/21/2024 1116   LDLCALC 44 02/21/2024 1116      Other studies Reviewed: Additional studies/ records that were reviewed today include:  Review of the above records demonstrates:       No data to display            ASSESSMENT AND PLAN:    ICD-10-CM   1. Atrial fibrillation, currently in sinus rhythm  Z86.79 propranolol  (INDERAL ) 20 MG tablet    apixaban (ELIQUIS) 5 MG TABS tablet    PCV ECHOCARDIOGRAM COMPLETE    MYOCARDIAL PERFUSION IMAGING   New onset afib, will pace on elliquis and increase propranalol dosage .Has hyperthyroidism as tremors and palpitation improving. AFIB VR 156/min. on monitor.    2. Benign essential hypertension  I10 propranolol  (INDERAL ) 20 MG tablet    apixaban (ELIQUIS) 5 MG TABS tablet    PCV  ECHOCARDIOGRAM COMPLETE    MYOCARDIAL PERFUSION IMAGING    3. Chronic diastolic congestive heart failure (HCC)  I50.32 propranolol  (INDERAL ) 20 MG tablet    apixaban (ELIQUIS) 5 MG  TABS tablet    PCV ECHOCARDIOGRAM COMPLETE    MYOCARDIAL PERFUSION IMAGING    4. SOB (shortness of breath)  R06.02 propranolol  (INDERAL ) 20 MG tablet    apixaban (ELIQUIS) 5 MG TABS tablet    PCV ECHOCARDIOGRAM COMPLETE    MYOCARDIAL PERFUSION IMAGING    5. Dizziness  R42 propranolol  (INDERAL ) 20 MG tablet    apixaban (ELIQUIS) 5 MG TABS tablet    PCV ECHOCARDIOGRAM COMPLETE    MYOCARDIAL PERFUSION IMAGING    6. Other chest pain  R07.89 propranolol  (INDERAL ) 20 MG tablet    apixaban (ELIQUIS) 5 MG TABS tablet    PCV ECHOCARDIOGRAM COMPLETE    MYOCARDIAL PERFUSION IMAGING   Has intermittant tightness in chest and will do stress test and echo    7. Palpitations  R00.2 propranolol  (INDERAL ) 20 MG tablet    apixaban (ELIQUIS) 5 MG TABS tablet    PCV ECHOCARDIOGRAM COMPLETE    MYOCARDIAL PERFUSION IMAGING       Problem List Items Addressed This Visit       Cardiovascular and Mediastinum   Benign essential hypertension   Relevant Medications   propranolol  (INDERAL ) 20 MG tablet   apixaban (ELIQUIS) 5 MG TABS tablet   Other Relevant Orders   PCV ECHOCARDIOGRAM COMPLETE   MYOCARDIAL PERFUSION IMAGING   Chronic diastolic congestive heart failure (HCC)   Relevant Medications   propranolol  (INDERAL ) 20 MG tablet   apixaban (ELIQUIS) 5 MG TABS tablet   Other Relevant Orders   PCV ECHOCARDIOGRAM COMPLETE   MYOCARDIAL PERFUSION IMAGING   Other Visit Diagnoses       Atrial fibrillation, currently in sinus rhythm    -  Primary   New onset afib, will pace on elliquis and increase propranalol dosage .Has hyperthyroidism as tremors and palpitation improving. AFIB VR 156/min. on monitor.   Relevant Medications   propranolol  (INDERAL ) 20 MG tablet   apixaban (ELIQUIS) 5 MG TABS tablet   Other Relevant  Orders   PCV ECHOCARDIOGRAM COMPLETE   MYOCARDIAL PERFUSION IMAGING     SOB (shortness of breath)       Relevant Medications   propranolol  (INDERAL ) 20 MG tablet   apixaban (ELIQUIS) 5 MG TABS tablet   Other Relevant Orders   PCV ECHOCARDIOGRAM COMPLETE   MYOCARDIAL PERFUSION IMAGING     Dizziness       Relevant Medications   propranolol  (INDERAL ) 20 MG tablet   apixaban (ELIQUIS) 5 MG TABS tablet   Other Relevant Orders   PCV ECHOCARDIOGRAM COMPLETE   MYOCARDIAL PERFUSION IMAGING     Other chest pain       Has intermittant tightness in chest and will do stress test and echo   Relevant Medications   propranolol  (INDERAL ) 20 MG tablet   apixaban (ELIQUIS) 5 MG TABS tablet   Other Relevant Orders   PCV ECHOCARDIOGRAM COMPLETE   MYOCARDIAL PERFUSION IMAGING     Palpitations       Relevant Medications   propranolol  (INDERAL ) 20 MG tablet   apixaban (ELIQUIS) 5 MG TABS tablet   Other Relevant Orders   PCV ECHOCARDIOGRAM COMPLETE   MYOCARDIAL PERFUSION IMAGING          Disposition:   Return in about 3 weeks (around 03/27/2024) for echo, stress test and f/u.    Total time spent: 50 minutes  Signed,  Debborah Fairly, MD  03/06/2024 10:52 AM    [facility]

## 2024-03-08 NOTE — Assessment & Plan Note (Signed)
 Checking labs today.  Will continue supplements as needed.

## 2024-03-08 NOTE — Assessment & Plan Note (Signed)
 Rechecking levels today.  Will call with results and instructions to adjust meds.

## 2024-03-16 ENCOUNTER — Other Ambulatory Visit: Payer: Self-pay

## 2024-03-16 ENCOUNTER — Ambulatory Visit

## 2024-03-16 DIAGNOSIS — I5032 Chronic diastolic (congestive) heart failure: Secondary | ICD-10-CM

## 2024-03-16 DIAGNOSIS — R0602 Shortness of breath: Secondary | ICD-10-CM

## 2024-03-16 DIAGNOSIS — I340 Nonrheumatic mitral (valve) insufficiency: Secondary | ICD-10-CM

## 2024-03-16 DIAGNOSIS — R0789 Other chest pain: Secondary | ICD-10-CM

## 2024-03-16 DIAGNOSIS — R42 Dizziness and giddiness: Secondary | ICD-10-CM

## 2024-03-16 DIAGNOSIS — R002 Palpitations: Secondary | ICD-10-CM

## 2024-03-16 DIAGNOSIS — I10 Essential (primary) hypertension: Secondary | ICD-10-CM

## 2024-03-16 DIAGNOSIS — Z8679 Personal history of other diseases of the circulatory system: Secondary | ICD-10-CM

## 2024-03-17 MED ORDER — DEXLANSOPRAZOLE 30 MG PO CPDR: 30.0000 mg | DELAYED_RELEASE_CAPSULE | Freq: Every day | ORAL | 5 refills | Status: DC

## 2024-03-23 ENCOUNTER — Ambulatory Visit: Admitting: Family

## 2024-03-24 ENCOUNTER — Encounter: Payer: Self-pay | Admitting: Family

## 2024-03-24 ENCOUNTER — Ambulatory Visit (INDEPENDENT_AMBULATORY_CARE_PROVIDER_SITE_OTHER): Admitting: Family

## 2024-03-24 VITALS — BP 108/78 | HR 66 | Ht 60.0 in | Wt 162.8 lb

## 2024-03-24 DIAGNOSIS — Z79899 Other long term (current) drug therapy: Secondary | ICD-10-CM | POA: Diagnosis not present

## 2024-03-24 DIAGNOSIS — Z0130 Encounter for examination of blood pressure without abnormal findings: Secondary | ICD-10-CM

## 2024-03-24 DIAGNOSIS — E039 Hypothyroidism, unspecified: Secondary | ICD-10-CM | POA: Diagnosis not present

## 2024-03-24 NOTE — Progress Notes (Signed)
   03/24/2024  Patient ID: Evelyn Bentley, female   DOB: 1955/11/24, 68 y.o.   MRN: [id number]  Patient qualifies for patient assistance for the following medications: Creon  Dexilant   Patient may qualify for Eliquis  as well.  Applications prepared in office and patient signed her portions. Patient also may qualify for LIS/Extra Help. Scheduled in office appt next week to assist in completing application.  Carnell Christian, PharmD Clinical Pharmacist [phone]

## 2024-03-24 NOTE — Progress Notes (Signed)
 Established Patient Office Visit  Subjective:  Patient ID: Evelyn Bentley, female    DOB: Jul 27, 1956  Age: 68 y.o. MRN: [id number]  Chief Complaint  Patient presents with   Follow-up    1 month follow up    Patient is here today for her 1 month follow up.  She has been feeling fairly well since last appointment.   She does have additional concerns to discuss today.  Still having significant fatigue.  Has adjusted her thyroid  meds.  She does have her appointment with endocrine.   Labs are due today. She needs refills.   I have reviewed her active problem list, medication list, allergies, notes from last encounter, lab results for her appointment today.     No other concerns at this time.   Past Medical History:  Diagnosis Date   Abnormal drug screen (03/04/2023) 03/25/2023   (03/04/2023) UDS (+) for carboxy-THC (marijuana)      Abnormal MRI, cervical spine (05/10/2021) 03/25/2023   (05/10/2021) CERVICAL MRI FINDINGS:  Cord: Mild cord hyperintensity at C5, unchanged.     DISC LEVELS:  C2-3: Mild left foraminal narrowing due to facet hypertrophy  C3-4: Small central disc protrusion unchanged. Mild spinal stenosis. Bilateral facet degeneration.  C4-5: ACDF with anterior plate. Mild left foraminal narrowing.  C5-6: Solid interbody fusion without plate.  C6-7: Solid interbody fusi   Abnormal MRI, lumbar spine (12/11/2022) 03/04/2023   (12/11/2022) LUMBAR MRI FINDINGS:  Alignment:  Mild degenerative anterolisthesis at L3-4 and L4-5     DISC LEVELS:  T12- L1: Mild progression of chronic central protrusion.  L1-2: Small left paracentral protrusion with mild interval regression.  L2-3: Disc bulging eccentric to the left with inferior foraminal protrusion. Mild facet spurring. Mild left foraminal narrowing.  L3-4: Degenerative facet    Abnormal NCS (nerve conduction studies) 03/04/2023   Upper extremity EMG IMPRESSION: Abnormal study. 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      There is an electrodiagnostic and sonographic evidence of bilateral median neuropathies at the wrists (i.e carpal tunnel syndrome) and electrodiagnostic and s   Anxiety    Arthritis    Bursitis    Rt hip   Cervical facet joint pain 03/04/2023   Closed fracture of patella 05/06/2017   Complication of anesthesia    a.) PONV. 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   Family history of adverse reaction to anesthesia    a.) mother - severe PONV   Fibromyalgia    GERD (gastroesophageal reflux disease)    H/O arthritis 03/06/2015   H/O: hypothyroidism 03/06/2015   History of 2019 novel coronavirus disease (COVID-19) 12/20/2020   a.) preoperative PCR (+) on 12/20/2020; retested following day per pt/surgeon request; PCR once again (+) on 12/21/2020   History of carpal tunnel release of wrists (Bilateral) 03/04/2023   History of fusion of cervical spine (C4-C7) 03/04/2023   History of kidney stones    History of marijuana use 03/25/2023   (03/04/2023) UDS (+) for carboxy-THC (marijuana)      History of migraine headaches 05/02/2015   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   Hypercholesteremia    Hypertension    Hypothyroidism    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   Knee pain 05/06/2017   PONV (postoperative nausea and vomiting)    Pre-diabetes    Sepsis  due to urinary tract infection (HCC) 07/02/2016   Sleep apnea    MILD- DOESN'T NEED cpap   Status post total hip replacement, 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   Thyroid  disease    Weakness of limb 05/06/2017    Past Surgical History:  Procedure Laterality Date   ABDOMINAL HYSTERECTOMY     ANTERIOR INTEROSSEOUS NERVE DECOMPRESSION Left 01/05/2021   Procedure: Cubital tunnel release, left;  Surgeon: Kathlynn Sharper, MD;  Location: [facility]  ORS;  Service: Orthopedics;  Laterality: Left;   CARPAL TUNNEL RELEASE Right 09/01/2020   Procedure: Right carpal tunnel release;  Surgeon: Kathlynn Sharper, MD;  Location: [facility] ORS;  Service: Orthopedics;  Laterality: Right;   CARPAL TUNNEL RELEASE Left 01/05/2021   Procedure: Carpal tunnel release, left;  Surgeon: Kathlynn Sharper, MD;  Location: [facility] ORS;  Service: Orthopedics;  Laterality: Left;   CATARACT EXTRACTION, BILATERAL     CHOLECYSTECTOMY     COLONOSCOPY WITH PROPOFOL  N/A 11/13/2016   Procedure: COLONOSCOPY WITH PROPOFOL ;  Surgeon: Gladis RAYMOND Mariner, MD;  Location: [facility] ENDOSCOPY;  Service: Endoscopy;  Laterality: N/A;   ESOPHAGOGASTRODUODENOSCOPY (EGD) WITH PROPOFOL  N/A 11/13/2016   Procedure: ESOPHAGOGASTRODUODENOSCOPY (EGD) WITH PROPOFOL ;  Surgeon: Gladis RAYMOND Mariner, MD;  Location: [facility] ENDOSCOPY;  Service: Endoscopy;  Laterality: N/A;   ESOPHAGOGASTRODUODENOSCOPY (EGD) WITH PROPOFOL  N/A 12/16/2018   Procedure: ESOPHAGOGASTRODUODENOSCOPY (EGD) WITH PROPOFOL ;  Surgeon: Mariner Gladis RAYMOND, MD;  Location: [facility] ENDOSCOPY;  Service: Endoscopy;  Laterality: N/A;   fundoflication  2015   at [facility] hospital   LAPAROSCOPIC HYSTERECTOMY Bilateral 02/28/2016   Procedure: HYSTERECTOMY TOTAL LAPAROSCOPIC / BSO;  Surgeon: Mitzie BROCKS Ward, MD;  Location: [facility] ORS;  Service: Gynecology;  Laterality: Bilateral;   NECK SURGERY     X 2   NISSEN FUNDOPLICATION     TOTAL HIP ARTHROPLASTY Right 01/02/2022   Procedure: TOTAL HIP ARTHROPLASTY ANTERIOR APPROACH;  Surgeon: Kathlynn Sharper, MD;  Location: [facility] ORS;  Service: Orthopedics;  Laterality: Right;   TUBAL LIGATION     URETEROSCOPY WITH HOLMIUM LASER LITHOTRIPSY Left 07/03/2016   Procedure: URETEROSCOPY WITH HOLMIUM LASER LITHOTRIPSY;  Surgeon: Sharper JONELLE Burkes, MD;  Location: [facility] ORS;  Service: Urology;  Laterality: Left;    Social History   Socioeconomic History   Marital status: Widowed    Spouse name: Not on file   Number of children: 2    Years of education: Not on file   Highest education level: High school graduate  Occupational History    Comment: fulltime  Tobacco Use   Smoking status: Former    Current packs/day: 0.00    Types: Cigarettes    Start date: 11/14/1974    Quit date: 05/14/2014    Years since quitting: 9.8   Smokeless tobacco: Never  Vaping Use   Vaping status: Every Day   Substances: Nicotine  Substance and Sexual Activity   Alcohol  use: Not Currently    Alcohol /week: 0.0 - 1.0 standard drinks of alcohol    Drug use: No   Sexual activity: Yes    Partners: Male    Birth control/protection: Condom  Other Topics Concern   Not on file  Social History Narrative   Not on file   Social Drivers of Health   Financial Resource Strain: Low Risk  (10/18/2023)   Overall Financial Resource Strain (CARDIA)    Difficulty of Paying Living Expenses: Not very hard  Food Insecurity: No Food Insecurity (10/18/2023)   Hunger Vital Sign    Worried About Running Out of Food in the Last Year: Never  true    Ran Out of Food in the Last Year: Never true  Transportation Needs: No Transportation Needs (10/18/2023)   PRAPARE - Administrator, Civil Service (Medical): No    Lack of Transportation (Non-Medical): No  Physical Activity: Inactive (10/18/2023)   Exercise Vital Sign    Days of Exercise per Week: 0 days    Minutes of Exercise per Session: 0 min  Stress: Stress Concern Present (10/18/2023)   [employer] of Occupational Health - Occupational Stress Questionnaire    Feeling of Stress : Very much  Social Connections: Socially Isolated (10/18/2023)   Social Connection and Isolation Panel [NHANES]    Frequency of Communication with Friends and Family: Never    Frequency of Social Gatherings with Friends and Family: More than three times a week    Attends Religious Services: Never    Database administrator or Organizations: No    Attends Banker Meetings: Never    Marital Status: Widowed   Intimate Partner Violence: Not At Risk (10/18/2023)   Humiliation, Afraid, Rape, and Kick questionnaire    Fear of Current or Ex-Partner: No    Emotionally Abused: No    Physically Abused: No    Sexually Abused: No    Family History  Problem Relation Age of Onset   Depression Mother    Anxiety disorder Mother    Breast cancer Neg Hx     Allergies  Allergen Reactions   Codeine Nausea Only    Review of Systems  Constitutional:  Positive for malaise/fatigue.  Musculoskeletal:  Positive for myalgias.  All other systems reviewed and are negative.      Objective:   BP 108/78   Pulse 66   Ht 5' (1.524 m)   Wt 162 lb 12.8 oz (73.8 kg)   SpO2 97%   BMI 31.79 kg/m   Vitals:   03/24/24 0953  BP: 108/78  Pulse: 66  Height: 5' (1.524 m)  Weight: 162 lb 12.8 oz (73.8 kg)  SpO2: 97%  BMI (Calculated): 31.79    Physical Exam Vitals and nursing note reviewed.  Constitutional:      Appearance: Normal appearance. She is well-developed. She is obese.  HENT:     Head: Normocephalic and atraumatic.  Eyes:     Extraocular Movements: Extraocular movements intact.     Conjunctiva/sclera: Conjunctivae normal.     Pupils: Pupils are equal, round, and reactive to light.  Cardiovascular:     Rate and Rhythm: Normal rate.  Pulmonary:     Effort: Pulmonary effort is normal.  Neurological:     General: No focal deficit present.     Mental Status: She is alert and oriented to person, place, and time. Mental status is at baseline.  Psychiatric:        Mood and Affect: Mood normal.        Behavior: Behavior normal. Behavior is cooperative.        Thought Content: Thought content normal.        Judgment: Judgment normal.      No results found for any visits on 03/24/24.  Recent Results (from the past 2160 hours)  TSH+T4F+T3Free     Status: Abnormal   Collection Time: 01/07/24 10:14 AM  Result Value Ref Range   TSH 0.025 (L) 0.450 - 4.500 uIU/mL   T3, Free 4.4 2.0 - 4.4  pg/mL   Free T4 2.39 (H) 0.82 - 1.77 ng/dL  POCT XPERT XPRESS SARS COVID-2/FLU/RSV  Status: None   Collection Time: 01/07/24 10:57 AM  Result Value Ref Range   SARS Coronavirus 2 Positive    FLU A Negative    FLU B Negative    RSV RNA, PCR Negative   POCT Urinalysis Dipstick (18997)     Status: Abnormal   Collection Time: 01/22/24  3:17 PM  Result Value Ref Range   Color, UA YELLOW    Clarity, UA DARK    Glucose, UA Negative Negative   Bilirubin, UA 1+    Ketones, UA POSITIVE    Spec Grav, UA >=1.030 (A) 1.010 - 1.025   Blood, UA 3+    pH, UA 5.5 5.0 - 8.0   Protein, UA Positive (A) Negative   Urobilinogen, UA 0.2 0.2 or 1.0 E.U./dL   Nitrite, UA NEGATIVE    Leukocytes, UA Small (1+) (A) Negative   Appearance DARK    Odor NO   Urinalysis, Routine w reflex microscopic     Status: Abnormal   Collection Time: 01/22/24  4:10 PM  Result Value Ref Range   Specific Gravity, UA      >=1.030 (A) 1.005 - 1.030   pH, UA 5.5 5.0 - 7.5   Color, UA Yellow Yellow   Appearance Ur Turbid (A) Clear   Leukocytes,UA 2+ (A) Negative   Protein,UA 2+ (A) Negative/Trace   Glucose, UA Negative Negative   Ketones, UA Trace (A) Negative   RBC, UA 3+ (A) Negative   Bilirubin, UA CANCELED     Comment: Test not performed. Unable to perform test due to current unavailability of reagents.  Result canceled by the ancillary.    Urobilinogen, Ur 1.0 0.2 - 1.0 mg/dL   Nitrite, UA Negative Negative   Microscopic Examination See below:     Comment: Microscopic was indicated and was performed.  Urine Culture     Status: Abnormal   Collection Time: 01/22/24  4:10 PM   Specimen: Urine, Clean Catch   UR  Result Value Ref Range   Urine Culture, Routine Final report (A)    Organism ID, Bacteria Escherichia coli (A)     Comment: Cefazolin  with an MIC <=16 predicts susceptibility to the oral agents cefaclor, cefdinir, cefpodoxime, cefprozil, cefuroxime, cephalexin, and loracarbef when used for therapy  of uncomplicated urinary tract infections due to E. coli, Klebsiella pneumoniae, and Proteus mirabilis. 50,000-100,000 colony forming units per mL    Antimicrobial Susceptibility Comment     Comment:       ** S = Susceptible; I = Intermediate; R = Resistant **                    P = Positive; N = Negative             MICS are expressed in micrograms per mL    Antibiotic                 RSLT#1    RSLT#2    RSLT#3    RSLT#4 Amoxicillin /Clavulanic Acid    S Ampicillin                     S Cefazolin                       S Cefepime                       S Cefoxitin  S Cefpodoxime                    S Ceftriaxone                     S Ciprofloxacin                   R Ertapenem                      S Gentamicin                      S Levofloxacin                   R Meropenem                      S Nitrofurantoin                  S Piperacillin/Tazobactam        S Tetracycline                   S Tobramycin                     S Trimethoprim /Sulfa              S   Microscopic Examination     Status: Abnormal   Collection Time: 01/22/24  4:10 PM  Result Value Ref Range   WBC, UA >30 (A) 0 - 5 /hpf   RBC, Urine >30 (A) 0 - 2 /hpf   Epithelial Cells (non renal) 0-10 0 - 10 /hpf   Casts None seen None seen /lpf   Bacteria, UA None seen None seen/Few  Lipid panel     Status: Abnormal   Collection Time: 02/21/24 11:16 AM  Result Value Ref Range   Cholesterol, Total 98 (L) 100 - 199 mg/dL   Triglycerides 90 0 - 149 mg/dL   HDL 36 (L) >60 mg/dL   VLDL Cholesterol Cal 18 5 - 40 mg/dL   LDL Chol Calc (NIH) 44 0 - 99 mg/dL   Chol/HDL Ratio 2.7 0.0 - 4.4 ratio    Comment:                                   T. Chol/HDL Ratio                                             Men  Women                               1/2 Avg.Risk  3.4    3.3                                   Avg.Risk  5.0    4.4                                2X Avg.Risk  9.6    7.1  3X Avg.Risk 23.4   11.0   VITAMIN D  25 Hydroxy (Vit-D Deficiency, Fractures)     Status: None   Collection Time: 02/21/24 11:16 AM  Result Value Ref Range   Vit D, 25-Hydroxy 37.4 30.0 - 100.0 ng/mL    Comment: Vitamin D  deficiency has been defined by the Institute of Medicine and an Endocrine Society practice guideline as a level of serum 25-OH vitamin D  less than 20 ng/mL (1,2). The Endocrine Society went on to further define vitamin D  insufficiency as a level between 21 and 29 ng/mL (2). 1. IOM (Institute of Medicine). 2010. Dietary reference    intakes for calcium  and D. [place]: The    [employer]. 2. Holick MF, Binkley Almedia, Bischoff-Ferrari HA, et al.    Evaluation, treatment, and prevention of vitamin D     deficiency: an Endocrine Society clinical practice    guideline. JCEM. 2011 Jul; 96(7):1911-30.   CMP14+EGFR     Status: Abnormal   Collection Time: 02/21/24 11:16 AM  Result Value Ref Range   Glucose 100 (H) 70 - 99 mg/dL   BUN 11 8 - 27 mg/dL   Creatinine, Ser 9.30 0.57 - 1.00 mg/dL   eGFR 96 >40 fO/fpw/8.26   BUN/Creatinine Ratio 16 12 - 28   Sodium 140 134 - 144 mmol/L   Potassium 4.8 3.5 - 5.2 mmol/L   Chloride 103 96 - 106 mmol/L   CO2 24 20 - 29 mmol/L   Calcium  9.6 8.7 - 10.3 mg/dL   Total Protein 6.3 6.0 - 8.5 g/dL   Albumin 4.1 3.9 - 4.9 g/dL   Globulin, Total 2.2 1.5 - 4.5 g/dL   Bilirubin Total 0.7 0.0 - 1.2 mg/dL   Alkaline Phosphatase 84 44 - 121 IU/L   AST 23 0 - 40 IU/L   ALT 12 0 - 32 IU/L  Hemoglobin A1c     Status: Abnormal   Collection Time: 02/21/24 11:16 AM  Result Value Ref Range   Hgb A1c MFr Bld 5.7 (H) 4.8 - 5.6 %    Comment:          Prediabetes: 5.7 - 6.4          Diabetes: >6.4          Glycemic control for adults with diabetes: <7.0    Est. average glucose Bld gHb Est-mCnc 117 mg/dL  Vitamin B12     Status: None   Collection Time: 02/21/24 11:16 AM  Result Value Ref Range   Vitamin B-12 430 232 - 1,245 pg/mL   TSH+T4F+T3Free     Status: Abnormal   Collection Time: 02/21/24 11:16 AM  Result Value Ref Range   TSH <0.005 (L) 0.450 - 4.500 uIU/mL   T3, Free 4.7 (H) 2.0 - 4.4 pg/mL   Free T4 2.55 (H) 0.82 - 1.77 ng/dL  CBC with Diff     Status: None   Collection Time: 02/21/24 11:16 AM  Result Value Ref Range   WBC 7.6 3.4 - 10.8 x10E3/uL   RBC 4.54 3.77 - 5.28 x10E6/uL   Hemoglobin 13.7 11.1 - 15.9 g/dL   Hematocrit 58.9 65.9 - 46.6 %   MCV 90 79 - 97 fL   MCH 30.2 26.6 - 33.0 pg   MCHC 33.4 31.5 - 35.7 g/dL   RDW 87.6 88.2 - 84.5 %   Platelets 271 150 - 450 x10E3/uL   Neutrophils 69 Not Estab. %   Lymphs 19 Not Estab. %   Monocytes 9 Not  Estab. %   Eos 2 Not Estab. %   Basos 1 Not Estab. %   Neutrophils Absolute 5.2 1.4 - 7.0 x10E3/uL   Lymphocytes Absolute 1.5 0.7 - 3.1 x10E3/uL   Monocytes Absolute 0.7 0.1 - 0.9 x10E3/uL   EOS (ABSOLUTE) 0.2 0.0 - 0.4 x10E3/uL   Basophils Absolute 0.1 0.0 - 0.2 x10E3/uL   Immature Granulocytes 0 Not Estab. %   Immature Grans (Abs) 0.0 0.0 - 0.1 x10E3/uL       Assessment & Plan Acquired hypothyroidism Encounter for medication management Rechecking TSH, T3 and T4 levels today.  Will call with results when available.     Return in about 1 month (around 04/24/2024) for F/U.   Total time spent: 20 minutes  ALAN CHRISTELLA ARRANT, FNP  03/24/2024   This document may have been prepared by [facility] Voice Recognition software and as such may include unintentional dictation errors.

## 2024-03-25 LAB — TSH+T4F+T3FREE
Free T4: 1.66 ng/dL (ref 0.82–1.77)
T3, Free: 3.5 pg/mL (ref 2.0–4.4)
TSH: 0.014 u[IU]/mL — ABNORMAL LOW (ref 0.450–4.500)

## 2024-03-26 ENCOUNTER — Ambulatory Visit

## 2024-03-26 ENCOUNTER — Ambulatory Visit: Payer: Self-pay

## 2024-03-26 DIAGNOSIS — I5032 Chronic diastolic (congestive) heart failure: Secondary | ICD-10-CM | POA: Diagnosis not present

## 2024-03-26 DIAGNOSIS — Z8679 Personal history of other diseases of the circulatory system: Secondary | ICD-10-CM | POA: Diagnosis not present

## 2024-03-26 DIAGNOSIS — R42 Dizziness and giddiness: Secondary | ICD-10-CM

## 2024-03-26 DIAGNOSIS — R0602 Shortness of breath: Secondary | ICD-10-CM | POA: Diagnosis not present

## 2024-03-26 DIAGNOSIS — I10 Essential (primary) hypertension: Secondary | ICD-10-CM

## 2024-03-26 DIAGNOSIS — R0789 Other chest pain: Secondary | ICD-10-CM

## 2024-03-26 DIAGNOSIS — R002 Palpitations: Secondary | ICD-10-CM

## 2024-03-30 ENCOUNTER — Encounter: Payer: Self-pay | Admitting: Cardiovascular Disease

## 2024-03-30 ENCOUNTER — Ambulatory Visit: Admitting: Cardiovascular Disease

## 2024-03-30 VITALS — BP 110/76 | HR 74 | Ht 60.0 in | Wt 162.0 lb

## 2024-03-30 DIAGNOSIS — I5032 Chronic diastolic (congestive) heart failure: Secondary | ICD-10-CM | POA: Diagnosis not present

## 2024-03-30 DIAGNOSIS — R002 Palpitations: Secondary | ICD-10-CM | POA: Diagnosis not present

## 2024-03-30 DIAGNOSIS — Z8679 Personal history of other diseases of the circulatory system: Secondary | ICD-10-CM | POA: Diagnosis not present

## 2024-03-30 DIAGNOSIS — E1142 Type 2 diabetes mellitus with diabetic polyneuropathy: Secondary | ICD-10-CM

## 2024-03-30 DIAGNOSIS — I340 Nonrheumatic mitral (valve) insufficiency: Secondary | ICD-10-CM | POA: Diagnosis not present

## 2024-03-30 DIAGNOSIS — R0602 Shortness of breath: Secondary | ICD-10-CM

## 2024-03-30 DIAGNOSIS — G9589 Other specified diseases of spinal cord: Secondary | ICD-10-CM

## 2024-03-30 DIAGNOSIS — R0789 Other chest pain: Secondary | ICD-10-CM | POA: Diagnosis not present

## 2024-03-30 DIAGNOSIS — F331 Major depressive disorder, recurrent, moderate: Secondary | ICD-10-CM

## 2024-03-30 DIAGNOSIS — Z0130 Encounter for examination of blood pressure without abnormal findings: Secondary | ICD-10-CM

## 2024-03-30 MED ORDER — EMPAGLIFLOZIN 10 MG PO TABS: 10.0000 mg | ORAL_TABLET | Freq: Every day | ORAL | 2 refills | Status: DC

## 2024-03-30 NOTE — Progress Notes (Signed)
 Cardiology Office Note   Date:  03/30/2024   ID:  Evelyn Bentley, DOB 11/20/1955, MRN [id number]  PCP:  Trenda Frisk, FNP  Cardiologist:  Debborah Fairly, MD      History of Present Illness: Evelyn Bentley is a 68 y.o. female who presents for  Chief Complaint  Patient presents with   Follow-up    ECHO/ NST Results    Has palpitation and chest pain like pressure.      Past Medical History:  Diagnosis Date   Abnormal drug screen (03/04/2023) 03/25/2023   (03/04/2023) UDS (+) for carboxy-THC (marijuana)      Abnormal MRI, cervical spine (05/10/2021) 03/25/2023   (05/10/2021) CERVICAL MRI FINDINGS:  Cord: Mild cord hyperintensity at C5, unchanged.     DISC LEVELS:  C2-3: Mild left foraminal narrowing due to facet hypertrophy  C3-4: Small central disc protrusion unchanged. Mild spinal stenosis. Bilateral facet degeneration.  C4-5: ACDF with anterior plate. Mild left foraminal narrowing.  C5-6: Solid interbody fusion without plate.  C6-7: Solid interbody fusi   Abnormal MRI, lumbar spine (12/11/2022) 03/04/2023   (12/11/2022) LUMBAR MRI FINDINGS:  Alignment:  Mild degenerative anterolisthesis at L3-4 and L4-5     DISC LEVELS:  T12- L1: Mild progression of chronic central protrusion.  L1-2: Small left paracentral protrusion with mild interval regression.  L2-3: Disc bulging eccentric to the left with inferior foraminal protrusion. Mild facet spurring. Mild left foraminal narrowing.  L3-4: Degenerative facet    Abnormal NCS (nerve conduction studies) 03/04/2023   Upper extremity EMG IMPRESSION: Abnormal study. 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      There is an electrodiagnostic and sonographic evidence of bilateral median neuropathies at the wrists (i.e carpal tunnel syndrome) and electrodiagnostic and s   Anxiety    Arthritis     Bursitis    Rt hip   Cervical facet joint pain 03/04/2023   Closed fracture of patella 05/06/2017   Complication of anesthesia    a.) PONV. 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   Family history of adverse reaction to anesthesia    a.) mother - severe PONV   Fibromyalgia    GERD (gastroesophageal reflux disease)    H/O arthritis 03/06/2015   H/O: hypothyroidism 03/06/2015   History of 2019 novel coronavirus disease (COVID-19) 12/20/2020   a.) preoperative PCR (+) on 12/20/2020; retested following day per pt/surgeon request; PCR once again (+) on 12/21/2020   History of carpal tunnel release of wrists (Bilateral) 03/04/2023   History of fusion of cervical spine (C4-C7) 03/04/2023   History of kidney stones    History of marijuana use 03/25/2023   (03/04/2023) UDS (+) for carboxy-THC (marijuana)      History of migraine headaches 05/02/2015   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   Hypercholesteremia    Hypertension    Hypothyroidism    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   Knee pain 05/06/2017   PONV (postoperative nausea and vomiting)    Pre-diabetes    Sepsis due to urinary tract infection (HCC) 07/02/2016   Sleep apnea    MILD- DOESN'T NEED cpap   Status post total hip replacement, 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   Thyroid  disease    Weakness of limb 05/06/2017     Past Surgical History:  Procedure Laterality Date   ABDOMINAL  HYSTERECTOMY     ANTERIOR INTEROSSEOUS NERVE DECOMPRESSION Left 01/05/2021   Procedure: Cubital tunnel release, left;  Surgeon: Molli Angelucci, MD;  Location: [facility];  Service: Orthopedics;  Laterality: Left;   CARPAL TUNNEL RELEASE Right 09/01/2020   Procedure: Right carpal tunnel release;  Surgeon: Molli Angelucci, MD;  Location: [facility];  Service: Orthopedics;  Laterality: Right;   CARPAL TUNNEL RELEASE Left 01/05/2021   Procedure: Carpal tunnel release, left;  Surgeon: Molli Angelucci, MD;  Location: [facility];  Service: Orthopedics;  Laterality: Left;   CATARACT EXTRACTION, BILATERAL     CHOLECYSTECTOMY     COLONOSCOPY WITH PROPOFOL  N/A 11/13/2016   Procedure: COLONOSCOPY WITH PROPOFOL ;  Surgeon: Deveron Fly, MD;  Location: [facility]COPY;  Service: Endoscopy;  Laterality: N/A;   ESOPHAGOGASTRODUODENOSCOPY (EGD) WITH PROPOFOL  N/A 11/13/2016   Procedure: ESOPHAGOGASTRODUODENOSCOPY (EGD) WITH PROPOFOL ;  Surgeon: Deveron Fly, MD;  Location: [facility] ENDOSCOPY;  Service: Endoscopy;  Laterality: N/A;   ESOPHAGOGASTRODUODENOSCOPY (EGD) WITH PROPOFOL  N/A 12/16/2018   Procedure: ESOPHAGOGASTRODUODENOSCOPY (EGD) WITH PROPOFOL ;  Surgeon: Deveron Fly, MD;  Location[facility];  Service: Endoscopy;  Laterality: N/A;   fundoflication  2015   at [facility] hospital   LAPAROSCOPIC HYSTERECTOMY Bilateral 02/28/2016   Procedure: HYSTERECTOMY TOTAL LAPAROSCOPIC / BSO;  Surgeon: Margarie Shay Ward, MD;  Location: [facility];  Service: Gynecology;  Laterality: Bilateral;   NECK SURGERY     X 2   NISSEN FUNDOPLICATION     TOTAL HIP ARTHROPLASTY Right 01/02/2022   Procedure: TOTAL HIP ARTHROPLASTY ANTERIOR APPROACH;  Surgeon: Molli Angelucci, MD;  Location: [facility];  Service: Orthopedics;  Laterality: Right;   TUBAL LIGATION     URETEROSCOPY WITH HOLMIUM LASER LITHOTRIPSY Left 07/03/2016   Procedure: URETEROSCOPY WITH HOLMIUM LASER LITHOTRIPSY;  Surgeon: Rea Cambridge, MD;  Location: [facility];  Service: Urology;  Laterality: Left;     Current Outpatient Medications  Medication Sig Dispense Refill   acetaminophen  (TYLENOL ) 650 MG CR tablet Take 1,950 mg by mouth every 8 (eight) hours as needed for pain.     apixaban  (ELIQUIS ) 5 MG TABS tablet Take 1 tablet (5 mg total) by mouth 2 (two) times daily. 60 tablet 2   chlorpheniramine (CHLOR-TRIMETON) 4 MG tablet Take 4 mg by mouth 2 (two) times daily as needed for allergies.     Dexlansoprazole  (DEXILANT ) 30 MG capsule DR Take 1  capsule (30 mg total) by mouth daily. 30 capsule 5   DULoxetine  (CYMBALTA ) 30 MG capsule Take 1 capsule (30 mg total) by mouth daily. 90 capsule 3   empagliflozin  (JARDIANCE ) 10 MG TABS tablet Take 1 tablet (10 mg total) by mouth daily. 30 tablet 2   gabapentin  (NEURONTIN ) 300 MG capsule Take 300 mg by mouth 3 (three) times daily.     levothyroxine  (SYNTHROID ) 100 MCG tablet Take 1 tablet (100 mcg total) by mouth daily. 30 tablet 11   lipase/protease/amylase (CREON ) 36000 UNITS CPEP capsule Take 1 capsule with any snacks, and take 2 capsules with meals. 360 capsule 1   lisinopril -hydrochlorothiazide  (PRINZIDE ,ZESTORETIC ) 10-12.5 MG tablet Take 1 tablet by mouth daily.   2   naloxone (NARCAN) nasal spray 4 mg/0.1 mL Place 1 spray into the nose once.     ondansetron  (ZOFRAN ) 8 MG tablet Take 8 mg by mouth as needed.     oxyCODONE -acetaminophen  (PERCOCET/ROXICET) 5-325 MG tablet Take 1 tablet by mouth 3 (three) times daily as needed.     polyethylene glycol (MIRALAX  / GLYCOLAX ) 17 g packet  Take 17 g by mouth daily as needed for mild constipation. 14 each 0   propranolol  (INDERAL ) 20 MG tablet Take 1 tablet (20 mg total) by mouth 2 (two) times daily. 60 tablet 11   rosuvastatin  (CRESTOR ) 20 MG tablet Take 1 tablet (20 mg total) by mouth daily. 90 tablet 1   No current facility-administered medications for this visit.    Allergies:   Codeine    Social History:   reports that she quit smoking about 9 years ago. Her smoking use included cigarettes. She started smoking about 49 years ago. She has never used smokeless tobacco. She reports that she does not currently use alcohol . She reports that she does not use drugs.   Family History:  family history includes Anxiety disorder in her mother; Depression in her mother.    ROS:     Review of Systems  Constitutional: Negative.   HENT: Negative.    Eyes: Negative.   Respiratory: Negative.    Gastrointestinal: Negative.   Genitourinary: Negative.    Musculoskeletal: Negative.   Skin: Negative.   Neurological: Negative.   Endo/Heme/Allergies: Negative.   Psychiatric/Behavioral: Negative.    All other systems reviewed and are negative.     All other systems are reviewed and negative.    PHYSICAL EXAM: VS:  BP 110/76   Pulse 74   Ht 5' (1.524 m)   Wt 162 lb (73.5 kg)   SpO2 95%   BMI 31.64 kg/m  , BMI Body mass index is 31.64 kg/m. Last weight:  Wt Readings from Last 3 Encounters:  03/30/24 162 lb (73.5 kg)  03/24/24 162 lb 12.8 oz (73.8 kg)  03/06/24 164 lb 12.8 oz (74.8 kg)     Physical Exam Constitutional:      Appearance: Normal appearance.  Cardiovascular:     Rate and Rhythm: Normal rate and regular rhythm.     Heart sounds: Normal heart sounds.  Pulmonary:     Effort: Pulmonary effort is normal.     Breath sounds: Normal breath sounds.  Musculoskeletal:     Right lower leg: No edema.     Left lower leg: No edema.  Neurological:     Mental Status: She is alert.       EKG:   Recent Labs: 02/21/2024: ALT 12; BUN 11; Creatinine, Ser 0.69; Hemoglobin 13.7; Platelets 271; Potassium 4.8; Sodium 140 03/24/2024: TSH 0.014    Lipid Panel    Component Value Date/Time   CHOL 98 (L) 02/21/2024 1116   TRIG 90 02/21/2024 1116   HDL 36 (L) 02/21/2024 1116   CHOLHDL 2.7 02/21/2024 1116   LDLCALC 44 02/21/2024 1116      Other studies Reviewed: Additional studies/ records that were reviewed today include:  Review of the above records demonstrates:       No data to display            ASSESSMENT AND PLAN:    ICD-10-CM   1. Palpitations  R00.2 empagliflozin  (JARDIANCE ) 10 MG TABS tablet    DISCONTINUED: empagliflozin  (JARDIANCE ) 10 MG TABS tablet    2. Other chest pain  R07.89 empagliflozin  (JARDIANCE ) 10 MG TABS tablet    DISCONTINUED: empagliflozin  (JARDIANCE ) 10 MG TABS tablet   stress test was normal no ischaemia.    3. Atrial fibrillation, currently in sinus rhythm  Z86.79 empagliflozin   (JARDIANCE ) 10 MG TABS tablet    DISCONTINUED: empagliflozin  (JARDIANCE ) 10 MG TABS tablet    4. SOB (shortness of breath)  R06.02 empagliflozin  (  JARDIANCE ) 10 MG TABS tablet    DISCONTINUED: empagliflozin  (JARDIANCE ) 10 MG TABS tablet   has SOB, and grade 1 diastolic dysfunction, may have HEpEF, add jaurdiance    5. Chronic diastolic congestive heart failure (HCC)  I50.32 empagliflozin  (JARDIANCE ) 10 MG TABS tablet    DISCONTINUED: empagliflozin  (JARDIANCE ) 10 MG TABS tablet   Normal systolic function but has diastolic dysfunction.    6. Nonrheumatic mitral valve regurgitation  I34.0     7. 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 MDD (major depressive disorder), recurrent episode, moderate (HCC) Chronic F33.1     9. Diabetic polyneuropathy associated with type 2 diabetes mellitus (HCC) Chronic E11.42        Problem List Items Addressed This Visit       Cardiovascular and Mediastinum   Chronic diastolic congestive heart failure (HCC)   Relevant Medications   empagliflozin  (JARDIANCE ) 10 MG TABS tablet     Endocrine   Diabetic polyneuropathy associated with type 2 diabetes mellitus (HCC)   Relevant Medications   empagliflozin  (JARDIANCE ) 10 MG TABS tablet     Nervous and Auditory   Cervical cord myelomalacia of (C5) (Chronic)     Other   MDD (major depressive disorder), recurrent episode, moderate (HCC)   Other Visit Diagnoses       Palpitations    -  Primary   Relevant Medications   empagliflozin  (JARDIANCE ) 10 MG TABS tablet     Other chest pain       stress test was normal no ischaemia.   Relevant Medications   empagliflozin  (JARDIANCE ) 10 MG TABS tablet     Atrial fibrillation, currently in sinus rhythm       Relevant Medications   empagliflozin  (JARDIANCE ) 10 MG TABS tablet     SOB (shortness of breath)       has SOB, and grade 1 diastolic dysfunction, may have HEpEF, add jaurdiance   Relevant Medications   empagliflozin  (JARDIANCE ) 10 MG TABS  tablet     Nonrheumatic mitral valve regurgitation              Disposition:   No follow-ups on file.    Total time spent: 30 minutes  Signed,  Debborah Fairly, MD  03/30/2024 10:19 AM    [facility]

## 2024-03-31 ENCOUNTER — Ambulatory Visit

## 2024-03-31 ENCOUNTER — Ambulatory Visit: Payer: Self-pay

## 2024-04-02 DIAGNOSIS — E6609 Other obesity due to excess calories: Secondary | ICD-10-CM | POA: Diagnosis not present

## 2024-04-02 DIAGNOSIS — Z9181 History of falling: Secondary | ICD-10-CM | POA: Diagnosis not present

## 2024-04-02 DIAGNOSIS — Z79899 Other long term (current) drug therapy: Secondary | ICD-10-CM | POA: Diagnosis not present

## 2024-04-02 DIAGNOSIS — M5416 Radiculopathy, lumbar region: Secondary | ICD-10-CM | POA: Diagnosis not present

## 2024-04-02 DIAGNOSIS — R7303 Prediabetes: Secondary | ICD-10-CM | POA: Diagnosis not present

## 2024-04-02 DIAGNOSIS — K5900 Constipation, unspecified: Secondary | ICD-10-CM | POA: Diagnosis not present

## 2024-04-06 DIAGNOSIS — Z79899 Other long term (current) drug therapy: Secondary | ICD-10-CM | POA: Diagnosis not present

## 2024-04-09 ENCOUNTER — Encounter: Payer: Self-pay | Admitting: Family

## 2024-04-13 ENCOUNTER — Other Ambulatory Visit

## 2024-04-13 DIAGNOSIS — E039 Hypothyroidism, unspecified: Secondary | ICD-10-CM | POA: Diagnosis not present

## 2024-04-13 DIAGNOSIS — R0602 Shortness of breath: Secondary | ICD-10-CM | POA: Diagnosis not present

## 2024-04-13 DIAGNOSIS — E611 Iron deficiency: Secondary | ICD-10-CM

## 2024-04-14 ENCOUNTER — Ambulatory Visit: Payer: Self-pay

## 2024-04-14 LAB — TSH+T4F+T3FREE
Free T4: 1.38 ng/dL (ref 0.82–1.77)
T3, Free: 3 pg/mL (ref 2.0–4.4)
TSH: 0.03 u[IU]/mL — ABNORMAL LOW (ref 0.450–4.500)

## 2024-04-14 LAB — IRON,TIBC AND FERRITIN PANEL
Ferritin: 27 ng/mL (ref 15–150)
Iron Saturation: 20 % (ref 15–55)
Iron: 73 ug/dL (ref 27–139)
Total Iron Binding Capacity: 368 ug/dL (ref 250–450)
UIBC: 295 ug/dL (ref 118–369)

## 2024-04-16 ENCOUNTER — Ambulatory Visit: Payer: Self-pay | Admitting: Cardiology

## 2024-04-24 ENCOUNTER — Ambulatory Visit: Admitting: Family

## 2024-04-24 ENCOUNTER — Encounter: Payer: Self-pay | Admitting: Family

## 2024-04-24 VITALS — BP 120/66 | HR 57 | Ht 60.0 in | Wt 161.2 lb

## 2024-04-24 DIAGNOSIS — E039 Hypothyroidism, unspecified: Secondary | ICD-10-CM | POA: Diagnosis not present

## 2024-04-24 DIAGNOSIS — Z0130 Encounter for examination of blood pressure without abnormal findings: Secondary | ICD-10-CM

## 2024-04-24 DIAGNOSIS — I4891 Unspecified atrial fibrillation: Secondary | ICD-10-CM | POA: Diagnosis not present

## 2024-04-24 DIAGNOSIS — G118 Other hereditary ataxias: Secondary | ICD-10-CM

## 2024-04-24 DIAGNOSIS — E890 Postprocedural hypothyroidism: Secondary | ICD-10-CM | POA: Diagnosis not present

## 2024-05-04 DIAGNOSIS — M546 Pain in thoracic spine: Secondary | ICD-10-CM | POA: Diagnosis not present

## 2024-05-04 DIAGNOSIS — G8929 Other chronic pain: Secondary | ICD-10-CM | POA: Diagnosis not present

## 2024-05-04 DIAGNOSIS — M25552 Pain in left hip: Secondary | ICD-10-CM | POA: Diagnosis not present

## 2024-05-04 DIAGNOSIS — K5900 Constipation, unspecified: Secondary | ICD-10-CM | POA: Diagnosis not present

## 2024-05-04 DIAGNOSIS — R7303 Prediabetes: Secondary | ICD-10-CM | POA: Diagnosis not present

## 2024-05-04 DIAGNOSIS — M5416 Radiculopathy, lumbar region: Secondary | ICD-10-CM | POA: Diagnosis not present

## 2024-05-04 DIAGNOSIS — Z9181 History of falling: Secondary | ICD-10-CM | POA: Diagnosis not present

## 2024-05-04 DIAGNOSIS — Z79899 Other long term (current) drug therapy: Secondary | ICD-10-CM | POA: Diagnosis not present

## 2024-05-04 DIAGNOSIS — E6609 Other obesity due to excess calories: Secondary | ICD-10-CM | POA: Diagnosis not present

## 2024-05-06 DIAGNOSIS — Z79899 Other long term (current) drug therapy: Secondary | ICD-10-CM | POA: Diagnosis not present

## 2024-05-14 ENCOUNTER — Other Ambulatory Visit: Payer: Self-pay | Admitting: Family

## 2024-05-29 ENCOUNTER — Encounter: Payer: Self-pay | Admitting: Family

## 2024-05-29 ENCOUNTER — Ambulatory Visit: Admitting: Family

## 2024-05-29 ENCOUNTER — Other Ambulatory Visit: Payer: Self-pay | Admitting: Cardiovascular Disease

## 2024-05-29 ENCOUNTER — Ambulatory Visit
Admission: RE | Admit: 2024-05-29 | Discharge: 2024-05-29 | Disposition: A | Discharge status: Home / Self Care | Attending: Family | Admitting: Family

## 2024-05-29 ENCOUNTER — Ambulatory Visit
Admission: RE | Admit: 2024-05-29 | Discharge: 2024-05-29 | Disposition: A | Discharge status: Home / Self Care | Source: Ambulatory Visit | Attending: Family | Admitting: Family

## 2024-05-29 VITALS — BP 130/84 | HR 65 | Ht 60.0 in | Wt 162.4 lb

## 2024-05-29 DIAGNOSIS — I10 Essential (primary) hypertension: Secondary | ICD-10-CM

## 2024-05-29 DIAGNOSIS — M1812 Unilateral primary osteoarthritis of first carpometacarpal joint, left hand: Secondary | ICD-10-CM | POA: Diagnosis not present

## 2024-05-29 DIAGNOSIS — R42 Dizziness and giddiness: Secondary | ICD-10-CM

## 2024-05-29 DIAGNOSIS — R002 Palpitations: Secondary | ICD-10-CM

## 2024-05-29 DIAGNOSIS — R0789 Other chest pain: Secondary | ICD-10-CM

## 2024-05-29 DIAGNOSIS — M79642 Pain in left hand: Secondary | ICD-10-CM | POA: Insufficient documentation

## 2024-05-29 DIAGNOSIS — Z8679 Personal history of other diseases of the circulatory system: Secondary | ICD-10-CM

## 2024-05-29 DIAGNOSIS — R0602 Shortness of breath: Secondary | ICD-10-CM

## 2024-05-29 DIAGNOSIS — I5032 Chronic diastolic (congestive) heart failure: Secondary | ICD-10-CM

## 2024-05-29 NOTE — Progress Notes (Signed)
   05/29/2024  Patient ID: Evelyn Bentley, female   DOB: 01-15-1956, 68 y.o.   MRN: [id number]  Pharmacy Quality Measure Review  This patient is appearing on a report for being at risk of failing the adherence measure for diabetes medications this calendar year.   Medication: Jardiance  Last fill date: 04/29/24 for 30 day supply  Insurance report was not up to date. No action needed at this time.   Jon VEAR Lindau, PharmD Clinical Pharmacist [phone]

## 2024-06-01 ENCOUNTER — Encounter: Payer: Self-pay | Admitting: Cardiovascular Disease

## 2024-06-01 ENCOUNTER — Ambulatory Visit: Admitting: Cardiovascular Disease

## 2024-06-01 ENCOUNTER — Ambulatory Visit: Payer: Self-pay

## 2024-06-01 VITALS — BP 126/78 | HR 81 | Ht 60.0 in | Wt 162.2 lb

## 2024-06-01 DIAGNOSIS — I5032 Chronic diastolic (congestive) heart failure: Secondary | ICD-10-CM | POA: Diagnosis not present

## 2024-06-01 DIAGNOSIS — R0602 Shortness of breath: Secondary | ICD-10-CM

## 2024-06-01 DIAGNOSIS — Z8679 Personal history of other diseases of the circulatory system: Secondary | ICD-10-CM | POA: Diagnosis not present

## 2024-06-01 DIAGNOSIS — I10 Essential (primary) hypertension: Secondary | ICD-10-CM

## 2024-06-01 DIAGNOSIS — I340 Nonrheumatic mitral (valve) insufficiency: Secondary | ICD-10-CM

## 2024-06-01 NOTE — Progress Notes (Signed)
 Cardiology Office Note   Date:  06/01/2024   ID:  [name], DOB November 07, 1956, MRN [id number]  PCP:  Orlean Alan HERO, FNP  Cardiologist:  Denyse Bathe, MD      History of Present Illness: Evelyn Bentley is a 68 y.o. female who presents for  Chief Complaint  Patient presents with   Follow-up    2 month follow up    Sweats a lot, has DOE.      Past Medical History:  Diagnosis Date   Abnormal drug screen (03/04/2023) 03/25/2023   (03/04/2023) UDS (+) for carboxy-THC (marijuana)      Abnormal MRI, cervical spine (05/10/2021) 03/25/2023   (05/10/2021) CERVICAL MRI FINDINGS:  Cord: Mild cord hyperintensity at C5, unchanged.     DISC LEVELS:  C2-3: Mild left foraminal narrowing due to facet hypertrophy  C3-4: Small central disc protrusion unchanged. Mild spinal stenosis. Bilateral facet degeneration.  C4-5: ACDF with anterior plate. Mild left foraminal narrowing.  C5-6: Solid interbody fusion without plate.  C6-7: Solid interbody fusi   Abnormal MRI, lumbar spine (12/11/2022) 03/04/2023   (12/11/2022) LUMBAR MRI FINDINGS:  Alignment:  Mild degenerative anterolisthesis at L3-4 and L4-5     DISC LEVELS:  T12- L1: Mild progression of chronic central protrusion.  L1-2: Small left paracentral protrusion with mild interval regression.  L2-3: Disc bulging eccentric to the left with inferior foraminal protrusion. Mild facet spurring. Mild left foraminal narrowing.  L3-4: Degenerative facet    Abnormal NCS (nerve conduction studies) 03/04/2023   Upper extremity EMG IMPRESSION: Abnormal study. 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      There is an electrodiagnostic and sonographic evidence of bilateral median neuropathies at the wrists (i.e carpal tunnel syndrome) and electrodiagnostic and s   Anxiety    Arthritis    Bursitis    Rt hip    Cervical facet joint pain 03/04/2023   Closed fracture of patella 05/06/2017   Complication of anesthesia    a.) PONV. 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   Family history of adverse reaction to anesthesia    a.) mother - severe PONV   Fibromyalgia    GERD (gastroesophageal reflux disease)    H/O arthritis 03/06/2015   H/O: hypothyroidism 03/06/2015   History of 2019 novel coronavirus disease (COVID-19) 12/20/2020   a.) preoperative PCR (+) on 12/20/2020; retested following day per pt/surgeon request; PCR once again (+) on 12/21/2020   History of carpal tunnel release of wrists (Bilateral) 03/04/2023   History of fusion of cervical spine (C4-C7) 03/04/2023   History of kidney stones    History of marijuana use 03/25/2023   (03/04/2023) UDS (+) for carboxy-THC (marijuana)      History of migraine headaches 05/02/2015   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   Hypercholesteremia    Hypertension    Hypothyroidism    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   Knee pain 05/06/2017   PONV (postoperative nausea and vomiting)    Pre-diabetes    Sepsis due to urinary tract infection (HCC) 07/02/2016   Sleep apnea    MILD- DOESN'T NEED cpap   Status post total hip replacement, 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   Thyroid  disease    Weakness of limb 05/06/2017     Past Surgical History:  Procedure Laterality Date   ABDOMINAL HYSTERECTOMY  ANTERIOR INTEROSSEOUS NERVE DECOMPRESSION Left 01/05/2021   Procedure: Cubital tunnel release, left;  Surgeon: Kathlynn Sharper, MD;  Location: [facility];  Service: Orthopedics;  Laterality: Left;   CARPAL TUNNEL RELEASE Right 09/01/2020   Procedure: Right carpal tunnel release;  Surgeon: Kathlynn Sharper, MD;  Location: [facility];  Service: Orthopedics;  Laterality: Right;   CARPAL TUNNEL RELEASE Left 01/05/2021   Procedure: Carpal tunnel release, left;  Surgeon: Kathlynn Sharper, MD;  Location:  [facility];  Service: Orthopedics;  Laterality: Left;   CATARACT EXTRACTION, BILATERAL     CHOLECYSTECTOMY     COLONOSCOPY WITH PROPOFOL  N/A 11/13/2016   Procedure: COLONOSCOPY WITH PROPOFOL ;  Surgeon: Gladis RAYMOND Mariner, MD;  Location: [facility] ENDOSCOPY;  Service: Endoscopy;  Laterality: N/A;   ESOPHAGOGASTRODUODENOSCOPY (EGD) WITH PROPOFOL  N/A 11/13/2016   Procedure: ESOPHAGOGASTRODUODENOSCOPY (EGD) WITH PROPOFOL ;  Surgeon: Gladis RAYMOND Mariner, MD;  Location: [facility];  Service: Endoscopy;  Laterality: N/A;   ESOPHAGOGASTRODUODENOSCOPY (EGD) WITH PROPOFOL  N/A 12/16/2018   Procedure: ESOPHAGOGASTRODUODENOSCOPY (EGD) WITH PROPOFOL ;  Surgeon: Mariner Gladis RAYMOND, MD;  Location: [facility] ENDOSCOPY;  Service: Endoscopy;  Laterality: N/A;   fundoflication  2015   at [facility] hospital   LAPAROSCOPIC HYSTERECTOMY Bilateral 02/28/2016   Procedure: HYSTERECTOMY TOTAL LAPAROSCOPIC / BSO;  Surgeon: Mitzie BROCKS Ward, MD;  Location: [facility];  Service: Gynecology;  Laterality: Bilateral;   NECK SURGERY     X 2   NISSEN FUNDOPLICATION     TOTAL HIP ARTHROPLASTY Right 01/02/2022   Procedure: TOTAL HIP ARTHROPLASTY ANTERIOR APPROACH;  Surgeon: Kathlynn Sharper, MD;  Location: [facility];  Service: Orthopedics;  Laterality: Right;   TUBAL LIGATION     URETEROSCOPY WITH HOLMIUM LASER LITHOTRIPSY Left 07/03/2016   Procedure: URETEROSCOPY WITH HOLMIUM LASER LITHOTRIPSY;  Surgeon: Sharper JONELLE Burkes, MD;  Location: [facility];  Service: Urology;  Laterality: Left;     Current Outpatient Medications  Medication Sig Dispense Refill   acetaminophen  (TYLENOL ) 650 MG CR tablet Take 1,950 mg by mouth every 8 (eight) hours as needed for pain.     chlorpheniramine (CHLOR-TRIMETON) 4 MG tablet Take 4 mg by mouth 2 (two) times daily as needed for allergies.     ELIQUIS  5 MG TABS tablet TAKE 1 TABLET BY MOUTH TWICE A DAY 60 tablet 2   empagliflozin  (JARDIANCE ) 10 MG TABS tablet Take 1 tablet (10 mg total) by mouth daily. 30 tablet 2    gabapentin  (NEURONTIN ) 300 MG capsule Take 300 mg by mouth 3 (three) times daily.     levothyroxine  (SYNTHROID ) 100 MCG tablet Take 1 tablet (100 mcg total) by mouth daily. 30 tablet 11   lipase/protease/amylase (CREON ) 36000 UNITS CPEP capsule Take 1 capsule with any snacks, and take 2 capsules with meals. 360 capsule 1   lisinopril -hydrochlorothiazide  (PRINZIDE ,ZESTORETIC ) 10-12.5 MG tablet Take 1 tablet by mouth daily.   2   ondansetron  (ZOFRAN ) 8 MG tablet Take 8 mg by mouth as needed.     oxyCODONE -acetaminophen  (PERCOCET/ROXICET) 5-325 MG tablet Take 1 tablet by mouth 3 (three) times daily as needed.     polyethylene glycol (MIRALAX  / GLYCOLAX ) 17 g packet Take 17 g by mouth daily as needed for mild constipation. 14 each 0   propranolol  (INDERAL ) 20 MG tablet Take 1 tablet (20 mg total) by mouth 2 (two) times daily. 60 tablet 11   rosuvastatin  (CRESTOR ) 20 MG tablet TAKE 1 TABLET BY MOUTH EVERY DAY 90 tablet 1   DULoxetine  (CYMBALTA ) 30 MG capsule Take 1 capsule (30 mg total) by  mouth daily. 90 capsule 3   No current facility-administered medications for this visit.    Allergies:   Codeine    Social History:   reports that she quit smoking about 10 years ago. Her smoking use included cigarettes. She started smoking about 49 years ago. She has never used smokeless tobacco. She reports that she does not currently use alcohol . She reports that she does not use drugs.   Family History:  family history includes Anxiety disorder in her mother; Depression in her mother.    ROS:     Review of Systems  Constitutional: Negative.   HENT: Negative.    Eyes: Negative.   Respiratory: Negative.    Gastrointestinal: Negative.   Genitourinary: Negative.   Musculoskeletal: Negative.   Skin: Negative.   Neurological: Negative.   Endo/Heme/Allergies: Negative.   Psychiatric/Behavioral: Negative.    All other systems reviewed and are negative.     All other systems are reviewed and negative.     PHYSICAL EXAM: VS:  BP 126/78   Pulse 81   Ht 5' (1.524 m)   Wt 162 lb 3.2 oz (73.6 kg)   SpO2 98%   BMI 31.68 kg/m  , BMI Body mass index is 31.68 kg/m. Last weight:  Wt Readings from Last 3 Encounters:  06/01/24 162 lb 3.2 oz (73.6 kg)  05/29/24 162 lb 6.4 oz (73.7 kg)  04/24/24 161 lb 3.2 oz (73.1 kg)     Physical Exam Constitutional:      Appearance: Normal appearance.  Cardiovascular:     Rate and Rhythm: Normal rate and regular rhythm.     Heart sounds: Normal heart sounds.  Pulmonary:     Effort: Pulmonary effort is normal.     Breath sounds: Normal breath sounds.  Musculoskeletal:     Right lower leg: No edema.     Left lower leg: No edema.  Neurological:     Mental Status: She is alert.       EKG:   Recent Labs: 02/21/2024: ALT 12; BUN 11; Creatinine, Ser 0.69; Hemoglobin 13.7; Platelets 271; Potassium 4.8; Sodium 140 04/13/2024: TSH 0.030    Lipid Panel    Component Value Date/Time   CHOL 98 (L) 02/21/2024 1116   TRIG 90 02/21/2024 1116   HDL 36 (L) 02/21/2024 1116   CHOLHDL 2.7 02/21/2024 1116   LDLCALC 44 02/21/2024 1116      Other studies Reviewed: Additional studies/ records that were reviewed today include:  Review of the above records demonstrates:       No data to display            ASSESSMENT AND PLAN:    ICD-10-CM   1. Atrial fibrillation, currently in sinus rhythm  Z86.79    seems to be stable    2. Benign essential hypertension  I10     3. Chronic diastolic congestive heart failure (HCC)  I50.32     4. Nonrheumatic mitral valve regurgitation  I34.0     5. SOB (shortness of breath)  R06.02    stress test and echo, were fine execept mild MR on5/25.       Problem List Items Addressed This Visit       Cardiovascular and Mediastinum   Benign essential hypertension   Chronic diastolic congestive heart failure (HCC)   Other Visit Diagnoses       Atrial fibrillation, currently in sinus rhythm    -  Primary    seems to be stable  Nonrheumatic mitral valve regurgitation         SOB (shortness of breath)       stress test and echo, were fine execept mild MR on5/25.          Disposition:   Return in about 3 months (around 09/01/2024).    Total time spent: 30 minutes  Signed,  Denyse Bathe, MD  06/01/2024 10:09 AM    [facility]

## 2024-06-02 ENCOUNTER — Telehealth: Payer: Self-pay | Admitting: Family

## 2024-06-02 NOTE — Telephone Encounter (Signed)
 Patient left VM requesting Alan send her in something for pain for her wrist. She seen Alan last week for the wrist and all her x-rays were normal but she's still having a lot of pain. Please advise.

## 2024-06-03 DIAGNOSIS — M546 Pain in thoracic spine: Secondary | ICD-10-CM | POA: Diagnosis not present

## 2024-06-03 DIAGNOSIS — Z79899 Other long term (current) drug therapy: Secondary | ICD-10-CM | POA: Diagnosis not present

## 2024-06-03 DIAGNOSIS — M5416 Radiculopathy, lumbar region: Secondary | ICD-10-CM | POA: Diagnosis not present

## 2024-06-03 DIAGNOSIS — M25552 Pain in left hip: Secondary | ICD-10-CM | POA: Diagnosis not present

## 2024-06-03 DIAGNOSIS — G8929 Other chronic pain: Secondary | ICD-10-CM | POA: Diagnosis not present

## 2024-06-03 DIAGNOSIS — I4891 Unspecified atrial fibrillation: Secondary | ICD-10-CM | POA: Diagnosis not present

## 2024-06-03 DIAGNOSIS — Z6831 Body mass index (BMI) 31.0-31.9, adult: Secondary | ICD-10-CM | POA: Diagnosis not present

## 2024-06-03 DIAGNOSIS — Z9181 History of falling: Secondary | ICD-10-CM | POA: Diagnosis not present

## 2024-06-03 DIAGNOSIS — E6609 Other obesity due to excess calories: Secondary | ICD-10-CM | POA: Diagnosis not present

## 2024-06-08 DIAGNOSIS — Z79899 Other long term (current) drug therapy: Secondary | ICD-10-CM | POA: Diagnosis not present

## 2024-06-14 ENCOUNTER — Encounter: Payer: Self-pay | Admitting: Family

## 2024-06-14 NOTE — Assessment & Plan Note (Signed)
 Rechecking TSH, T3 and T4 levels today.  Will call with results when available.

## 2024-06-24 NOTE — Progress Notes (Signed)
 Established Patient Office Visit  Subjective:  Patient ID: Evelyn Bentley, female    DOB: 11/02/1956  Age: 68 y.o. MRN: [id number]  Chief Complaint  Patient presents with   Follow-up    1 month follow up    Patient is here today for her 1 month follow up.  She has been feeling fairly well since last appointment.   She does not have additional concerns to discuss today.  She has been set up with endocrine, will see what they have to say with regard to her thyroid .   Labs are not due today.  She needs refills.   I have reviewed her active problem list, medication list, allergies, notes from last encounter, lab results for her appointment today.      No other concerns at this time.   Past Medical History:  Diagnosis Date   Abnormal drug screen (03/04/2023) 03/25/2023   (03/04/2023) UDS (+) for carboxy-THC (marijuana)      Abnormal MRI, cervical spine (05/10/2021) 03/25/2023   (05/10/2021) CERVICAL MRI FINDINGS:  Cord: Mild cord hyperintensity at C5, unchanged.     DISC LEVELS:  C2-3: Mild left foraminal narrowing due to facet hypertrophy  C3-4: Small central disc protrusion unchanged. Mild spinal stenosis. Bilateral facet degeneration.  C4-5: ACDF with anterior plate. Mild left foraminal narrowing.  C5-6: Solid interbody fusion without plate.  C6-7: Solid interbody fusi   Abnormal MRI, lumbar spine (12/11/2022) 03/04/2023   (12/11/2022) LUMBAR MRI FINDINGS:  Alignment:  Mild degenerative anterolisthesis at L3-4 and L4-5     DISC LEVELS:  T12- L1: Mild progression of chronic central protrusion.  L1-2: Small left paracentral protrusion with mild interval regression.  L2-3: Disc bulging eccentric to the left with inferior foraminal protrusion. Mild facet spurring. Mild left foraminal narrowing.  L3-4: Degenerative facet    Abnormal NCS (nerve conduction studies) 03/04/2023   Upper extremity EMG IMPRESSION: Abnormal study. 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      There is an electrodiagnostic and sonographic evidence of bilateral median neuropathies at the wrists (i.e carpal tunnel syndrome) and electrodiagnostic and s   Anxiety    Arthritis    Bursitis    Rt hip   Cervical facet joint pain 03/04/2023   Closed fracture of patella 05/06/2017   Complication of anesthesia    a.) PONV. 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   Family history of adverse reaction to anesthesia    a.) mother - severe PONV   Fibromyalgia    GERD (gastroesophageal reflux disease)    H/O arthritis 03/06/2015   H/O: hypothyroidism 03/06/2015   History of 2019 novel coronavirus disease (COVID-19) 12/20/2020   a.) preoperative PCR (+) on 12/20/2020; retested following day per pt/surgeon request; PCR once again (+) on 12/21/2020   History of carpal tunnel release of wrists (Bilateral) 03/04/2023   History of fusion of cervical spine (C4-C7) 03/04/2023   History of kidney stones    History of marijuana use 03/25/2023   (03/04/2023) UDS (+) for carboxy-THC (marijuana)      History of migraine headaches 05/02/2015   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   Hypercholesteremia    Hypertension    Hypothyroidism    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   Knee pain 05/06/2017   PONV (postoperative nausea and vomiting)  Pre-diabetes    Sepsis due to urinary tract infection (HCC) 07/02/2016   Sleep apnea    MILD- DOESN'T NEED cpap   Status post total hip replacement, 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   Thyroid  disease    Weakness of limb 05/06/2017    Past Surgical History:  Procedure Laterality Date   ABDOMINAL HYSTERECTOMY     ANTERIOR INTEROSSEOUS NERVE DECOMPRESSION Left 01/05/2021   Procedure: Cubital tunnel release, left;  Surgeon: Kathlynn Sharper, MD;  Location: [facility];   Service: Orthopedics;  Laterality: Left;   CARPAL TUNNEL RELEASE Right 09/01/2020   Procedure: Right carpal tunnel release;  Surgeon: Kathlynn Sharper, MD;  Location: [facility];  Service: Orthopedics;  Laterality: Right;   CARPAL TUNNEL RELEASE Left 01/05/2021   Procedure: Carpal tunnel release, left;  Surgeon: Kathlynn Sharper, MD;  Location: [facility];  Service: Orthopedics;  Laterality: Left;   CATARACT EXTRACTION, BILATERAL     CHOLECYSTECTOMY     COLONOSCOPY WITH PROPOFOL  N/A 11/13/2016   Procedure: COLONOSCOPY WITH PROPOFOL ;  Surgeon: Gladis RAYMOND Mariner, MD;  Location: [facility] END[facility]COPY;  Service: Endoscopy;  Laterality: N/A;   ESOPHAGOGASTRODUODENOSCOPY (EGD) WITH PROPOFOL  N/A 11/13/2016   Procedure: ESOPHAGOGASTRODUODENOSCOPY (EGD) WITH PROPOFOL ;  Surgeon: Gladis RAYMOND Mariner, MD;  Location: [facility] ENDOSCOPY;  Service: Endoscopy;  Laterality: N/A;   ESOPHAGOGASTRODUODENOSCOPY (EGD) WITH PROPOFOL  N/A 12/16/2018   Procedure: ESOPHAGOGASTRODUODENOSCOPY (EGD) WITH PROPOFOL ;  Surgeon: Mariner Gladis RAYMOND, MD;  Location: [facility] ENDOSCOPY;  Service: Endoscopy;  Laterality: N/A;   fundoflication  2015   at [facility] hospital   LAPAROSCOPIC HYSTERECTOMY Bilateral 02/28/2016   Procedure: HYSTERECTOMY TOTAL LAPAROSCOPIC / BSO;  Surgeon: Mitzie BROCKS Ward, MD;  Location: [facility];  Service: Gynecology;  Laterality: Bilateral;   NECK SURGERY     X 2   NISSEN FUNDOPLICATION     TOTAL HIP ARTHROPLASTY Right 01/02/2022   Procedure: TOTAL HIP ARTHROPLASTY ANTERIOR APPROACH;  Surgeon: Kathlynn Sharper, MD;  Location: [facility];  Service: Orthopedics;  Laterality: Right;   TUBAL LIGATION     URETEROSCOPY WITH HOLMIUM LASER LITHOTRIPSY Left 07/03/2016   Procedure: URETEROSCOPY WITH HOLMIUM LASER LITHOTRIPSY;  Surgeon: Sharper JONELLE Burkes, MD;  Location: [facility];  Service: Urology;  Laterality: Left;    Social History   Socioeconomic History   Marital status: Widowed    Spouse name: Not on file   Number of children: 2   Years of  education: Not on file   Highest education level: High school graduate  Occupational History    Comment: fulltime  Tobacco Use   Smoking status: Former    Current packs/day: 0.00    Types: Cigarettes    Start date: 11/14/1974    Quit date: 05/14/2014    Years since quitting: 10.1   Smokeless tobacco: Never  Vaping Use   Vaping status: Every Day   Substances: Nicotine  Substance and Sexual Activity   Alcohol  use: Not Currently    Alcohol /week: 0.0 - 1.0 standard drinks of alcohol    Drug use: No   Sexual activity: Yes    Partners: Male    Birth control/protection: Condom  Other Topics Concern   Not on file  Social History Narrative   Not on file   Social Drivers of Health   Financial Resource Strain: Low Risk  (10/18/2023)   Overall Financial Resource Strain (CARDIA)    Difficulty of Paying Living Expenses: Not very hard  Food Insecurity: No Food Insecurity (04/24/2024)   Received from [facility]   Hunger Vital Sign  Within the past 12 months, you worried that your food would run out before you got the money to buy more.: Never true    Within the past 12 months, the food you bought just didn't last and you didn't have money to get more.: Never true  Transportation Needs: No Transportation Needs (04/24/2024)   Received from [facility] - Transportation    In the past 12 months, has lack of transportation kept you from medical appointments or from getting medications?: No    In the past 12 months, has lack of transportation kept you from meetings, work, or from getting things needed for daily living?: No  Physical Activity: Inactive (10/18/2023)   Exercise Vital Sign    Days of Exercise per Week: 0 days    Minutes of Exercise per Session: 0 min  Stress: Stress Concern Present (10/18/2023)   [employer] of Occupational Health - Occupational Stress Questionnaire    Feeling of Stress : Very much  Social Connections: Socially Isolated (10/18/2023)   Social  Connection and Isolation Panel    Frequency of Communication with Friends and Family: Never    Frequency of Social Gatherings with Friends and Family: More than three times a week    Attends Religious Services: Never    Database administrator or Organizations: No    Attends Banker Meetings: Never    Marital Status: Widowed  Intimate Partner Violence: Not At Risk (10/18/2023)   Humiliation, Afraid, Rape, and Kick questionnaire    Fear of Current or Ex-Partner: No    Emotionally Abused: No    Physically Abused: No    Sexually Abused: No    Family History  Problem Relation Age of Onset   Depression Mother    Anxiety disorder Mother    Breast cancer Neg Hx     Allergies  Allergen Reactions   Codeine Nausea Only    Review of Systems  All other systems reviewed and are negative.      Objective:   BP 120/66   Pulse (!) 57   Ht 5' (1.524 m)   Wt 161 lb 3.2 oz (73.1 kg)   SpO2 96%   BMI 31.48 kg/m   Vitals:   04/24/24 1126  BP: 120/66  Pulse: (!) 57  Height: 5' (1.524 m)  Weight: 161 lb 3.2 oz (73.1 kg)  SpO2: 96%  BMI (Calculated): 31.48    Physical Exam Vitals and nursing note reviewed.  Constitutional:      Appearance: Normal appearance. She is normal weight.  HENT:     Head: Normocephalic.  Eyes:     Extraocular Movements: Extraocular movements intact.     Conjunctiva/sclera: Conjunctivae normal.     Pupils: Pupils are equal, round, and reactive to light.  Cardiovascular:     Rate and Rhythm: Normal rate.  Pulmonary:     Effort: Pulmonary effort is normal.  Neurological:     General: No focal deficit present.     Mental Status: She is alert and oriented to person, place, and time. Mental status is at baseline.  Psychiatric:        Mood and Affect: Mood normal.        Behavior: Behavior normal.        Thought Content: Thought content normal.      No results found for any visits on 04/24/24.  Recent Results (from the past 2160  hours)  Iron, TIBC and Ferritin Panel  Status: None   Collection Time: 04/13/24 11:45 AM  Result Value Ref Range   Total Iron Binding Capacity 368 250 - 450 ug/dL   UIBC 704 881 - 630 ug/dL   Iron 73 27 - 860 ug/dL   Iron Saturation 20 15 - 55 %   Ferritin 27 15 - 150 ng/mL  TSH+T4F+T3Free     Status: Abnormal   Collection Time: 04/13/24 11:45 AM  Result Value Ref Range   TSH 0.030 (L) 0.450 - 4.500 uIU/mL   T3, Free 3.0 2.0 - 4.4 pg/mL   Free T4 1.38 0.82 - 1.77 ng/dL       Assessment & Plan Acquired hypothyroidism Patient is seen by endocrinology, who manage this condition.  She is well controlled with current therapy.   Will defer to them for further changes to plan of care.     No follow-ups on file.   Total time spent: 20 minutes  ALAN CHRISTELLA ARRANT, FNP  04/24/2024   This document may have been prepared by [facility] Voice Recognition software and as such may include unintentional dictation errors.

## 2024-06-24 NOTE — Assessment & Plan Note (Signed)
 Patient is seen by endocrinology, who manage this condition.  She is well controlled with current therapy.   Will defer to them for further changes to plan of care.

## 2024-06-26 ENCOUNTER — Other Ambulatory Visit: Payer: Self-pay | Admitting: Cardiovascular Disease

## 2024-06-26 DIAGNOSIS — I5032 Chronic diastolic (congestive) heart failure: Secondary | ICD-10-CM

## 2024-06-26 DIAGNOSIS — R0789 Other chest pain: Secondary | ICD-10-CM

## 2024-06-26 DIAGNOSIS — R0602 Shortness of breath: Secondary | ICD-10-CM

## 2024-06-26 DIAGNOSIS — Z8679 Personal history of other diseases of the circulatory system: Secondary | ICD-10-CM

## 2024-06-26 DIAGNOSIS — R002 Palpitations: Secondary | ICD-10-CM

## 2024-07-03 DIAGNOSIS — R7303 Prediabetes: Secondary | ICD-10-CM | POA: Diagnosis not present

## 2024-07-03 DIAGNOSIS — M25552 Pain in left hip: Secondary | ICD-10-CM | POA: Diagnosis not present

## 2024-07-03 DIAGNOSIS — M546 Pain in thoracic spine: Secondary | ICD-10-CM | POA: Diagnosis not present

## 2024-07-03 DIAGNOSIS — M5416 Radiculopathy, lumbar region: Secondary | ICD-10-CM | POA: Diagnosis not present

## 2024-07-03 DIAGNOSIS — G8929 Other chronic pain: Secondary | ICD-10-CM | POA: Diagnosis not present

## 2024-07-03 DIAGNOSIS — Z9181 History of falling: Secondary | ICD-10-CM | POA: Diagnosis not present

## 2024-07-03 DIAGNOSIS — Z79899 Other long term (current) drug therapy: Secondary | ICD-10-CM | POA: Diagnosis not present

## 2024-07-03 DIAGNOSIS — E6609 Other obesity due to excess calories: Secondary | ICD-10-CM | POA: Diagnosis not present

## 2024-07-03 DIAGNOSIS — K5900 Constipation, unspecified: Secondary | ICD-10-CM | POA: Diagnosis not present

## 2024-07-08 DIAGNOSIS — Z79899 Other long term (current) drug therapy: Secondary | ICD-10-CM | POA: Diagnosis not present

## 2024-07-28 ENCOUNTER — Ambulatory Visit: Admitting: Family

## 2024-09-01 ENCOUNTER — Other Ambulatory Visit: Payer: Self-pay | Admitting: Cardiovascular Disease

## 2024-09-01 DIAGNOSIS — R0789 Other chest pain: Secondary | ICD-10-CM

## 2024-09-01 DIAGNOSIS — R002 Palpitations: Secondary | ICD-10-CM

## 2024-09-01 DIAGNOSIS — R0602 Shortness of breath: Secondary | ICD-10-CM

## 2024-09-01 DIAGNOSIS — R42 Dizziness and giddiness: Secondary | ICD-10-CM

## 2024-09-01 DIAGNOSIS — Z8679 Personal history of other diseases of the circulatory system: Secondary | ICD-10-CM

## 2024-09-01 DIAGNOSIS — I5032 Chronic diastolic (congestive) heart failure: Secondary | ICD-10-CM

## 2024-09-01 DIAGNOSIS — I10 Essential (primary) hypertension: Secondary | ICD-10-CM

## 2024-09-07 ENCOUNTER — Ambulatory Visit: Admitting: Cardiovascular Disease

## 2024-09-13 ENCOUNTER — Emergency Department

## 2024-09-13 ENCOUNTER — Emergency Department
Admission: EM | Admit: 2024-09-13 | Discharge: 2024-09-13 | Disposition: A | Discharge status: Home / Self Care | Attending: Emergency Medicine | Admitting: Emergency Medicine

## 2024-09-13 DIAGNOSIS — R002 Palpitations: Secondary | ICD-10-CM | POA: Insufficient documentation

## 2024-09-13 DIAGNOSIS — Z7901 Long term (current) use of anticoagulants: Secondary | ICD-10-CM | POA: Insufficient documentation

## 2024-09-13 DIAGNOSIS — R079 Chest pain, unspecified: Secondary | ICD-10-CM

## 2024-09-13 DIAGNOSIS — R0789 Other chest pain: Secondary | ICD-10-CM | POA: Diagnosis present

## 2024-09-13 LAB — BASIC METABOLIC PANEL WITH GFR
Anion gap: 9 (ref 5–15)
BUN: 10 mg/dL (ref 8–23)
CO2: 25 mmol/L (ref 22–32)
Calcium: 8.9 mg/dL (ref 8.9–10.3)
Chloride: 104 mmol/L (ref 98–111)
Creatinine, Ser: 0.71 mg/dL (ref 0.44–1.00)
GFR, Estimated: >60 mL/min (ref >60)
Glucose, Bld: 159 mg/dL — ABNORMAL HIGH (ref 70–99)
Potassium: 3.5 mmol/L (ref 3.5–5.1)
Sodium: 138 mmol/L (ref 135–145)

## 2024-09-13 LAB — TROPONIN I (HIGH SENSITIVITY)
Troponin I (High Sensitivity): 3 ng/L
Troponin I (High Sensitivity): 3 ng/L (ref <18)

## 2024-09-13 LAB — PROTIME-INR
INR: 1.1 (ref 0.8–1.2)
Prothrombin Time: 15.2 s (ref 11.4–15.2)

## 2024-09-13 LAB — CBC
HCT: 39.9 % (ref 36.0–46.0)
Hemoglobin: 13.3 g/dL (ref 12.0–15.0)
MCH: 28.7 pg (ref 26.0–34.0)
MCHC: 33.3 g/dL (ref 30.0–36.0)
MCV: 86.2 fL (ref 80.0–100.0)
Platelets: 246 K/uL (ref 150–400)
RBC: 4.63 MIL/uL (ref 3.87–5.11)
RDW: 12.7 % (ref 11.5–15.5)
WBC: 6.8 K/uL (ref 4.0–10.5)
nRBC: 0 % (ref 0.0–0.2)

## 2024-09-13 MED ORDER — ACETAMINOPHEN 500 MG PO TABS
1000.0000 mg | ORAL_TABLET | Freq: Once | ORAL | Status: AC
  Administered 2024-09-13: 1000 mg via ORAL
  Filled 2024-09-13: qty 2

## 2024-09-13 NOTE — ED Triage Notes (Signed)
 Pt presents to the ED via POV from home with chest pressure and heart palpitations. Hx of afib. Pt takes eliquis  and propanolol. Pt denies missing doses. Pt A&Ox4.

## 2024-09-13 NOTE — ED Provider Notes (Signed)
 [facility] Provider Note    Event Date/Time   First MD Initiated Contact with Patient 09/13/24 1514     (approximate)   History   Chest Pain   HPI  Evelyn Bentley is a 68 y.o. female with history of A-fib on Eliquis  who comes in with concerns for chest pressure and palpitations.  Patient reports been compliant with her Eliquis  and her propranolol .  I reviewed the office visit from 06/01/2024 where patient seen by Dr. Deretha for palpitations where patient is on propranolol  20 mg twice daily, Eliquis .  Patient reports chest pressure and palpitations that started today around noon.  She reports that the chest pressure non radiating has significantly improved but still little bit is lingering.  She denies any continued palpitations.  She reports being worried that she could have been in A-fib.  She denies any new shortness of breath, leg swelling, pain in her abdomen, falls that hit her head or any other concerns. Denies any abdominal pain, numbness, tingling  Physical Exam   Triage Vital Signs: ED Triage Vitals a  Encounter Vitals Group     BP 135/66     Girls Systolic BP Percentile      Girls Diastolic BP Percentile      Boys Systolic BP Percentile      Boys Diastolic BP Percentile      Pulse Rate 74     Resp 18     Temp 97.9 F (36.6 C)     Temp Source Oral     SpO2 97 %     Weight 168 lb (76.2 kg)     Height 5' (1.524 m)     Head Circumference      Peak Flow      Pain Score 5     Pain Loc      Pain Education      Exclude from Growth Chart     Most recent vital signs: Vitals:   09/13/24 1348  BP: 135/66  Pulse: 74  Resp: 18  Temp: 97.9 F (36.6 C)  SpO2: 97%     General: Awake, no distress.  CV:  Good peripheral perfusion.  No murmur Resp:  Normal effort.  Clear lung Abd:  No distention.  Soft and nontender Other:  Sensation intact throughout warm and well-perfused good pulses that are equal in both DP and radial    ED Results  / Procedures / Treatments   Labs (all labs ordered are listed, but only abnormal results are displayed) Labs Reviewed  BASIC METABOLIC PANEL WITH GFR - Abnormal; Notable for the following components:      Result Value   Glucose, Bld 159 (*)    All other components within normal limits  CBC  PROTIME-INR  TROPONIN I (HIGH SENSITIVITY)     EKG  My interpretation of EKG:  Normal sinus rate 74 without any ST elevation or T wave inversions, normal intervals.  RADIOLOGY I have reviewed the xray personally and interpreted no evidence of any pneumonia   PROCEDURES:  Critical Care performed: No  .1-3 Lead EKG Interpretation  Performed by: Ernest Ronal BRAVO, MD Authorized by: Ernest Ronal BRAVO, MD     Interpretation: normal     ECG rate:  70   ECG rate assessment: normal     Rhythm: sinus rhythm     Ectopy: none     Conduction: normal      MEDICATIONS ORDERED IN ED: Medications  acetaminophen  (TYLENOL ) tablet  1,000 mg (1,000 mg Oral Given 09/13/24 1545)     IMPRESSION / MDM / ASSESSMENT AND PLAN / ED COURSE  I reviewed the triage vital signs and the nursing notes.   Patient's presentation is most consistent with acute presentation with potential threat to life or bodily function.  Differential includes ACS, atrial fibrillation, Electra abnormalities.  No upper abdominal pain to suggest Coley cystitis choledocholithiasis.  She has been compliant with Eliquis  denies any shortness of breath suggest PE.  Patient reports a pressure sensation is nonradiating without any numbness or tingling no evidence of dissection based on history and physical exam.  Will keep patient in the cardiac monitor. Patient is currently in normal sinus.  Workup was done to evaluate for ACS.  INR reassuring BMP reassuring CBC reassuring troponin is negative  Will get a second troponin given onset of timing.  Discussed with patient I would be hesitant to go up on her propranolol  given she reports her  baseline heart rates are between 60 and 70.  Discussed that she should make an appoint with Dr. Gleda office tomorrow to discuss these palpitations as it is deafly possible she could have gone into A-fib earlier in his back out of it.  She continues to have these episodes they may need to adjust her medications or they may want to get a monitor to ensure and calculate how often she is going into A-fib to decide on next steps.  Patient expressed understanding felt comfortable with this plan  4:26 PM repeat troponin is negative patient remains in normal sinus on the monitor.  She reports that the pressure sensation has gone away with tylenol .  We discussed that I cannot predict future heart attacks and that she has a change in symptoms or worsening symptoms that she would need to return to the ER for repeat evaluation otherwise she can call her cardiology office tomorrow and she expressed understanding felt comfortable with plan  The patient is on the cardiac monitor to evaluate for evidence of arrhythmia and/or significant heart rate changes.      FINAL CLINICAL IMPRESSION(S) / ED DIAGNOSES   Final diagnoses:  Palpitations  Nonspecific chest pain     Rx / DC Orders   ED Discharge Orders     None        Note:  This document was prepared using Dragon voice recognition software and may include unintentional dictation errors.   Ernest Ronal BRAVO, MD 09/13/24 [phone]

## 2024-09-13 NOTE — Discharge Instructions (Addendum)
 Your workup today was reassuring without evidence of heart attack, atrial fibrillation but you should return to the ER if you develop continued palpitations that are not going away, chest pain, shortness of breath, lightheadness or any other concerns.  Otherwise please call Dr. Kathern office tomorrow to make a follow-up appointment

## 2024-09-25 ENCOUNTER — Ambulatory Visit: Payer: Self-pay | Admitting: Cardiovascular Disease

## 2024-09-25 ENCOUNTER — Encounter: Payer: Self-pay | Admitting: Cardiovascular Disease

## 2024-09-25 ENCOUNTER — Ambulatory Visit (INDEPENDENT_AMBULATORY_CARE_PROVIDER_SITE_OTHER): Admitting: Cardiovascular Disease

## 2024-09-25 VITALS — BP 128/70 | HR 66 | Ht 60.0 in | Wt 168.2 lb

## 2024-09-25 DIAGNOSIS — Z8679 Personal history of other diseases of the circulatory system: Secondary | ICD-10-CM | POA: Insufficient documentation

## 2024-09-25 DIAGNOSIS — R002 Palpitations: Secondary | ICD-10-CM | POA: Insufficient documentation

## 2024-09-25 DIAGNOSIS — R0602 Shortness of breath: Secondary | ICD-10-CM

## 2024-09-25 DIAGNOSIS — R0789 Other chest pain: Secondary | ICD-10-CM | POA: Diagnosis not present

## 2024-09-25 DIAGNOSIS — Z131 Encounter for screening for diabetes mellitus: Secondary | ICD-10-CM

## 2024-09-25 DIAGNOSIS — Z0130 Encounter for examination of blood pressure without abnormal findings: Secondary | ICD-10-CM

## 2024-09-25 DIAGNOSIS — I5032 Chronic diastolic (congestive) heart failure: Secondary | ICD-10-CM

## 2024-09-25 DIAGNOSIS — I340 Nonrheumatic mitral (valve) insufficiency: Secondary | ICD-10-CM | POA: Insufficient documentation

## 2024-09-25 DIAGNOSIS — I200 Unstable angina: Secondary | ICD-10-CM

## 2024-09-25 DIAGNOSIS — R42 Dizziness and giddiness: Secondary | ICD-10-CM | POA: Insufficient documentation

## 2024-09-25 NOTE — Progress Notes (Signed)
 Cardiology Office Note   Date:  09/25/2024   ID:  Evelyn Bentley, DOB 1956/01/17, MRN [id number]  PCP:  Evelyn Alan HERO, FNP  Cardiologist:  Denyse Bathe, MD      History of Present Illness: Evelyn Bentley is a 68 y.o. female who presents for  Chief Complaint  Patient presents with   Follow-up    Recent ER visit with chest pain. Palpations with chest pressure.    [id number] had chest pain 2 weeks ago. She went to ER and MI was ruled out. Was told may have gone to afib, but EKG however was NSR.  Patient was seen in the emergency room on November 2 and 2 sets of troponins were negative.  EKG showed normal sinus rhythm nonspecific ST-T changes.  Nuclear stress test in May 2025 with normal.  Echo showed mild mitral regurgitation normal ejection fraction.  Patient has anginal type of chest pain at rest associated with shortness of breath and palpitation without any evidence of paroxysmal atrial fibrillation at the time of the symptoms.  Thus patient probably has unstable angina and will set up the patient for cardiac catheterization.  Chest Pain  This is a new problem. The current episode started 1 to 4 weeks ago. The problem has been rapidly worsening. The pain is at a severity of 6/10. The quality of the pain is described as tightness and pressure. The pain does not radiate. Associated symptoms include dizziness, malaise/fatigue, nausea, near-syncope, palpitations and shortness of breath. Pertinent negatives include no numbness, syncope, vomiting or weakness.      Past Medical History:  Diagnosis Date   Abnormal drug screen (03/04/2023) 03/25/2023   (03/04/2023) UDS (+) for carboxy-THC (marijuana)      Abnormal MRI, cervical spine (05/10/2021) 03/25/2023   (05/10/2021) CERVICAL MRI FINDINGS:  Cord: Mild cord hyperintensity at C5, unchanged.     DISC LEVELS:  C2-3: Mild left foraminal narrowing due to facet hypertrophy  C3-4: Small central disc protrusion unchanged. Mild spinal  stenosis. Bilateral facet degeneration.  C4-5: ACDF with anterior plate. Mild left foraminal narrowing.  C5-6: Solid interbody fusion without plate.  C6-7: Solid interbody fusi   Abnormal MRI, lumbar spine (12/11/2022) 03/04/2023   (12/11/2022) LUMBAR MRI FINDINGS:  Alignment:  Mild degenerative anterolisthesis at L3-4 and L4-5     DISC LEVELS:  T12- L1: Mild progression of chronic central protrusion.  L1-2: Small left paracentral protrusion with mild interval regression.  L2-3: Disc bulging eccentric to the left with inferior foraminal protrusion. Mild facet spurring. Mild left foraminal narrowing.  L3-4: Degenerative facet    Abnormal NCS (nerve conduction studies) 03/04/2023   Upper extremity EMG IMPRESSION: Abnormal study. 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      There is an electrodiagnostic and sonographic evidence of bilateral median neuropathies at the wrists (i.e carpal tunnel syndrome) and electrodiagnostic and s   Anxiety    Arthritis    Bursitis    Rt hip   Cervical facet joint pain 03/04/2023   Closed fracture of patella 05/06/2017   Complication of anesthesia    a.) PONV. 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   Family history of adverse reaction to anesthesia    a.) mother - severe PONV   Fibromyalgia    GERD (gastroesophageal reflux disease)    H/O arthritis 03/06/2015  H/O: hypothyroidism 03/06/2015   History of 2019 novel coronavirus disease (COVID-19) 12/20/2020   a.) preoperative PCR (+) on 12/20/2020; retested following day per pt/surgeon request; PCR once again (+) on 12/21/2020   History of carpal tunnel release of wrists (Bilateral) 03/04/2023   History of fusion of cervical spine (C4-C7) 03/04/2023   History of kidney stones    History of marijuana use 03/25/2023    (03/04/2023) UDS (+) for carboxy-THC (marijuana)      History of migraine headaches 05/02/2015   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   Hypercholesteremia    Hypertension    Hypothyroidism    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   Knee pain 05/06/2017   PONV (postoperative nausea and vomiting)    Pre-diabetes    Sepsis due to urinary tract infection (HCC) 07/02/2016   Sleep apnea    MILD- DOESN'T NEED cpap   Status post total hip replacement, 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   Thyroid  disease    Weakness of limb 05/06/2017     Past Surgical History:  Procedure Laterality Date   ABDOMINAL HYSTERECTOMY     ANTERIOR INTEROSSEOUS NERVE DECOMPRESSION Left 01/05/2021   Procedure: Cubital tunnel release, left;  Surgeon: Kathlynn Sharper, MD;  Location: [facility];  Service: Orthopedics;  Laterality: Left;   CARPAL TUNNEL RELEASE Right 09/01/2020   Procedure: Right carpal tunnel release;  Surgeon: Kathlynn Sharper, MD;  Location: [facility];  Service: Orthopedics;  Laterality: Right;   CARPAL TUNNEL RELEASE Left 01/05/2021   Procedure: Carpal tunnel release, left;  Surgeon: Kathlynn Sharper, MD;  Location: [facility];  Service: Orthopedics;  Laterality: Left;   CATARACT EXTRACTION, BILATERAL     CHOLECYSTECTOMY     COLONOSCOPY WITH PROPOFOL  N/A 11/13/2016   Procedure: COLONOSCOPY WITH PROPOFOL ;  Surgeon: Gladis RAYMOND Mariner, MD;  Location: [facility] ENDOSCOPY;  Service: Endoscopy;  Laterality: N/A;   ESOPHAGOGASTRODUODENOSCOPY (EGD) WITH PROPOFOL  N/A 11/13/2016   Procedure: ESOPHAGOGASTRODUODENOSCOPY (EGD) WITH PROPOFOL ;  Surgeon: Gladis RAYMOND Mariner, MD;  Location: [facility]COPY;  Service: Endoscopy;  Laterality: N/A;   ESOPHAGOGASTRODUODENOSCOPY (EGD) WITH PROPOFOL  N/A 12/16/2018   Procedure: ESOPHAGOGASTRODUODENOSCOPY (EGD) WITH PROPOFOL ;  Surgeon: Mariner Gladis RAYMOND, MD;  Location: [facility] ENDOSCOPY;  Service: Endoscopy;  Laterality: N/A;   fundoflication  2015   at [facility] hospital   LAPAROSCOPIC  HYSTERECTOMY Bilateral 02/28/2016   Procedure: HYSTERECTOMY TOTAL LAPAROSCOPIC / BSO;  Surgeon: Mitzie BROCKS Ward, MD;  Location: [facility];  Service: Gynecology;  Laterality: Bilateral;   NECK SURGERY     X 2   NISSEN FUNDOPLICATION     TOTAL HIP ARTHROPLASTY Right 01/02/2022   Procedure: TOTAL HIP ARTHROPLASTY ANTERIOR APPROACH;  Surgeon: Kathlynn Sharper, MD;  Location: [facility];  Service: Orthopedics;  Laterality: Right;   TUBAL LIGATION     URETEROSCOPY WITH HOLMIUM LASER LITHOTRIPSY Left 07/03/2016   Procedure: URETEROSCOPY WITH HOLMIUM LASER LITHOTRIPSY;  Surgeon: Sharper JONELLE Burkes, MD;  Location: [facility];  Service: Urology;  Laterality: Left;     Current Outpatient Medications  Medication Sig Dispense Refill   acetaminophen  (TYLENOL ) 650 MG CR tablet Take 1,950 mg by mouth every 8 (eight) hours as needed for pain.     chlorpheniramine (CHLOR-TRIMETON) 4 MG tablet Take 4 mg by mouth 2 (two) times daily as needed for allergies.     DULoxetine  (CYMBALTA ) 30 MG capsule Take 1 capsule (30 mg total) by mouth daily. 90 capsule 3   gabapentin  (NEURONTIN ) 300 MG capsule Take 300 mg by mouth 3 (three) times  daily.     levothyroxine  (SYNTHROID ) 100 MCG tablet Take 1 tablet (100 mcg total) by mouth daily. 30 tablet 11   lipase/protease/amylase (CREON ) 36000 UNITS CPEP capsule Take 1 capsule with any snacks, and take 2 capsules with meals. 360 capsule 1   lisinopril -hydrochlorothiazide  (PRINZIDE ,ZESTORETIC ) 10-12.5 MG tablet Take 1 tablet by mouth daily.   2   ondansetron  (ZOFRAN ) 8 MG tablet Take 8 mg by mouth as needed.     oxyCODONE -acetaminophen  (PERCOCET/ROXICET) 5-325 MG tablet Take 1 tablet by mouth 3 (three) times daily as needed.     polyethylene glycol (MIRALAX  / GLYCOLAX ) 17 g packet Take 17 g by mouth daily as needed for mild constipation. 14 each 0   propranolol  (INDERAL ) 20 MG tablet Take 1 tablet (20 mg total) by mouth 2 (two) times daily. 60 tablet 11   rosuvastatin  (CRESTOR ) 20 MG tablet  TAKE 1 TABLET BY MOUTH EVERY DAY 90 tablet 1   JARDIANCE  10 MG TABS tablet TAKE 1 TABLET BY MOUTH EVERY DAY (Patient not taking: Reported on 09/25/2024) 30 tablet 2   No current facility-administered medications for this visit.    Allergies:   Codeine    Social History:   reports that she quit smoking about 10 years ago. Her smoking use included cigarettes. She started smoking about 49 years ago. She has never used smokeless tobacco. She reports that she does not currently use alcohol . She reports that she does not use drugs.   Family History:  family history includes Anxiety disorder in her mother; Depression in her mother.    ROS:     Review of Systems  Constitutional:  Positive for malaise/fatigue.  HENT: Negative.    Eyes: Negative.   Respiratory:  Positive for shortness of breath.   Cardiovascular:  Positive for chest pain, palpitations and near-syncope. Negative for syncope.  Gastrointestinal:  Positive for nausea. Negative for vomiting.  Genitourinary: Negative.   Musculoskeletal: Negative.   Skin: Negative.   Neurological:  Positive for dizziness. Negative for weakness and numbness.  Endo/Heme/Allergies: Negative.   Psychiatric/Behavioral: Negative.    All other systems reviewed and are negative.     All other systems are reviewed and negative.    PHYSICAL EXAM: VS:  BP 128/70   Pulse 66   Ht 5' (1.524 m)   Wt 168 lb 3.2 oz (76.3 kg)   SpO2 98%   BMI 32.85 kg/m  , BMI Body mass index is 32.85 kg/m. Last weight:  Wt Readings from Last 3 Encounters:  09/25/24 168 lb 3.2 oz (76.3 kg)  09/13/24 168 lb (76.2 kg)  06/01/24 162 lb 3.2 oz (73.6 kg)     Physical Exam Constitutional:      Appearance: Normal appearance.  Cardiovascular:     Rate and Rhythm: Normal rate and regular rhythm.     Heart sounds: Normal heart sounds.  Pulmonary:     Effort: Pulmonary effort is normal.     Breath sounds: Normal breath sounds.  Musculoskeletal:     Right lower leg:  No edema.     Left lower leg: No edema.  Neurological:     Mental Status: She is alert.       EKG:   Recent Labs: 02/21/2024: ALT 12 04/13/2024: TSH 0.030 09/13/2024: BUN 10; Creatinine, Ser 0.71; Hemoglobin 13.3; Platelets 246; Potassium 3.5; Sodium 138    Lipid Panel    Component Value Date/Time   CHOL 98 (L) 02/21/2024 1116   TRIG 90 02/21/2024 1116  HDL 36 (L) 02/21/2024 1116   CHOLHDL 2.7 02/21/2024 1116   LDLCALC 44 02/21/2024 1116      Other studies Reviewed: Additional studies/ records that were reviewed today include:  Review of the above records demonstrates:       No data to display            ASSESSMENT AND PLAN:    ICD-10-CM   1. Unstable angina (HCC)  I20.0 Basic metabolic panel with GFR    CBC with Differential/Platelet    Procedural/ Surgical Case Request: LEFT HEART CATH AND CORONARY ANGIOGRAPHY with possible coronary intervention   Patient had stress test in 5/25 which was normal and went to ER with chest pain, troponins negative and  not in in afib    2. Nonrheumatic mitral valve regurgitation  I34.0 Basic metabolic panel with GFR    CBC with Differential/Platelet    Procedural/ Surgical Case Request: LEFT HEART CATH AND CORONARY ANGIOGRAPHY with possible coronary intervention    3. Palpitations  R00.2 Basic metabolic panel with GFR    CBC with Differential/Platelet    Procedural/ Surgical Case Request: LEFT HEART CATH AND CORONARY ANGIOGRAPHY with possible coronary intervention    4. Dizziness  R42 Basic metabolic panel with GFR    CBC with Differential/Platelet    Procedural/ Surgical Case Request: LEFT HEART CATH AND CORONARY ANGIOGRAPHY with possible coronary intervention    5. Atrial fibrillation, currently in sinus rhythm  Z86.79 Basic metabolic panel with GFR    CBC with Differential/Platelet    Procedural/ Surgical Case Request: LEFT HEART CATH AND CORONARY ANGIOGRAPHY with possible coronary intervention    6. Other chest pain   R07.89 Basic metabolic panel with GFR    CBC with Differential/Platelet    Procedural/ Surgical Case Request: LEFT HEART CATH AND CORONARY ANGIOGRAPHY with possible coronary intervention    7. Unstable angina (HCC)  I20.0 Basic metabolic panel with GFR    CBC with Differential/Platelet    Procedural/ Surgical Case Request: LEFT HEART CATH AND CORONARY ANGIOGRAPHY with possible coronary intervention       Problem List Items Addressed This Visit       Cardiovascular and Mediastinum   Nonrheumatic mitral valve regurgitation   Relevant Orders   Basic metabolic panel with GFR   CBC with Differential/Platelet   Procedural/ Surgical Case Request: LEFT HEART CATH AND CORONARY ANGIOGRAPHY with possible coronary intervention   Unstable angina (HCC) - Primary   Relevant Orders   Basic metabolic panel with GFR   CBC with Differential/Platelet   Procedural/ Surgical Case Request: LEFT HEART CATH AND CORONARY ANGIOGRAPHY with possible coronary intervention     Other   Palpitations   Relevant Orders   Basic metabolic panel with GFR   CBC with Differential/Platelet   Procedural/ Surgical Case Request: LEFT HEART CATH AND CORONARY ANGIOGRAPHY with possible coronary intervention   Dizziness   Relevant Orders   Basic metabolic panel with GFR   CBC with Differential/Platelet   Procedural/ Surgical Case Request: LEFT HEART CATH AND CORONARY ANGIOGRAPHY with possible coronary intervention   Atrial fibrillation, currently in sinus rhythm   Relevant Orders   Basic metabolic panel with GFR   CBC with Differential/Platelet   Procedural/ Surgical Case Request: LEFT HEART CATH AND CORONARY ANGIOGRAPHY with possible coronary intervention   Other chest pain   Relevant Orders   Basic metabolic panel with GFR   CBC with Differential/Platelet   Procedural/ Surgical Case Request: LEFT HEART CATH AND CORONARY ANGIOGRAPHY with  possible coronary intervention       Disposition:   Return in about 1 week  (around 10/02/2024) for set up cath next tuesday later in afternoon af.    Total time spent: 50 minutes  Signed,  Denyse Bathe, MD  09/25/2024 9:41 AM    [facility]

## 2024-09-26 LAB — CBC WITH DIFFERENTIAL/PLATELET
Basophils Absolute: 0 x10E3/uL (ref 0.0–0.2)
Basos: 1 %
EOS (ABSOLUTE): 0.2 x10E3/uL (ref 0.0–0.4)
Eos: 4 %
Hematocrit: 38.9 % (ref 34.0–46.6)
Hemoglobin: 12.7 g/dL (ref 11.1–15.9)
Immature Grans (Abs): 0 x10E3/uL (ref 0.0–0.1)
Immature Granulocytes: 0 %
Lymphocytes Absolute: 1.2 x10E3/uL (ref 0.7–3.1)
Lymphs: 24 %
MCH: 30.8 pg (ref 26.6–33.0)
MCHC: 32.6 g/dL (ref 31.5–35.7)
MCV: 94 fL (ref 79–97)
Monocytes Absolute: 0.5 x10E3/uL (ref 0.1–0.9)
Monocytes: 10 %
Neutrophils Absolute: 3.2 x10E3/uL (ref 1.4–7.0)
Neutrophils: 61 %
Platelets: 288 x10E3/uL (ref 150–450)
RBC: 4.13 x10E6/uL (ref 3.77–5.28)
RDW: 14.2 % (ref 11.7–15.4)
WBC: 5.2 x10E3/uL (ref 3.4–10.8)

## 2024-09-26 LAB — BASIC METABOLIC PANEL WITH GFR
BUN/Creatinine Ratio: 10 — ABNORMAL LOW (ref 12–28)
BUN: 7 mg/dL — ABNORMAL LOW (ref 8–27)
CO2: 23 mmol/L (ref 20–29)
Calcium: 9.3 mg/dL (ref 8.7–10.3)
Chloride: 102 mmol/L (ref 96–106)
Creatinine, Ser: 0.68 mg/dL (ref 0.57–1.00)
Glucose: 97 mg/dL (ref 70–99)
Potassium: 4.7 mmol/L (ref 3.5–5.2)
Sodium: 141 mmol/L (ref 134–144)
eGFR: 96 mL/min/1.73 (ref >59)

## 2024-09-29 ENCOUNTER — Encounter: Payer: Self-pay | Admitting: Anesthesiology

## 2024-09-29 ENCOUNTER — Encounter: Payer: Self-pay | Admitting: Cardiovascular Disease

## 2024-09-29 ENCOUNTER — Encounter
Admission: RE | Disposition: A | Discharge status: Home / Self Care | Payer: Self-pay | Source: Home / Self Care | Attending: Cardiovascular Disease

## 2024-09-29 ENCOUNTER — Ambulatory Visit
Admission: RE | Admit: 2024-09-29 | Discharge: 2024-09-29 | Disposition: A | Discharge status: Home / Self Care | Attending: Cardiovascular Disease | Admitting: Cardiovascular Disease

## 2024-09-29 ENCOUNTER — Other Ambulatory Visit: Payer: Self-pay

## 2024-09-29 DIAGNOSIS — R0602 Shortness of breath: Secondary | ICD-10-CM

## 2024-09-29 DIAGNOSIS — I200 Unstable angina: Secondary | ICD-10-CM | POA: Diagnosis not present

## 2024-09-29 DIAGNOSIS — Z87891 Personal history of nicotine dependence: Secondary | ICD-10-CM | POA: Diagnosis not present

## 2024-09-29 DIAGNOSIS — I4891 Unspecified atrial fibrillation: Secondary | ICD-10-CM | POA: Diagnosis not present

## 2024-09-29 DIAGNOSIS — R0789 Other chest pain: Secondary | ICD-10-CM | POA: Insufficient documentation

## 2024-09-29 DIAGNOSIS — I10 Essential (primary) hypertension: Secondary | ICD-10-CM | POA: Diagnosis not present

## 2024-09-29 DIAGNOSIS — R002 Palpitations: Secondary | ICD-10-CM | POA: Insufficient documentation

## 2024-09-29 DIAGNOSIS — Z79899 Other long term (current) drug therapy: Secondary | ICD-10-CM | POA: Diagnosis not present

## 2024-09-29 DIAGNOSIS — I25110 Atherosclerotic heart disease of native coronary artery with unstable angina pectoris: Secondary | ICD-10-CM | POA: Insufficient documentation

## 2024-09-29 DIAGNOSIS — R42 Dizziness and giddiness: Secondary | ICD-10-CM | POA: Diagnosis present

## 2024-09-29 DIAGNOSIS — I340 Nonrheumatic mitral (valve) insufficiency: Secondary | ICD-10-CM | POA: Insufficient documentation

## 2024-09-29 DIAGNOSIS — Z8679 Personal history of other diseases of the circulatory system: Secondary | ICD-10-CM

## 2024-09-29 DIAGNOSIS — I5032 Chronic diastolic (congestive) heart failure: Secondary | ICD-10-CM

## 2024-09-29 HISTORY — PX: LEFT HEART CATH AND CORONARY ANGIOGRAPHY: CATH118249

## 2024-09-29 SURGERY — LEFT HEART CATH AND CORONARY ANGIOGRAPHY
Anesthesia: Moderate Sedation | Laterality: Left

## 2024-09-29 MED ORDER — LIDOCAINE HCL 1 % IJ SOLN
INTRAMUSCULAR | Status: AC
Start: 2024-09-29 — End: 2024-09-29
  Filled 2024-09-29: qty 20

## 2024-09-29 MED ORDER — LABETALOL HCL 5 MG/ML IV SOLN: 10.0000 mg | INTRAVENOUS | Status: DC | PRN

## 2024-09-29 MED ORDER — MIDAZOLAM HCL 2 MG/2ML IJ SOLN
INTRAMUSCULAR | Status: AC
  Filled 2024-09-29: qty 2

## 2024-09-29 MED ORDER — HYDRALAZINE HCL 20 MG/ML IJ SOLN: 10.0000 mg | INTRAMUSCULAR | Status: DC | PRN

## 2024-09-29 MED ORDER — ACETAMINOPHEN 325 MG PO TABS: 650.0000 mg | ORAL_TABLET | ORAL | Status: DC | PRN

## 2024-09-29 MED ORDER — SODIUM CHLORIDE 0.9% FLUSH: 3.0000 mL | INTRAVENOUS | Status: DC | PRN

## 2024-09-29 MED ORDER — FENTANYL CITRATE (PF) 100 MCG/2ML IJ SOLN
INTRAMUSCULAR | Status: DC | PRN
  Administered 2024-09-29 (×2): 25 ug via INTRAVENOUS

## 2024-09-29 MED ORDER — LIDOCAINE HCL (PF) 1 % IJ SOLN
INTRAMUSCULAR | Status: DC | PRN
  Administered 2024-09-29 (×2): 20 mL

## 2024-09-29 MED ORDER — ONDANSETRON HCL 4 MG/2ML IJ SOLN: 4.0000 mg | Freq: Four times a day (QID) | INTRAMUSCULAR | Status: DC | PRN

## 2024-09-29 MED ORDER — SODIUM CHLORIDE 0.9 % IV SOLN: 250.0000 mL | INTRAVENOUS | Status: DC | PRN

## 2024-09-29 MED ORDER — HEPARIN (PORCINE) IN NACL 1000-0.9 UT/500ML-% IV SOLN
INTRAVENOUS | Status: DC | PRN
Start: 2024-09-29 — End: 2024-09-29
  Administered 2024-09-29: 1000 mL

## 2024-09-29 MED ORDER — HEPARIN (PORCINE) IN NACL 1000-0.9 UT/500ML-% IV SOLN
INTRAVENOUS | Status: AC
  Filled 2024-09-29: qty 1000

## 2024-09-29 MED ORDER — FENTANYL CITRATE (PF) 100 MCG/2ML IJ SOLN
INTRAMUSCULAR | Status: AC
  Filled 2024-09-29: qty 2

## 2024-09-29 MED ORDER — SODIUM CHLORIDE 0.9% FLUSH: 3.0000 mL | Freq: Two times a day (BID) | INTRAVENOUS | Status: DC

## 2024-09-29 MED ORDER — ASPIRIN 81 MG PO CHEW
CHEWABLE_TABLET | ORAL | Status: AC
  Filled 2024-09-29: qty 1

## 2024-09-29 MED ORDER — SODIUM CHLORIDE 0.9 % IV SOLN: INTRAVENOUS | Status: DC

## 2024-09-29 MED ORDER — ASPIRIN 81 MG PO CHEW
81.0000 mg | CHEWABLE_TABLET | ORAL | Status: AC
  Administered 2024-09-29: 81 mg via ORAL

## 2024-09-29 MED ORDER — IOHEXOL 300 MG/ML  SOLN
INTRAMUSCULAR | Status: DC | PRN
Start: 2024-09-29 — End: 2024-09-29
  Administered 2024-09-29: 38 mL

## 2024-09-29 MED ORDER — MIDAZOLAM HCL (PF) 2 MG/2ML IJ SOLN
INTRAMUSCULAR | Status: DC | PRN
Start: 2024-09-29 — End: 2024-09-29
  Administered 2024-09-29: 1 mg via INTRAVENOUS

## 2024-09-29 MED ORDER — SODIUM CHLORIDE 0.9 % WEIGHT BASED INFUSION: 1.0000 mL/kg/h | INTRAVENOUS | Status: DC

## 2024-09-29 MED ORDER — LIDOCAINE HCL 1 % IJ SOLN
INTRAMUSCULAR | Status: AC
  Filled 2024-09-29: qty 20

## 2024-09-29 SURGICAL SUPPLY — 9 items
CATH INFINITI 5FR MULTPACK ANG (CATHETERS) IMPLANT
DEVICE CLOSURE MYNXGRIP 5F (Vascular Products) IMPLANT
NDL PERC 18GX7CM (NEEDLE) IMPLANT
NEEDLE PERC 18GX7CM (NEEDLE) ×1 IMPLANT
PACK CARDIAC CATH (CUSTOM PROCEDURE TRAY) ×2 IMPLANT
SET ATX-X65L (MISCELLANEOUS) IMPLANT
SHEATH AVANTI 5FR X 11CM (SHEATH) IMPLANT
STATION PROTECTION PRESSURIZED (MISCELLANEOUS) IMPLANT
WIRE GUIDERIGHT .035X150 (WIRE) IMPLANT

## 2024-09-30 ENCOUNTER — Encounter: Payer: Self-pay | Admitting: Cardiovascular Disease

## 2024-10-01 ENCOUNTER — Encounter: Payer: Self-pay | Admitting: Cardiovascular Disease

## 2024-10-01 ENCOUNTER — Ambulatory Visit: Admitting: Cardiovascular Disease

## 2024-10-01 VITALS — BP 122/74 | HR 67 | Ht 60.0 in | Wt 165.0 lb

## 2024-10-01 DIAGNOSIS — I340 Nonrheumatic mitral (valve) insufficiency: Secondary | ICD-10-CM | POA: Diagnosis not present

## 2024-10-01 DIAGNOSIS — I5032 Chronic diastolic (congestive) heart failure: Secondary | ICD-10-CM

## 2024-10-01 DIAGNOSIS — R0789 Other chest pain: Secondary | ICD-10-CM | POA: Diagnosis not present

## 2024-10-01 DIAGNOSIS — R0602 Shortness of breath: Secondary | ICD-10-CM

## 2024-10-01 DIAGNOSIS — Z8679 Personal history of other diseases of the circulatory system: Secondary | ICD-10-CM

## 2024-10-01 DIAGNOSIS — I10 Essential (primary) hypertension: Secondary | ICD-10-CM

## 2024-10-01 DIAGNOSIS — I2510 Atherosclerotic heart disease of native coronary artery without angina pectoris: Secondary | ICD-10-CM

## 2024-10-01 DIAGNOSIS — Z131 Encounter for screening for diabetes mellitus: Secondary | ICD-10-CM

## 2024-10-01 MED ORDER — APIXABAN 5 MG PO TABS: 5.0000 mg | ORAL_TABLET | Freq: Two times a day (BID) | ORAL | 2 refills | Status: AC

## 2024-10-01 MED ORDER — ASPIRIN 81 MG PO TBEC: 81.0000 mg | DELAYED_RELEASE_TABLET | Freq: Every day | ORAL | 12 refills | Status: AC

## 2024-10-01 MED ORDER — LISINOPRIL-HYDROCHLOROTHIAZIDE 10-12.5 MG PO TABS
1.0000 | ORAL_TABLET | Freq: Every day | ORAL | 2 refills | Status: AC
Start: 2024-10-01 — End: ?

## 2024-10-01 NOTE — Progress Notes (Signed)
 Cardiology Office Note   Date:  10/01/2024   ID:  Evelyn Bentley, DOB 08-30-1956, MRN [id number]  PCP:  Orlean Alan HERO, FNP  Cardiologist:  Denyse Bathe, MD      History of Present Illness: Evelyn Bentley is a 68 y.o. female who presents for  Chief Complaint  Patient presents with   Follow-up    Heart cath follow up     No further chest pain or SOB.      Past Medical History:  Diagnosis Date   Abnormal drug screen (03/04/2023) 03/25/2023   (03/04/2023) UDS (+) for carboxy-THC (marijuana)      Abnormal MRI, cervical spine (05/10/2021) 03/25/2023   (05/10/2021) CERVICAL MRI FINDINGS:  Cord: Mild cord hyperintensity at C5, unchanged.     DISC LEVELS:  C2-3: Mild left foraminal narrowing due to facet hypertrophy  C3-4: Small central disc protrusion unchanged. Mild spinal stenosis. Bilateral facet degeneration.  C4-5: ACDF with anterior plate. Mild left foraminal narrowing.  C5-6: Solid interbody fusion without plate.  C6-7: Solid interbody fusi   Abnormal MRI, lumbar spine (12/11/2022) 03/04/2023   (12/11/2022) LUMBAR MRI FINDINGS:  Alignment:  Mild degenerative anterolisthesis at L3-4 and L4-5     DISC LEVELS:  T12- L1: Mild progression of chronic central protrusion.  L1-2: Small left paracentral protrusion with mild interval regression.  L2-3: Disc bulging eccentric to the left with inferior foraminal protrusion. Mild facet spurring. Mild left foraminal narrowing.  L3-4: Degenerative facet    Abnormal NCS (nerve conduction studies) 03/04/2023   Upper extremity EMG IMPRESSION: Abnormal study. 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      There is an electrodiagnostic and sonographic evidence of bilateral median neuropathies at the wrists (i.e carpal tunnel syndrome) and electrodiagnostic and s   Anxiety    Arthritis    Bursitis     Rt hip   Cervical facet joint pain 03/04/2023   Closed fracture of patella 05/06/2017   Complication of anesthesia    a.) PONV. 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   Family history of adverse reaction to anesthesia    a.) mother - severe PONV   Fibromyalgia    GERD (gastroesophageal reflux disease)    H/O arthritis 03/06/2015   H/O: hypothyroidism 03/06/2015   History of 2019 novel coronavirus disease (COVID-19) 12/20/2020   a.) preoperative PCR (+) on 12/20/2020; retested following day per pt/surgeon request; PCR once again (+) on 12/21/2020   History of carpal tunnel release of wrists (Bilateral) 03/04/2023   History of fusion of cervical spine (C4-C7) 03/04/2023   History of kidney stones    History of marijuana use 03/25/2023   (03/04/2023) UDS (+) for carboxy-THC (marijuana)      History of migraine headaches 05/02/2015   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   Hypercholesteremia    Hypertension    Hypothyroidism    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   Knee pain 05/06/2017   PONV (postoperative nausea and vomiting)    Pre-diabetes    Sepsis due to urinary tract infection (HCC) 07/02/2016   Sleep apnea    MILD- DOESN'T NEED cpap   Status post total hip replacement, 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   Thyroid  disease    Weakness of limb 05/06/2017     Past Surgical History:  Procedure Laterality Date  ABDOMINAL HYSTERECTOMY     ANTERIOR INTEROSSEOUS NERVE DECOMPRESSION Left 01/05/2021   Procedure: Cubital tunnel release, left;  Surgeon: Kathlynn Sharper, MD;  Location: [facility];  Service: Orthopedics;  Laterality: Left;   CARPAL TUNNEL RELEASE Right 09/01/2020   Procedure: Right carpal tunnel release;  Surgeon: Kathlynn Sharper, MD;  Location: [facility];  Service: Orthopedics;  Laterality: Right;   CARPAL TUNNEL RELEASE Left 01/05/2021   Procedure: Carpal tunnel release, left;  Surgeon: Kathlynn Sharper, MD;   Location: [facility];  Service: Orthopedics;  Laterality: Left;   CATARACT EXTRACTION, BILATERAL     CHOLECYSTECTOMY     COLONOSCOPY WITH PROPOFOL  N/A 11/13/2016   Procedure: COLONOSCOPY WITH PROPOFOL ;  Surgeon: Gladis RAYMOND Mariner, MD;  Location: [facility];  Service: Endoscopy;  Laterality: N/A;   ESOPHAGOGASTRODUODENOSCOPY (EGD) WITH PROPOFOL  N/A 11/13/2016   Procedure: ESOPHAGOGASTRODUODENOSCOPY (EGD) WITH PROPOFOL ;  Surgeon: Gladis RAYMOND Mariner, MD;  Location: [facility];  Service: Endoscopy;  Laterality: N/A;   ESOPHAGOGASTRODUODENOSCOPY (EGD) WITH PROPOFOL  N/A 12/16/2018   Procedure: ESOPHAGOGASTRODUODENOSCOPY (EGD) WITH PROPOFOL ;  Surgeon: Mariner Gladis RAYMOND, MD;  Location: [facility] ENDOSCOPY;  Service: Endoscopy;  Laterality: N/A;   fundoflication  2015   at [facility]   LAPAROSCOPIC HYSTERECTOMY Bilateral 02/28/2016   Procedure: HYSTERECTOMY TOTAL LAPAROSCOPIC / BSO;  Surgeon: Mitzie BROCKS Ward, MD;  Location: [facility];  Service: Gynecology;  Laterality: Bilateral;   LEFT HEART CATH AND CORONARY ANGIOGRAPHY Left 09/29/2024   Procedure: LEFT HEART CATH AND CORONARY ANGIOGRAPHY with possible coronary intervention;  Surgeon: Fernand Denyse LABOR, MD;  Location: [facility];  Service: Cardiovascular;  Laterality: Left;   NECK SURGERY     X 2   NISSEN FUNDOPLICATION     TOTAL HIP ARTHROPLASTY Right 01/02/2022   Procedure: TOTAL HIP ARTHROPLASTY ANTERIOR APPROACH;  Surgeon: Kathlynn Sharper, MD;  Location: [facility];  Service: Orthopedics;  Laterality: Right;   TUBAL LIGATION     URETEROSCOPY WITH HOLMIUM LASER LITHOTRIPSY Left 07/03/2016   Procedure: URETEROSCOPY WITH HOLMIUM LASER LITHOTRIPSY;  Surgeon: Sharper JONELLE Burkes, MD;  Location: [facility];  Service: Urology;  Laterality: Left;     Current Outpatient Medications  Medication Sig Dispense Refill   acetaminophen  (TYLENOL ) 650 MG CR tablet Take 1,300 mg by mouth every 8 (eight) hours as needed for pain.     aspirin  EC 81 MG tablet  Take 1 tablet (81 mg total) by mouth daily. Swallow whole. 30 tablet 12   chlorpheniramine (CHLOR-TRIMETON) 4 MG tablet Take 4 mg by mouth 2 (two) times daily as needed for allergies.     DULoxetine  (CYMBALTA ) 30 MG capsule Take 1 capsule (30 mg total) by mouth daily. 90 capsule 3   gabapentin  (NEURONTIN ) 300 MG capsule Take 300 mg by mouth 3 (three) times daily as needed (pain).     levothyroxine  (SYNTHROID ) 100 MCG tablet Take 1 tablet (100 mcg total) by mouth daily. 30 tablet 11   Melatonin 10 MG CAPS Take 10 mg by mouth at bedtime.     Naloxone HCl (NARCAN NA) Place 1 spray into the nose daily as needed (overdose).     ondansetron  (ZOFRAN ) 8 MG tablet Take 8 mg by mouth as needed.     oxyCODONE -acetaminophen  (PERCOCET/ROXICET) 5-325 MG tablet Take 1 tablet by mouth 3 (three) times daily as needed.     propranolol  (INDERAL ) 20 MG tablet Take 1 tablet (20 mg total) by mouth 2 (two) times daily. 60 tablet 11   rosuvastatin  (CRESTOR ) 20 MG tablet TAKE 1  TABLET BY MOUTH EVERY DAY 90 tablet 1   apixaban  (ELIQUIS ) 5 MG TABS tablet Take 1 tablet (5 mg total) by mouth 2 (two) times daily. 60 tablet 2   lipase/protease/amylase (CREON ) 36000 UNITS CPEP capsule Take 1 capsule with any snacks, and take 2 capsules with meals. (Patient not taking: Reported on 10/01/2024) 360 capsule 1   lisinopril -hydrochlorothiazide  (ZESTORETIC ) 10-12.5 MG tablet Take 1 tablet by mouth daily. 30 tablet 2   No current facility-administered medications for this visit.    Allergies:   Codeine    Social History:   reports that she quit smoking about 10 years ago. Her smoking use included cigarettes. She started smoking about 49 years ago. She has never used smokeless tobacco. She reports that she does not currently use alcohol . She reports that she does not use drugs.   Family History:  family history includes Anxiety disorder in her mother; Depression in her mother.    ROS:     Review of Systems  Constitutional:  Negative.   HENT: Negative.    Eyes: Negative.   Respiratory: Negative.    Gastrointestinal: Negative.   Genitourinary: Negative.   Musculoskeletal: Negative.   Skin: Negative.   Neurological: Negative.   Endo/Heme/Allergies: Negative.   Psychiatric/Behavioral: Negative.    All other systems reviewed and are negative.     All other systems are reviewed and negative.    PHYSICAL EXAM: VS:  BP 122/74   Pulse 67   Ht 5' (1.524 m)   Wt 165 lb (74.8 kg)   SpO2 97%   BMI 32.22 kg/m  , BMI Body mass index is 32.22 kg/m. Last weight:  Wt Readings from Last 3 Encounters:  10/01/24 165 lb (74.8 kg)  09/25/24 168 lb 3.2 oz (76.3 kg)  09/13/24 168 lb (76.2 kg)     Physical Exam Constitutional:      Appearance: Normal appearance.  Cardiovascular:     Rate and Rhythm: Normal rate and regular rhythm.     Heart sounds: Normal heart sounds.  Pulmonary:     Effort: Pulmonary effort is normal.     Breath sounds: Normal breath sounds.  Musculoskeletal:     Right lower leg: No edema.     Left lower leg: No edema.  Neurological:     Mental Status: She is alert.       EKG:   Recent Labs: 02/21/2024: ALT 12 04/13/2024: TSH 0.030 09/25/2024: BUN 7; Creatinine, Ser 0.68; Hemoglobin 12.7; Platelets 288; Potassium 4.7; Sodium 141    Lipid Panel    Component Value Date/Time   CHOL 98 (L) 02/21/2024 1116   TRIG 90 02/21/2024 1116   HDL 36 (L) 02/21/2024 1116   CHOLHDL 2.7 02/21/2024 1116   LDLCALC 44 02/21/2024 1116      Other studies Reviewed: Additional studies/ records that were reviewed today include:  Review of the above records demonstrates:       No data to display            ASSESSMENT AND PLAN:    ICD-10-CM   1. Nonrheumatic mitral valve regurgitation  I34.0 apixaban  (ELIQUIS ) 5 MG TABS tablet    aspirin EC 81 MG tablet    lisinopril -hydrochlorothiazide  (ZESTORETIC ) 10-12.5 MG tablet    2. SOB (shortness of breath)  R06.02 apixaban  (ELIQUIS ) 5 MG  TABS tablet    aspirin EC 81 MG tablet    lisinopril -hydrochlorothiazide  (ZESTORETIC ) 10-12.5 MG tablet    3. Atrial fibrillation, currently in sinus rhythm  Z86.79 apixaban  (ELIQUIS ) 5 MG TABS tablet    aspirin  EC 81 MG tablet    lisinopril -hydrochlorothiazide  (ZESTORETIC ) 10-12.5 MG tablet    4. Other chest pain  R07.89 apixaban  (ELIQUIS ) 5 MG TABS tablet    aspirin  EC 81 MG tablet    lisinopril -hydrochlorothiazide  (ZESTORETIC ) 10-12.5 MG tablet   infrequent    5. Coronary artery disease involving native coronary artery of native heart without angina pectoris  I25.10 apixaban  (ELIQUIS ) 5 MG TABS tablet    aspirin  EC 81 MG tablet    lisinopril -hydrochlorothiazide  (ZESTORETIC ) 10-12.5 MG tablet   40 PERCENT MID LAD, AND OM1, ON CATH. TREAT MEDICALLY. ADD ASP ALONG WITH ELIQUIS     6. Benign essential hypertension  I10 apixaban  (ELIQUIS ) 5 MG TABS tablet    aspirin  EC 81 MG tablet    lisinopril -hydrochlorothiazide  (ZESTORETIC ) 10-12.5 MG tablet    7. Chronic diastolic congestive heart failure (HCC)  I50.32 apixaban  (ELIQUIS ) 5 MG TABS tablet    aspirin  EC 81 MG tablet    lisinopril -hydrochlorothiazide  (ZESTORETIC ) 10-12.5 MG tablet       Problem List Items Addressed This Visit       Cardiovascular and Mediastinum   Benign essential hypertension   Relevant Medications   apixaban  (ELIQUIS ) 5 MG TABS tablet   aspirin  EC 81 MG tablet   lisinopril -hydrochlorothiazide  (ZESTORETIC ) 10-12.5 MG tablet   Chronic diastolic congestive heart failure (HCC)   Relevant Medications   apixaban  (ELIQUIS ) 5 MG TABS tablet   aspirin  EC 81 MG tablet   lisinopril -hydrochlorothiazide  (ZESTORETIC ) 10-12.5 MG tablet   Nonrheumatic mitral valve regurgitation - Primary   Relevant Medications   apixaban  (ELIQUIS ) 5 MG TABS tablet   aspirin  EC 81 MG tablet   lisinopril -hydrochlorothiazide  (ZESTORETIC ) 10-12.5 MG tablet     Other   Atrial fibrillation, currently in sinus rhythm   Relevant  Medications   apixaban  (ELIQUIS ) 5 MG TABS tablet   aspirin  EC 81 MG tablet   lisinopril -hydrochlorothiazide  (ZESTORETIC ) 10-12.5 MG tablet   Other chest pain   Relevant Medications   apixaban  (ELIQUIS ) 5 MG TABS tablet   aspirin  EC 81 MG tablet   lisinopril -hydrochlorothiazide  (ZESTORETIC ) 10-12.5 MG tablet   Other Visit Diagnoses       SOB (shortness of breath)       Relevant Medications   apixaban  (ELIQUIS ) 5 MG TABS tablet   aspirin  EC 81 MG tablet   lisinopril -hydrochlorothiazide  (ZESTORETIC ) 10-12.5 MG tablet     Coronary artery disease involving native coronary artery of native heart without angina pectoris       40 PERCENT MID LAD, AND OM1, ON CATH. TREAT MEDICALLY. ADD ASP ALONG WITH ELIQUIS    Relevant Medications   apixaban  (ELIQUIS ) 5 MG TABS tablet   aspirin  EC 81 MG tablet   lisinopril -hydrochlorothiazide  (ZESTORETIC ) 10-12.5 MG tablet          Disposition:   Return in about 2 months (around 12/01/2024).    Total time spent: 40 minutes  Signed,  Denyse Bathe, MD  10/01/2024 10:15 AM    [facility]

## 2024-10-23 ENCOUNTER — Ambulatory Visit: Admitting: Cardiology

## 2024-10-23 ENCOUNTER — Encounter: Payer: Self-pay | Admitting: Cardiology

## 2024-10-23 VITALS — BP 134/70 | HR 72 | Ht 60.0 in | Wt 168.8 lb

## 2024-10-23 DIAGNOSIS — I10 Essential (primary) hypertension: Secondary | ICD-10-CM

## 2024-10-23 DIAGNOSIS — Z131 Encounter for screening for diabetes mellitus: Secondary | ICD-10-CM

## 2024-10-23 DIAGNOSIS — E039 Hypothyroidism, unspecified: Secondary | ICD-10-CM

## 2024-10-23 DIAGNOSIS — E782 Mixed hyperlipidemia: Secondary | ICD-10-CM | POA: Diagnosis not present

## 2024-10-23 MED ORDER — CELECOXIB 200 MG PO CAPS: 200.0000 mg | ORAL_CAPSULE | Freq: Every day | ORAL | 2 refills | Status: AC

## 2024-10-23 NOTE — Progress Notes (Signed)
 Established Patient Office Visit  Subjective:  Patient ID: Evelyn Bentley, female    DOB: [birth year]  Age: 68 y.o. MRN: [id number]  Chief Complaint  Patient presents with   Acute Visit    Pain in right shoulder and both hands. Last time she saw amanda she ordered x-rays on her hands no one called her with the results. Possible arthritis maybe anti-inflammatory.    Patient in office for an acute visit. Patient complaining of pain in her right shoulder, both hands. Patient started on cymbalta  at previous visit, states it is not helping. Will send in Celebrex .  Patient also due for fasting lab work. Will do today.  Blood pressure ok today.  Continue all other medications.     No other concerns at this time.   Past Medical History:  Diagnosis Date   Abnormal drug screen (03/04/2023) 03/25/2023   (03/04/2023) UDS (+) for carboxy-THC (marijuana)      Abnormal MRI, cervical spine (05/10/2021) 03/25/2023   (05/10/2021) CERVICAL MRI FINDINGS:  Cord: Mild cord hyperintensity at C5, unchanged.     DISC LEVELS:  C2-3: Mild left foraminal narrowing due to facet hypertrophy  C3-4: Small central disc protrusion unchanged. Mild spinal stenosis. Bilateral facet degeneration.  C4-5: ACDF with anterior plate. Mild left foraminal narrowing.  C5-6: Solid interbody fusion without plate.  C6-7: Solid interbody fusi   Abnormal MRI, lumbar spine (12/11/2022) 03/04/2023   (12/11/2022) LUMBAR MRI FINDINGS:  Alignment:  Mild degenerative anterolisthesis at L3-4 and L4-5     DISC LEVELS:  T12- L1: Mild progression of chronic central protrusion.  L1-2: Small left paracentral protrusion with mild interval regression.  L2-3: Disc bulging eccentric to the left with inferior foraminal protrusion. Mild facet spurring. Mild left foraminal narrowing.  L3-4: Degenerative facet    Abnormal NCS (nerve conduction studies) 03/04/2023   Upper extremity EMG IMPRESSION: Abnormal study. 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      There is an electrodiagnostic and sonographic evidence of bilateral median neuropathies at the wrists (i.e carpal tunnel syndrome) and electrodiagnostic and s   Anxiety    Arthritis    Bursitis    Rt hip   Cervical facet joint pain 03/04/2023   Closed fracture of patella 05/06/2017   Complication of anesthesia    a.) PONV. 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   Family history of adverse reaction to anesthesia    a.) mother - severe PONV   Fibromyalgia    GERD (gastroesophageal reflux disease)    H/O arthritis 03/06/2015   H/O: hypothyroidism 03/06/2015   History of 2019 novel coronavirus disease (COVID-19) 12/20/2020   a.) preoperative PCR (+) on 12/20/2020; retested following day per pt/surgeon request; PCR once again (+) on 12/21/2020   History of carpal tunnel release of wrists (Bilateral) 03/04/2023   History of fusion of cervical spine (C4-C7) 03/04/2023   History of kidney stones    History of marijuana use 03/25/2023   (03/04/2023) UDS (+) for carboxy-THC (marijuana)      History of migraine headaches 05/02/2015   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   Hypercholesteremia    Hypertension    Hypothyroidism    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   Knee pain 05/06/2017   PONV (postoperative nausea and vomiting)    Pre-diabetes    Sepsis  due to urinary tract infection (HCC) 07/02/2016   Sleep apnea    MILD- DOESN'T NEED cpap   Status post total hip replacement, 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   Thyroid  disease    Weakness of limb 05/06/2017    Past Surgical History:  Procedure Laterality Date   ABDOMINAL HYSTERECTOMY     ANTERIOR INTEROSSEOUS NERVE DECOMPRESSION Left 01/05/2021   Procedure: Cubital tunnel release, left;  Surgeon: Kathlynn Sharper, MD;   Location: [facility] OR[facility];  Service: Orthopedics;  Laterality: Left;   CARPAL TUNNEL RELEASE Right 09/01/2020   Procedure: Right carpal tunnel release;  Surgeon: Kathlynn Sharper, MD;  Location: [facility];  Service: Orthopedics;  Laterality: Right;   CARPAL TUNNEL RELEASE Left 01/05/2021   Procedure: Carpal tunnel release, left;  Surgeon: Kathlynn Sharper, MD;  Location: [facility];  Service: Orthopedics;  Laterality: Left;   CATARACT EXTRACTION, BILATERAL     CHOLECYSTECTOMY     COLONOSCOPY WITH PROPOFOL  N/A 11/13/2016   Procedure: COLONOSCOPY WITH PROPOFOL ;  Surgeon: Gladis RAYMOND Mariner, MD;  Location: [facility] ENDOSCOPY;  Service: Endoscopy;  Laterality: N/A;   ESOPHAGOGASTRODUODENOSCOPY (EGD) WITH PROPOFOL  N/A 11/13/2016   Procedure: ESOPHAGOGASTRODUODENOSCOPY (EGD) WITH PROPOFOL ;  Surgeon: Gladis RAYMOND Mariner, MD;  Location: [facility] ENDOSCOPY;  Service: Endoscopy;  Laterality: N/A;   ESOPHAGOGASTRODUODENOSCOPY (EGD) WITH PROPOFOL  N/A 12/16/2018   Procedure: ESOPHAGOGASTRODUODENOSCOPY (EGD) WITH PROPOFOL ;  Surgeon: Mariner Gladis RAYMOND, MD;  Location: [facility] ENDOSCOPY;  Service: Endoscopy;  Laterality: N/A;   fundoflication  2015   at [facility]   LAPAROSCOPIC HYSTERECTOMY Bilateral 02/28/2016   Procedure: HYSTERECTOMY TOTAL LAPAROSCOPIC / BSO;  Surgeon: Mitzie BROCKS Ward, MD;  Location: [facility];  Service: Gynecology;  Laterality: Bilateral;   LEFT HEART CATH AND CORONARY ANGIOGRAPHY Left 09/29/2024   Procedure: LEFT HEART CATH AND CORONARY ANGIOGRAPHY with possible coronary intervention;  Surgeon: Fernand Denyse LABOR, MD;  Location: [facility] INVASIVE [facility];  Service: Cardiovascular;  Laterality: Left;   NECK SURGERY     X 2   NISSEN FUNDOPLICATION     TOTAL HIP ARTHROPLASTY Right 01/02/2022   Procedure: TOTAL HIP ARTHROPLASTY ANTERIOR APPROACH;  Surgeon: Kathlynn Sharper, MD;  Location: [facility];  Service: Orthopedics;  Laterality: Right;   TUBAL LIGATION     URETEROSCOPY WITH HOLMIUM LASER LITHOTRIPSY Left 07/03/2016    Procedure: URETEROSCOPY WITH HOLMIUM LASER LITHOTRIPSY;  Surgeon: Sharper JONELLE Burkes, MD;  Location: [facility];  Service: Urology;  Laterality: Left;    Social History   Socioeconomic History   Marital status: Widowed    Spouse name: Not on file   Number of children: 2   Years of education: Not on file   Highest education level: High school graduate  Occupational History    Comment: fulltime  Tobacco Use   Smoking status: Former    Current packs/day: 0.00    Types: Cigarettes    Start date: 11/14/1974    Quit date: 05/14/2014    Years since quitting: 10.4   Smokeless tobacco: Never  Vaping Use   Vaping status: Every Day   Substances: Nicotine  Substance and Sexual Activity   Alcohol  use: Not Currently    Alcohol /week: 0.0 - 1.0 standard drinks of alcohol    Drug use: No   Sexual activity: Yes    Partners: Male    Birth control/protection: Condom  Other Topics Concern   Not on file  Social History Narrative   Not on file   Social Drivers of Health   Tobacco Use: Medium Risk (10/23/2024)   Patient History  Smoking Tobacco Use: Former    Smokeless Tobacco Use: Never    Passive Exposure: Not on file  Financial Resource Strain: Low Risk (10/18/2023)   Overall Financial Resource Strain (CARDIA)    Difficulty of Paying Living Expenses: Not very hard  Food Insecurity: No Food Insecurity (04/24/2024)   Received from [facility]   Epic    Within the past 12 months, you worried that your food would run out before you got the money to buy more.: Never true    Within the past 12 months, the food you bought just didn't last and you didn't have money to get more.: Never true  Transportation Needs: No Transportation Needs (04/24/2024)   Received from [facility]   Epic    In the past 12 months, has lack of transportation kept you from medical appointments or from getting medications?: No    In the past 12 months, has lack of transportation kept you from meetings, work, or from  getting things needed for daily living?: No  Physical Activity: Inactive (10/18/2023)   Exercise Vital Sign    Days of Exercise per Week: 0 days    Minutes of Exercise per Session: 0 min  Stress: Stress Concern Present (10/18/2023)   [employer] of Occupational Health - Occupational Stress Questionnaire    Feeling of Stress : Very much  Social Connections: Socially Isolated (10/18/2023)   Social Connection and Isolation Panel    Frequency of Communication with Friends and Family: Never    Frequency of Social Gatherings with Friends and Family: More than three times a week    Attends Religious Services: Never    Database Administrator or Organizations: No    Attends Banker Meetings: Never    Marital Status: Widowed  Intimate Partner Violence: Not At Risk (10/18/2023)   Humiliation, Afraid, Rape, and Kick questionnaire    Fear of Current or Ex-Partner: No    Emotionally Abused: No    Physically Abused: No    Sexually Abused: No  Depression (PHQ2-9): High Risk (01/21/2024)   Depression (PHQ2-9)    PHQ-2 Score: 14  Alcohol  Screen: Low Risk (10/18/2023)   Alcohol  Screen    Last Alcohol  Screening Score (AUDIT): 0  Housing: Low Risk (10/18/2023)   Housing Stability Vital Sign    Unable to Pay for Housing in the Last Year: No    Number of Times Moved in the Last Year: 0    Homeless in the Last Year: No  Utilities: Low Risk (04/24/2024)   Received from [facility]   Utilities    Within the past 12 months, have you been unable to get utilities(heat, electricity) when it was really needed?: No  Health Literacy: Adequate Health Literacy (10/18/2023)   B1300 Health Literacy    Frequency of need for help with medical instructions: Never    Family History  Problem Relation Age of Onset   Depression Mother    Anxiety disorder Mother    Breast cancer Neg Hx     Allergies[1]  Show/hide medication list[2]  Review of Systems  Constitutional: Negative.   HENT:  Negative.    Eyes: Negative.   Respiratory: Negative.  Negative for shortness of breath.   Cardiovascular: Negative.  Negative for chest pain.  Gastrointestinal: Negative.  Negative for abdominal pain, constipation and diarrhea.  Genitourinary: Negative.   Musculoskeletal:  Positive for joint pain. Negative for myalgias.  Skin: Negative.   Neurological: Negative.  Negative for dizziness  and headaches.  Endo/Heme/Allergies: Negative.   All other systems reviewed and are negative.      Objective:   BP 134/70   Pulse 72   Ht 5' (1.524 m)   Wt 168 lb 12.8 oz (76.6 kg)   SpO2 97%   BMI 32.97 kg/m   Vitals:   10/23/24 1148  BP: 134/70  Pulse: 72  Height: 5' (1.524 m)  Weight: 168 lb 12.8 oz (76.6 kg)  SpO2: 97%  BMI (Calculated): 32.97    Physical Exam Vitals and nursing note reviewed.  Constitutional:      Appearance: Normal appearance. She is normal weight.  HENT:     Head: Normocephalic and atraumatic.     Nose: Nose normal.     Mouth/Throat:     Mouth: Mucous membranes are moist.  Eyes:     Extraocular Movements: Extraocular movements intact.     Conjunctiva/sclera: Conjunctivae normal.     Pupils: Pupils are equal, round, and reactive to light.  Cardiovascular:     Rate and Rhythm: Normal rate and regular rhythm.     Pulses: Normal pulses.     Heart sounds: Normal heart sounds.  Pulmonary:     Effort: Pulmonary effort is normal.     Breath sounds: Normal breath sounds.  Abdominal:     General: Abdomen is flat. Bowel sounds are normal.     Palpations: Abdomen is soft.  Musculoskeletal:        General: Normal range of motion.     Cervical back: Normal range of motion.  Skin:    General: Skin is warm and dry.  Neurological:     General: No focal deficit present.     Mental Status: She is alert and oriented to person, place, and time.  Psychiatric:        Mood and Affect: Mood normal.        Behavior: Behavior normal.        Thought Content: Thought  content normal.        Judgment: Judgment normal.      Results for orders placed or performed in visit on 10/23/24  CMP14+EGFR  Result Value Ref Range   Glucose 123 (H) 70 - 99 mg/dL   BUN 10 8 - 27 mg/dL   Creatinine, Ser 9.06 0.57 - 1.00 mg/dL   eGFR 68 >40 fO/fpw/8.26   BUN/Creatinine Ratio 11 (L) 12 - 28   Sodium 141 134 - 144 mmol/L   Potassium 4.3 3.5 - 5.2 mmol/L   Chloride 103 96 - 106 mmol/L   CO2 24 20 - 29 mmol/L   Calcium  8.9 8.7 - 10.3 mg/dL   Total Protein 6.4 6.0 - 8.5 g/dL   Albumin 4.2 3.9 - 4.9 g/dL   Globulin, Total 2.2 1.5 - 4.5 g/dL   Bilirubin Total 0.5 0.0 - 1.2 mg/dL   Alkaline Phosphatase 92 49 - 135 IU/L   AST 22 0 - 40 IU/L   ALT 13 0 - 32 IU/L  Lipid Profile  Result Value Ref Range   Cholesterol, Total 99 (L) 100 - 199 mg/dL   Triglycerides 862 0 - 149 mg/dL   HDL 41 >60 mg/dL   VLDL Cholesterol Cal 24 5 - 40 mg/dL   LDL Chol Calc (NIH) 34 0 - 99 mg/dL   Chol/HDL Ratio 2.4 0.0 - 4.4 ratio  TSH  Result Value Ref Range   TSH 0.062 (L) 0.450 - 4.500 uIU/mL  Hemoglobin A1c  Result Value Ref  Range   Hgb A1c MFr Bld 6.3 (H) 4.8 - 5.6 %   Est. average glucose Bld gHb Est-mCnc 134 mg/dL    Recent Results (from the past 2160 hours)  Basic metabolic panel     Status: Abnormal   Collection Time: 09/13/24  1:52 PM  Result Value Ref Range   Sodium 138 135 - 145 mmol/L   Potassium 3.5 3.5 - 5.1 mmol/L   Chloride 104 98 - 111 mmol/L   CO2 25 22 - 32 mmol/L   Glucose, Bld 159 (H) 70 - 99 mg/dL    Comment: Glucose reference range applies only to samples taken after fasting for at least 8 hours.   BUN 10 8 - 23 mg/dL   Creatinine, Ser 9.28 0.44 - 1.00 mg/dL   Calcium  8.9 8.9 - 10.3 mg/dL   GFR, Estimated >39 >39 mL/min    Comment: (NOTE) Calculated using the CKD-EPI Creatinine Equation (2021)    Anion gap 9 5 - 15    Comment: Performed at [facility], [address] [place], [place] [address]  CBC     Status: None   Collection Time:  09/13/24  1:52 PM  Result Value Ref Range   WBC 6.8 4.0 - 10.5 K/uL   RBC 4.63 3.87 - 5.11 MIL/uL   Hemoglobin 13.3 12.0 - 15.0 g/dL   HCT 60.0 63.9 - 53.9 %   MCV 86.2 80.0 - 100.0 fL   MCH 28.7 26.0 - 34.0 pg   MCHC 33.3 30.0 - 36.0 g/dL   RDW 87.2 88.4 - 84.4 %   Platelets 246 150 - 400 K/uL   nRBC 0.0 0.0 - 0.2 %    Comment: Performed at [facility], [address], [place], [place] [address]  Troponin I (High Sensitivity)     Status: None   Collection Time: 09/13/24  1:52 PM  Result Value Ref Range   Troponin I (High Sensitivity) 3 <18 ng/L    Comment: (NOTE) Elevated high sensitivity troponin I (hsTnI) values and significant  changes across serial measurements may suggest ACS but many other  chronic and acute conditions are known to elevate hsTnI results.  Refer to the Links section for chest pain algorithms and additional  guidance. Performed at [facility], [address] [place], [place] [address]   Protime-INR (order if Patient is taking Coumadin / Warfarin)     Status: None   Collection Time: 09/13/24  1:52 PM  Result Value Ref Range   Prothrombin Time 15.2 11.4 - 15.2 seconds   INR 1.1 0.8 - 1.2    Comment: (NOTE) INR goal varies based on device and disease states. Performed at [facility], [address] [place], [place] [address]   Troponin I (High Sensitivity)     Status: None   Collection Time: 09/13/24  3:47 PM  Result Value Ref Range   Troponin I (High Sensitivity) 3 <18 ng/L    Comment: (NOTE) Elevated high sensitivity troponin I (hsTnI) values and significant  changes across serial measurements may suggest ACS but many other  chronic and acute conditions are known to elevate hsTnI results.  Refer to the Links section for chest pain algorithms and additional  guidance. Performed at [facility], [address] [place], [place] [address]   Basic metabolic panel with GFR     Status: Abnormal    Collection Time: 09/25/24 10:03 AM  Result Value Ref Range   Glucose 97 70 - 99  mg/dL   BUN 7 (L) 8 - 27 mg/dL   Creatinine, Ser 9.31 0.57 - 1.00 mg/dL   eGFR 96 >40 fO/fpw/8.26   BUN/Creatinine Ratio 10 (L) 12 - 28   Sodium 141 134 - 144 mmol/L   Potassium 4.7 3.5 - 5.2 mmol/L   Chloride 102 96 - 106 mmol/L   CO2 23 20 - 29 mmol/L   Calcium  9.3 8.7 - 10.3 mg/dL  CBC with Differential/Platelet     Status: None   Collection Time: 09/25/24 10:03 AM  Result Value Ref Range   WBC 5.2 3.4 - 10.8 x10E3/uL   RBC 4.13 3.77 - 5.28 x10E6/uL   Hemoglobin 12.7 11.1 - 15.9 g/dL   Hematocrit 61.0 65.9 - 46.6 %   MCV 94 79 - 97 fL   MCH 30.8 26.6 - 33.0 pg   MCHC 32.6 31.5 - 35.7 g/dL   RDW 85.7 88.2 - 84.5 %   Platelets 288 150 - 450 x10E3/uL   Neutrophils 61 Not Estab. %   Lymphs 24 Not Estab. %   Monocytes 10 Not Estab. %   Eos 4 Not Estab. %   Basos 1 Not Estab. %   Neutrophils Absolute 3.2 1.4 - 7.0 x10E3/uL   Lymphocytes Absolute 1.2 0.7 - 3.1 x10E3/uL   Monocytes Absolute 0.5 0.1 - 0.9 x10E3/uL   EOS (ABSOLUTE) 0.2 0.0 - 0.4 x10E3/uL   Basophils Absolute 0.0 0.0 - 0.2 x10E3/uL   Immature Granulocytes 0 Not Estab. %   Immature Grans (Abs) 0.0 0.0 - 0.1 x10E3/uL  CMP14+EGFR     Status: Abnormal   Collection Time: 10/23/24 12:08 PM  Result Value Ref Range   Glucose 123 (H) 70 - 99 mg/dL   BUN 10 8 - 27 mg/dL   Creatinine, Ser 9.06 0.57 - 1.00 mg/dL   eGFR 68 >40 fO/fpw/8.26   BUN/Creatinine Ratio 11 (L) 12 - 28   Sodium 141 134 - 144 mmol/L   Potassium 4.3 3.5 - 5.2 mmol/L   Chloride 103 96 - 106 mmol/L   CO2 24 20 - 29 mmol/L   Calcium  8.9 8.7 - 10.3 mg/dL   Total Protein 6.4 6.0 - 8.5 g/dL   Albumin 4.2 3.9 - 4.9 g/dL   Globulin, Total 2.2 1.5 - 4.5 g/dL   Bilirubin Total 0.5 0.0 - 1.2 mg/dL   Alkaline Phosphatase 92 49 - 135 IU/L   AST 22 0 - 40 IU/L   ALT 13 0 - 32 IU/L  Lipid Profile     Status: Abnormal   Collection Time: 10/23/24 12:08 PM  Result Value Ref Range    Cholesterol, Total 99 (L) 100 - 199 mg/dL   Triglycerides 862 0 - 149 mg/dL   HDL 41 >60 mg/dL   VLDL Cholesterol Cal 24 5 - 40 mg/dL   LDL Chol Calc (NIH) 34 0 - 99 mg/dL   Chol/HDL Ratio 2.4 0.0 - 4.4 ratio    Comment:                                   T. Chol/HDL Ratio                                             Men  Women  1/2 Avg.Risk  3.4    3.3                                   Avg.Risk  5.0    4.4                                2X Avg.Risk  9.6    7.1                                3X Avg.Risk 23.4   11.0   TSH     Status: Abnormal   Collection Time: 10/23/24 12:08 PM  Result Value Ref Range   TSH 0.062 (L) 0.450 - 4.500 uIU/mL  Hemoglobin A1c     Status: Abnormal   Collection Time: 10/23/24 12:08 PM  Result Value Ref Range   Hgb A1c MFr Bld 6.3 (H) 4.8 - 5.6 %    Comment:          Prediabetes: 5.7 - 6.4          Diabetes: >6.4          Glycemic control for adults with diabetes: <7.0    Est. average glucose Bld gHb Est-mCnc 134 mg/dL      Assessment & Plan:  Fasting lab work today Celebrex  Continue current medications  Problem List Items Addressed This Visit       Cardiovascular and Mediastinum   Benign essential hypertension - Primary   Relevant Orders   CMP14+EGFR (Completed)     Endocrine   Hypothyroidism   Relevant Orders   TSH (Completed)   Other Visit Diagnoses       Mixed hyperlipidemia       Relevant Orders   Lipid Profile (Completed)     Diabetes mellitus screening       Relevant Orders   Hemoglobin A1c (Completed)       Return in about 4 weeks (around 11/20/2024) for with amanda.   Total time spent: 25 minutes. This time includes review of previous notes and results and patient face to face interaction during today's visit.    Jeoffrey Pollen, NP  10/23/2024   This document may have been prepared by [facility] Voice Recognition software and as such may include unintentional dictation errors.      [1]   Allergies Allergen Reactions   Codeine Nausea Only  [2]  Outpatient Medications Prior to Visit  Medication Sig   acetaminophen  (TYLENOL ) 650 MG CR tablet Take 1,300 mg by mouth every 8 (eight) hours as needed for pain.   apixaban  (ELIQUIS ) 5 MG TABS tablet Take 1 tablet (5 mg total) by mouth 2 (two) times daily.   aspirin  EC 81 MG tablet Take 1 tablet (81 mg total) by mouth daily. Swallow whole.   chlorpheniramine (CHLOR-TRIMETON) 4 MG tablet Take 4 mg by mouth 2 (two) times daily as needed for allergies.   DULoxetine  (CYMBALTA ) 30 MG capsule Take 1 capsule (30 mg total) by mouth daily.   gabapentin  (NEURONTIN ) 300 MG capsule Take 300 mg by mouth 3 (three) times daily as needed (pain).   levothyroxine  (SYNTHROID ) 100 MCG tablet Take 1 tablet (100 mcg total) by mouth daily.   lisinopril -hydrochlorothiazide  (ZESTORETIC ) 10-12.5 MG tablet Take 1 tablet by mouth daily.   Melatonin 10 MG CAPS Take  10 mg by mouth at bedtime.   Naloxone HCl (NARCAN NA) Place 1 spray into the nose daily as needed (overdose).   ondansetron  (ZOFRAN ) 8 MG tablet Take 8 mg by mouth as needed.   oxyCODONE -acetaminophen  (PERCOCET/ROXICET) 5-325 MG tablet Take 1 tablet by mouth 3 (three) times daily as needed.   propranolol  (INDERAL ) 20 MG tablet Take 1 tablet (20 mg total) by mouth 2 (two) times daily.   rosuvastatin  (CRESTOR ) 20 MG tablet TAKE 1 TABLET BY MOUTH EVERY DAY   lipase/protease/amylase (CREON ) 36000 UNITS CPEP capsule Take 1 capsule with any snacks, and take 2 capsules with meals. (Patient not taking: Reported on 10/23/2024)   No facility-administered medications prior to visit.

## 2024-10-24 LAB — HEMOGLOBIN A1C
Est. average glucose Bld gHb Est-mCnc: 134 mg/dL
Hgb A1c MFr Bld: 6.3 % — ABNORMAL HIGH (ref 4.8–5.6)

## 2024-10-24 LAB — CMP14+EGFR
ALT: 13 IU/L (ref 0–32)
AST: 22 IU/L (ref 0–40)
Albumin: 4.2 g/dL (ref 3.9–4.9)
Alkaline Phosphatase: 92 IU/L (ref 49–135)
BUN/Creatinine Ratio: 11 — ABNORMAL LOW (ref 12–28)
BUN: 10 mg/dL (ref 8–27)
Bilirubin Total: 0.5 mg/dL (ref 0.0–1.2)
CO2: 24 mmol/L (ref 20–29)
Calcium: 8.9 mg/dL (ref 8.7–10.3)
Chloride: 103 mmol/L (ref 96–106)
Creatinine, Ser: 0.93 mg/dL (ref 0.57–1.00)
Globulin, Total: 2.2 g/dL (ref 1.5–4.5)
Glucose: 123 mg/dL — ABNORMAL HIGH (ref 70–99)
Potassium: 4.3 mmol/L (ref 3.5–5.2)
Sodium: 141 mmol/L (ref 134–144)
Total Protein: 6.4 g/dL (ref 6.0–8.5)
eGFR: 68 mL/min/1.73 (ref >59)

## 2024-10-24 LAB — LIPID PANEL
Chol/HDL Ratio: 2.4 ratio (ref 0.0–4.4)
Cholesterol, Total: 99 mg/dL — ABNORMAL LOW (ref 100–199)
HDL: 41 mg/dL (ref >39)
LDL Chol Calc (NIH): 34 mg/dL (ref 0–99)
Triglycerides: 137 mg/dL (ref 0–149)
VLDL Cholesterol Cal: 24 mg/dL (ref 5–40)

## 2024-10-24 LAB — TSH: TSH: 0.062 u[IU]/mL — ABNORMAL LOW (ref 0.450–4.500)

## 2024-10-26 ENCOUNTER — Ambulatory Visit: Payer: Self-pay | Admitting: Cardiology

## 2024-11-11 ENCOUNTER — Other Ambulatory Visit: Payer: Self-pay | Admitting: Family

## 2024-11-11 ENCOUNTER — Encounter: Payer: Self-pay | Admitting: Family

## 2024-11-18 ENCOUNTER — Encounter: Payer: Self-pay | Admitting: Podiatry

## 2024-11-18 ENCOUNTER — Ambulatory Visit: Admitting: Podiatry

## 2024-11-18 DIAGNOSIS — L603 Nail dystrophy: Secondary | ICD-10-CM | POA: Diagnosis not present

## 2024-11-18 NOTE — Progress Notes (Signed)
 "  Subjective:  Patient ID: Evelyn Bentley, female    DOB: Mar 21, 1956,  MRN: [id number] HPI Chief Complaint  Patient presents with   Nail Problem    NP- ingrown R 1st toe, 3 toenails growing on bilateral 1st toes on top of each other, very thick Diabetic    69 y.o. female presents with the above complaint.   ROS: Denies fever chills nausea mobic muscle aches pains calf pain back pain chest pain shortness of breath.  Past Medical History:  Diagnosis Date   Abnormal drug screen (03/04/2023) 03/25/2023   (03/04/2023) UDS (+) for carboxy-THC (marijuana)      Abnormal MRI, cervical spine (05/10/2021) 03/25/2023   (05/10/2021) CERVICAL MRI FINDINGS:  Cord: Mild cord hyperintensity at C5, unchanged.     DISC LEVELS:  C2-3: Mild left foraminal narrowing due to facet hypertrophy  C3-4: Small central disc protrusion unchanged. Mild spinal stenosis. Bilateral facet degeneration.  C4-5: ACDF with anterior plate. Mild left foraminal narrowing.  C5-6: Solid interbody fusion without plate.  C6-7: Solid interbody fusi   Abnormal MRI, lumbar spine (12/11/2022) 03/04/2023   (12/11/2022) LUMBAR MRI FINDINGS:  Alignment:  Mild degenerative anterolisthesis at L3-4 and L4-5     DISC LEVELS:  T12- L1: Mild progression of chronic central protrusion.  L1-2: Small left paracentral protrusion with mild interval regression.  L2-3: Disc bulging eccentric to the left with inferior foraminal protrusion. Mild facet spurring. Mild left foraminal narrowing.  L3-4: Degenerative facet    Abnormal NCS (nerve conduction studies) 03/04/2023   Upper extremity EMG IMPRESSION: Abnormal study. 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      There is an electrodiagnostic and sonographic evidence of bilateral median neuropathies at the wrists (i.e carpal tunnel syndrome) and electrodiagnostic and s    Anxiety    Arthritis    Bursitis    Rt hip   Cervical facet joint pain 03/04/2023   Closed fracture of patella 05/06/2017   Complication of anesthesia    a.) PONV. 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   Family history of adverse reaction to anesthesia    a.) mother - severe PONV   Fibromyalgia    GERD (gastroesophageal reflux disease)    H/O arthritis 03/06/2015   H/O: hypothyroidism 03/06/2015   History of 2019 novel coronavirus disease (COVID-19) 12/20/2020   a.) preoperative PCR (+) on 12/20/2020; retested following day per pt/surgeon request; PCR once again (+) on 12/21/2020   History of carpal tunnel release of wrists (Bilateral) 03/04/2023   History of fusion of cervical spine (C4-C7) 03/04/2023   History of kidney stones    History of marijuana use 03/25/2023   (03/04/2023) UDS (+) for carboxy-THC (marijuana)      History of migraine headaches 05/02/2015   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   Hypercholesteremia    Hypertension    Hypothyroidism    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   Knee pain 05/06/2017   PONV (postoperative nausea and vomiting)    Pre-diabetes    Sepsis due to urinary tract infection (HCC) 07/02/2016   Sleep apnea    MILD- DOESN'T NEED cpap   Status post total hip replacement, 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   Thyroid  disease    Weakness of limb 05/06/2017   Past Surgical History:  Procedure Laterality Date   ABDOMINAL HYSTERECTOMY  ANTERIOR INTEROSSEOUS NERVE DECOMPRESSION Left 01/05/2021   Procedure: Cubital tunnel release, left;  Surgeon: Kathlynn Sharper, MD;  Location: [facility];  Service: Orthopedics;  Laterality: Left;   CARPAL TUNNEL RELEASE Right 09/01/2020   Procedure: Right carpal tunnel release;  Surgeon: Kathlynn Sharper, MD;  Location: [facility];  Service: Orthopedics;  Laterality: Right;   CARPAL TUNNEL RELEASE Left 01/05/2021   Procedure: Carpal tunnel  release, left;  Surgeon: Kathlynn Sharper, MD;  Location: [facility];  Service: Orthopedics;  Laterality: Left;   CATARACT EXTRACTION, BILATERAL     CHOLECYSTECTOMY     COLONOSCOPY WITH PROPOFOL  N/A 11/13/2016   Procedure: COLONOSCOPY WITH PROPOFOL ;  Surgeon: Gladis RAYMOND Mariner, MD;  Location: [facility];  Service: Endoscopy;  Laterality: N/A;   ESOPHAGOGASTRODUODENOSCOPY (EGD) WITH PROPOFOL  N/A 11/13/2016   Procedure: ESOPHAGOGASTRODUODENOSCOPY (EGD) WITH PROPOFOL ;  Surgeon: Gladis RAYMOND Mariner, MD;  Location: [facility];  Service: Endoscopy;  Laterality: N/A;   ESOPHAGOGASTRODUODENOSCOPY (EGD) WITH PROPOFOL  N/A 12/16/2018   Procedure: ESOPHAGOGASTRODUODENOSCOPY (EGD) WITH PROPOFOL ;  Surgeon: Mariner Gladis RAYMOND, MD;  Location: [facility] ENDOSCOPY;  Service: Endoscopy;  Laterality: N/A;   fundoflication  2015   at [facility] hospital   LAPAROSCOPIC HYSTERECTOMY Bilateral 02/28/2016   Procedure: HYSTERECTOMY TOTAL LAPAROSCOPIC / BSO;  Surgeon: Mitzie BROCKS Ward, MD;  Location: [facility];  Service: Gynecology;  Laterality: Bilateral;   LEFT HEART CATH AND CORONARY ANGIOGRAPHY Left 09/29/2024   Procedure: LEFT HEART CATH AND CORONARY ANGIOGRAPHY with possible coronary intervention;  Surgeon: Fernand Denyse LABOR, MD;  Location: [facility];  Service: Cardiovascular;  Laterality: Left;   NECK SURGERY     X 2   NISSEN FUNDOPLICATION     TOTAL HIP ARTHROPLASTY Right 01/02/2022   Procedure: TOTAL HIP ARTHROPLASTY ANTERIOR APPROACH;  Surgeon: Kathlynn Sharper, MD;  Location: [facility];  Service: Orthopedics;  Laterality: Right;   TUBAL LIGATION     URETEROSCOPY WITH HOLMIUM LASER LITHOTRIPSY Left 07/03/2016   Procedure: URETEROSCOPY WITH HOLMIUM LASER LITHOTRIPSY;  Surgeon: Sharper JONELLE Burkes, MD;  Location: [facility];  Service: Urology;  Laterality: Left;   Current Medications[1]  Allergies[2] Review of Systems Objective:  There were no vitals filed for this visit.  General: Well developed, nourished, in no  acute distress, alert and oriented x3   Dermatological: Skin is warm, dry and supple bilateral. Nails x 10 are well maintained; however hallux nail plate bilateral does demonstrate a nail dystrophy with subungual debris possibly consistent with onychomycosis.  Remaining integument appears unremarkable at this time. There are no open sores, no preulcerative lesions, no rash or signs of infection present.  Vascular: Dorsalis Pedis artery and Posterior Tibial artery pedal pulses are 2/4 bilateral with immedate capillary fill time. Pedal hair growth present. No varicosities and no lower extremity edema present bilateral.   Neruologic: Grossly intact via light touch bilateral. Vibratory intact via tuning fork bilateral. Protective threshold with Semmes Wienstein monofilament intact to all pedal sites bilateral. Patellar and Achilles deep tendon reflexes 2+ bilateral. No Babinski or clonus noted bilateral.   Musculoskeletal: No gross boney pedal deformities bilateral. No pain, crepitus, or limitation noted with foot and ankle range of motion bilateral. Muscular strength 5/5 in all groups tested bilateral.  Gait: Unassisted, Nonantalgic.    Radiographs:  None taken  Assessment & Plan:   Assessment: Nail dystrophy hallux bilateral  Plan: Samples of the skin and nail were taken today for pathologic evaluation.     Kamarah Bilotta T. Zaylie Gisler, DPM    [1]  Current Outpatient  Medications:    MIEBO 1.338 GM/ML SOLN, Apply 1 drop to eye 4 (four) times daily., Disp: , Rfl:    acetaminophen  (TYLENOL ) 650 MG CR tablet, Take 1,300 mg by mouth every 8 (eight) hours as needed for pain., Disp: , Rfl:    apixaban  (ELIQUIS ) 5 MG TABS tablet, Take 1 tablet (5 mg total) by mouth 2 (two) times daily., Disp: 60 tablet, Rfl: 2   aspirin  EC 81 MG tablet, Take 1 tablet (81 mg total) by mouth daily. Swallow whole., Disp: 30 tablet, Rfl: 12   celecoxib  (CELEBREX ) 200 MG capsule, Take 1 capsule (200 mg total) by mouth daily.,  Disp: 30 capsule, Rfl: 2   chlorpheniramine (CHLOR-TRIMETON) 4 MG tablet, Take 4 mg by mouth 2 (two) times daily as needed for allergies., Disp: , Rfl:    DULoxetine  (CYMBALTA ) 30 MG capsule, TAKE 1 CAPSULE BY MOUTH EVERY DAY, Disp: 90 capsule, Rfl: 3   gabapentin  (NEURONTIN ) 300 MG capsule, Take 300 mg by mouth 3 (three) times daily as needed (pain)., Disp: , Rfl:    levothyroxine  (SYNTHROID ) 100 MCG tablet, Take 1 tablet (100 mcg total) by mouth daily., Disp: 30 tablet, Rfl: 11   lipase/protease/amylase (CREON ) 36000 UNITS CPEP capsule, Take 1 capsule with any snacks, and take 2 capsules with meals. (Patient not taking: Reported on 10/23/2024), Disp: 360 capsule, Rfl: 1   lisinopril -hydrochlorothiazide  (ZESTORETIC ) 10-12.5 MG tablet, Take 1 tablet by mouth daily., Disp: 30 tablet, Rfl: 2   Melatonin 10 MG CAPS, Take 10 mg by mouth at bedtime., Disp: , Rfl:    Naloxone HCl (NARCAN NA), Place 1 spray into the nose daily as needed (overdose)., Disp: , Rfl:    ondansetron  (ZOFRAN ) 8 MG tablet, Take 8 mg by mouth as needed., Disp: , Rfl:    oxyCODONE -acetaminophen  (PERCOCET/ROXICET) 5-325 MG tablet, Take 1 tablet by mouth 3 (three) times daily as needed., Disp: , Rfl:    propranolol  (INDERAL ) 20 MG tablet, Take 1 tablet (20 mg total) by mouth 2 (two) times daily., Disp: 60 tablet, Rfl: 11   rosuvastatin  (CRESTOR ) 20 MG tablet, TAKE 1 TABLET BY MOUTH EVERY DAY, Disp: 90 tablet, Rfl: 1 [2]  Allergies Allergen Reactions   Codeine Nausea Only   "

## 2024-11-20 ENCOUNTER — Encounter: Payer: Self-pay | Admitting: Family

## 2024-11-20 ENCOUNTER — Ambulatory Visit: Admitting: Family

## 2024-11-20 VITALS — BP 115/66 | Wt 164.6 lb

## 2024-11-20 DIAGNOSIS — E039 Hypothyroidism, unspecified: Secondary | ICD-10-CM

## 2024-11-20 DIAGNOSIS — R300 Dysuria: Secondary | ICD-10-CM

## 2024-11-20 DIAGNOSIS — E1142 Type 2 diabetes mellitus with diabetic polyneuropathy: Secondary | ICD-10-CM

## 2024-11-20 DIAGNOSIS — R5383 Other fatigue: Secondary | ICD-10-CM | POA: Diagnosis not present

## 2024-11-20 DIAGNOSIS — I5032 Chronic diastolic (congestive) heart failure: Secondary | ICD-10-CM | POA: Diagnosis not present

## 2024-11-20 DIAGNOSIS — G9589 Other specified diseases of spinal cord: Secondary | ICD-10-CM

## 2024-11-20 DIAGNOSIS — I4891 Unspecified atrial fibrillation: Secondary | ICD-10-CM | POA: Insufficient documentation

## 2024-11-20 DIAGNOSIS — Z0130 Encounter for examination of blood pressure without abnormal findings: Secondary | ICD-10-CM

## 2024-11-20 DIAGNOSIS — G118 Other hereditary ataxias: Secondary | ICD-10-CM | POA: Diagnosis not present

## 2024-11-20 DIAGNOSIS — F331 Major depressive disorder, recurrent, moderate: Secondary | ICD-10-CM

## 2024-11-21 LAB — TSH+T4F+T3FREE
Free T4: 0.58 ng/dL — ABNORMAL LOW (ref 0.82–1.77)
T3, Free: 1.5 pg/mL — ABNORMAL LOW (ref 2.0–4.4)
TSH: 7.25 u[IU]/mL — ABNORMAL HIGH (ref 0.450–4.500)

## 2024-11-21 LAB — IRON,TIBC AND FERRITIN PANEL
Ferritin: 33 ng/mL (ref 15–150)
Iron Saturation: 40 % (ref 15–55)
Iron: 135 ug/dL (ref 27–139)
Total Iron Binding Capacity: 341 ug/dL (ref 250–450)
UIBC: 206 ug/dL (ref 118–369)

## 2024-11-22 ENCOUNTER — Encounter: Payer: Self-pay | Admitting: Family

## 2024-11-22 LAB — POCT URINALYSIS DIPSTICK
Bilirubin, UA: NEGATIVE
Glucose, UA: NEGATIVE
Ketones, UA: NEGATIVE
Nitrite, UA: NEGATIVE
Protein, UA: POSITIVE — AB
Spec Grav, UA: 1.02
Urobilinogen, UA: 0.2 U/dL
pH, UA: 6

## 2024-11-22 LAB — POC CREATINE & ALBUMIN,URINE
Albumin/Creatinine Ratio, Urine, POC: <30
Creatinine, POC: 300 mg/dL
Microalbumin Ur, POC: 80 mg/L

## 2024-11-22 NOTE — Assessment & Plan Note (Signed)
 Patient is seen by neurology, who manage this condition.  She is well controlled with current therapy.   Will defer to them for further changes to plan of care.

## 2024-11-22 NOTE — Progress Notes (Signed)
 "  Established Patient Office Visit  Subjective:  Patient ID: Evelyn Bentley, female    DOB: 1956/07/19  Age: 69 y.o. MRN: [id number]  Chief Complaint  Patient presents with   Follow-up    4 week fu echo    Patient is here today for her 1 month follow up.  She has been feeling poorly since last appointment.   She does have additional concerns to discuss today.  She continues to have severe fatigue and says that due to her TSH levels at her previous lab draw, she stopped her meds.   Labs are not due today, but she does need TSH recheckd.  She needs refills.   I have reviewed her active problem list, medication list, allergies, notes from last encounter, lab results for her appointment today.      No other concerns at this time.   Past Medical History:  Diagnosis Date   Abnormal drug screen (03/04/2023) 03/25/2023   (03/04/2023) UDS (+) for carboxy-THC (marijuana)      Abnormal MRI, cervical spine (05/10/2021) 03/25/2023   (05/10/2021) CERVICAL MRI FINDINGS:  Cord: Mild cord hyperintensity at C5, unchanged.     DISC LEVELS:  C2-3: Mild left foraminal narrowing due to facet hypertrophy  C3-4: Small central disc protrusion unchanged. Mild spinal stenosis. Bilateral facet degeneration.  C4-5: ACDF with anterior plate. Mild left foraminal narrowing.  C5-6: Solid interbody fusion without plate.  C6-7: Solid interbody fusi   Abnormal MRI, lumbar spine (12/11/2022) 03/04/2023   (12/11/2022) LUMBAR MRI FINDINGS:  Alignment:  Mild degenerative anterolisthesis at L3-4 and L4-5     DISC LEVELS:  T12- L1: Mild progression of chronic central protrusion.  L1-2: Small left paracentral protrusion with mild interval regression.  L2-3: Disc bulging eccentric to the left with inferior foraminal protrusion. Mild facet spurring. Mild left foraminal narrowing.  L3-4: Degenerative facet    Abnormal NCS (nerve conduction studies) 03/04/2023   Upper extremity EMG IMPRESSION: Abnormal study. There is  electrodiagnostic evidence of chronic, severe bilateral carpal tunnel, moderate to severe bilateral ulnar neuropathies, and a mild to moderate generalized sensory polyneuropathy.      There is an electrodiagnostic and sonographic evidence of bilateral median neuropathies at the wrists (i.e carpal tunnel syndrome) and electrodiagnostic and s   Anxiety    Arthritis    Bursitis    Rt hip   Cervical facet joint pain 03/04/2023   Closed fracture of patella 05/06/2017   Complication of anesthesia    a.) PONV. 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   Family history of adverse reaction to anesthesia    a.) mother - severe PONV   Fibromyalgia    GERD (gastroesophageal reflux disease)    H/O arthritis 03/06/2015   H/O: hypothyroidism 03/06/2015   History of 2019 novel coronavirus disease (COVID-19) 12/20/2020   a.) preoperative PCR (+) on 12/20/2020; retested following day per pt/surgeon request; PCR once again (+) on 12/21/2020   History of carpal tunnel release of wrists (Bilateral) 03/04/2023   History of fusion of cervical spine (C4-C7) 03/04/2023   History of kidney stones    History of marijuana use 03/25/2023   (03/04/2023) UDS (+) for carboxy-THC (marijuana)      History of migraine headaches 05/02/2015   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   Hypercholesteremia    Hypertension    Hypothyroidism    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   Knee pain 05/06/2017  PONV (postoperative nausea and vomiting)    Pre-diabetes    Sepsis due to urinary tract infection (HCC) 07/02/2016   Sleep apnea    MILD- DOESN'T NEED cpap   Status post total hip replacement, 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   Thyroid  disease    Weakness of limb 05/06/2017    Past Surgical History:  Procedure Laterality Date   ABDOMINAL HYSTERECTOMY     ANTERIOR INTEROSSEOUS NERVE DECOMPRESSION Left 01/05/2021   Procedure: Cubital tunnel release, left;   Surgeon: Kathlynn Sharper, MD;  Location: [facility] ORS;  Service: Orthopedics;  Laterality: Left;   CARPAL TUNNEL RELEASE Right 09/01/2020   Procedure: Right carpal tunnel release;  Surgeon: Kathlynn Sharper, MD;  Location: [facility];  Service: Orthopedics;  Laterality: Right;   CARPAL TUNNEL RELEASE Left 01/05/2021   Procedure: Carpal tunnel release, left;  Surgeon: Kathlynn Sharper, MD;  Location: [facility];  Service: Orthopedics;  Laterality: Left;   CATARACT EXTRACTION, BILATERAL     CHOLECYSTECTOMY     COLONOSCOPY WITH PROPOFOL  N/A 11/13/2016   Procedure: COLONOSCOPY WITH PROPOFOL ;  Surgeon: Gladis RAYMOND Mariner, MD;  Location: [facility] ENDOSCOPY;  Service: Endoscopy;  Laterality: N/A;   ESOPHAGOGASTRODUODENOSCOPY (EGD) WITH PROPOFOL  N/A 11/13/2016   Procedure: ESOPHAGOGASTRODUODENOSCOPY (EGD) WITH PROPOFOL ;  Surgeon: Gladis RAYMOND Mariner, MD;  Location: [facility] ENDOSCOPY;  Service: Endoscopy;  Laterality: N/A;   ESOPHAGOGASTRODUODENOSCOPY (EGD) WITH PROPOFOL  N/A 12/16/2018   Procedure: ESOPHAGOGASTRODUODENOSCOPY (EGD) WITH PROPOFOL ;  Surgeon: Mariner Gladis RAYMOND, MD;  Location: [facility] ENDOSCOPY;  Service: Endoscopy;  Laterality: N/A;   fundoflication  2015   at [facility]   LAPAROSCOPIC HYSTERECTOMY Bilateral 02/28/2016   Procedure: HYSTERECTOMY TOTAL LAPAROSCOPIC / BSO;  Surgeon: Mitzie BROCKS Ward, MD;  Location: [facility];  Service: Gynecology;  Laterality: Bilateral;   LEFT HEART CATH AND CORONARY ANGIOGRAPHY Left 09/29/2024   Procedure: LEFT HEART CATH AND CORONARY ANGIOGRAPHY with possible coronary intervention;  Surgeon: Fernand Denyse LABOR, MD;  Location: [facility] INVASIVE CV LAB;  Service: Cardiovascular;  Laterality: Left;   NECK SURGERY     X 2   NISSEN FUNDOPLICATION     TOTAL HIP ARTHROPLASTY Right 01/02/2022   Procedure: TOTAL HIP ARTHROPLASTY ANTERIOR APPROACH;  Surgeon: Kathlynn Sharper, MD;  Location: [facility] OR[facility];  Service: Orthopedics;  Laterality: Right;   TUBAL LIGATION     URETEROSCOPY WITH HOLMIUM LASER  LITHOTRIPSY Left 07/03/2016   Procedure: URETEROSCOPY WITH HOLMIUM LASER LITHOTRIPSY;  Surgeon: Sharper JONELLE Burkes, MD;  Location: [facility] ORS;  Service: Urology;  Laterality: Left;    Social History   Socioeconomic History   Marital status: Widowed    Spouse name: Not on file   Number of children: 2   Years of education: Not on file   Highest education level: High school graduate  Occupational History    Comment: fulltime  Tobacco Use   Smoking status: Former    Current packs/day: 0.00    Types: Cigarettes    Start date: 11/14/1974    Quit date: 05/14/2014    Years since quitting: 10.5   Smokeless tobacco: Never  Vaping Use   Vaping status: Every Day   Substances: Nicotine  Substance and Sexual Activity   Alcohol  use: Not Currently    Alcohol /week: 0.0 - 1.0 standard drinks of alcohol    Drug use: No   Sexual activity: Yes    Partners: Male    Birth control/protection: Condom  Other Topics Concern   Not on file  Social History Narrative   Not on file   Social Drivers of  Health   Tobacco Use: Medium Risk (11/22/2024)   Patient History    Smoking Tobacco Use: Former    Smokeless Tobacco Use: Never    Passive Exposure: Not on file  Financial Resource Strain: Low Risk  (11/13/2024)   Received from [facility]   Overall Financial Resource Strain (CARDIA)    Difficulty of Paying Living Expenses: Not very hard  Food Insecurity: No Food Insecurity (11/13/2024)   [facility]   Epic    Within the past 12 months, you worried that your food would run out before you got the money to buy more.: Never true    Within the past 12 months, the food you bought just didn't last and you didn't have money to get more.: Never true  Transportation Needs: No Transportation Needs (11/13/2024)   Received from [facility] - Transportation    In the past 12 months, has lack of transportation kept you from medical appointments or  from getting medications?: No    Lack of Transportation (Non-Medical): No  Physical Activity: Inactive (10/18/2023)   Exercise Vital Sign    Days of Exercise per Week: 0 days    Minutes of Exercise per Session: 0 min  Stress: Stress Concern Present (10/18/2023)   [employer] - Occupational Stress Questionnaire    Feeling of Stress : Very much  Social Connections: Socially Isolated (10/18/2023)   Social Connection and Isolation Panel    Frequency of Communication with Friends and Family: Never    Frequency of Social Gatherings with Friends and Family: More than three times a week    Attends Religious Services: Never    Database Administrator or Organizations: No    Attends Banker Meetings: Never    Marital Status: Widowed  Intimate Partner Violence: Not At Risk (10/18/2023)   Humiliation, Afraid, Rape, and Kick questionnaire    Fear of Current or Ex-Partner: No    Emotionally Abused: No    Physically Abused: No    Sexually Abused: No  Depression (PHQ2-9): High Risk (01/21/2024)   Depression (PHQ2-9)    PHQ-2 Score: 14  Alcohol  Screen: Low Risk (10/18/2023)   Alcohol  Screen    Last Alcohol  Screening Score (AUDIT): 0  Housing: Low Risk  (11/13/2024)   Received from [facility]   Epic    In the last 12 months, was there a time when you were not able to pay the mortgage or rent on time?: No    In the past 12 months, how many times have you moved where you were living?: 0    At any time in the past 12 months, were you homeless or living in a shelter (including now)?: No  Utilities: Not At Risk (11/13/2024)   Received from [facility]   Epic    In the past 12 months has the electric, gas, oil, or water company threatened to shut off services in your home?: No  Health Literacy: Adequate Health Literacy (10/18/2023)   B1300 Health Literacy    Frequency of need for help with medical instructions: Never    Family  History  Problem Relation Age of Onset   Depression Mother    Anxiety disorder Mother    Breast cancer Neg Hx     Allergies[1]  Review of Systems  All other systems reviewed and are negative.      Objective:   BP  115/66 (BP Location: Right Arm, Patient Position: Sitting, Cuff Size: Large)   Wt 164 lb 9.6 oz (74.7 kg)   BMI 32.15 kg/m   Vitals:   11/20/24 1023  BP: 115/66  Weight: 164 lb 9.6 oz (74.7 kg)  BMI (Calculated): 32.15    Physical Exam Vitals and nursing note reviewed.  Constitutional:      Appearance: Normal appearance. She is well-developed, well-groomed and overweight.  HENT:     Head: Normocephalic.  Eyes:     Extraocular Movements: Extraocular movements intact.     Conjunctiva/sclera: Conjunctivae normal.     Pupils: Pupils are equal, round, and reactive to light.  Cardiovascular:     Rate and Rhythm: Normal rate.  Pulmonary:     Effort: Pulmonary effort is normal.  Neurological:     General: No focal deficit present.     Mental Status: She is alert and oriented to person, place, and time. Mental status is at baseline.  Psychiatric:        Attention and Perception: Attention and perception normal.        Mood and Affect: Mood normal. Affect is flat.        Speech: Speech normal.        Behavior: Behavior normal. Behavior is cooperative.        Thought Content: Thought content normal.        Cognition and Memory: Cognition normal. Memory is impaired. She exhibits impaired recent memory.        Judgment: Judgment normal.      Results for orders placed or performed in visit on 11/20/24  TSH+T4F+T3Free  Result Value Ref Range   TSH 7.250 (H) 0.450 - 4.500 uIU/mL   T3, Free 1.5 (L) 2.0 - 4.4 pg/mL   Free T4 0.58 (L) 0.82 - 1.77 ng/dL  Iron, TIBC and Ferritin Panel  Result Value Ref Range   Total Iron Binding Capacity 341 250 - 450 ug/dL   UIBC 793 881 - 630 ug/dL   Iron 864 27 - 860 ug/dL   Iron Saturation 40 15 - 55 %   Ferritin 33 15 -  150 ng/mL  POCT Urinalysis Dipstick  Result Value Ref Range   Color, UA Yellow    Clarity, UA Cloudy    Glucose, UA Negative Negative   Bilirubin, UA Negative    Ketones, UA Negative    Spec Grav, UA 1.020 1.010 - 1.025   Blood, UA Trace - Lysed    pH, UA 6.0 5.0 - 8.0   Protein, UA Positive (A) Negative   Urobilinogen, UA 0.2 0.2 or 1.0 E.U./dL   Nitrite, UA Negative    Leukocytes, UA Small (1+) (A) Negative   Appearance Cloudy    Odor Yes   POC CREATINE & ALBUMIN,URINE  Result Value Ref Range   Microalbumin Ur, POC 80 mg/L   Creatinine, POC 300 mg/dL   Albumin/Creatinine Ratio, Urine, POC <30     Recent Results (from the past 2160 hours)  Basic metabolic panel     Status: Abnormal   Collection Time: 09/13/24  1:52 PM  Result Value Ref Range   Sodium 138 135 - 145 mmol/L   Potassium 3.5 3.5 - 5.1 mmol/L   Chloride 104 98 - 111 mmol/L   CO2 25 22 - 32 mmol/L   Glucose, Bld 159 (H) 70 - 99 mg/dL    Comment: Glucose reference range applies only to samples taken after fasting for at least 8 hours.  BUN 10 8 - 23 mg/dL   Creatinine, Ser 9.28 0.44 - 1.00 mg/dL   Calcium  8.9 8.9 - 10.3 mg/dL   GFR, Estimated >39 >39 mL/min    Comment: (NOTE) Calculated using the CKD-EPI Creatinine Equation (2021)    Anion gap 9 5 - 15    Comment: Performed at [facility], [address] [place], [place] [address]  CBC     Status: None   Collection Time: 09/13/24  1:52 PM  Result Value Ref Range   WBC 6.8 4.0 - 10.5 K/uL   RBC 4.63 3.87 - 5.11 MIL/uL   Hemoglobin 13.3 12.0 - 15.0 g/dL   HCT 60.0 63.9 - 53.9 %   MCV 86.2 80.0 - 100.0 fL   MCH 28.7 26.0 - 34.0 pg   MCHC 33.3 30.0 - 36.0 g/dL   RDW 87.2 88.4 - 84.4 %   Platelets 246 150 - 400 K/uL   nRBC 0.0 0.0 - 0.2 %    Comment: Performed at [facility], [address], [place], [place] [address]  Troponin I (High Sensitivity)     Status: None   Collection Time: 09/13/24  1:52 PM  Result Value Ref Range    Troponin I (High Sensitivity) 3 <18 ng/L    Comment: (NOTE) Elevated high sensitivity troponin I (hsTnI) values and significant  changes across serial measurements may suggest ACS but many other  chronic and acute conditions are known to elevate hsTnI results.  Refer to the Links section for chest pain algorithms and additional  guidance. Performed at [facility], [address] [place], [place] [address]   Protime-INR (order if Patient is taking Coumadin / Warfarin)     Status: None   Collection Time: 09/13/24  1:52 PM  Result Value Ref Range   Prothrombin Time 15.2 11.4 - 15.2 seconds   INR 1.1 0.8 - 1.2    Comment: (NOTE) INR goal varies based on device and disease states. Performed at [facility], [address] [place], [place] [address]   Troponin I (High Sensitivity)     Status: None   Collection Time: 09/13/24  3:47 PM  Result Value Ref Range   Troponin I (High Sensitivity) 3 <18 ng/L    Comment: (NOTE) Elevated high sensitivity troponin I (hsTnI) values and significant  changes across serial measurements may suggest ACS but many other  chronic and acute conditions are known to elevate hsTnI results.  Refer to the Links section for chest pain algorithms and additional  guidance. Performed at [facility], [address] [place], [place] [address]   Basic metabolic panel with GFR     Status: Abnormal   Collection Time: 09/25/24 10:03 AM  Result Value Ref Range   Glucose 97 70 - 99 mg/dL   BUN 7 (L) 8 - 27 mg/dL   Creatinine, Ser 9.31 0.57 - 1.00 mg/dL   eGFR 96 >40 fO/fpw/8.26   BUN/Creatinine Ratio 10 (L) 12 - 28   Sodium 141 134 - 144 mmol/L   Potassium 4.7 3.5 - 5.2 mmol/L   Chloride 102 96 - 106 mmol/L   CO2 23 20 - 29 mmol/L   Calcium  9.3 8.7 - 10.3 mg/dL  CBC with Differential/Platelet     Status: None   Collection Time: 09/25/24 10:03 AM  Result Value Ref Range   WBC 5.2 3.4 - 10.8 x10E3/uL   RBC 4.13 3.77 - 5.28  x10E6/uL   Hemoglobin 12.7 11.1 - 15.9  g/dL   Hematocrit 61.0 65.9 - 46.6 %   MCV 94 79 - 97 fL   MCH 30.8 26.6 - 33.0 pg   MCHC 32.6 31.5 - 35.7 g/dL   RDW 85.7 88.2 - 84.5 %   Platelets 288 150 - 450 x10E3/uL   Neutrophils 61 Not Estab. %   Lymphs 24 Not Estab. %   Monocytes 10 Not Estab. %   Eos 4 Not Estab. %   Basos 1 Not Estab. %   Neutrophils Absolute 3.2 1.4 - 7.0 x10E3/uL   Lymphocytes Absolute 1.2 0.7 - 3.1 x10E3/uL   Monocytes Absolute 0.5 0.1 - 0.9 x10E3/uL   EOS (ABSOLUTE) 0.2 0.0 - 0.4 x10E3/uL   Basophils Absolute 0.0 0.0 - 0.2 x10E3/uL   Immature Granulocytes 0 Not Estab. %   Immature Grans (Abs) 0.0 0.0 - 0.1 x10E3/uL  CMP14+EGFR     Status: Abnormal   Collection Time: 10/23/24 12:08 PM  Result Value Ref Range   Glucose 123 (H) 70 - 99 mg/dL   BUN 10 8 - 27 mg/dL   Creatinine, Ser 9.06 0.57 - 1.00 mg/dL   eGFR 68 >40 fO/fpw/8.26   BUN/Creatinine Ratio 11 (L) 12 - 28   Sodium 141 134 - 144 mmol/L   Potassium 4.3 3.5 - 5.2 mmol/L   Chloride 103 96 - 106 mmol/L   CO2 24 20 - 29 mmol/L   Calcium  8.9 8.7 - 10.3 mg/dL   Total Protein 6.4 6.0 - 8.5 g/dL   Albumin 4.2 3.9 - 4.9 g/dL   Globulin, Total 2.2 1.5 - 4.5 g/dL   Bilirubin Total 0.5 0.0 - 1.2 mg/dL   Alkaline Phosphatase 92 49 - 135 IU/L   AST 22 0 - 40 IU/L   ALT 13 0 - 32 IU/L  Lipid Profile     Status: Abnormal   Collection Time: 10/23/24 12:08 PM  Result Value Ref Range   Cholesterol, Total 99 (L) 100 - 199 mg/dL   Triglycerides 862 0 - 149 mg/dL   HDL 41 >60 mg/dL   VLDL Cholesterol Cal 24 5 - 40 mg/dL   LDL Chol Calc (NIH) 34 0 - 99 mg/dL   Chol/HDL Ratio 2.4 0.0 - 4.4 ratio    Comment:                                   T. Chol/HDL Ratio                                             Men  Women                               1/2 Avg.Risk  3.4    3.3                                   Avg.Risk  5.0    4.4                                2X Avg.Risk  9.6    7.1  3X  Avg.Risk 23.4   11.0   TSH     Status: Abnormal   Collection Time: 10/23/24 12:08 PM  Result Value Ref Range   TSH 0.062 (L) 0.450 - 4.500 uIU/mL  Hemoglobin A1c     Status: Abnormal   Collection Time: 10/23/24 12:08 PM  Result Value Ref Range   Hgb A1c MFr Bld 6.3 (H) 4.8 - 5.6 %    Comment:          Prediabetes: 5.7 - 6.4          Diabetes: >6.4          Glycemic control for adults with diabetes: <7.0    Est. average glucose Bld gHb Est-mCnc 134 mg/dL  UDY+[id number]     Status: Abnormal   Collection Time: 11/20/24 10:55 AM  Result Value Ref Range   TSH 7.250 (H) 0.450 - 4.500 uIU/mL   T3, Free 1.5 (L) 2.0 - 4.4 pg/mL   Free T4 0.58 (L) 0.82 - 1.77 ng/dL  Iron, TIBC and Ferritin Panel     Status: None   Collection Time: 11/20/24 10:56 AM  Result Value Ref Range   Total Iron Binding Capacity 341 250 - 450 ug/dL   UIBC 793 881 - 630 ug/dL   Iron 864 27 - 860 ug/dL   Iron Saturation 40 15 - 55 %   Ferritin 33 15 - 150 ng/mL  POCT Urinalysis Dipstick     Status: Abnormal   Collection Time: 11/22/24 10:06 AM  Result Value Ref Range   Color, UA Yellow    Clarity, UA Cloudy    Glucose, UA Negative Negative   Bilirubin, UA Negative    Ketones, UA Negative    Spec Grav, UA 1.020 1.010 - 1.025   Blood, UA Trace - Lysed    pH, UA 6.0 5.0 - 8.0   Protein, UA Positive (A) Negative    Comment: 30mg /dL   Urobilinogen, UA 0.2 0.2 or 1.0 E.U./dL   Nitrite, UA Negative    Leukocytes, UA Small (1+) (A) Negative   Appearance Cloudy    Odor Yes   POC CREATINE & ALBUMIN,URINE     Status: Normal   Collection Time: 11/22/24 10:06 AM  Result Value Ref Range   Microalbumin Ur, POC 80 mg/L   Creatinine, POC 300 mg/dL   Albumin/Creatinine Ratio, Urine, POC <30        Assessment & Plan 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  She is well controlled with current therapy.   Will defer to them for further  changes to plan of care.  Chronic diastolic congestive heart failure [facility]) Patient is seen by cardiology, who manage this condition.  She is well controlled with current therapy.   Will defer to them for further changes to plan of care.  MDD (major depressive disorder), recurrent episode, moderate (HCC) Patient stable.  Well controlled with current therapy.   Continue current meds.   Diabetic polyneuropathy associated with type 2 diabetes mellitus (HCC) Dysuria ACR in office today WNL.  Will call if she continues having urinary issues.  Sending UA for culture gien small LE level in urine.   Acquired hypothyroidism Other fatigue Checking TSH, T3 and T4 today.  Will call with results when available and decide on dosing at that time.    Return in about 1 month (around 12/21/2024).   Total time spent: 20 minutes  Clennon Nasca CHRISTELLA ARRANT, FNP  11/20/2024   This document may have been prepared by Dragon Voice Recognition software and as such may include unintentional dictation errors.        [1]  Allergies Allergen Reactions   Codeine Nausea Only   "

## 2024-11-22 NOTE — Assessment & Plan Note (Signed)
Patient is seen by cardiology, who manage this condition.  She is well controlled with current therapy.   Will defer to them for further changes to plan of care.

## 2024-11-22 NOTE — Assessment & Plan Note (Signed)
 Patient stable.  Well controlled with current therapy.   Continue current meds.

## 2024-11-22 NOTE — Assessment & Plan Note (Addendum)
 Checking TSH, T3 and T4 today.  Will call with results when available and decide on dosing at that time.

## 2024-11-23 ENCOUNTER — Telehealth: Payer: Self-pay | Admitting: Family

## 2024-11-23 NOTE — Telephone Encounter (Signed)
 Patient left VM wanting the results of her UA from when she was in office on Friday. States she feels like she has an UTI and wants an antibiotic.

## 2024-11-26 ENCOUNTER — Ambulatory Visit: Payer: Self-pay

## 2024-11-26 NOTE — Telephone Encounter (Signed)
 Per Alan vebral okay to complete per the lab results, message will be sent via that

## 2024-11-27 ENCOUNTER — Other Ambulatory Visit: Payer: Self-pay

## 2024-11-27 MED ORDER — LEVOTHYROXINE SODIUM 50 MCG PO TABS: 50.0000 ug | ORAL_TABLET | Freq: Every day | ORAL | 11 refills | Status: AC

## 2024-11-28 ENCOUNTER — Other Ambulatory Visit: Payer: Self-pay

## 2024-11-28 ENCOUNTER — Emergency Department
Admission: EM | Admit: 2024-11-28 | Discharge: 2024-11-28 | Disposition: A | Discharge status: Home / Self Care | Attending: Emergency Medicine | Admitting: Emergency Medicine

## 2024-11-28 ENCOUNTER — Emergency Department

## 2024-11-28 DIAGNOSIS — Z7901 Long term (current) use of anticoagulants: Secondary | ICD-10-CM | POA: Diagnosis not present

## 2024-11-28 DIAGNOSIS — N3000 Acute cystitis without hematuria: Secondary | ICD-10-CM | POA: Insufficient documentation

## 2024-11-28 DIAGNOSIS — I4891 Unspecified atrial fibrillation: Secondary | ICD-10-CM | POA: Insufficient documentation

## 2024-11-28 DIAGNOSIS — M545 Low back pain, unspecified: Secondary | ICD-10-CM | POA: Insufficient documentation

## 2024-11-28 DIAGNOSIS — R7401 Elevation of levels of liver transaminase levels: Secondary | ICD-10-CM | POA: Insufficient documentation

## 2024-11-28 LAB — URINALYSIS, ROUTINE W REFLEX MICROSCOPIC
Bilirubin Urine: NEGATIVE
Glucose, UA: NEGATIVE mg/dL
Hgb urine dipstick: NEGATIVE
Ketones, ur: 5 mg/dL — AB
Nitrite: NEGATIVE
Protein, ur: 30 mg/dL — AB
Specific Gravity, Urine: 1.024 (ref 1.005–1.030)
WBC, UA: >50 WBC/hpf (ref 0–5)
pH: 5 (ref 5.0–8.0)

## 2024-11-28 LAB — CBC WITH DIFFERENTIAL/PLATELET
Abs Immature Granulocytes: 0.02 K/uL (ref 0.00–0.07)
Basophils Absolute: 0.1 K/uL (ref 0.0–0.1)
Basophils Relative: 1 %
Eosinophils Absolute: 0.2 K/uL (ref 0.0–0.5)
Eosinophils Relative: 2 %
HCT: 45.3 % (ref 36.0–46.0)
Hemoglobin: 15.1 g/dL — ABNORMAL HIGH (ref 12.0–15.0)
Immature Granulocytes: 0 %
Lymphocytes Relative: 26 %
Lymphs Abs: 2.3 K/uL (ref 0.7–4.0)
MCH: 29.3 pg (ref 26.0–34.0)
MCHC: 33.3 g/dL (ref 30.0–36.0)
MCV: 88 fL (ref 80.0–100.0)
Monocytes Absolute: 0.7 K/uL (ref 0.1–1.0)
Monocytes Relative: 8 %
Neutro Abs: 5.5 K/uL (ref 1.7–7.7)
Neutrophils Relative %: 63 %
Platelets: 330 K/uL (ref 150–400)
RBC: 5.15 MIL/uL — ABNORMAL HIGH (ref 3.87–5.11)
RDW: 12.6 % (ref 11.5–15.5)
WBC: 8.7 K/uL (ref 4.0–10.5)
nRBC: 0 % (ref 0.0–0.2)

## 2024-11-28 LAB — COMPREHENSIVE METABOLIC PANEL WITH GFR
ALT: 32 U/L (ref 0–44)
AST: 51 U/L — ABNORMAL HIGH (ref 15–41)
Albumin: 4.4 g/dL (ref 3.5–5.0)
Alkaline Phosphatase: 118 U/L (ref 38–126)
Anion gap: 9 (ref 5–15)
BUN: 20 mg/dL (ref 8–23)
CO2: 27 mmol/L (ref 22–32)
Calcium: 9.5 mg/dL (ref 8.9–10.3)
Chloride: 99 mmol/L (ref 98–111)
Creatinine, Ser: 1.05 mg/dL — ABNORMAL HIGH (ref 0.44–1.00)
GFR, Estimated: 58 mL/min — ABNORMAL LOW
Glucose, Bld: 76 mg/dL (ref 70–99)
Potassium: 4.5 mmol/L (ref 3.5–5.1)
Sodium: 135 mmol/L (ref 135–145)
Total Bilirubin: 1 mg/dL (ref 0.0–1.2)
Total Protein: 7.4 g/dL (ref 6.5–8.1)

## 2024-11-28 LAB — LIPASE, BLOOD: Lipase: 69 U/L — ABNORMAL HIGH (ref 11–51)

## 2024-11-28 MED ORDER — LIDOCAINE 5 % EX PTCH
1.0000 | MEDICATED_PATCH | CUTANEOUS | Status: DC
  Administered 2024-11-28: 1 via TRANSDERMAL
  Filled 2024-11-28: qty 1

## 2024-11-28 MED ORDER — SODIUM CHLORIDE 0.9 % IV SOLN
2.0000 g | Freq: Once | INTRAVENOUS | Status: AC
  Administered 2024-11-28: 2 g via INTRAVENOUS
  Filled 2024-11-28: qty 20

## 2024-11-28 MED ORDER — CEPHALEXIN 500 MG PO CAPS: 500.0000 mg | ORAL_CAPSULE | Freq: Three times a day (TID) | ORAL | 0 refills | Status: AC

## 2024-11-28 MED ORDER — IOHEXOL 300 MG/ML  SOLN
100.0000 mL | Freq: Once | INTRAMUSCULAR | Status: AC | PRN
  Administered 2024-11-28: 100 mL via INTRAVENOUS

## 2024-11-28 NOTE — Discharge Instructions (Addendum)
 You could schedule a follow-up appointment either at the [facility] clinic or [facility] has a walk-in clinic.  Your CT imaging is as below  I prescribe some antibiotics for possible UTI  Return to ER if develop fevers, weakness in 1 leg, numbness in 1 leg, a worsening symptoms or any other concerns  IMPRESSION:  1. No acute findings in the abdomen or pelvis.  2. Status post cholecystectomy with mild biliary ductal dilatation consistent  with post cholecystectomy physiology.  3. Sigmoid diverticulosis.  4. Small hiatal hernia.

## 2024-11-28 NOTE — ED Provider Notes (Signed)
 "  [facility] Provider Note    Event Date/Time   First MD Initiated Contact with Patient 11/28/24 1134     (approximate)   History   Back Pain   HPI  Evelyn Bentley is a 69 y.o. female with A-fib on Eliquis  who comes in with lower back pain.  Patient reports pain in her lower back worse with movements occasionally wrapping around into her stomach.  That she states has been ongoing for about a week and she has been taking some oxycodone  without any relief in symptoms.  She denies any pain in her upper abdomen, chest pain, shortness of breath.  She states that she has no numbness, weakness, tingling in the legs.  She denies any saddle anesthesia, urinary or rectal incontinence, leg weakness or any other concerns.  Does report maybe a little bit of urinary symptoms.  Does report history of kidney stones.     Physical Exam   Triage Vital Signs: ED Triage Vitals  Encounter Vitals Group     BP 11/28/24 1131 119/74     Girls Systolic BP Percentile --      Girls Diastolic BP Percentile --      Boys Systolic BP Percentile --      Boys Diastolic BP Percentile --      Pulse Rate 11/28/24 1131 72     Resp 11/28/24 1131 16     Temp 11/28/24 1131 97.6 F (36.4 C)     Temp Source 11/28/24 1131 Oral     SpO2 11/28/24 1131 98 %     Weight 11/28/24 1131 164 lb 7.4 oz (74.6 kg)     Height --      Head Circumference --      Peak Flow --      Pain Score 11/28/24 1130 6     Pain Loc --      Pain Education --      Exclude from Growth Chart --     Most recent vital signs: Vitals:   11/28/24 1131 11/28/24 1143  BP: 119/74   Pulse: 72   Resp: 16   Temp: 97.6 F (36.4 C)   SpO2: 98% 98%     General: Awake, no distress.  CV:  Good peripheral perfusion.  Resp:  Normal effort.  Abd:  No distention.  Other:  No obvious CTL spine tenderness.  Equal strength in both legs.  Sensation is intact.  No significant tenderness in the lower abdomen.  No rebound, no  guarding.   ED Results / Procedures / Treatments   Labs (all labs ordered are listed, but only abnormal results are displayed) Labs Reviewed  CBC WITH DIFFERENTIAL/PLATELET - Abnormal; Notable for the following components:      Result Value   RBC 5.15 (*)    Hemoglobin 15.1 (*)    All other components within normal limits  COMPREHENSIVE METABOLIC PANEL WITH GFR - Abnormal; Notable for the following components:   Creatinine, Ser 1.05 (*)    AST 51 (*)    GFR, Estimated 58 (*)    All other components within normal limits  LIPASE, BLOOD - Abnormal; Notable for the following components:   Lipase 69 (*)    All other components within normal limits  URINALYSIS, ROUTINE W REFLEX MICROSCOPIC - Abnormal; Notable for the following components:   Color, Urine AMBER (*)    APPearance CLOUDY (*)    Ketones, ur 5 (*)    Protein, ur 30 (*)  Leukocytes,Ua MODERATE (*)    Bacteria, UA MANY (*)    All other components within normal limits  URINE CULTURE      RADIOLOGY I have reviewed the CT personally and interpreted  no obvious kidney stone   PROCEDURES:  Critical Care performed: No  Procedures   MEDICATIONS ORDERED IN ED: Medications  lidocaine  (LIDODERM ) 5 % 1 patch (1 patch Transdermal Patch Applied 11/28/24 1241)  cefTRIAXone  (ROCEPHIN ) 2 g in sodium chloride  0.9 % 100 mL IVPB (2 g Intravenous New Bag/Given 11/28/24 1402)  iohexol  (OMNIPAQUE ) 300 MG/ML solution 100 mL (100 mLs Intravenous Contrast Given 11/28/24 1333)     IMPRESSION / MDM / ASSESSMENT AND PLAN / ED COURSE  I reviewed the triage vital signs and the nursing notes.   Patient's presentation is most consistent with acute presentation with potential threat to life or bodily function.   Patient comes in with concerns for low back pain.  Occasionally did wrap into her abdomen therefore CT imaging ordered to make sure no kidney stones, appendicitis, diverticulitis or other acute pathology.  She has got no symptoms  or signs to suggest cord compression. Will check urine evaluate for UTI.  She has no CVA tenderness to suggest pyelonephritis.  UA concerning for UTI with greater than 50 WBC, we will send for culture and give a dose of ceftriaxone .  Her CBC is reassuring her CMP is reassuring slight elevation of AST and lipase but she has got no right upper abdominal pain   1:19 PM reevaluated patient she denies any falls hitting her head.  She does continue to take Eliquis .  She does report a little bit of warmth when she urinates that felt different we discussed her urine results concerning for UTI.  Will give her a dose of ceftriaxone .   IMPRESSION:  1. No acute findings in the abdomen or pelvis.  2. Status post cholecystectomy with mild biliary ductal dilatation consistent  with post cholecystectomy physiology.  3. Sigmoid diverticulosis.  4. Small hiatal hernia.   Although patient did have a slight elevation of one of her LFTs, lipase she had no upper abdominal pain and this seems more consistent to be related to her prior cholecystectomy than choledocholithiasis.  I suspect that her pain is more likely musculoskeletal as she does reports worse with certain movements.  Her urine does incidentally also have possible UTI but she does not seem to have symptoms to suggest pyelonephritis no evidence on CT imaging no CVA tenderness no fevers no white count therefore will place on antibiotics for UTI and have her follow-up outpatient with orthopedics, did discuss with her return precautions in regards to symptoms of cord compression but at this time I have low suspicion for this and she feels comfortable with discharge home.  She declines lidocaine  patches for home and already has narcotics that she can take at home.       FINAL CLINICAL IMPRESSION(S) / ED DIAGNOSES   Final diagnoses:  Acute low back pain, unspecified back pain laterality, unspecified whether sciatica present  Acute cystitis without  hematuria     Rx / DC Orders   ED Discharge Orders          Ordered    cephALEXin  (KEFLEX ) 500 MG capsule  3 times daily        11/28/24 1433             Note:  This document was prepared using Dragon voice recognition software and may include unintentional dictation  errors.   Ernest Ronal BRAVO, MD 11/28/24 1434  "

## 2024-11-28 NOTE — ED Triage Notes (Addendum)
 Arrives from [facility] for C/O lower back pain radiating toward lower abd. Patient unsure which side.. C/O symptoms x 1 week. Denies dysuria. 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

## 2024-11-30 ENCOUNTER — Other Ambulatory Visit: Payer: Self-pay | Admitting: Podiatry

## 2024-12-01 ENCOUNTER — Ambulatory Visit: Payer: Self-pay | Admitting: Podiatry

## 2024-12-01 ENCOUNTER — Ambulatory Visit: Admitting: Cardiovascular Disease

## 2024-12-01 LAB — URINE CULTURE: Culture: >=100000 — AB

## 2024-12-21 ENCOUNTER — Ambulatory Visit: Admitting: Family

## 2024-12-21 ENCOUNTER — Ambulatory Visit: Admitting: Podiatry

## 2024-12-28 ENCOUNTER — Ambulatory Visit: Admitting: Pain Medicine
